# Patient Record
Sex: Female | Born: 1940 | Race: White | Hispanic: No | Marital: Married | Smoking: Never smoker
Health system: Southern US, Community
[De-identification: ages and names within clinical notes are randomized; demographics above are authoritative.]

## PROBLEM LIST (undated history)

## (undated) DIAGNOSIS — T8859XA Other complications of anesthesia, initial encounter: Secondary | ICD-10-CM

## (undated) DIAGNOSIS — I499 Cardiac arrhythmia, unspecified: Secondary | ICD-10-CM

## (undated) DIAGNOSIS — J45909 Unspecified asthma, uncomplicated: Secondary | ICD-10-CM

## (undated) DIAGNOSIS — C3490 Malignant neoplasm of unspecified part of unspecified bronchus or lung: Secondary | ICD-10-CM

## (undated) DIAGNOSIS — I10 Essential (primary) hypertension: Secondary | ICD-10-CM

## (undated) DIAGNOSIS — I4891 Unspecified atrial fibrillation: Secondary | ICD-10-CM

## (undated) DIAGNOSIS — I509 Heart failure, unspecified: Secondary | ICD-10-CM

## (undated) DIAGNOSIS — Z9049 Acquired absence of other specified parts of digestive tract: Secondary | ICD-10-CM

## (undated) DIAGNOSIS — Z90710 Acquired absence of both cervix and uterus: Secondary | ICD-10-CM

## (undated) DIAGNOSIS — Z9989 Dependence on other enabling machines and devices: Secondary | ICD-10-CM

## (undated) DIAGNOSIS — K219 Gastro-esophageal reflux disease without esophagitis: Secondary | ICD-10-CM

## (undated) DIAGNOSIS — R413 Other amnesia: Secondary | ICD-10-CM

---

## 2004-06-09 ENCOUNTER — Ambulatory Visit: Payer: Self-pay | Admitting: Internal Medicine

## 2005-01-12 ENCOUNTER — Ambulatory Visit: Payer: Self-pay | Admitting: Internal Medicine

## 2005-01-15 ENCOUNTER — Ambulatory Visit: Payer: Self-pay | Admitting: Internal Medicine

## 2005-01-22 ENCOUNTER — Ambulatory Visit: Payer: Self-pay | Admitting: Internal Medicine

## 2005-01-29 ENCOUNTER — Ambulatory Visit: Payer: Self-pay | Admitting: Oncology

## 2005-02-05 DIAGNOSIS — C3491 Malignant neoplasm of unspecified part of right bronchus or lung: Secondary | ICD-10-CM

## 2005-02-05 HISTORY — DX: Malignant neoplasm of unspecified part of right bronchus or lung: C34.91

## 2005-02-09 ENCOUNTER — Ambulatory Visit: Payer: Self-pay | Admitting: Oncology

## 2005-02-17 ENCOUNTER — Ambulatory Visit: Payer: Self-pay | Admitting: Oncology

## 2005-03-07 ENCOUNTER — Ambulatory Visit: Payer: Self-pay | Admitting: Oncology

## 2005-04-07 ENCOUNTER — Ambulatory Visit: Payer: Self-pay | Admitting: Oncology

## 2005-05-07 ENCOUNTER — Ambulatory Visit: Payer: Self-pay | Admitting: Oncology

## 2005-06-07 ENCOUNTER — Ambulatory Visit: Payer: Self-pay | Admitting: Oncology

## 2005-07-02 ENCOUNTER — Ambulatory Visit: Payer: Self-pay

## 2005-07-08 ENCOUNTER — Ambulatory Visit: Payer: Self-pay | Admitting: Oncology

## 2005-09-22 ENCOUNTER — Ambulatory Visit: Payer: Self-pay | Admitting: Oncology

## 2005-10-05 ENCOUNTER — Ambulatory Visit: Payer: Self-pay | Admitting: Oncology

## 2005-12-16 ENCOUNTER — Ambulatory Visit: Payer: Self-pay | Admitting: Oncology

## 2005-12-22 ENCOUNTER — Ambulatory Visit: Payer: Self-pay | Admitting: Oncology

## 2006-01-05 ENCOUNTER — Ambulatory Visit: Payer: Self-pay | Admitting: Oncology

## 2006-04-04 ENCOUNTER — Ambulatory Visit: Payer: Self-pay | Admitting: Unknown Physician Specialty

## 2006-04-14 ENCOUNTER — Ambulatory Visit: Payer: Self-pay | Admitting: Oncology

## 2006-06-30 ENCOUNTER — Ambulatory Visit: Payer: Self-pay | Admitting: Internal Medicine

## 2006-08-29 ENCOUNTER — Ambulatory Visit: Payer: Self-pay | Admitting: Oncology

## 2006-09-06 ENCOUNTER — Ambulatory Visit: Payer: Self-pay | Admitting: Oncology

## 2006-10-06 ENCOUNTER — Ambulatory Visit: Payer: Self-pay | Admitting: Oncology

## 2006-10-10 ENCOUNTER — Ambulatory Visit: Payer: Self-pay | Admitting: Radiation Oncology

## 2006-11-06 ENCOUNTER — Ambulatory Visit: Payer: Self-pay | Admitting: Oncology

## 2006-12-29 ENCOUNTER — Ambulatory Visit: Payer: Self-pay | Admitting: Oncology

## 2007-01-06 ENCOUNTER — Ambulatory Visit: Payer: Self-pay | Admitting: Oncology

## 2007-01-18 ENCOUNTER — Ambulatory Visit: Payer: Self-pay | Admitting: Oncology

## 2007-02-06 ENCOUNTER — Ambulatory Visit: Payer: Self-pay | Admitting: Oncology

## 2007-04-08 ENCOUNTER — Ambulatory Visit: Payer: Self-pay | Admitting: Radiation Oncology

## 2007-04-12 ENCOUNTER — Ambulatory Visit: Payer: Self-pay | Admitting: Radiation Oncology

## 2007-05-08 ENCOUNTER — Ambulatory Visit: Payer: Self-pay | Admitting: Radiation Oncology

## 2007-06-08 ENCOUNTER — Ambulatory Visit: Payer: Self-pay | Admitting: Oncology

## 2007-07-04 ENCOUNTER — Ambulatory Visit: Payer: Self-pay | Admitting: Internal Medicine

## 2007-07-09 ENCOUNTER — Ambulatory Visit: Payer: Self-pay | Admitting: Oncology

## 2007-07-19 ENCOUNTER — Ambulatory Visit: Payer: Self-pay | Admitting: Oncology

## 2007-08-06 ENCOUNTER — Ambulatory Visit: Payer: Self-pay | Admitting: Oncology

## 2007-10-10 ENCOUNTER — Ambulatory Visit: Payer: Self-pay | Admitting: Radiation Oncology

## 2007-11-06 ENCOUNTER — Ambulatory Visit: Payer: Self-pay | Admitting: Radiation Oncology

## 2007-12-06 ENCOUNTER — Ambulatory Visit: Payer: Self-pay | Admitting: Oncology

## 2007-12-20 ENCOUNTER — Ambulatory Visit: Payer: Self-pay | Admitting: Oncology

## 2008-01-06 ENCOUNTER — Ambulatory Visit: Payer: Self-pay | Admitting: Oncology

## 2008-01-24 ENCOUNTER — Ambulatory Visit: Payer: Self-pay | Admitting: Oncology

## 2008-02-06 ENCOUNTER — Ambulatory Visit: Payer: Self-pay | Admitting: Oncology

## 2008-07-05 ENCOUNTER — Ambulatory Visit: Payer: Self-pay | Admitting: Internal Medicine

## 2008-07-08 ENCOUNTER — Ambulatory Visit: Payer: Self-pay | Admitting: Oncology

## 2008-07-24 ENCOUNTER — Ambulatory Visit: Payer: Self-pay | Admitting: Oncology

## 2008-08-05 ENCOUNTER — Ambulatory Visit: Payer: Self-pay | Admitting: Oncology

## 2008-10-05 ENCOUNTER — Ambulatory Visit: Payer: Self-pay | Admitting: Radiation Oncology

## 2008-10-24 ENCOUNTER — Ambulatory Visit: Payer: Self-pay | Admitting: Oncology

## 2008-11-05 ENCOUNTER — Ambulatory Visit: Payer: Self-pay | Admitting: Radiation Oncology

## 2009-07-08 ENCOUNTER — Ambulatory Visit: Payer: Self-pay | Admitting: Oncology

## 2009-07-16 ENCOUNTER — Ambulatory Visit: Payer: Self-pay | Admitting: Oncology

## 2009-07-24 ENCOUNTER — Ambulatory Visit: Payer: Self-pay | Admitting: Internal Medicine

## 2009-08-05 ENCOUNTER — Ambulatory Visit: Payer: Self-pay | Admitting: Oncology

## 2009-10-05 ENCOUNTER — Ambulatory Visit: Payer: Self-pay | Admitting: Oncology

## 2009-10-20 ENCOUNTER — Ambulatory Visit: Payer: Self-pay | Admitting: Oncology

## 2009-11-05 ENCOUNTER — Ambulatory Visit: Payer: Self-pay | Admitting: Oncology

## 2009-11-13 ENCOUNTER — Ambulatory Visit: Payer: Self-pay | Admitting: Oncology

## 2009-11-17 ENCOUNTER — Ambulatory Visit: Payer: Self-pay | Admitting: Oncology

## 2009-12-05 ENCOUNTER — Ambulatory Visit: Payer: Self-pay | Admitting: Oncology

## 2010-08-06 ENCOUNTER — Ambulatory Visit: Payer: Self-pay | Admitting: Internal Medicine

## 2010-11-20 ENCOUNTER — Ambulatory Visit: Payer: Self-pay | Admitting: Oncology

## 2010-12-06 ENCOUNTER — Ambulatory Visit: Payer: Self-pay | Admitting: Oncology

## 2011-08-30 ENCOUNTER — Ambulatory Visit: Payer: Self-pay | Admitting: Internal Medicine

## 2011-12-01 ENCOUNTER — Ambulatory Visit: Payer: Self-pay | Admitting: Oncology

## 2011-12-01 LAB — CBC CANCER CENTER
Basophil %: 0.4 %
Eosinophil #: 0.1 x10 3/mm (ref 0.0–0.7)
Eosinophil %: 0.9 %
HGB: 12.6 g/dL (ref 12.0–16.0)
Lymphocyte %: 12.8 %
MCH: 28.7 pg (ref 26.0–34.0)
MCHC: 33.1 g/dL (ref 32.0–36.0)
MCV: 87 fL (ref 80–100)
Monocyte #: 0.4 x10 3/mm (ref 0.2–0.9)
Neutrophil #: 5.7 x10 3/mm (ref 1.4–6.5)
Neutrophil %: 80.1 %
RBC: 4.4 10*6/uL (ref 3.80–5.20)
RDW: 14 % (ref 11.5–14.5)
WBC: 7.1 x10 3/mm (ref 3.6–11.0)

## 2011-12-01 LAB — COMPREHENSIVE METABOLIC PANEL
Albumin: 3.8 g/dL (ref 3.4–5.0)
Alkaline Phosphatase: 77 U/L (ref 50–136)
Anion Gap: 9 (ref 7–16)
BUN: 10 mg/dL (ref 7–18)
Bilirubin,Total: 1.1 mg/dL — ABNORMAL HIGH (ref 0.2–1.0)
Calcium, Total: 9.1 mg/dL (ref 8.5–10.1)
EGFR (Non-African Amer.): >60
Glucose: 103 mg/dL — ABNORMAL HIGH (ref 65–99)
Potassium: 4.2 mmol/L (ref 3.5–5.1)
SGOT(AST): 14 U/L — ABNORMAL LOW (ref 15–37)
SGPT (ALT): 20 U/L
Sodium: 140 mmol/L (ref 136–145)
Total Protein: 6.9 g/dL (ref 6.4–8.2)

## 2011-12-06 ENCOUNTER — Ambulatory Visit: Payer: Self-pay | Admitting: Oncology

## 2012-01-06 ENCOUNTER — Ambulatory Visit: Payer: Self-pay | Admitting: Oncology

## 2012-05-01 ENCOUNTER — Ambulatory Visit: Payer: Self-pay | Admitting: Unknown Physician Specialty

## 2012-05-03 LAB — PATHOLOGY REPORT

## 2012-08-30 ENCOUNTER — Ambulatory Visit: Payer: Self-pay

## 2012-12-05 ENCOUNTER — Ambulatory Visit: Payer: Self-pay | Admitting: Oncology

## 2012-12-20 LAB — COMPREHENSIVE METABOLIC PANEL
Alkaline Phosphatase: 77 U/L (ref 50–136)
BUN: 14 mg/dL (ref 7–18)
Calcium, Total: 9 mg/dL (ref 8.5–10.1)
Chloride: 104 mmol/L (ref 98–107)
Co2: 31 mmol/L (ref 21–32)
EGFR (African American): >60
Glucose: 105 mg/dL — ABNORMAL HIGH (ref 65–99)
SGOT(AST): 16 U/L (ref 15–37)
SGPT (ALT): 18 U/L (ref 12–78)
Sodium: 138 mmol/L (ref 136–145)

## 2012-12-20 LAB — CBC CANCER CENTER
Basophil #: 0 x10 3/mm (ref 0.0–0.1)
Basophil %: 0.5 %
Eosinophil %: 0.7 %
HCT: 37.1 % (ref 35.0–47.0)
Lymphocyte #: 0.9 x10 3/mm — ABNORMAL LOW (ref 1.0–3.6)
Lymphocyte %: 9.2 %
MCH: 29.2 pg (ref 26.0–34.0)
MCHC: 34.6 g/dL (ref 32.0–36.0)
Monocyte %: 5.9 %
Neutrophil %: 83.7 %
Platelet: 251 x10 3/mm (ref 150–440)
RBC: 4.38 10*6/uL (ref 3.80–5.20)
RDW: 14.6 % — ABNORMAL HIGH (ref 11.5–14.5)

## 2013-01-05 ENCOUNTER — Ambulatory Visit: Payer: Self-pay | Admitting: Family Medicine

## 2013-01-05 ENCOUNTER — Ambulatory Visit: Payer: Self-pay | Admitting: Oncology

## 2013-09-06 ENCOUNTER — Ambulatory Visit: Payer: Self-pay

## 2013-10-30 DIAGNOSIS — R0681 Apnea, not elsewhere classified: Secondary | ICD-10-CM | POA: Insufficient documentation

## 2013-11-01 DIAGNOSIS — R0602 Shortness of breath: Secondary | ICD-10-CM | POA: Insufficient documentation

## 2013-11-20 DIAGNOSIS — R05 Cough: Secondary | ICD-10-CM | POA: Insufficient documentation

## 2013-11-20 DIAGNOSIS — R059 Cough, unspecified: Secondary | ICD-10-CM | POA: Insufficient documentation

## 2013-11-26 DIAGNOSIS — I340 Nonrheumatic mitral (valve) insufficiency: Secondary | ICD-10-CM | POA: Insufficient documentation

## 2014-01-09 ENCOUNTER — Ambulatory Visit: Payer: Self-pay | Admitting: Oncology

## 2014-01-09 LAB — COMPREHENSIVE METABOLIC PANEL
ALBUMIN: 3.6 g/dL (ref 3.4–5.0)
ALK PHOS: 76 U/L
Anion Gap: 5 — ABNORMAL LOW (ref 7–16)
BUN: 12 mg/dL (ref 7–18)
Bilirubin,Total: 1.2 mg/dL — ABNORMAL HIGH (ref 0.2–1.0)
Calcium, Total: 9.1 mg/dL (ref 8.5–10.1)
Chloride: 100 mmol/L (ref 98–107)
Co2: 31 mmol/L (ref 21–32)
Creatinine: 0.85 mg/dL (ref 0.60–1.30)
EGFR (Non-African Amer.): >60
GLUCOSE: 114 mg/dL — AB (ref 65–99)
Osmolality: 273 (ref 275–301)
Potassium: 4.4 mmol/L (ref 3.5–5.1)
SGOT(AST): 24 U/L (ref 15–37)
SGPT (ALT): 22 U/L
Sodium: 136 mmol/L (ref 136–145)
TOTAL PROTEIN: 7.4 g/dL (ref 6.4–8.2)

## 2014-01-09 LAB — CBC CANCER CENTER
Basophil #: 0.1 x10 3/mm (ref 0.0–0.1)
Basophil %: 0.7 %
Eosinophil #: 0.1 x10 3/mm (ref 0.0–0.7)
Eosinophil %: 0.7 %
HCT: 42.2 % (ref 35.0–47.0)
HGB: 14.1 g/dL (ref 12.0–16.0)
LYMPHS ABS: 1 x10 3/mm (ref 1.0–3.6)
Lymphocyte %: 10.8 %
MCH: 27.9 pg (ref 26.0–34.0)
MCHC: 33.5 g/dL (ref 32.0–36.0)
MCV: 84 fL (ref 80–100)
MONO ABS: 0.5 x10 3/mm (ref 0.2–0.9)
MONOS PCT: 5.8 %
NEUTROS PCT: 82 %
Neutrophil #: 7.4 x10 3/mm — ABNORMAL HIGH (ref 1.4–6.5)
Platelet: 251 x10 3/mm (ref 150–440)
RBC: 5.05 10*6/uL (ref 3.80–5.20)
RDW: 15.1 % — ABNORMAL HIGH (ref 11.5–14.5)
WBC: 9 x10 3/mm (ref 3.6–11.0)

## 2014-02-05 ENCOUNTER — Ambulatory Visit: Payer: Self-pay | Admitting: Oncology

## 2014-05-20 DIAGNOSIS — I482 Chronic atrial fibrillation, unspecified: Secondary | ICD-10-CM | POA: Insufficient documentation

## 2014-05-20 DIAGNOSIS — E782 Mixed hyperlipidemia: Secondary | ICD-10-CM | POA: Insufficient documentation

## 2014-09-12 ENCOUNTER — Ambulatory Visit
Admit: 2014-09-12 | Disposition: A | Discharge status: Home / Self Care | Payer: Self-pay | Attending: Infectious Diseases | Admitting: Infectious Diseases

## 2015-01-06 DIAGNOSIS — I10 Essential (primary) hypertension: Secondary | ICD-10-CM | POA: Insufficient documentation

## 2015-01-13 ENCOUNTER — Other Ambulatory Visit: Payer: Self-pay | Admitting: *Deleted

## 2015-01-13 ENCOUNTER — Ambulatory Visit
Admission: RE | Admit: 2015-01-13 | Discharge: 2015-01-13 | Disposition: A | Discharge status: Home / Self Care | Payer: Medicare Other | Source: Ambulatory Visit | Attending: Oncology | Admitting: Oncology

## 2015-01-13 ENCOUNTER — Inpatient Hospital Stay (HOSPITAL_BASED_OUTPATIENT_CLINIC_OR_DEPARTMENT_OTHER): Payer: Medicare Other | Admitting: Oncology

## 2015-01-13 ENCOUNTER — Inpatient Hospital Stay: Payer: Medicare Other | Attending: Oncology

## 2015-01-13 ENCOUNTER — Encounter: Payer: Self-pay | Admitting: Oncology

## 2015-01-13 VITALS — BP 112/79 | HR 60 | Temp 96.5°F | Wt 169.3 lb

## 2015-01-13 DIAGNOSIS — Z85118 Personal history of other malignant neoplasm of bronchus and lung: Secondary | ICD-10-CM | POA: Diagnosis not present

## 2015-01-13 DIAGNOSIS — C3490 Malignant neoplasm of unspecified part of unspecified bronchus or lung: Secondary | ICD-10-CM

## 2015-01-13 DIAGNOSIS — F329 Major depressive disorder, single episode, unspecified: Secondary | ICD-10-CM | POA: Insufficient documentation

## 2015-01-13 DIAGNOSIS — E785 Hyperlipidemia, unspecified: Secondary | ICD-10-CM | POA: Diagnosis not present

## 2015-01-13 DIAGNOSIS — F32A Depression, unspecified: Secondary | ICD-10-CM | POA: Insufficient documentation

## 2015-01-13 DIAGNOSIS — C3411 Malignant neoplasm of upper lobe, right bronchus or lung: Secondary | ICD-10-CM | POA: Diagnosis present

## 2015-01-13 DIAGNOSIS — R7303 Prediabetes: Secondary | ICD-10-CM | POA: Insufficient documentation

## 2015-01-13 DIAGNOSIS — Z79899 Other long term (current) drug therapy: Secondary | ICD-10-CM | POA: Insufficient documentation

## 2015-01-13 DIAGNOSIS — I4891 Unspecified atrial fibrillation: Secondary | ICD-10-CM | POA: Insufficient documentation

## 2015-01-13 DIAGNOSIS — I10 Essential (primary) hypertension: Secondary | ICD-10-CM | POA: Diagnosis not present

## 2015-01-13 DIAGNOSIS — F419 Anxiety disorder, unspecified: Secondary | ICD-10-CM | POA: Insufficient documentation

## 2015-01-13 DIAGNOSIS — I517 Cardiomegaly: Secondary | ICD-10-CM | POA: Diagnosis not present

## 2015-01-13 DIAGNOSIS — Z923 Personal history of irradiation: Secondary | ICD-10-CM | POA: Diagnosis not present

## 2015-01-13 DIAGNOSIS — R911 Solitary pulmonary nodule: Secondary | ICD-10-CM | POA: Insufficient documentation

## 2015-01-13 DIAGNOSIS — Z08 Encounter for follow-up examination after completed treatment for malignant neoplasm: Secondary | ICD-10-CM | POA: Diagnosis not present

## 2015-01-13 LAB — COMPREHENSIVE METABOLIC PANEL
ALK PHOS: 72 U/L (ref 38–126)
ALT: 12 U/L — AB (ref 14–54)
ANION GAP: 6 (ref 5–15)
AST: 18 U/L (ref 15–41)
Albumin: 4.1 g/dL (ref 3.5–5.0)
BUN: 15 mg/dL (ref 6–20)
CALCIUM: 8.6 mg/dL — AB (ref 8.9–10.3)
CO2: 26 mmol/L (ref 22–32)
Chloride: 100 mmol/L — ABNORMAL LOW (ref 101–111)
Creatinine, Ser: 0.74 mg/dL (ref 0.44–1.00)
GFR calc Af Amer: >60 mL/min (ref >60)
Glucose, Bld: 109 mg/dL — ABNORMAL HIGH (ref 65–99)
Potassium: 4.1 mmol/L (ref 3.5–5.1)
SODIUM: 132 mmol/L — AB (ref 135–145)
Total Bilirubin: 0.8 mg/dL (ref 0.3–1.2)
Total Protein: 6.8 g/dL (ref 6.5–8.1)

## 2015-01-13 LAB — CBC WITH DIFFERENTIAL/PLATELET
Basophils Absolute: 0 10*3/uL (ref 0–0.1)
Basophils Relative: 1 %
EOS PCT: 1 %
Eosinophils Absolute: 0.1 10*3/uL (ref 0–0.7)
HCT: 38.1 % (ref 35.0–47.0)
Hemoglobin: 13.1 g/dL (ref 12.0–16.0)
Lymphocytes Relative: 9 %
Lymphs Abs: 0.8 10*3/uL — ABNORMAL LOW (ref 1.0–3.6)
MCH: 28.8 pg (ref 26.0–34.0)
MCHC: 34.4 g/dL (ref 32.0–36.0)
MCV: 83.6 fL (ref 80.0–100.0)
Monocytes Absolute: 0.6 10*3/uL (ref 0.2–0.9)
Monocytes Relative: 7 %
Neutro Abs: 7.5 10*3/uL — ABNORMAL HIGH (ref 1.4–6.5)
Neutrophils Relative %: 82 %
PLATELETS: 276 10*3/uL (ref 150–440)
RBC: 4.56 MIL/uL (ref 3.80–5.20)
RDW: 14.6 % — ABNORMAL HIGH (ref 11.5–14.5)
WBC: 9.1 10*3/uL (ref 3.6–11.0)

## 2015-01-13 NOTE — Progress Notes (Signed)
Patient does not have living will. Never smoked. Patient here for CXR results.

## 2015-01-17 ENCOUNTER — Ambulatory Visit
Admission: RE | Admit: 2015-01-17 | Discharge: 2015-01-17 | Disposition: A | Discharge status: Home / Self Care | Payer: Medicare Other | Source: Ambulatory Visit | Attending: Oncology | Admitting: Oncology

## 2015-01-17 DIAGNOSIS — Z08 Encounter for follow-up examination after completed treatment for malignant neoplasm: Secondary | ICD-10-CM | POA: Diagnosis present

## 2015-01-17 DIAGNOSIS — Z85118 Personal history of other malignant neoplasm of bronchus and lung: Secondary | ICD-10-CM | POA: Diagnosis present

## 2015-01-17 DIAGNOSIS — D739 Disease of spleen, unspecified: Secondary | ICD-10-CM | POA: Diagnosis not present

## 2015-01-17 DIAGNOSIS — C3490 Malignant neoplasm of unspecified part of unspecified bronchus or lung: Secondary | ICD-10-CM

## 2015-01-17 DIAGNOSIS — R918 Other nonspecific abnormal finding of lung field: Secondary | ICD-10-CM | POA: Insufficient documentation

## 2015-01-19 NOTE — Progress Notes (Signed)
Jody [name] @ [facility] [phone]  [phone]     Jody [name] OB: [birth year]  MR#: [id number]  YYT#:[id number]  Patient Care Team: Adrian Prows, MD as PCP - General (Infectious Diseases)  CHIEF COMPLAINT:  Chief Complaint  Patient presents with  . Follow-up   Chief Complaint/Problem List:   Carcinoma of lung, status post radiation therapy. Adenocarcinoma, T1N0M0. Stage I. September 2006     INTERVAL HISTORY: 74 year old lady with a history of carcinoma of lung came today for further follow-up.  Patient had a chest x-ray done.  No cough or shortness of breath patient does not smoke patient also has bronchial asthma and recently diagnosed.  Appetite has been stable.  No nausea.  No vomiting.  No hemoptysis.  REVIEW OF SYSTEMS:  GENERAL:  Feels good.  Active.  No fevers, sweats or weight loss. PERFORMANCE STATUS (ECOG):  01 HEENT:  No visual changes, runny nose, sore throat, mouth sores or tenderness. Lungs: No shortness of breath or cough.  No hemoptysis. Cardiac:  No chest pain, palpitations, orthopnea, or PND. GI:  No nausea, vomiting, diarrhea, constipation, melena or hematochezia. GU:  No urgency, frequency, dysuria, or hematuria. Musculoskeletal:  No back pain.  No joint pain.  No muscle tenderness. Extremities:  No pain or swelling. Skin:  No rashes or skin changes. Neuro:  No headache, numbness or weakness, balance or coordination issues. Endocrine:  No diabetes, thyroid issues, hot flashes or night sweats. Psych:  No mood changes, depression or anxiety. Pain:  No focal pain. Review of systems:  All other systems reviewed and found to be negative. As per HPI. Otherwise, a complete review of systems is negatve.  Significant History/PMH:   reflux esophagitis:    anxiety depressive syndrome:    Cancer, Lung: Sep 2006   Hypertension:    Hyperlipidemia:    nissen fundoplication:   Smoking History: Smoking History Never  Smoked.(1)  PFSH: Additional Past Medical and Surgical History: Allergies:  Penicillin causing rash    Past Medical History:  Hiatal hernia  Bronchitis  Hyperlipidemia  Anxiety depressive syndrome    Past Surgical History:  Appendectomy 1970  Cholecystectomy 1980  Reflux esophagitis status post Nissen fundoplication.  Ovarian cyst surgery in the past related to sterility.    Family History:  History of breast cancer with probable metastatic disease to liver. History of heart disease.    Social History:  No alcohol use. No tobacco use. No radiation exposure. No recreational drug use.   Has never smoked in the past.   ADVANCED DIRECTIVES:  Patient does not have any living will or healthcare power of attorney.  Information was given .  Available resources had been discussed.  We will follow-up on subsequent appointments regarding this issue HEALTH MAINTENANCE: Social History  Substance Use Topics  . Smoking status: Never Smoker   . Smokeless tobacco: None  . Alcohol Use: None      Allergies  Allergen Reactions  . Extract Of [facility] Other (See Comments)    blistering  . Ampicillin Rash  . Penicillin V Potassium Rash      OBJECTIVE:  Filed Vitals:   01/13/15 1029  BP: 112/79  Pulse: 60  Temp: 96.5 F (35.8 C)     There is no height on file to calculate BMI.    ECOG FS:1 - Symptomatic but completely ambulatory  PHYSICAL EXAM: GENERAL:  Well developed, well nourished, sitting comfortably in the exam room in no acute distress. MENTAL STATUS:  Alert  and oriented to person, place and time.  ENT:  Oropharynx clear without lesion.  Tongue normal. Mucous membranes moist.  RESPIRATORY:  Clear to auscultation without rales, wheezes or rhonchi. CARDIOVASCULAR:  Regular rate and rhythm without murmur, rub or gallop. BREAST:  Right breast without masses, skin changes or nipple discharge.  Left breast without masses, skin changes or nipple discharge. ABDOMEN:  Soft,  non-tender, with active bowel sounds, and no hepatosplenomegaly.  No masses. BACK:  No CVA tenderness.  No tenderness on percussion of the back or rib cage. SKIN:  No rashes, ulcers or lesions. EXTREMITIES: No edema, no skin discoloration or tenderness.  No palpable cords. LYMPH NODES: No palpable cervical, supraclavicular, axillary or inguinal adenopathy  NEUROLOGICAL: Unremarkable. PSYCH:  Appropriate.   LAB RESULTS:  CBC Latest Ref Rng 01/13/2015 01/09/2014  WBC 3.6 - 11.0 K/uL 9.1 9.0  Hemoglobin 12.0 - 16.0 g/dL 13.1 14.1  Hematocrit 35.0 - 47.0 % 38.1 42.2  Platelets 150 - 440 K/uL 276 251    Appointment on 01/13/2015  Component Date Value Ref Range Status  . WBC 01/13/2015 9.1  3.6 - 11.0 K/uL Final  . RBC 01/13/2015 4.56  3.80 - 5.20 MIL/uL Final  . Hemoglobin 01/13/2015 13.1  12.0 - 16.0 g/dL Final  . HCT 01/13/2015 38.1  35.0 - 47.0 % Final  . MCV 01/13/2015 83.6  80.0 - 100.0 fL Final  . MCH 01/13/2015 28.8  26.0 - 34.0 pg Final  . MCHC 01/13/2015 34.4  32.0 - 36.0 g/dL Final  . RDW 01/13/2015 14.6* 11.5 - 14.5 % Final  . Platelets 01/13/2015 276  150 - 440 K/uL Final  . Neutrophils Relative % 01/13/2015 82   Final  . Neutro Abs 01/13/2015 7.5* 1.4 - 6.5 K/uL Final  . Lymphocytes Relative 01/13/2015 9   Final  . Lymphs Abs 01/13/2015 0.8* 1.0 - 3.6 K/uL Final  . Monocytes Relative 01/13/2015 7   Final  . Monocytes Absolute 01/13/2015 0.6  0.2 - 0.9 K/uL Final  . Eosinophils Relative 01/13/2015 1   Final  . Eosinophils Absolute 01/13/2015 0.1  0 - 0.7 K/uL Final  . Basophils Relative 01/13/2015 1   Final  . Basophils Absolute 01/13/2015 0.0  0 - 0.1 K/uL Final  . Sodium 01/13/2015 132* 135 - 145 mmol/L Final  . Potassium 01/13/2015 4.1  3.5 - 5.1 mmol/L Final  . Chloride 01/13/2015 100* 101 - 111 mmol/L Final  . CO2 01/13/2015 26  22 - 32 mmol/L Final  . Glucose, Bld 01/13/2015 109* 65 - 99 mg/dL Final  . BUN 01/13/2015 15  6 - 20 mg/dL Final  . Creatinine, Ser  01/13/2015 0.74  0.44 - 1.00 mg/dL Final  . Calcium 01/13/2015 8.6* 8.9 - 10.3 mg/dL Final  . Total Protein 01/13/2015 6.8  6.5 - 8.1 g/dL Final  . Albumin 01/13/2015 4.1  3.5 - 5.0 g/dL Final  . AST 01/13/2015 18  15 - 41 U/L Final  . ALT 01/13/2015 12* 14 - 54 U/L Final  . Alkaline Phosphatase 01/13/2015 72  38 - 126 U/L Final  . Total Bilirubin 01/13/2015 0.8  0.3 - 1.2 mg/dL Final  . GFR calc non Af Amer 01/13/2015 >60  >60 mL/min Final  . GFR calc Af Amer 01/13/2015 >60  >60 mL/min Final   Comment: (NOTE) The eGFR has been calculated using the CKD EPI equation. This calculation has not been validated in all clinical situations. eGFR's persistently <60 mL/min signify possible Chronic Kidney Disease.   Marland Kitchen  Anion gap 01/13/2015 6  5 - 15 Final      STUDIES: Dg Chest 2 View  01/13/2015   CLINICAL DATA:  Malignant neoplasm of lung, 10 year follow-up. Atrial fibrillation.  EXAM: CHEST  2 VIEW  COMPARISON:  01/09/2014.  07/24/2008.  CT 11/13/2009.  FINDINGS: Mediastinum and hilar structures normal. Stable cardiomegaly. Stable right apical pleural parenchymal thickening noted consistent with scarring. A nodular density noted projected over the left mid lung field. This was not present on prior chest x-ray of 01/09/2014. Nodularity was noted in this region on prior chest CT of 11/13/2009. This could represent an new a recurrent pulmonary nodule. No pleural effusion or pneumothorax. Degenerative changes thoracic spine. Prior cholecystectomy. Nonenhanced chest CT suggest for further evaluation.  IMPRESSION: 1. Stable right apical pleural thickening noted consistent with scarring. No evidence of recurrent tumor in in this region. 2. Nodular opacity noted over the left mid lung field. This was not present on prior chest x-ray of 01/09/2014. This may have been present on prior CT of 11/13/2009. This nodule could be new or recurrent. Nonenhanced chest CT is suggested for further evaluation . 3. Stable  cardiomegaly.   Electronically Signed   By: Marcello Moores  Register   On: 01/13/2015 09:48   Ct Chest Wo Contrast  01/17/2015   CLINICAL DATA:  Subsequent encounter for lung cancer.  EXAM: CT CHEST WITHOUT CONTRAST  TECHNIQUE: Multidetector CT imaging of the chest was performed following the standard protocol without IV contrast.  COMPARISON:  12/17/2011.  FINDINGS: Mediastinum / Lymph Nodes: There is no axillary lymphadenopathy. No mediastinal lymphadenopathy. There is no hilar lymphadenopathy. Heart is enlarged. Coronary artery calcification is noted. Small to moderate hiatal hernia noted.  Lungs / Pleura: Post treatment changes in the central right upper lobe are stable. Multiple scattered bilateral pulmonary parenchymal nodules are unchanged in the interval. 5 mm nodule in the superior segment of the right lower lobe is new in the interval. 2 mm right lower lobe nodule seen on image 22 is also new. No pulmonary edema. No focal airspace consolidation. No evidence for pleural effusion.  MSK / Soft Tissues: Bone windows reveal no worrisome lytic or sclerotic osseous lesions.  Upper Abdomen:  Stable appearance of large calcified splenic lesion.  IMPRESSION: 1. Stable appearance of post treatment changes in the right upper lobe. 2. Scattered tiny bilateral pulmonary parenchymal nodules. Two nodules in the right lung are new in the interval, the larger of the 2 measuring 5 mm. If the patient is at high risk for bronchogenic carcinoma, follow-up chest CT at 6-12 months is recommended. If the patient is at low risk for bronchogenic carcinoma, follow-up chest CT at 12 months is recommended. This recommendation follows the consensus statement: Guidelines for Management of Small Pulmonary Nodules Detected on CT Scans: A Statement from the [employer] as published in Radiology 2005;237:395-400. 3. Stable appearance of large calcified splenic lesion, consistent with benign process.   Electronically Signed   By: Misty Stanley M.D.   On: 01/17/2015 10:54    ASSESSMENT: Adenocarcinoma of lung status postradiation therapy Patient had an abnormal chest x-ray which is reviewed this was followed by CT scan which revealed stable changes in the right upper lobe where patient had been treated with radiation.  Scattered tiny bilateral pulmonary parenchymal nodules 2 nodules in the right lungs are new and one is measuring 5 mm. Another CT scan with contrast has been recommended in 6-12 months  MEDICAL DECISION MAKING:  CT scan  and the chest x-rays are reviewed will arrange for another CT scan in 6 months and reevaluation after that for follow-up regarding lung nodule. Right upper lobe lung cancer, adenocarcinoma stage I status postradiation therapy  Patient expressed understanding and was in agreement with this plan. She also understands that She can call clinic at any time with any questions, concerns, or complaints.    No matching staging information was found for the patient.  Forest Gleason, MD   01/19/2015 4:52 PM

## 2015-01-20 ENCOUNTER — Telehealth: Payer: Self-pay | Admitting: *Deleted

## 2015-01-20 ENCOUNTER — Other Ambulatory Visit: Payer: Self-pay | Admitting: *Deleted

## 2015-01-20 DIAGNOSIS — R911 Solitary pulmonary nodule: Secondary | ICD-10-CM

## 2015-01-20 NOTE — Telephone Encounter (Signed)
Spoke with patient to inform her that MD is scheduling another CT in 6 mos to follow up with lung nodules. Patient verbalized understanding.

## 2015-01-20 NOTE — Telephone Encounter (Signed)
-----   Message from Forest Gleason, MD sent at 01/19/2015  5:02 PM EDT ----- Regarding: Follow-up CT scan We need to call her and let her know that there is small 5 mm right upper lobe lung nodule which initially followed by another CT scan in 6 months.  Please arrange for CT scan with contrast in 6 months and then return appointment to see me after CT scan is done.  If needed I cannot talk to her

## 2015-01-21 DIAGNOSIS — I071 Rheumatic tricuspid insufficiency: Secondary | ICD-10-CM | POA: Insufficient documentation

## 2015-02-10 ENCOUNTER — Emergency Department: Payer: Medicare Other

## 2015-02-10 ENCOUNTER — Emergency Department
Admission: EM | Admit: 2015-02-10 | Discharge: 2015-02-10 | Disposition: A | Discharge status: Home / Self Care | Payer: Medicare Other | Attending: Emergency Medicine | Admitting: Emergency Medicine

## 2015-02-10 ENCOUNTER — Encounter: Payer: Self-pay | Admitting: *Deleted

## 2015-02-10 DIAGNOSIS — I10 Essential (primary) hypertension: Secondary | ICD-10-CM | POA: Diagnosis not present

## 2015-02-10 DIAGNOSIS — R5383 Other fatigue: Secondary | ICD-10-CM | POA: Diagnosis present

## 2015-02-10 DIAGNOSIS — Z79899 Other long term (current) drug therapy: Secondary | ICD-10-CM | POA: Diagnosis not present

## 2015-02-10 DIAGNOSIS — I4891 Unspecified atrial fibrillation: Secondary | ICD-10-CM | POA: Insufficient documentation

## 2015-02-10 HISTORY — DX: Unspecified atrial fibrillation: I48.91

## 2015-02-10 HISTORY — DX: Malignant neoplasm of unspecified part of unspecified bronchus or lung: C34.90

## 2015-02-10 LAB — COMPREHENSIVE METABOLIC PANEL
ALK PHOS: 87 U/L (ref 38–126)
ALT: 33 U/L (ref 14–54)
ANION GAP: 13 (ref 5–15)
AST: 35 U/L (ref 15–41)
Albumin: 3.5 g/dL (ref 3.5–5.0)
BILIRUBIN TOTAL: 0.8 mg/dL (ref 0.3–1.2)
BUN: 16 mg/dL (ref 6–20)
CALCIUM: 8.9 mg/dL (ref 8.9–10.3)
CO2: 26 mmol/L (ref 22–32)
Chloride: 93 mmol/L — ABNORMAL LOW (ref 101–111)
Creatinine, Ser: 0.75 mg/dL (ref 0.44–1.00)
Glucose, Bld: 153 mg/dL — ABNORMAL HIGH (ref 65–99)
Potassium: 3.3 mmol/L — ABNORMAL LOW (ref 3.5–5.1)
Sodium: 132 mmol/L — ABNORMAL LOW (ref 135–145)
TOTAL PROTEIN: 7.2 g/dL (ref 6.5–8.1)

## 2015-02-10 LAB — URINALYSIS COMPLETE WITH MICROSCOPIC (ARMC ONLY)
BILIRUBIN URINE: NEGATIVE
Bacteria, UA: NONE SEEN
Glucose, UA: NEGATIVE mg/dL
Hgb urine dipstick: NEGATIVE
Leukocytes, UA: NEGATIVE
NITRITE: NEGATIVE
PH: 6 (ref 5.0–8.0)
Protein, ur: 30 mg/dL — AB
Specific Gravity, Urine: 1.016 (ref 1.005–1.030)

## 2015-02-10 LAB — CBC
HCT: 40.2 % (ref 35.0–47.0)
Hemoglobin: 13.6 g/dL (ref 12.0–16.0)
MCH: 28.2 pg (ref 26.0–34.0)
MCHC: 34 g/dL (ref 32.0–36.0)
MCV: 82.9 fL (ref 80.0–100.0)
Platelets: 242 K/uL (ref 150–440)
RBC: 4.84 MIL/uL (ref 3.80–5.20)
RDW: 14.4 % (ref 11.5–14.5)
WBC: 13.5 K/uL — ABNORMAL HIGH (ref 3.6–11.0)

## 2015-02-10 LAB — MAGNESIUM: MAGNESIUM: 2.5 mg/dL — AB (ref 1.7–2.4)

## 2015-02-10 LAB — BRAIN NATRIURETIC PEPTIDE: B Natriuretic Peptide: 485 pg/mL — ABNORMAL HIGH (ref 0.0–100.0)

## 2015-02-10 LAB — TROPONIN I: Troponin I: <0.03 ng/mL (ref <0.031)

## 2015-02-10 MED ORDER — DILTIAZEM HCL 25 MG/5ML IV SOLN
10.0000 mg | Freq: Once | INTRAVENOUS | Status: AC
  Administered 2015-02-10: 10 mg via INTRAVENOUS
  Filled 2015-02-10: qty 5

## 2015-02-10 MED ORDER — METOPROLOL SUCCINATE ER 50 MG PO TB24: 50.0000 mg | ORAL_TABLET | Freq: Every day | ORAL | Status: DC

## 2015-02-10 MED ORDER — SODIUM CHLORIDE 0.9 % IV BOLUS (SEPSIS)
500.0000 mL | Freq: Once | INTRAVENOUS | Status: AC
  Administered 2015-02-10: 500 mL via INTRAVENOUS

## 2015-02-10 MED ORDER — METOPROLOL TARTRATE 25 MG PO TABS
25.0000 mg | ORAL_TABLET | Freq: Once | ORAL | Status: AC
  Administered 2015-02-10: 25 mg via ORAL
  Filled 2015-02-10: qty 1

## 2015-02-10 MED ORDER — POTASSIUM CHLORIDE CRYS ER 20 MEQ PO TBCR
20.0000 meq | EXTENDED_RELEASE_TABLET | Freq: Once | ORAL | Status: AC
  Administered 2015-02-10: 20 meq via ORAL
  Filled 2015-02-10: qty 1

## 2015-02-10 NOTE — ED Notes (Signed)
Pt reports constipation for 1 week, lack of appetite and poor PO intake, pt sent her here from [facility]

## 2015-02-10 NOTE — ED Provider Notes (Signed)
Time Seen: Approximately 1150  I have reviewed the triage notes  Chief Complaint: Dehydration   History of Present Illness: Jody [name] is a 74 y.o. female who presents with generalized fatigue over the past week. Patient has a known history of atrial fibrillation. She denies any chest pain. She denies any focal weakness either upper or lower extremities. Patient states she felt somewhat lightheaded this morning and thought she might be dehydrated. She denies any arm job neck or flank discomfort. Patient states she has not had any recent adjustments of her medication and has not coming appointment with her cardiologist in early December. She states she hasn't had her normal morning medications at this time. The patient denies any abdominal painshe's had some constipation over the past week. She has noticed some shortness of breath with exertion on occasion. She denies any fever or chills or productive cough at home. She's noticed some mild increase and peripheral edema in both lower extremities. Past Medical History  Diagnosis Date  . Atrial fibrillation   . Lung cancer     Patient Active Problem List   Diagnosis Date Noted  . Anxiety 01/13/2015  . Clinical depression 01/13/2015  . 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 Benign essential HTN 01/06/2015  . Atrial fibrillation, chronic 05/20/2014  . 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 Cough 11/20/2013  . Breath shortness 11/01/2013  . Breathlessness on exertion 10/30/2013    History reviewed. No pertinent past surgical history.  History reviewed. No pertinent past surgical history.  Current Outpatient Rx  Name  Route  Sig  Dispense  Refill  . ALPRAZolam (XANAX) 0.25 MG tablet   Oral   Take by mouth.         Marland Kitchen apixaban (ELIQUIS) 5 MG TABS tablet   Oral   Take by mouth.         . diltiazem (CARDIZEM CD) 120 MG 24 hr capsule   Oral   Take by mouth.        Marland Kitchen FLUoxetine (PROZAC) 20 MG capsule   Oral   Take by mouth.         . furosemide (LASIX) 20 MG tablet   Oral   Take by mouth.         . lovastatin (MEVACOR) 20 MG tablet   Oral   Take by mouth.         . metoprolol succinate (TOPROL XL) 50 MG 24 hr tablet   Oral   Take 1 tablet (50 mg total) by mouth daily. Take with or immediately following a meal.   30 tablet   11   . montelukast (SINGULAIR) 10 MG tablet   Oral   Take by mouth.         . Multiple Vitamin (MULTI-VITAMINS) TABS   Oral   Take by mouth.         Marland Kitchen omeprazole (PRILOSEC) 20 MG capsule   Oral   Take by mouth.         . potassium chloride (K-DUR) 10 MEQ tablet   Oral   Take by mouth.           Allergies:  Extract of poison oak; Ampicillin; and Penicillin v potassium  Family History: No family history on file.  Social History: Social History  Substance Use Topics  . Smoking status: Never Smoker   . Smokeless tobacco: None  . Alcohol Use: No     Review of  Systems:   10 point review of systems was performed and was otherwise negative:  Constitutional: No fever Eyes: No visual disturbances ENT: No sore throat, ear pain Cardiac: No chest pain Respiratory: Shortness of breath with exertion, no wheezing, or stridor Abdomen: No abdominal pain, no vomiting, No diarrhea. Patient describes constipation Endocrine: No weight loss, No night sweats Extremities: Mild circumferential peripheral edema, cyanosis Skin: No rashes, easy bruising Neurologic: No focal weakness, trouble with speech or swollowing  Urologic: No dysuria, Hematuria, or urinary frequency   Physical Exam:  ED Triage Vitals  Enc Vitals Group     BP 02/10/15 1123 111/94 mmHg     Pulse Rate 02/10/15 1123 70     Resp 02/10/15 1123 20     Temp 02/10/15 1123 97.5 F (36.4 C)     Temp Source 02/10/15 1123 Oral     SpO2 02/10/15 1123 97 %     Weight 02/10/15 1123 162 lb (73.483 kg)     Height 02/10/15 1123 _0   (1.499 m)     Head Cir --      Peak Flow --      Pain Score --      Pain Loc --      Pain Edu? --      Excl. in [place]? --     General: Awake , Alert , and Oriented times 3; GCS 15 Head: Normal cephalic , atraumatic Eyes: Pupils equal , round, reactive to light Nose/Throat: No nasal drainage, patent upper airway without erythema or exudate.  Neck: Supple, Full range of motion, No anterior adenopathy or palpable thyroid masses Lungs: Clear to ascultation without wheezes , rhonchi, or rales Heart: Irregular rate, irregular rhythm without murmurs , gallops , or rubs Abdomen: Soft, non tender without rebound, guarding , or rigidity; bowel sounds positive and symmetric in all 4 quadrants. No organomegaly .        Extremities: 2 plus symmetric pulses. No pitting edema, clubbing or cyanosis Neurologic: normal ambulation, Motor symmetric without deficits, sensory intact Skin: warm, dry, no rashes   Labs:   All laboratory work was reviewed including any pertinent negatives or positives listed below:  Labs Reviewed  COMPREHENSIVE METABOLIC PANEL - Abnormal; Notable for the following:    Sodium 132 (*)    Potassium 3.3 (*)    Chloride 93 (*)    Glucose, Bld 153 (*)    All other components within normal limits  CBC - Abnormal; Notable for the following:    WBC 13.5 (*)    All other components within normal limits  URINALYSIS COMPLETEWITH MICROSCOPIC ([facility] ONLY) - Abnormal; Notable for the following:    Color, Urine YELLOW (*)    APPearance CLEAR (*)    Ketones, ur TRACE (*)    Protein, ur 30 (*)    Squamous Epithelial / LPF 0-5 (*)    All other components within normal limits  BRAIN NATRIURETIC PEPTIDE - Abnormal; Notable for the following:    B Natriuretic Peptide 485.0 (*)    All other components within normal limits  MAGNESIUM - Abnormal; Notable for the following:    Magnesium 2.5 (*)    All other components within normal limits  TROPONIN I   patient's BNP is slightly  elevated.  EKG:  ED ECG REPORT I, Daymon Larsen, the attending physician, personally viewed and interpreted this ECG.  Date: 02/10/2015 EKG Time: 1128 Rate: 147 Rhythm: Atrial fibrillation with rapid ventricular response QRS Axis: normal Intervals: normal  ST/T Wave abnormalities: Nonspecific ST and T-wave abnormalities possibly rate dependent Conduction Disutrbances: none Narrative Interpretation: No acute ischemic changes are noted    Radiology:     Chavon, Lucarelli #[id number] (74 y.o. F) [id number]        Lab Results       Comprehensive metabolic panel (Final result)Abnormal Component (Lab Inquiry)    Collection Time Result Time NA K CL CO2 GLUCOSE   02/10/15 11:43:00 02/10/15 12:10:38 132 (L) 3.3 (L) 93 (L) 26 153 (H)      Collection Time Result Time BUN Creatinine, Ser CALCIUM PROTEIN Albumin   02/10/15 11:43:00 02/10/15 12:10:38 16 0.75 8.9 7.2 3.5      Collection Time Result Time AST ALT ALK PHOS BILI TOTL GFR calc non Af Amer   02/10/15 11:43:00 02/10/15 12:10:38 35 33 87 0.8 >60      Collection Time Result Time GFR calc Af Amer Anion gap   02/10/15 11:43:00 02/10/15 12:10:38  >60    (Comment)    (NOTE)  The eGFR has been calculated using the CKD EPI equation.  This calculation has not been validated in all clinical situations.  eGFR's persistently <60 mL/min signify possible Chronic Kidney  Disease.       13             CBC (Final result)Abnormal Component (Lab Inquiry)    Collection Time Result Time WBC RBC HGB HCT MCV   02/10/15 11:43:00 02/10/15 11:56:07 13.5 (H) 4.84 13.6 40.2 82.9      Collection Time Result Time MCH MCHC RDW PLT   02/10/15 11:43:00 02/10/15 11:56:07 28.2 34.0 14.4 242             Urinalysis complete, with microscopic ([facility] only) (Final result)Abnormal Component (Lab Inquiry)    Collection Time Result Time Color, Urine APPearance GLUCOSE U BILI UR Ketones, ur   02/10/15 11:43:00  02/10/15 12:00:30 YELLOW (A) CLEAR (A) NEGATIVE NEGATIVE TRACE (A)      Collection Time Result Time Specific Gravity, Urine Hgb urine dipstick pH Protein, ur Nitrite   02/10/15 11:43:00 02/10/15 12:00:30 1.016 NEGATIVE 6.0 30 (A) NEGATIVE      Collection Time Result Time LEUKOCYTES RBC / HPF WBC U BACTERIA Squamous Epithelial / LPF   02/10/15 11:43:00 02/10/15 12:00:30 NEGATIVE 0-5 0-5 NONE SEEN 0-5 (A)      Collection Time Result Time Mucous Hyaline Casts, UA   02/10/15 11:43:00 02/10/15 12:00:30 PRESENT PRESENT             Troponin I (Final result) Component (Lab Inquiry)    Collection Time Result Time TROPONIN I   02/10/15 11:43:00 02/10/15 12:31:55 <0.03 NO INDICATION OF MYOCARDIAL INJURY.              Brain natriuretic peptide (Final result)Abnormal Component (Lab Inquiry)    Collection Time Result Time B Natriuretic Peptide   02/10/15 11:43:00 02/10/15 12:34:14 485.0 (H)             Magnesium (Final result)Abnormal Component (Lab Inquiry)    Collection Time Result Time MG   02/10/15 11:43:00 02/10/15 12:42:58 2.5 (H)           Imaging Results       DG Abd 1 View (Final result) Result time: 02/10/15 13:30:12   Final result by Rad Results In Interface (02/10/15 13:30:12)   Narrative:   CLINICAL DATA: 74 year old female with history of atrial fibrillation during constipation. Last normal bowel movement was 1 week ago.  EXAM: ABDOMEN -  1 VIEW  COMPARISON: No priors.  FINDINGS: Gas and stool are seen scattered throughout the colon extending to the level of the distal rectum. No pathologic distension of small bowel is noted. No gross evidence of pneumoperitoneum. Stool burden does not appear excessive. Surgical clips project over the right upper quadrant of the abdomen, compatible with prior cholecystectomy. Numerous pelvic phleboliths. Numerous calcifications project over the left upper quadrant,  presumably within the previously noted splenic lesion.  IMPRESSION: 1. Nonobstructive bowel gas pattern. 2. No pneumoperitoneum.   Electronically Signed By: Vinnie Langton M.D. On: 02/10/2015 13:30          DG Chest Portable 1 View (Final result) Result time: 02/10/15 12:27:42   Final result by Rad Results In Interface (02/10/15 12:27:42)   Narrative:   CLINICAL DATA: 74 year old female with constipation for 1 week. Lack of appetite. Poor p.o. intake.  EXAM: PORTABLE CHEST - 1 VIEW  COMPARISON: Chest x-ray 01/13/2015.  FINDINGS: Linear area of scarring in the right upper lobe, similar to prior examinations. No acute consolidative airspace disease. No pleural effusions. No definite suspicious appearing pulmonary nodules or masses. Cephalization of the pulmonary vasculature, without frank pulmonary edema. Mild cardiomegaly. The patient is rotated to the left on today's exam, resulting in distortion of the mediastinal contours and reduced diagnostic sensitivity and specificity for mediastinal pathology. Atherosclerosis in the thoracic aorta.  IMPRESSION: 1. Cardiomegaly with pulmonary venous congestion, but no frank pulmonary edema. 2. Postprocedural changes from prior radiation therapy with mild scarring in the right upper lobe, similar to prior examinations. 3. Atherosclerosis.   Electronically Signed By: Vinnie Langton M.D. On: 02/10/2015 12:27 EXAM: PORTABLE CHEST - 1 VIEW  COMPARISON: Chest x-ray 01/13/2015.  FINDINGS: Linear area of scarring in the right upper lobe, similar to prior examinations. No acute consolidative airspace disease. No pleural effusions. No definite suspicious appearing pulmonary nodules or masses. Cephalization of the pulmonary vasculature, without frank pulmonary edema. Mild cardiomegaly. The patient is rotated to the left on today's exam, resulting in distortion of the mediastinal contours and reduced diagnostic  sensitivity and specificity for mediastinal pathology. Atherosclerosis in the thoracic aorta.  IMPRESSION: 1. Cardiomegaly with pulmonary venous congestion, but no frank pulmonary edema. 2. Postprocedural changes from prior radiation therapy with mild scarring in the right upper lobe, similar to prior examinations. 3. Atherosclerosis.            I personally reviewed the radiologic studies   ED course Patient was placed on a continuous monitor along with serial blood pressures. Please see nursing notes. Felt the patient had high output pulmonary edema given her rapid rate with her atrial fibrillation. The patient received first aid Cardizem bolus which did decrease her heart rate and then had on top of that some oral Lopressor here in emergency department. Patient's heart rate gradually decreased into the low 100s to upper 90s. She states she feels symptomatically improved. I felt that her pulmonary edema would correct after slowing her heart rate to make her heart more efficient. The patient's pulse oximetry and blood pressures remained stable. She was advised contact her cardiologist in the morning for urgent follow-up and had her Lopressor increased from 25 mg to 50 mg of Toprol XL. Patient's been advised continue all her other medications. Her constipation may be secondary to the calcium channel blocker therapy. Otherwise, she doesn't appear to have a bowel obstruction or a lot of stool in the rectal vault and I felt over-the-counter management of her constipation would be adequate. She  is advised to return here if she develops a fever, chest pain, increased shortness of breath, or any other new concerns. She was discharged home with her husband and is presently discussed discharge instructions.   Critical Care: CRITICAL CARE Performed by: Daymon Larsen   Total critical care time: 35 minutes  Critical care time was exclusive of separately billable procedures and treating other  patients.  Critical care was necessary to treat or prevent imminent or life-threatening deterioration.  Critical care was time spent personally by me on the following activities: development of treatment plan with patient and/or surrogate as well as nursing, discussions with consultants, evaluation of patient's response to treatment, examination of patient, obtaining history from patient or surrogate, ordering and performing treatments and interventions, ordering and review of laboratory studies, ordering and review of radiographic studies, pulse oximetry and re-evaluation of patient's condition. Administration of multiple medications for rate control on atrial fibrillation with associated pulmonary edema     Assessment:  Atrial fibrillation with rapid ventricular rate Chronic atrial fibrillation Mild pulmonary edema Constipation   Final Clinical Impression:  Final diagnoses:  Atrial fibrillation with tachycardic ventricular rate     Plan:  Outpatient management. Patient was given pulmonary edema instructions advised continue with her current medication return here for condition worsens.          Daymon Larsen, MD 02/10/15 [phone]

## 2015-02-10 NOTE — Discharge Instructions (Signed)
Atrial Fibrillation °Atrial fibrillation is a type of irregular heart rhythm (arrhythmia). During atrial fibrillation, the upper chambers of the heart (atria) quiver continuously in a chaotic pattern. This causes an irregular and often rapid heart rate.  °Atrial fibrillation is the result of the heart becoming overloaded with disorganized signals that tell it to beat. These signals are normally released one at a time by a part of the right atrium called the sinoatrial node. They then travel from the atria to the lower chambers of the heart (ventricles), causing the atria and ventricles to contract and pump blood as they pass. In atrial fibrillation, parts of the atria outside of the sinoatrial node also release these signals. This results in two problems. First, the atria receive so many signals that they do not have time to fully contract. Second, the ventricles, which can only receive one signal at a time, beat irregularly and out of rhythm with the atria.  °There are three types of atrial fibrillation:  °· Paroxysmal. Paroxysmal atrial fibrillation starts suddenly and stops on its own within a week. °· Persistent. Persistent atrial fibrillation lasts for more than a week. It may stop on its own or with treatment. °· Permanent. Permanent atrial fibrillation does not go away. Episodes of atrial fibrillation may lead to permanent atrial fibrillation. °Atrial fibrillation can prevent your heart from pumping blood normally. It increases your risk of stroke and can lead to heart failure.  °CAUSES  °· Heart conditions, including a heart attack, heart failure, coronary artery disease, and heart valve conditions.   °· Inflammation of the sac that surrounds the heart (pericarditis). °· Blockage of an artery in the lungs (pulmonary embolism). °· Pneumonia or other infections. °· Chronic lung disease. °· Thyroid problems, especially if the thyroid is overactive (hyperthyroidism). °· Caffeine, excessive alcohol use, and use  of some illegal drugs.   °· Use of some medicines, including certain decongestants and diet pills. °· Heart surgery.   °· Birth defects.   °Sometimes, no cause can be found. When this happens, the atrial fibrillation is called lone atrial fibrillation. The risk of complications from atrial fibrillation increases if you have lone atrial fibrillation and you are age 60 years or older. °RISK FACTORS °· Heart failure. °· Coronary artery disease. °· Diabetes mellitus.   °· High blood pressure (hypertension).   °· Obesity.   °· Other arrhythmias.   °· Increased age. °SIGNS AND SYMPTOMS  °· A feeling that your heart is beating rapidly or irregularly.   °· A feeling of discomfort or pain in your chest.   °· Shortness of breath.   °· Sudden light-headedness or weakness.   °· Getting tired easily when exercising.   °· Urinating more often than normal (mainly when atrial fibrillation first begins).   °In paroxysmal atrial fibrillation, symptoms may start and suddenly stop. °DIAGNOSIS  °Your health care provider may be able to detect atrial fibrillation when taking your pulse. Your health care provider may have you take a test called an ambulatory electrocardiogram (ECG). An ECG records your heartbeat patterns over a 24-hour period. You may also have other tests, such as: °· Transthoracic echocardiogram (TTE). During echocardiography, sound waves are used to evaluate how blood flows through your heart. °· Transesophageal echocardiogram (TEE). °· Stress test. There is more than one type of stress test. If a stress test is needed, ask your health care provider about which type is best for you. °· Chest X-ray exam. °· Blood tests. °· Computed tomography (CT). °TREATMENT  °Treatment may include: °· Treating any underlying conditions. For example, if you   have an overactive thyroid, treating the condition may correct atrial fibrillation.  Taking medicine. Medicines may be given to control a rapid heart rate or to prevent blood  clots, heart failure, or a stroke.  Having a procedure to correct the rhythm of the heart:  Electrical cardioversion. During electrical cardioversion, a controlled, low-energy shock is delivered to the heart through your skin. If you have chest pain, very low blood pressure, or sudden heart failure, this procedure may need to be done as an emergency.  Catheter ablation. During this procedure, heart tissues that send the signals that cause atrial fibrillation are destroyed.  Surgical ablation. During this surgery, thin lines of heart tissue that carry the abnormal signals are destroyed. This procedure can either be an open-heart surgery or a minimally invasive surgery. With the minimally invasive surgery, small cuts are made to access the heart instead of a large opening.  Pulmonary venous isolation. During this surgery, tissue around the veins that carry blood from the lungs (pulmonary veins) is destroyed. This tissue is thought to carry the abnormal signals. HOME CARE INSTRUCTIONS   Take medicines only as directed by your health care provider. Some medicines can make atrial fibrillation worse or recur.  If blood thinners were prescribed by your health care provider, take them exactly as directed. Too much blood-thinning medicine can cause bleeding. If you take too little, you will not have the needed protection against stroke and other problems.  Perform blood tests at home if directed by your health care provider. Perform blood tests exactly as directed.  Quit smoking if you smoke.  Do not drink alcohol.  Do not drink caffeinated beverages such as coffee, soda, and some teas. You may drink decaffeinated coffee, soda, or tea.   Maintain a healthy weight.Do not use diet pills unless your health care provider approves. They may make heart problems worse.   Follow diet instructions as directed by your health care provider.  Exercise regularly as directed by your health care  provider.  Keep all follow-up visits as directed by your health care provider. This is important. PREVENTION  The following substances can cause atrial fibrillation to recur:   Caffeinated beverages.  Alcohol.  Certain medicines, especially those used for breathing problems.  Certain herbs and herbal medicines, such as those containing ephedra or ginseng.  Illegal drugs, such as cocaine and amphetamines. Sometimes medicines are given to prevent atrial fibrillation from recurring. Proper treatment of any underlying condition is also important in helping prevent recurrence.  SEEK MEDICAL CARE IF:  You notice a change in the rate, rhythm, or strength of your heartbeat.  You suddenly begin urinating more frequently.  You tire more easily when exerting yourself or exercising. SEEK IMMEDIATE MEDICAL CARE IF:   You have chest pain, abdominal pain, sweating, or weakness.  You feel nauseous.  You have shortness of breath.  You suddenly have swollen feet and ankles.  You feel dizzy.  Your face or limbs feel numb or weak.  You have a change in your vision or speech. MAKE SURE YOU:   Understand these instructions.  Will watch your condition.  Will get help right away if you are not doing well or get worse. Document Released: 05/24/2005 Document Revised: 10/08/2013 Document Reviewed: 07/04/2012 [facility] Patient Information 2015 [place], [place]. This information is not intended to replace advice given to you by your health care provider. Make sure you discuss any questions you have with your health care provider.  Please return immediately if condition  worsens. Please contact her primary physician or the physician you were given for referral. If you have any specialist physicians involved in her treatment and plan please also contact them. Thank you for using [facility].

## 2015-07-23 ENCOUNTER — Other Ambulatory Visit: Payer: Medicare Other

## 2015-07-24 ENCOUNTER — Ambulatory Visit
Admission: RE | Admit: 2015-07-24 | Discharge: 2015-07-24 | Disposition: A | Discharge status: Home / Self Care | Payer: Medicare Other | Source: Ambulatory Visit | Attending: Oncology | Admitting: Oncology

## 2015-07-24 DIAGNOSIS — D7389 Other diseases of spleen: Secondary | ICD-10-CM | POA: Insufficient documentation

## 2015-07-24 DIAGNOSIS — R911 Solitary pulmonary nodule: Secondary | ICD-10-CM

## 2015-07-24 DIAGNOSIS — R918 Other nonspecific abnormal finding of lung field: Secondary | ICD-10-CM | POA: Insufficient documentation

## 2015-07-24 HISTORY — DX: Unspecified asthma, uncomplicated: J45.909

## 2015-07-24 MED ORDER — IOHEXOL 300 MG/ML  SOLN
75.0000 mL | Freq: Once | INTRAMUSCULAR | Status: AC | PRN
  Administered 2015-07-24: 75 mL via INTRAVENOUS

## 2015-07-28 ENCOUNTER — Encounter: Payer: Self-pay | Admitting: Oncology

## 2015-07-28 ENCOUNTER — Inpatient Hospital Stay: Payer: Medicare Other

## 2015-07-28 ENCOUNTER — Inpatient Hospital Stay: Payer: Medicare Other | Attending: Oncology | Admitting: Oncology

## 2015-07-28 VITALS — BP 121/76 | HR 137 | Resp 18 | Wt 162.0 lb

## 2015-07-28 DIAGNOSIS — Z85118 Personal history of other malignant neoplasm of bronchus and lung: Secondary | ICD-10-CM | POA: Diagnosis not present

## 2015-07-28 DIAGNOSIS — K449 Diaphragmatic hernia without obstruction or gangrene: Secondary | ICD-10-CM | POA: Insufficient documentation

## 2015-07-28 DIAGNOSIS — Z923 Personal history of irradiation: Secondary | ICD-10-CM | POA: Insufficient documentation

## 2015-07-28 DIAGNOSIS — C3490 Malignant neoplasm of unspecified part of unspecified bronchus or lung: Secondary | ICD-10-CM

## 2015-07-28 DIAGNOSIS — Z79899 Other long term (current) drug therapy: Secondary | ICD-10-CM

## 2015-07-28 DIAGNOSIS — R7303 Prediabetes: Secondary | ICD-10-CM | POA: Insufficient documentation

## 2015-07-28 DIAGNOSIS — R161 Splenomegaly, not elsewhere classified: Secondary | ICD-10-CM | POA: Insufficient documentation

## 2015-07-28 DIAGNOSIS — R911 Solitary pulmonary nodule: Secondary | ICD-10-CM | POA: Insufficient documentation

## 2015-07-28 DIAGNOSIS — E785 Hyperlipidemia, unspecified: Secondary | ICD-10-CM | POA: Insufficient documentation

## 2015-07-28 NOTE — Progress Notes (Signed)
[place] @ [facility] Telephone:([phone]  Fax:(336) [phone]     Jody [name] OB: [birth year]  MR#: [id number]  GGY#:[id number]  Patient Care Team: Adrian Prows, MD as PCP - General (Infectious Diseases)  CHIEF COMPLAINT:  Chief Complaint  Patient presents with  . Lung Cancer   Chief Complaint/Problem List:   Carcinoma of lung, status post radiation therapy. Adenocarcinoma, T1N0M0. Stage I. September 2006     INTERVAL HISTORY: 75 year old lady with a history of carcinoma of lung came today for further follow-up.  Patient had a chest x-ray done.  No cough or shortness of breath patient does not smoke patient also has bronchial asthma and recently diagnosed.  Appetite has been stable.  No nausea.  No vomiting.  No hemoptysis.  Patient is here for ongoing evaluation.  Dry hacking cough but no hemoptysis no shortness of breath no chest pain  REVIEW OF SYSTEMS:  GENERAL:  Feels good.  Active.  No fevers, sweats or weight loss. PERFORMANCE STATUS (ECOG):  01 HEENT:  No visual changes, runny nose, sore throat, mouth sores or tenderness. Lungs: No shortness of breath or cough.  No hemoptysis. Cardiac:  No chest pain, palpitations, orthopnea, or PND. GI:  No nausea, vomiting, diarrhea, constipation, melena or hematochezia. GU:  No urgency, frequency, dysuria, or hematuria. Musculoskeletal:  No back pain.  No joint pain.  No muscle tenderness. Extremities:  No pain or swelling. Skin:  No rashes or skin changes. Neuro:  No headache, numbness or weakness, balance or coordination issues. Endocrine:  No diabetes, thyroid issues, hot flashes or night sweats. Psych:  No mood changes, depression or anxiety. Pain:  No focal pain. Review of systems:  All other systems reviewed and found to be negative. As per HPI. Otherwise, a complete review of systems is negatve.  Significant History/PMH:   reflux esophagitis:    anxiety depressive syndrome:    Cancer, Lung: Sep 2006     Hypertension:    Hyperlipidemia:    nissen fundoplication:   Smoking History: Smoking History Never Smoked.(1)  PFSH: Additional Past Medical and Surgical History: Allergies:  Penicillin causing rash    Past Medical History:  Hiatal hernia  Bronchitis  Hyperlipidemia  Anxiety depressive syndrome    Past Surgical History:  Appendectomy 1970  Cholecystectomy 1980  Reflux esophagitis status post Nissen fundoplication.  Ovarian cyst surgery in the past related to sterility.    Family History:  History of breast cancer with probable metastatic disease to liver. History of heart disease.    Social History:  No alcohol use. No tobacco use. No radiation exposure. No recreational drug use.   Has never smoked in the past.   ADVANCED DIRECTIVES:  Patient does not have any living will or healthcare power of attorney.  Information was given .  Available resources had been discussed.  We will follow-up on subsequent appointments regarding this issue HEALTH MAINTENANCE: Social History  Substance Use Topics  . Smoking status: Never Smoker   . Smokeless tobacco: None  . Alcohol Use: No      Allergies  Allergen Reactions  . Extract Of [facility] Other (See Comments)    blistering  . Ampicillin Rash  . Penicillin V Potassium Rash      OBJECTIVE:  Filed Vitals:   07/28/15 1028  BP: 121/76  Pulse: 137  Resp: 18     Body mass index is 32.71 kg/(m^2).    ECOG FS:1 - Symptomatic but completely ambulatory  PHYSICAL EXAM: GENERAL:  Well  developed, well nourished, sitting comfortably in the exam room in no acute distress. MENTAL STATUS:  Alert and oriented to person, place and time.  ENT:  Oropharynx clear without lesion.  Tongue normal. Mucous membranes moist.  RESPIRATORY:  Clear to auscultation without rales, wheezes or rhonchi. CARDIOVASCULAR:  Regular rate and rhythm without murmur, rub or gallop. BREAST:  Right breast without masses, skin changes or nipple discharge.   Left breast without masses, skin changes or nipple discharge. ABDOMEN:  Soft, non-tender, with active bowel sounds, and no hepatosplenomegaly.  No masses. BACK:  No CVA tenderness.  No tenderness on percussion of the back or rib cage. SKIN:  No rashes, ulcers or lesions. EXTREMITIES: No edema, no skin discoloration or tenderness.  No palpable cords. LYMPH NODES: No palpable cervical, supraclavicular, axillary or inguinal adenopathy  NEUROLOGICAL: Unremarkable. PSYCH:  Appropriate.   LAB RESULTS:  CBC Latest Ref Rng 02/10/2015 01/13/2015  WBC 3.6 - 11.0 K/uL 13.5(H) 9.1  Hemoglobin 12.0 - 16.0 g/dL 13.6 13.1  Hematocrit 35.0 - 47.0 % 40.2 38.1  Platelets 150 - 440 K/uL 242 276    No visits with results within 5 Day(s) from this visit. Latest known visit with results is:  Admission on 02/10/2015, Discharged on 02/10/2015  Component Date Value Ref Range Status  . Sodium 02/10/2015 132* 135 - 145 mmol/L Final  . Potassium 02/10/2015 3.3* 3.5 - 5.1 mmol/L Final  . Chloride 02/10/2015 93* 101 - 111 mmol/L Final  . CO2 02/10/2015 26  22 - 32 mmol/L Final  . Glucose, Bld 02/10/2015 153* 65 - 99 mg/dL Final  . BUN 02/10/2015 16  6 - 20 mg/dL Final  . Creatinine, Ser 02/10/2015 0.75  0.44 - 1.00 mg/dL Final  . Calcium 02/10/2015 8.9  8.9 - 10.3 mg/dL Final  . Total Protein 02/10/2015 7.2  6.5 - 8.1 g/dL Final  . Albumin 02/10/2015 3.5  3.5 - 5.0 g/dL Final  . AST 02/10/2015 35  15 - 41 U/L Final  . ALT 02/10/2015 33  14 - 54 U/L Final  . Alkaline Phosphatase 02/10/2015 87  38 - 126 U/L Final  . Total Bilirubin 02/10/2015 0.8  0.3 - 1.2 mg/dL Final  . GFR calc non Af Amer 02/10/2015 >60  >60 mL/min Final  . GFR calc Af Amer 02/10/2015 >60  >60 mL/min Final   Comment: (NOTE) The eGFR has been calculated using the CKD EPI equation. This calculation has not been validated in all clinical situations. eGFR's persistently <60 mL/min signify possible Chronic Kidney Disease.   . Anion gap  02/10/2015 13  5 - 15 Final  . WBC 02/10/2015 13.5* 3.6 - 11.0 K/uL Final  . RBC 02/10/2015 4.84  3.80 - 5.20 MIL/uL Final  . Hemoglobin 02/10/2015 13.6  12.0 - 16.0 g/dL Final  . HCT 02/10/2015 40.2  35.0 - 47.0 % Final  . MCV 02/10/2015 82.9  80.0 - 100.0 fL Final  . MCH 02/10/2015 28.2  26.0 - 34.0 pg Final  . MCHC 02/10/2015 34.0  32.0 - 36.0 g/dL Final  . RDW 02/10/2015 14.4  11.5 - 14.5 % Final  . Platelets 02/10/2015 242  150 - 440 K/uL Final  . Color, Urine 02/10/2015 YELLOW* YELLOW Final  . APPearance 02/10/2015 CLEAR* CLEAR Final  . Glucose, UA 02/10/2015 NEGATIVE  NEGATIVE mg/dL Final  . Bilirubin Urine 02/10/2015 NEGATIVE  NEGATIVE Final  . Ketones, ur 02/10/2015 TRACE* NEGATIVE mg/dL Final  . Specific Gravity, Urine 02/10/2015 1.016  1.005 - 1.030  Final  . Hgb urine dipstick 02/10/2015 NEGATIVE  NEGATIVE Final  . pH 02/10/2015 6.0  5.0 - 8.0 Final  . Protein, ur 02/10/2015 30* NEGATIVE mg/dL Final  . Nitrite 02/10/2015 NEGATIVE  NEGATIVE Final  . Leukocytes, UA 02/10/2015 NEGATIVE  NEGATIVE Final  . RBC / HPF 02/10/2015 0-5  0 - 5 RBC/hpf Final  . WBC, UA 02/10/2015 0-5  0 - 5 WBC/hpf Final  . Bacteria, UA 02/10/2015 NONE SEEN  NONE SEEN Final  . Squamous Epithelial / LPF 02/10/2015 0-5* NONE SEEN Final  . Mucous 02/10/2015 PRESENT   Final  . Hyaline Casts, UA 02/10/2015 PRESENT   Final  . Troponin I 02/10/2015 <0.03  <0.031 ng/mL Final   Comment:        NO INDICATION OF MYOCARDIAL INJURY.   . B Natriuretic Peptide 02/10/2015 485.0* 0.0 - 100.0 pg/mL Final  . Magnesium 02/10/2015 2.5* 1.7 - 2.4 mg/dL Final      STUDIES: Ct Chest W Contrast  07/24/2015  CLINICAL DATA:  Follow up pulmonary nodule. History of lung cancer 10 years prior. EXAM: CT CHEST WITH CONTRAST TECHNIQUE: Multidetector CT imaging of the chest was performed during intravenous contrast administration. CONTRAST:  78m OMNIPAQUE IOHEXOL 300 MG/ML  SOLN COMPARISON:  Chest CT 01/17/2015 and 12/17/2011.  FINDINGS: Mediastinum/Nodes: There are no enlarged mediastinal, hilar or axillary lymph nodes. There is stable moderate size hiatal hernia. The heart is mildly enlarged. There is no pericardial effusion. There is diffuse atherosclerosis of the aorta, great vessels and coronary arteries. Lungs/Pleura: There is no pleural effusion. The partially calcified lesion and surrounding radiation changes in the right upper lobe are stable. Numerous small pulmonary nodules are again noted bilaterally. Most of these are unchanged from the 2013 study, including a 6 mm left apical lesion on image 9. Superior segment right lower lobe nodule along the superior aspect of the major fissure may be slightly larger, measuring 8 mm on image 17 (previously 5-6 mm). This lesion is not clearly seen on the 2013 study. Adjacent sub cm ground-glass densities in the left lower lobe are grossly stable from 2013, largest measuring 6 mm on image 30. No other new or enlarging nodules are seen. Upper abdomen: There is a complex splenic mass which has enlarged from 2013. This is incompletely visualized on the current study, measuring up to 11.9 x 7.6 cm on image 54. This demonstrates increased calcification compared with the baseline study. The visualized liver and adrenal glands appear unremarkable. This lesion has been seen on prior abdominal CTs dating back to 01/15/2005. Musculoskeletal/Chest wall: There is no chest wall mass or suspicious osseous finding. IMPRESSION: 1. Small nodule in the superior segment of the right lower lobe demonstrates possible enlargement from the prior study of 6 months ago. This nodule remains too small to evaluate with PET-CT at this time. Continued follow-up recommended with CT in 6 months. Fleischner criteria do not apply given the patient's personal history of lung cancer. 2. Multiple other pulmonary nodules have not significantly changed. 3. Chronic splenic lesion has enlarged with increased calcifications,  although is likely a benign finding based on chronicity. Electronically Signed   By: WRichardean SaleM.D.   On: 07/24/2015 14:17    ASSESSMENT: Adenocarcinoma of lung status postradiation therapy And had a carcinoma several months ago now has bilateral pulmonary nodule Patient has been a nonsmoker Remains asymptomatic Repeat CT scans continues to stow shows stable pulmonary nodule right upper lobe nodule is increasing in size slightly but did  discuss situation with radiologists is not within the PET scan range limit nor a biopsy can be done without too much complication We might consider doing the liquid biopsy to see if we have EGFR mutation nor any other mutation available. Question still remains whether this is recurrent progressive lung cancer versus benign pulmonary nodules will continue to follow as patient remains asymptomatic Repeat CT scan in 6 months or before the patient become symptomatic   Ct scan has been reviewed independently and reviewed with the patient  Liquid  biopsy has been arranged  2, Dr. Ola Spurr will arrange a mammogram last mammogram was in April All of the lab data has been reviewed  Patient expressed understanding and was in agreement with this plan. She also understands that She can call clinic at any time with any questions, concerns, or complaints.    No matching staging information was found for the patient.  Forest Gleason, MD   07/28/2015 10:50 AM

## 2015-09-25 ENCOUNTER — Encounter: Payer: Self-pay | Admitting: Oncology

## 2015-09-25 ENCOUNTER — Inpatient Hospital Stay: Payer: Medicare Other | Attending: Oncology | Admitting: Oncology

## 2015-09-25 VITALS — BP 106/72 | HR 142 | Temp 95.9°F | Resp 18 | Wt 160.9 lb

## 2015-09-25 DIAGNOSIS — R918 Other nonspecific abnormal finding of lung field: Secondary | ICD-10-CM

## 2015-09-25 DIAGNOSIS — F419 Anxiety disorder, unspecified: Secondary | ICD-10-CM | POA: Diagnosis not present

## 2015-09-25 DIAGNOSIS — Z923 Personal history of irradiation: Secondary | ICD-10-CM | POA: Insufficient documentation

## 2015-09-25 DIAGNOSIS — C3490 Malignant neoplasm of unspecified part of unspecified bronchus or lung: Secondary | ICD-10-CM

## 2015-09-25 DIAGNOSIS — Z85118 Personal history of other malignant neoplasm of bronchus and lung: Secondary | ICD-10-CM | POA: Insufficient documentation

## 2015-09-25 DIAGNOSIS — E785 Hyperlipidemia, unspecified: Secondary | ICD-10-CM | POA: Diagnosis not present

## 2015-09-28 ENCOUNTER — Encounter: Payer: Self-pay | Admitting: Oncology

## 2015-09-28 NOTE — Progress Notes (Signed)
[place] @ [facility] Telephone:([phone]  Fax:(336) [phone]     Jody [name] OB: Feb 08, 1941  MR#: [id number]  IZT#:[id number]  Patient Care Team: Leonel Ramsay, MD as PCP - General (Infectious Diseases)  CHIEF COMPLAINT:  Chief Complaint  Patient presents with  . Lung Cancer   Chief Complaint/Problem List:   Carcinoma of lung, status post radiation therapy. Adenocarcinoma, T1N0M0. Stage I. September 2006     INTERVAL HISTORY: 75 year old lady with a history of carcinoma of lung came today for further follow-up.  Patient had a chest x-ray done.  No cough or shortness of breath patient does not smoke patient also has bronchial asthma and recently diagnosed.  Appetite has been stable.  No nausea.  No vomiting.  No hemoptysis. Recent is here for further follow-up regarding lung nodule with the previous history of carcinoma of lung.. NO Cough or shortness of breath.  Appetite remains stable.  REVIEW OF SYSTEMS:  GENERAL:  Feels good.  Active.  No fevers, sweats or weight loss. PERFORMANCE STATUS (ECOG):  01 HEENT:  No visual changes, runny nose, sore throat, mouth sores or tenderness. Lungs: No shortness of breath or cough.  No hemoptysis. Cardiac:  No chest pain, palpitations, orthopnea, or PND. GI:  No nausea, vomiting, diarrhea, constipation, melena or hematochezia. GU:  No urgency, frequency, dysuria, or hematuria. Musculoskeletal:  No back pain.  No joint pain.  No muscle tenderness. Extremities:  No pain or swelling. Skin:  No rashes or skin changes. Neuro:  No headache, numbness or weakness, balance or coordination issues. Endocrine:  No diabetes, thyroid issues, hot flashes or night sweats. Psych:  No mood changes, depression or anxiety. Pain:  No focal pain. Review of systems:  All other systems reviewed and found to be negative. As per HPI. Otherwise, a complete review of systems is negatve.  Significant History/PMH:   reflux esophagitis:    anxiety  depressive syndrome:    Cancer, Lung: Sep 2006   Hypertension:    Hyperlipidemia:    nissen fundoplication:   Smoking History: Smoking History Never Smoked.(1)  PFSH: Additional Past Medical and Surgical History: Allergies:  Penicillin causing rash    Past Medical History:  Hiatal hernia  Bronchitis  Hyperlipidemia  Anxiety depressive syndrome    Past Surgical History:  Appendectomy 1970  Cholecystectomy 1980  Reflux esophagitis status post Nissen fundoplication.  Ovarian cyst surgery in the past related to sterility.    Family History:  History of breast cancer with probable metastatic disease to liver. History of heart disease.    Social History:  No alcohol use. No tobacco use. No radiation exposure. No recreational drug use.   Has never smoked in the past.   ADVANCED DIRECTIVES:  Patient does not have any living will or healthcare power of attorney.  Information was given .  Available resources had been discussed.  We will follow-up on subsequent appointments regarding this issue HEALTH MAINTENANCE: Social History  Substance Use Topics  . Smoking status: Never Smoker   . Smokeless tobacco: None  . Alcohol Use: No      Allergies  Allergen Reactions  . Extract Of [facility] Other (See Comments)    blistering  . Ampicillin Rash  . Penicillin V Potassium Rash      OBJECTIVE:  Filed Vitals:   09/25/15 0939  BP: 106/72  Pulse: 142  Temp: 95.9 F (35.5 C)  Resp: 18     Body mass index is 32.49 kg/(m^2).    ECOG  FS:1 - Symptomatic but completely ambulatory  PHYSICAL EXAM: GENERAL:  Well developed, well nourished, sitting comfortably in the exam room in no acute distress. MENTAL STATUS:  Alert and oriented to person, place and time.  ENT:  Oropharynx clear without lesion.  Tongue normal. Mucous membranes moist.  RESPIRATORY:  Clear to auscultation without rales, wheezes or rhonchi. CARDIOVASCULAR:  Regular rate and rhythm without murmur, rub or  gallop. BREAST:  Right breast without masses, skin changes or nipple discharge.  Left breast without masses, skin changes or nipple discharge. ABDOMEN:  Soft, non-tender, with active bowel sounds, and no hepatosplenomegaly.  No masses. BACK:  No CVA tenderness.  No tenderness on percussion of the back or rib cage. SKIN:  No rashes, ulcers or lesions. EXTREMITIES: No edema, no skin discoloration or tenderness.  No palpable cords. LYMPH NODES: No palpable cervical, supraclavicular, axillary or inguinal adenopathy  NEUROLOGICAL: Unremarkable. PSYCH:  Appropriate.   LAB RESULTS:  CBC Latest Ref Rng 02/10/2015 01/13/2015  WBC 3.6 - 11.0 K/uL 13.5(H) 9.1  Hemoglobin 12.0 - 16.0 g/dL 13.6 13.1  Hematocrit 35.0 - 47.0 % 40.2 38.1  Platelets 150 - 440 K/uL 242 276        STUDIES: IMPRESSION: 1. Small nodule in the superior segment of the right lower lobe demonstrates possible enlargement from the prior study of 6 months ago. This nodule remains too small to evaluate with PET-CT at this time. Continued follow-up recommended with CT in 6 months. Fleischner criteria do not apply given the patient's personal history of lung cancer. 2. Multiple other pulmonary nodules have not significantly changed. 3. Chronic splenic lesion has enlarged with increased calcifications, although is likely a benign finding based on chronicity.ASSESSMENT: Adenocarcinoma of lung status postradiation therapy Patient had an abnormal chest x-ray which is reviewed this was followed by CT scan which revealed stable changes in the right upper lobe where patient had been treated with radiation.  Scattered tiny bilateral pulmonary parenchymal nodules 2 nodules in the right lungs are new and one is measuring 5 mm. Another CT scan with contrast has been recommended in 6-12 months  MEDICAL DECISION MAKING:  CT scan and the chest x-rays are reviewed will arrange for another CT scan in 6 months and reevaluation after that for  follow-up regarding lung nodule. Right upper lobe lung cancer, adenocarcinoma stage I status postradiation therapy  CT scan revealed multiple pulmonary nodules. Complex splenic mass has slightly increased but stable since CT scan of 2006 suggest benign etiology All pulmonary nodules are stable except for superior segment of the right upper lobe nodule remains too small for any further characterization with a PET scan Possibility of CT scan without contrast for follow-up this of this nodule will be considered in 6 months and reevaluate patient.  In my absence patient be evaluated by my associate. Patient is getting her regular mammogram done by primary care physician  Total duration of visit was 30 minutes.  50% or more time was spent in counseling patient and family regarding prognosis and options of treatment and available resources No matching staging information was found for the patient.  Forest Gleason, MD   09/28/2015 1:18 PM

## 2015-09-30 ENCOUNTER — Other Ambulatory Visit: Payer: Self-pay | Admitting: *Deleted

## 2015-09-30 DIAGNOSIS — R911 Solitary pulmonary nodule: Secondary | ICD-10-CM

## 2015-12-29 ENCOUNTER — Ambulatory Visit: Admission: RE | Admit: 2015-12-29 | Payer: Medicare Other | Source: Ambulatory Visit

## 2015-12-29 ENCOUNTER — Ambulatory Visit: Payer: Medicare Other

## 2015-12-29 ENCOUNTER — Ambulatory Visit
Admission: RE | Admit: 2015-12-29 | Discharge: 2015-12-29 | Disposition: A | Discharge status: Home / Self Care | Payer: Medicare Other | Source: Ambulatory Visit | Attending: Oncology | Admitting: Oncology

## 2015-12-29 DIAGNOSIS — R161 Splenomegaly, not elsewhere classified: Secondary | ICD-10-CM | POA: Diagnosis not present

## 2015-12-29 DIAGNOSIS — R911 Solitary pulmonary nodule: Secondary | ICD-10-CM | POA: Diagnosis not present

## 2015-12-29 DIAGNOSIS — I2510 Atherosclerotic heart disease of native coronary artery without angina pectoris: Secondary | ICD-10-CM | POA: Diagnosis not present

## 2015-12-29 DIAGNOSIS — I700 Atherosclerosis of aorta: Secondary | ICD-10-CM | POA: Insufficient documentation

## 2015-12-30 ENCOUNTER — Other Ambulatory Visit: Payer: Self-pay | Admitting: *Deleted

## 2015-12-30 DIAGNOSIS — R918 Other nonspecific abnormal finding of lung field: Secondary | ICD-10-CM

## 2015-12-31 ENCOUNTER — Inpatient Hospital Stay: Payer: Medicare Other

## 2015-12-31 ENCOUNTER — Inpatient Hospital Stay: Payer: Medicare Other | Attending: Internal Medicine | Admitting: Internal Medicine

## 2015-12-31 DIAGNOSIS — I4891 Unspecified atrial fibrillation: Secondary | ICD-10-CM | POA: Insufficient documentation

## 2015-12-31 DIAGNOSIS — J45909 Unspecified asthma, uncomplicated: Secondary | ICD-10-CM | POA: Diagnosis not present

## 2015-12-31 DIAGNOSIS — Z79899 Other long term (current) drug therapy: Secondary | ICD-10-CM | POA: Insufficient documentation

## 2015-12-31 DIAGNOSIS — C3411 Malignant neoplasm of upper lobe, right bronchus or lung: Secondary | ICD-10-CM | POA: Diagnosis present

## 2015-12-31 DIAGNOSIS — R918 Other nonspecific abnormal finding of lung field: Secondary | ICD-10-CM

## 2015-12-31 DIAGNOSIS — Z923 Personal history of irradiation: Secondary | ICD-10-CM | POA: Insufficient documentation

## 2015-12-31 LAB — CBC WITH DIFFERENTIAL/PLATELET
BASOS ABS: 0.1 10*3/uL (ref 0–0.1)
BASOS PCT: 1 %
EOS ABS: 0 10*3/uL (ref 0–0.7)
EOS PCT: 1 %
HCT: 37.1 % (ref 35.0–47.0)
HEMOGLOBIN: 12.6 g/dL (ref 12.0–16.0)
LYMPHS ABS: 1.2 10*3/uL (ref 1.0–3.6)
Lymphocytes Relative: 12 %
MCH: 27.6 pg (ref 26.0–34.0)
MCHC: 34 g/dL (ref 32.0–36.0)
MCV: 81.2 fL (ref 80.0–100.0)
Monocytes Absolute: 0.8 10*3/uL (ref 0.2–0.9)
Monocytes Relative: 8 %
NEUTROS PCT: 80 %
Neutro Abs: 8.1 10*3/uL — ABNORMAL HIGH (ref 1.4–6.5)
PLATELETS: 283 10*3/uL (ref 150–440)
RBC: 4.57 MIL/uL (ref 3.80–5.20)
RDW: 16.4 % — ABNORMAL HIGH (ref 11.5–14.5)
WBC: 10.1 10*3/uL (ref 3.6–11.0)

## 2015-12-31 LAB — COMPREHENSIVE METABOLIC PANEL
ALBUMIN: 4.1 g/dL (ref 3.5–5.0)
ALK PHOS: 74 U/L (ref 38–126)
ALT: 12 U/L — AB (ref 14–54)
AST: 14 U/L — AB (ref 15–41)
Anion gap: 7 (ref 5–15)
BUN: 17 mg/dL (ref 6–20)
CALCIUM: 9.3 mg/dL (ref 8.9–10.3)
CHLORIDE: 95 mmol/L — AB (ref 101–111)
CO2: 30 mmol/L (ref 22–32)
CREATININE: 0.82 mg/dL (ref 0.44–1.00)
GFR calc Af Amer: >60 mL/min (ref >60)
GFR calc non Af Amer: >60 mL/min (ref >60)
GLUCOSE: 105 mg/dL — AB (ref 65–99)
Potassium: 4.2 mmol/L (ref 3.5–5.1)
SODIUM: 132 mmol/L — AB (ref 135–145)
Total Bilirubin: 0.9 mg/dL (ref 0.3–1.2)
Total Protein: 7.5 g/dL (ref 6.5–8.1)

## 2015-12-31 NOTE — Assessment & Plan Note (Signed)
Stage I lung cancer status post resection 2005. Clinically no evidence of recurrence  # Bilateral lung nodules- stable on most recent CT scan; described above. However bilateral lower lobe groundglass opacities- need follow-up in 6 months. Recommend CT scan.  # Reviewed the blood work within normal limits.  # Follow-up with me in 6 months/labs CT scan prior.

## 2015-12-31 NOTE — Progress Notes (Signed)
[place] OFFICE PROGRESS NOTE  Patient Care Team: Leonel Ramsay, MD as PCP - General (Infectious Diseases)  No matching staging information was found for the patient.   Oncology History   Carcinoma of lung, status post radiation therapy. Adenocarcinoma, T1N0M0. Stage I. 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 I reviewed the patient's prior charts/pertinent labs/imaging in detail; findings are summarized above.    Jody [name] 75 y.o.  female pleasant patient above history of Stage I lung cancer is here to review the results of her CAT scan.  Denies any worsening cough. Denies any worsening shortness of breath or chest pain or fevers. No nausea no vomiting. No headaches or bone pain.  REVIEW OF SYSTEMS:  A complete 10 point review of system is done which is negative except mentioned above/history of present illness.   PAST MEDICAL HISTORY :  Past Medical History:  Diagnosis Date  . Asthma   . Atrial fibrillation ([place])   . Lung cancer ([place])    10 yrs ago    PAST SURGICAL HISTORY :  No past surgical history on file.  FAMILY HISTORY :  No family history on file.  SOCIAL HISTORY:   Social History  Substance Use Topics  . Smoking status: Never Smoker  . Smokeless tobacco: Not on file  . Alcohol use No    ALLERGIES:  is allergic to extract of poison oak; ampicillin; and penicillin v potassium.  MEDICATIONS:  Current Outpatient Prescriptions  Medication Sig Dispense Refill  . ALPRAZolam (XANAX) 0.25 MG tablet Take by mouth.    Marland Kitchen apixaban (ELIQUIS) 5 MG TABS tablet Take by mouth.    . diltiazem (CARDIZEM CD)  120 MG 24 hr capsule Take by mouth.    Marland Kitchen FLUoxetine (PROZAC) 20 MG capsule Take by mouth.    . lovastatin (MEVACOR) 20 MG tablet Take by mouth.    . metoprolol succinate (TOPROL XL) 50 MG 24 hr tablet Take 1 tablet (50 mg total) by mouth daily. Take with or immediately following a meal. 30 tablet 11  . montelukast (SINGULAIR) 10 MG tablet Take by mouth.    . Multiple Vitamin (MULTI-VITAMINS) TABS Take by mouth.    Marland Kitchen omeprazole (PRILOSEC) 20 MG capsule Take by mouth.    . furosemide (LASIX) 20 MG tablet Take by mouth.    . potassium chloride (K-DUR) 10 MEQ tablet Take by mouth.     No current facility-administered medications for this visit.     PHYSICAL EXAMINATION: ECOG PERFORMANCE STATUS: 0 - Asymptomatic  BP 121/83 (BP Location: Left Arm, Patient Position: Sitting)   Pulse (!) 120   Temp 97 F (36.1 C) (Tympanic)   Resp 18   Wt 160 lb 5 oz (72.7 kg)   BMI 32.38 kg/m   Filed Weights   12/31/15 1137  Weight: 160 lb 5 oz (72.7 kg)    GENERAL: Well-nourished well-developed; Alert, no distress and comfortable.  Accompanied by her husband. EYES: no pallor or icterus OROPHARYNX: no thrush or ulceration; good dentition  NECK: supple, no masses felt LYMPH:  no palpable lymphadenopathy in the cervical, axillary or  inguinal regions LUNGS: clear to auscultation and  No wheeze or crackles HEART/CVS: regular rate & rhythm and no murmurs; No lower extremity edema ABDOMEN:abdomen soft, non-tender and normal bowel sounds Musculoskeletal:no cyanosis of digits and no clubbing  PSYCH: alert & oriented x 3 with fluent speech NEURO: no focal motor/sensory deficits SKIN:  no rashes or significant lesions  LABORATORY DATA:  I have reviewed the data as listed    Component Value Date/Time   NA 132 (L) 12/31/2015 1028   NA 136 01/09/2014 0950   K 4.2 12/31/2015 1028   K 4.4 01/09/2014 0950   CL 95 (L) 12/31/2015 1028   CL 100 01/09/2014 0950   CO2 30 12/31/2015 1028   CO2 31 01/09/2014  0950   GLUCOSE 105 (H) 12/31/2015 1028   GLUCOSE 114 (H) 01/09/2014 0950   BUN 17 12/31/2015 1028   BUN 12 01/09/2014 0950   CREATININE 0.82 12/31/2015 1028   CREATININE 0.85 01/09/2014 0950   CALCIUM 9.3 12/31/2015 1028   CALCIUM 9.1 01/09/2014 0950   PROT 7.5 12/31/2015 1028   PROT 7.4 01/09/2014 0950   ALBUMIN 4.1 12/31/2015 1028   ALBUMIN 3.6 01/09/2014 0950   AST 14 (L) 12/31/2015 1028   AST 24 01/09/2014 0950   ALT 12 (L) 12/31/2015 1028   ALT 22 01/09/2014 0950   ALKPHOS 74 12/31/2015 1028   ALKPHOS 76 01/09/2014 0950   BILITOT 0.9 12/31/2015 1028   BILITOT 1.2 (H) 01/09/2014 0950   GFRNONAA >60 12/31/2015 1028   GFRNONAA >60 01/09/2014 0950   GFRAA >60 12/31/2015 1028   GFRAA >60 01/09/2014 0950    No results found for: SPEP, UPEP  Lab Results  Component Value Date   WBC 10.1 12/31/2015   NEUTROABS 8.1 (H) 12/31/2015   HGB 12.6 12/31/2015   HCT 37.1 12/31/2015   MCV 81.2 12/31/2015   PLT 283 12/31/2015      Chemistry      Component Value Date/Time   NA 132 (L) 12/31/2015 1028   NA 136 01/09/2014 0950   K 4.2 12/31/2015 1028   K 4.4 01/09/2014 0950   CL 95 (L) 12/31/2015 1028   CL 100 01/09/2014 0950   CO2 30 12/31/2015 1028   CO2 31 01/09/2014 0950   BUN 17 12/31/2015 1028   BUN 12 01/09/2014 0950   CREATININE 0.82 12/31/2015 1028   CREATININE 0.85 01/09/2014 0950      Component Value Date/Time   CALCIUM 9.3 12/31/2015 1028   CALCIUM 9.1 01/09/2014 0950   ALKPHOS 74 12/31/2015 1028   ALKPHOS 76 01/09/2014 0950   AST 14 (L) 12/31/2015 1028   AST 24 01/09/2014 0950   ALT 12 (L) 12/31/2015 1028   ALT 22 01/09/2014 0950   BILITOT 0.9 12/31/2015 1028   BILITOT 1.2 (H) 01/09/2014 0950       RADIOGRAPHIC STUDIES: I have personally reviewed the radiological images as listed and agreed with the findings in the report. No results found.   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 Clinically no evidence of recurrence  # Bilateral lung nodules- stable on most recent CT scan; described above. However bilateral lower lobe groundglass opacities- need follow-up in 6 months. Recommend CT scan.  # Reviewed the blood work within normal limits.  # Follow-up with me in 6 months/labs CT scan prior.   Orders Placed This Encounter  Procedures  . CT CHEST WO  CONTRAST    Standing Status:   Future    Standing Expiration Date:   03/01/2017    Order Specific Question:   Reason for Exam (SYMPTOM  OR DIAGNOSIS REQUIRED)    Answer:   lung cancer; lung nodules    Order Specific Question:   Preferred imaging location?    Answer:   [facility]  . Comprehensive metabolic panel    Standing Status:   Future    Standing Expiration Date:   12/30/2016  . CBC with Differential    Standing Status:   Future    Standing Expiration Date:   12/30/2016   All questions were answered. The patient knows to call the clinic with any problems, questions or concerns.      Cammie Sickle, MD 12/31/2015 5:19 PM

## 2016-06-25 ENCOUNTER — Ambulatory Visit
Admission: RE | Admit: 2016-06-25 | Discharge: 2016-06-25 | Disposition: A | Discharge status: Home / Self Care | Payer: Medicare Other | Source: Ambulatory Visit | Attending: Internal Medicine | Admitting: Internal Medicine

## 2016-06-25 DIAGNOSIS — Z923 Personal history of irradiation: Secondary | ICD-10-CM | POA: Diagnosis not present

## 2016-06-25 DIAGNOSIS — I517 Cardiomegaly: Secondary | ICD-10-CM | POA: Diagnosis not present

## 2016-06-25 DIAGNOSIS — I700 Atherosclerosis of aorta: Secondary | ICD-10-CM | POA: Insufficient documentation

## 2016-06-25 DIAGNOSIS — I2510 Atherosclerotic heart disease of native coronary artery without angina pectoris: Secondary | ICD-10-CM | POA: Insufficient documentation

## 2016-06-25 DIAGNOSIS — R918 Other nonspecific abnormal finding of lung field: Secondary | ICD-10-CM | POA: Diagnosis not present

## 2016-06-25 DIAGNOSIS — J701 Chronic and other pulmonary manifestations due to radiation: Secondary | ICD-10-CM | POA: Insufficient documentation

## 2016-06-25 DIAGNOSIS — C3411 Malignant neoplasm of upper lobe, right bronchus or lung: Secondary | ICD-10-CM | POA: Insufficient documentation

## 2016-07-02 ENCOUNTER — Inpatient Hospital Stay: Payer: Medicare Other | Attending: Internal Medicine

## 2016-07-02 ENCOUNTER — Inpatient Hospital Stay (HOSPITAL_BASED_OUTPATIENT_CLINIC_OR_DEPARTMENT_OTHER): Payer: Medicare Other | Admitting: Internal Medicine

## 2016-07-02 DIAGNOSIS — I4891 Unspecified atrial fibrillation: Secondary | ICD-10-CM | POA: Insufficient documentation

## 2016-07-02 DIAGNOSIS — J45909 Unspecified asthma, uncomplicated: Secondary | ICD-10-CM | POA: Diagnosis not present

## 2016-07-02 DIAGNOSIS — Z79899 Other long term (current) drug therapy: Secondary | ICD-10-CM

## 2016-07-02 DIAGNOSIS — Z923 Personal history of irradiation: Secondary | ICD-10-CM | POA: Insufficient documentation

## 2016-07-02 DIAGNOSIS — C3411 Malignant neoplasm of upper lobe, right bronchus or lung: Secondary | ICD-10-CM | POA: Diagnosis not present

## 2016-07-02 LAB — CBC WITH DIFFERENTIAL/PLATELET
BASOS ABS: 0 10*3/uL (ref 0–0.1)
BASOS PCT: 0 %
EOS ABS: 0.1 10*3/uL (ref 0–0.7)
EOS PCT: 1 %
HCT: 37.3 % (ref 35.0–47.0)
Hemoglobin: 12.5 g/dL (ref 12.0–16.0)
LYMPHS PCT: 7 %
Lymphs Abs: 0.6 10*3/uL — ABNORMAL LOW (ref 1.0–3.6)
MCH: 26.6 pg (ref 26.0–34.0)
MCHC: 33.4 g/dL (ref 32.0–36.0)
MCV: 79.6 fL — ABNORMAL LOW (ref 80.0–100.0)
Monocytes Absolute: 0.6 10*3/uL (ref 0.2–0.9)
Monocytes Relative: 7 %
Neutro Abs: 7.4 10*3/uL — ABNORMAL HIGH (ref 1.4–6.5)
Neutrophils Relative %: 85 %
PLATELETS: 270 10*3/uL (ref 150–440)
RBC: 4.69 MIL/uL (ref 3.80–5.20)
RDW: 17.5 % — ABNORMAL HIGH (ref 11.5–14.5)
WBC: 8.7 10*3/uL (ref 3.6–11.0)

## 2016-07-02 LAB — COMPREHENSIVE METABOLIC PANEL
ALT: 12 U/L — ABNORMAL LOW (ref 14–54)
AST: 16 U/L (ref 15–41)
Albumin: 4.1 g/dL (ref 3.5–5.0)
Alkaline Phosphatase: 63 U/L (ref 38–126)
Anion gap: 5 (ref 5–15)
BILIRUBIN TOTAL: 1 mg/dL (ref 0.3–1.2)
BUN: 13 mg/dL (ref 6–20)
CALCIUM: 9.3 mg/dL (ref 8.9–10.3)
CO2: 32 mmol/L (ref 22–32)
Chloride: 99 mmol/L — ABNORMAL LOW (ref 101–111)
Creatinine, Ser: 0.93 mg/dL (ref 0.44–1.00)
GFR calc Af Amer: >60 mL/min (ref >60)
GFR, EST NON AFRICAN AMERICAN: 58 mL/min — AB (ref >60)
Glucose, Bld: 110 mg/dL — ABNORMAL HIGH (ref 65–99)
POTASSIUM: 4 mmol/L (ref 3.5–5.1)
Sodium: 136 mmol/L (ref 135–145)
TOTAL PROTEIN: 7.6 g/dL (ref 6.5–8.1)

## 2016-07-02 NOTE — Progress Notes (Signed)
Patient here today for follow up.  Patient states all medication/dosage/instrucations are correct, however information from "outside sources" show differently, patient is unsure, requested for her to bring medications with her for her next office visit.  Patient states no new concerns today

## 2016-07-02 NOTE — Progress Notes (Signed)
[place] OFFICE PROGRESS NOTE  Patient Care Team: Leonel Ramsay, MD as PCP - General (Infectious Diseases)  Cancer Staging No matching staging information was found for the patient.   Oncology History   Carcinoma of lung, status post radiation therapy. Adenocarcinoma, T1N0M0. Stage I. 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  female pleasant patient above history of Stage I lung cancer is here to review the results of her repeat CT scan.  Denies any worsening cough. Denies any worsening shortness of breath or chest pain or fevers. No nausea no vomiting. No headaches or bone pain.  REVIEW OF SYSTEMS:  A complete 10 point review of system is done which is negative except mentioned above/history of present illness.   PAST MEDICAL HISTORY :  Past Medical History:  Diagnosis Date  . Asthma   . Atrial fibrillation ([place])   . Lung cancer ([place])    10 yrs ago    PAST SURGICAL HISTORY :  No past surgical history on file.  FAMILY HISTORY :  No family history on file.  SOCIAL HISTORY:   Social History  Substance Use Topics  . Smoking status: Never Smoker  . Smokeless tobacco: Not on file  . Alcohol use No    ALLERGIES:  is allergic to poison oak extract; ampicillin; and penicillin v potassium.  MEDICATIONS:  Current Outpatient Prescriptions  Medication Sig Dispense Refill  . ALPRAZolam (XANAX) 0.25 MG tablet Take 0.25 mg by mouth at bedtime as needed.     Marland Kitchen apixaban (ELIQUIS) 5 MG TABS tablet Take 5 mg by mouth daily.     . Cholecalciferol (VITAMIN D PO) Take by mouth.    . diltiazem (CARDIZEM CD) 120 MG 24 hr capsule Take 120 mg by mouth daily.     Marland Kitchen FLUoxetine (PROZAC) 20 MG capsule  Take 20 mg by mouth daily.     Marland Kitchen lovastatin (MEVACOR) 20 MG tablet Take 20 mg by mouth daily at 6 PM.     . montelukast (SINGULAIR) 10 MG tablet Take 10 mg by mouth at bedtime.     . Multiple Vitamin (MULTI-VITAMINS) TABS Take by mouth.    Marland Kitchen omeprazole (PRILOSEC) 20 MG capsule Take 20 mg by mouth daily.     . furosemide (LASIX) 20 MG tablet Take by mouth.    . metoprolol succinate (TOPROL XL) 50 MG 24 hr tablet Take 1 tablet (50 mg total) by mouth daily. Take with or immediately following a meal. 30 tablet 11  . potassium chloride (K-DUR) 10 MEQ tablet Take by mouth.     No current facility-administered medications for this visit.     PHYSICAL EXAMINATION: ECOG PERFORMANCE STATUS: 0 - Asymptomatic  BP 110/78 (BP Location: Left Arm, Patient Position: Sitting)   Pulse (!) 111   Temp (!) 96.5 F (35.8 C) (Tympanic)   Wt 149 lb (67.6 kg)   BMI 30.09 kg/m   Filed Weights   07/02/16 1111  Weight: 149 lb (67.6 kg)    GENERAL: Well-nourished well-developed; Alert, no distress and comfortable.  Accompanied by her husband. EYES: no pallor or icterus OROPHARYNX: no thrush or ulceration; good dentition  NECK: supple, no masses  felt LYMPH:  no palpable lymphadenopathy in the cervical, axillary or inguinal regions LUNGS: clear to auscultation and  No wheeze or crackles HEART/CVS: regular rate & rhythm and no murmurs; No lower extremity edema ABDOMEN:abdomen soft, non-tender and normal bowel sounds Musculoskeletal:no cyanosis of digits and no clubbing  PSYCH: alert & oriented x 3 with fluent speech NEURO: no focal motor/sensory deficits SKIN:  no rashes or significant lesions  LABORATORY DATA:  I have reviewed the data as listed    Component Value Date/Time   NA 136 07/02/2016 1025   NA 136 01/09/2014 0950   K 4.0 07/02/2016 1025   K 4.4 01/09/2014 0950   CL 99 (L) 07/02/2016 1025   CL 100 01/09/2014 0950   CO2 32 07/02/2016 1025   CO2 31 01/09/2014 0950   GLUCOSE 110 (H)  07/02/2016 1025   GLUCOSE 114 (H) 01/09/2014 0950   BUN 13 07/02/2016 1025   BUN 12 01/09/2014 0950   CREATININE 0.93 07/02/2016 1025   CREATININE 0.85 01/09/2014 0950   CALCIUM 9.3 07/02/2016 1025   CALCIUM 9.1 01/09/2014 0950   PROT 7.6 07/02/2016 1025   PROT 7.4 01/09/2014 0950   ALBUMIN 4.1 07/02/2016 1025   ALBUMIN 3.6 01/09/2014 0950   AST 16 07/02/2016 1025   AST 24 01/09/2014 0950   ALT 12 (L) 07/02/2016 1025   ALT 22 01/09/2014 0950   ALKPHOS 63 07/02/2016 1025   ALKPHOS 76 01/09/2014 0950   BILITOT 1.0 07/02/2016 1025   BILITOT 1.2 (H) 01/09/2014 0950   GFRNONAA 58 (L) 07/02/2016 1025   GFRNONAA >60 01/09/2014 0950   GFRAA >60 07/02/2016 1025   GFRAA >60 01/09/2014 0950    No results found for: SPEP, UPEP  Lab Results  Component Value Date   WBC 8.7 07/02/2016   NEUTROABS 7.4 (H) 07/02/2016   HGB 12.5 07/02/2016   HCT 37.3 07/02/2016   MCV 79.6 (L) 07/02/2016   PLT 270 07/02/2016      Chemistry      Component Value Date/Time   NA 136 07/02/2016 1025   NA 136 01/09/2014 0950   K 4.0 07/02/2016 1025   K 4.4 01/09/2014 0950   CL 99 (L) 07/02/2016 1025   CL 100 01/09/2014 0950   CO2 32 07/02/2016 1025   CO2 31 01/09/2014 0950   BUN 13 07/02/2016 1025   BUN 12 01/09/2014 0950   CREATININE 0.93 07/02/2016 1025   CREATININE 0.85 01/09/2014 0950      Component Value Date/Time   CALCIUM 9.3 07/02/2016 1025   CALCIUM 9.1 01/09/2014 0950   ALKPHOS 63 07/02/2016 1025   ALKPHOS 76 01/09/2014 0950   AST 16 07/02/2016 1025   AST 24 01/09/2014 0950   ALT 12 (L) 07/02/2016 1025   ALT 22 01/09/2014 0950   BILITOT 1.0 07/02/2016 1025   BILITOT 1.2 (H) 01/09/2014 0950       RADIOGRAPHIC STUDIES: I have personally reviewed the radiological images as listed and agreed with the findings in the report. No results found.   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 Clinically no  evidence of recurrence  # Bilateral lung nodules-Restaging, follow-up bilateral pulmonary nodules CT scan shows stability of numerous subcentimeter bilateral pulmonary nodules. No new or progressive metastatic disease in the chest. Recommend follow-up CT scan in 6 months.  # Reviewed the blood work within normal limits.  # Follow-up with me in 6  months/labs CT scan prior.   Orders Placed This Encounter  Procedures  . CT CHEST WO CONTRAST    Standing Status:   Future    Standing Expiration Date:   08/30/2017    Order Specific Question:   Reason for Exam (SYMPTOM  OR DIAGNOSIS REQUIRED)    Answer:   Lung nodules    Order Specific Question:   Preferred imaging location?    Answer:   [facility]   All questions were answered. The patient knows to call the clinic with any problems, questions or concerns.    Faythe Casa, NP   Jacquelin Hawking, NP 07/02/2016 1:54 PM

## 2016-07-02 NOTE — Assessment & Plan Note (Addendum)
Stage I lung cancer status post resection 2005. Clinically no evidence of recurrence  # Bilateral lung nodules-Restaging, follow-up bilateral pulmonary nodules CT scan shows stability of numerous subcentimeter bilateral pulmonary nodules. No new or progressive metastatic disease in the chest. Recommend follow-up CT scan in 6 months.  # Reviewed the blood work within normal limits.  # Follow-up with me in 6 months/labs CT scan prior.

## 2016-08-02 ENCOUNTER — Other Ambulatory Visit: Payer: Self-pay | Admitting: Infectious Diseases

## 2016-08-02 DIAGNOSIS — Z1231 Encounter for screening mammogram for malignant neoplasm of breast: Secondary | ICD-10-CM

## 2016-08-30 ENCOUNTER — Ambulatory Visit
Admission: RE | Admit: 2016-08-30 | Discharge: 2016-08-30 | Disposition: A | Discharge status: Home / Self Care | Payer: Medicare Other | Source: Ambulatory Visit | Attending: Infectious Diseases | Admitting: Infectious Diseases

## 2016-08-30 DIAGNOSIS — Z1231 Encounter for screening mammogram for malignant neoplasm of breast: Secondary | ICD-10-CM

## 2016-12-29 ENCOUNTER — Ambulatory Visit: Payer: Medicare Other

## 2016-12-31 ENCOUNTER — Inpatient Hospital Stay: Payer: Medicare Other | Attending: Internal Medicine | Admitting: Internal Medicine

## 2016-12-31 VITALS — BP 110/72 | HR 75 | Temp 96.2°F | Resp 16 | Wt 153.4 lb

## 2016-12-31 DIAGNOSIS — Z85118 Personal history of other malignant neoplasm of bronchus and lung: Secondary | ICD-10-CM | POA: Diagnosis not present

## 2016-12-31 DIAGNOSIS — Z923 Personal history of irradiation: Secondary | ICD-10-CM | POA: Diagnosis not present

## 2016-12-31 DIAGNOSIS — I4891 Unspecified atrial fibrillation: Secondary | ICD-10-CM | POA: Insufficient documentation

## 2016-12-31 DIAGNOSIS — R918 Other nonspecific abnormal finding of lung field: Secondary | ICD-10-CM

## 2016-12-31 DIAGNOSIS — J45909 Unspecified asthma, uncomplicated: Secondary | ICD-10-CM | POA: Diagnosis not present

## 2016-12-31 DIAGNOSIS — C3411 Malignant neoplasm of upper lobe, right bronchus or lung: Secondary | ICD-10-CM

## 2016-12-31 NOTE — Progress Notes (Signed)
Patient is here today for a follow up. Patient states no new concerns today.  

## 2016-12-31 NOTE — Assessment & Plan Note (Addendum)
Stage I lung cancer status post resection 2005. Clinically no evidence of recurrence.   # Bilateral lung nodules-incidental surveillance bilateral pulmonary nodules CT scan shows stability of numerous subcentimeter bilateral pulmonary nodules from [ jan 5997]; will repeat scan in 3 months from now.  # Follow-up with me in 3 months/labs; CT scan prior.

## 2016-12-31 NOTE — Progress Notes (Signed)
[place] OFFICE PROGRESS NOTE  Patient Care Team: Leonel Ramsay, MD as PCP - General (Infectious Diseases)  Cancer Staging No matching staging information was found for the patient.   Oncology History   Carcinoma of lung, status post radiation therapy. Adenocarcinoma, T1N0M0. Stage I. 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  female pleasant patient above history of Stage I lung cancer is here For a follow-up.  Patient has intermittent history of vertigo; this is not getting worse.  Denies any worsening cough. Denies any worsening shortness of breath or chest pain or fevers. No nausea no vomiting. No headaches or bone pain.  REVIEW OF SYSTEMS:  A complete 10 point review of system is done which is negative except mentioned above/history of present illness.   PAST MEDICAL HISTORY :  Past Medical History:  Diagnosis Date  . Asthma   . Atrial fibrillation ([place])   . Lung cancer ([place])    10 yrs ago    PAST SURGICAL HISTORY :  No past surgical history on file.  FAMILY HISTORY :   Family History  Problem Relation Age of Onset  . Breast cancer Paternal Aunt     SOCIAL HISTORY:   Social History  Substance Use Topics  . Smoking status: Never Smoker  . Smokeless tobacco: Not on file  . Alcohol use No    ALLERGIES:  is allergic to poison oak extract; ampicillin; and penicillin v potassium.  MEDICATIONS:  Current Outpatient Prescriptions  Medication Sig Dispense Refill  . alendronate (FOSAMAX) 70 MG tablet Take by mouth.    . ALPRAZolam (XANAX) 0.25 MG tablet Take 0.25 mg by mouth at bedtime as needed.     Marland Kitchen apixaban (ELIQUIS) 5 MG TABS tablet Take 5 mg by mouth daily.     .  Cholecalciferol (VITAMIN D PO) Take by mouth.    . diltiazem (CARDIZEM CD) 120 MG 24 hr capsule Take 120 mg by mouth daily.     Marland Kitchen FLUoxetine (PROZAC) 20 MG capsule Take 20 mg by mouth daily.     . furosemide (LASIX) 20 MG tablet Take by mouth.    . lovastatin (MEVACOR) 20 MG tablet Take 20 mg by mouth daily at 6 PM.     . metoprolol succinate (TOPROL-XL) 50 MG 24 hr tablet Take by mouth.    . montelukast (SINGULAIR) 10 MG tablet Take 10 mg by mouth at bedtime.     . Multiple Vitamin (MULTI-VITAMINS) TABS Take by mouth.    Marland Kitchen omeprazole (PRILOSEC) 20 MG capsule Take 20 mg by mouth daily.     . furosemide (LASIX) 20 MG tablet Take by mouth.    . metoprolol succinate (TOPROL XL) 50 MG 24 hr tablet Take 1 tablet (50 mg total) by mouth daily. Take with or immediately following a meal. 30 tablet 11  . potassium chloride (K-DUR) 10 MEQ tablet Take by mouth.     No current facility-administered medications for this visit.     PHYSICAL EXAMINATION: ECOG PERFORMANCE STATUS: 0 - Asymptomatic  BP 110/72 (BP Location: Left Arm, Patient Position: Sitting)   Pulse 75   Temp (!) 96.2 F (35.7 C) (Tympanic)  Resp 16   Wt 153 lb 6.4 oz (69.6 kg)   BMI 30.98 kg/m   Filed Weights   12/31/16 1015  Weight: 153 lb 6.4 oz (69.6 kg)    GENERAL: Well-nourished well-developed; Alert, no distress and comfortable.  Accompanied by her husband. EYES: no pallor or icterus OROPHARYNX: no thrush or ulceration; good dentition  NECK: supple, no masses felt LYMPH:  no palpable lymphadenopathy in the cervical, axillary or inguinal regions LUNGS: clear to auscultation and  No wheeze or crackles HEART/CVS: regular rate & rhythm and no murmurs; No lower extremity edema ABDOMEN:abdomen soft, non-tender and normal bowel sounds Musculoskeletal:no cyanosis of digits and no clubbing  PSYCH: alert & oriented x 3 with fluent speech NEURO: no focal motor/sensory deficits SKIN:  no rashes or significant  lesions  LABORATORY DATA:  I have reviewed the data as listed    Component Value Date/Time   NA 136 07/02/2016 1025   NA 136 01/09/2014 0950   K 4.0 07/02/2016 1025   K 4.4 01/09/2014 0950   CL 99 (L) 07/02/2016 1025   CL 100 01/09/2014 0950   CO2 32 07/02/2016 1025   CO2 31 01/09/2014 0950   GLUCOSE 110 (H) 07/02/2016 1025   GLUCOSE 114 (H) 01/09/2014 0950   BUN 13 07/02/2016 1025   BUN 12 01/09/2014 0950   CREATININE 0.93 07/02/2016 1025   CREATININE 0.85 01/09/2014 0950   CALCIUM 9.3 07/02/2016 1025   CALCIUM 9.1 01/09/2014 0950   PROT 7.6 07/02/2016 1025   PROT 7.4 01/09/2014 0950   ALBUMIN 4.1 07/02/2016 1025   ALBUMIN 3.6 01/09/2014 0950   AST 16 07/02/2016 1025   AST 24 01/09/2014 0950   ALT 12 (L) 07/02/2016 1025   ALT 22 01/09/2014 0950   ALKPHOS 63 07/02/2016 1025   ALKPHOS 76 01/09/2014 0950   BILITOT 1.0 07/02/2016 1025   BILITOT 1.2 (H) 01/09/2014 0950   GFRNONAA 58 (L) 07/02/2016 1025   GFRNONAA >60 01/09/2014 0950   GFRAA >60 07/02/2016 1025   GFRAA >60 01/09/2014 0950    No results found for: SPEP, UPEP  Lab Results  Component Value Date   WBC 8.7 07/02/2016   NEUTROABS 7.4 (H) 07/02/2016   HGB 12.5 07/02/2016   HCT 37.3 07/02/2016   MCV 79.6 (L) 07/02/2016   PLT 270 07/02/2016      Chemistry      Component Value Date/Time   NA 136 07/02/2016 1025   NA 136 01/09/2014 0950   K 4.0 07/02/2016 1025   K 4.4 01/09/2014 0950   CL 99 (L) 07/02/2016 1025   CL 100 01/09/2014 0950   CO2 32 07/02/2016 1025   CO2 31 01/09/2014 0950   BUN 13 07/02/2016 1025   BUN 12 01/09/2014 0950   CREATININE 0.93 07/02/2016 1025   CREATININE 0.85 01/09/2014 0950      Component Value Date/Time   CALCIUM 9.3 07/02/2016 1025   CALCIUM 9.1 01/09/2014 0950   ALKPHOS 63 07/02/2016 1025   ALKPHOS 76 01/09/2014 0950   AST 16 07/02/2016 1025   AST 24 01/09/2014 0950   ALT 12 (L) 07/02/2016 1025   ALT 22 01/09/2014 0950   BILITOT 1.0 07/02/2016 1025   BILITOT  1.2 (H) 01/09/2014 0950       RADIOGRAPHIC STUDIES: I have personally reviewed the radiological images as listed and agreed with the findings in the report. No results found.   ASSESSMENT & PLAN:  Malignant neoplasm of upper lobe of right lung (  [place]) Stage I lung cancer status post resection 2005. Clinically no evidence of recurrence.   # Bilateral lung nodules-incidental surveillance bilateral pulmonary nodules CT scan shows stability of numerous subcentimeter bilateral pulmonary nodules from [ jan 5035]; will repeat scan in 3 months from now.  # Follow-up with me in 3 months/labs; CT scan prior.   Orders Placed This Encounter  Procedures  . CT CHEST W CONTRAST    Standing Status:   Future    Standing Expiration Date:   03/02/2018    Order Specific Question:   Reason for Exam (SYMPTOM  OR DIAGNOSIS REQUIRED)    Answer:   hx of lung cancer; lung nodules    Order Specific Question:   Preferred imaging location?    Answer:   [facility]  . CBC with Differential/Platelet    Standing Status:   Future    Standing Expiration Date:   12/31/2017  . Basic metabolic panel    Standing Status:   Future    Standing Expiration Date:   12/31/2017   All questions were answered. The patient knows to call the clinic with any problems, questions or concerns.    Faythe Casa, NP   Cammie Sickle, MD 12/31/2016 7:19 PM

## 2017-03-30 ENCOUNTER — Ambulatory Visit
Admission: RE | Admit: 2017-03-30 | Discharge: 2017-03-30 | Disposition: A | Discharge status: Home / Self Care | Payer: Medicare Other | Source: Ambulatory Visit | Attending: Internal Medicine | Admitting: Internal Medicine

## 2017-03-30 DIAGNOSIS — C3411 Malignant neoplasm of upper lobe, right bronchus or lung: Secondary | ICD-10-CM | POA: Insufficient documentation

## 2017-03-30 DIAGNOSIS — R918 Other nonspecific abnormal finding of lung field: Secondary | ICD-10-CM | POA: Insufficient documentation

## 2017-03-30 DIAGNOSIS — I700 Atherosclerosis of aorta: Secondary | ICD-10-CM | POA: Diagnosis not present

## 2017-03-30 DIAGNOSIS — I2510 Atherosclerotic heart disease of native coronary artery without angina pectoris: Secondary | ICD-10-CM | POA: Insufficient documentation

## 2017-03-30 LAB — POCT I-STAT CREATININE: CREATININE: 0.9 mg/dL (ref 0.44–1.00)

## 2017-03-30 MED ORDER — IOPAMIDOL (ISOVUE-300) INJECTION 61%
75.0000 mL | Freq: Once | INTRAVENOUS | Status: AC | PRN
  Administered 2017-03-30: 75 mL via INTRAVENOUS

## 2017-04-01 ENCOUNTER — Inpatient Hospital Stay (HOSPITAL_BASED_OUTPATIENT_CLINIC_OR_DEPARTMENT_OTHER): Payer: Medicare Other | Admitting: Internal Medicine

## 2017-04-01 ENCOUNTER — Other Ambulatory Visit: Payer: Self-pay | Admitting: *Deleted

## 2017-04-01 ENCOUNTER — Inpatient Hospital Stay: Payer: Medicare Other | Attending: Internal Medicine

## 2017-04-01 ENCOUNTER — Encounter: Payer: Self-pay | Admitting: Internal Medicine

## 2017-04-01 VITALS — BP 123/85 | HR 76 | Temp 98.0°F | Resp 12 | Ht 59.0 in | Wt 154.6 lb

## 2017-04-01 DIAGNOSIS — R918 Other nonspecific abnormal finding of lung field: Secondary | ICD-10-CM | POA: Insufficient documentation

## 2017-04-01 DIAGNOSIS — Z923 Personal history of irradiation: Secondary | ICD-10-CM | POA: Insufficient documentation

## 2017-04-01 DIAGNOSIS — Z79899 Other long term (current) drug therapy: Secondary | ICD-10-CM | POA: Insufficient documentation

## 2017-04-01 DIAGNOSIS — Z85118 Personal history of other malignant neoplasm of bronchus and lung: Secondary | ICD-10-CM | POA: Diagnosis not present

## 2017-04-01 DIAGNOSIS — I4891 Unspecified atrial fibrillation: Secondary | ICD-10-CM | POA: Diagnosis not present

## 2017-04-01 DIAGNOSIS — J45909 Unspecified asthma, uncomplicated: Secondary | ICD-10-CM | POA: Diagnosis not present

## 2017-04-01 DIAGNOSIS — C3411 Malignant neoplasm of upper lobe, right bronchus or lung: Secondary | ICD-10-CM

## 2017-04-01 LAB — CBC WITH DIFFERENTIAL/PLATELET
Basophils Absolute: 0.1 10*3/uL (ref 0–0.1)
Basophils Relative: 1 %
Eosinophils Absolute: 0.1 10*3/uL (ref 0–0.7)
Eosinophils Relative: 1 %
HEMATOCRIT: 38 % (ref 35.0–47.0)
HEMOGLOBIN: 12.9 g/dL (ref 12.0–16.0)
LYMPHS ABS: 0.8 10*3/uL — AB (ref 1.0–3.6)
LYMPHS PCT: 9 %
MCH: 28.2 pg (ref 26.0–34.0)
MCHC: 34.1 g/dL (ref 32.0–36.0)
MCV: 82.7 fL (ref 80.0–100.0)
Monocytes Absolute: 0.7 10*3/uL (ref 0.2–0.9)
Monocytes Relative: 7 %
NEUTROS PCT: 82 %
Neutro Abs: 7.9 10*3/uL — ABNORMAL HIGH (ref 1.4–6.5)
Platelets: 316 10*3/uL (ref 150–440)
RBC: 4.6 MIL/uL (ref 3.80–5.20)
RDW: 15.3 % — ABNORMAL HIGH (ref 11.5–14.5)
WBC: 9.5 10*3/uL (ref 3.6–11.0)

## 2017-04-01 LAB — BASIC METABOLIC PANEL
Anion gap: 9 (ref 5–15)
BUN: 17 mg/dL (ref 6–20)
CHLORIDE: 93 mmol/L — AB (ref 101–111)
CO2: 29 mmol/L (ref 22–32)
Calcium: 9.6 mg/dL (ref 8.9–10.3)
Creatinine, Ser: 0.73 mg/dL (ref 0.44–1.00)
GFR calc Af Amer: >60 mL/min (ref >60)
GFR calc non Af Amer: >60 mL/min (ref >60)
GLUCOSE: 112 mg/dL — AB (ref 65–99)
POTASSIUM: 4.3 mmol/L (ref 3.5–5.1)
Sodium: 131 mmol/L — ABNORMAL LOW (ref 135–145)

## 2017-04-01 NOTE — Progress Notes (Signed)
Patient here or results. No changes since last appointment.

## 2017-04-01 NOTE — Assessment & Plan Note (Signed)
Stage I lung cancer status post resection 2005. Clinically no evidence of recurrence.   # Bilateral lung nodules-incidental surveillance bilateral pulmonary nodules. CT scan from 03/30/17 similar to previous scan January 2018 showing stability of numerous subcentimeter bilateral pulmonary nodules. Plan to discuss patient at [facility] in regards to interval and duration of screening given stability of scans.   # CT of chest w contrast in 1 year, few days later labs and follow-up with MD.

## 2017-04-01 NOTE — Progress Notes (Signed)
[place] OFFICE PROGRESS NOTE  Patient Care Team: Leonel Ramsay, MD as PCP - General (Infectious Diseases)  Cancer Staging No matching staging information was found for the patient.   Oncology History   Carcinoma of lung, status post radiation therapy. Adenocarcinoma, T1N0M0. Stage I. 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  female pleasant patient above history of Stage I lung cancer is here for a follow-up and discussion of recent imaging.   Patient reports overall feeling well today. Denies worsening cough, sob, chest pain, malaise, or fatigue. Denies nausea, vomiting, or headaches.    REVIEW OF SYSTEMS:  A complete 10 point review of system is done which is negative except mentioned above/history of present illness.   PAST MEDICAL HISTORY :  Past Medical History:  Diagnosis Date  . Asthma   . Atrial fibrillation ([place])   . Lung cancer ([place])    10 yrs ago    PAST SURGICAL HISTORY :  History reviewed. No pertinent surgical history.  FAMILY HISTORY :   Family History  Problem Relation Age of Onset  . Breast cancer Paternal Aunt     SOCIAL HISTORY:   Social History  Substance Use Topics  . Smoking status: Never Smoker  . Smokeless tobacco: Never Used  . Alcohol use No    ALLERGIES:  is allergic to poison oak extract; ampicillin; and penicillin v potassium.  MEDICATIONS:  Current Outpatient Prescriptions  Medication Sig Dispense Refill  . alendronate (FOSAMAX) 70 MG tablet Take by mouth.    . ALPRAZolam (XANAX) 0.25 MG tablet Take 0.25 mg by mouth at bedtime as needed.     Marland Kitchen apixaban (ELIQUIS) 5 MG TABS tablet Take 5 mg by mouth daily.     . Cholecalciferol (VITAMIN D PO)  Take by mouth.    . diltiazem (CARDIZEM CD) 120 MG 24 hr capsule Take 120 mg by mouth daily.     Marland Kitchen FLUoxetine (PROZAC) 20 MG capsule Take 20 mg by mouth daily.     . furosemide (LASIX) 20 MG tablet Take by mouth.    . lovastatin (MEVACOR) 20 MG tablet Take 20 mg by mouth daily at 6 PM.     . metoprolol succinate (TOPROL XL) 50 MG 24 hr tablet Take 1 tablet (50 mg total) by mouth daily. Take with or immediately following a meal. 30 tablet 11  . montelukast (SINGULAIR) 10 MG tablet Take 10 mg by mouth at bedtime.     . Multiple Vitamin (MULTI-VITAMINS) TABS Take by mouth.    Marland Kitchen omeprazole (PRILOSEC) 20 MG capsule Take 20 mg by mouth daily.     . potassium chloride (K-DUR) 10 MEQ tablet Take by mouth.    . furosemide (LASIX) 20 MG tablet Take by mouth.     No current facility-administered medications for this visit.     PHYSICAL EXAMINATION: ECOG PERFORMANCE STATUS: 0 - Asymptomatic  BP 123/85 (BP Location: Left Arm, Patient Position: Sitting)   Pulse 76   Temp 98 F (36.7 C) (Tympanic)   Resp 12   Ht 4\' 11"  (1.499 m)   Wt 154 lb 9.6 oz (70.1 kg)   BMI 31.23 kg/m   [employer]  Weights   04/01/17 1335  Weight: 154 lb 9.6 oz (70.1 kg)    GENERAL: Well-nourished well-developed; Alert, no distress and comfortable.  Accompanied by her husband. EYES: no pallor or icterus OROPHARYNX: no thrush or ulceration; good dentition  NECK: supple, no masses felt LYMPH:  no palpable lymphadenopathy in the cervical, axillary or inguinal regions LUNGS: clear to auscultation and  No wheeze or crackles HEART/CVS: irregularly irregular rhythm; No lower extremity edema  ABDOMEN: abdomen soft, non-tender and normal bowel sounds Musculoskeletal:no cyanosis of digits and no clubbing  PSYCH: alert & oriented x 3 with fluent speech NEURO: no focal motor/sensory deficits SKIN:  no rashes or significant lesions  LABORATORY DATA:  I have reviewed the data as listed    Component Value Date/Time   NA 131 (L)  04/01/2017 1310   NA 136 01/09/2014 0950   K 4.3 04/01/2017 1310   K 4.4 01/09/2014 0950   CL 93 (L) 04/01/2017 1310   CL 100 01/09/2014 0950   CO2 29 04/01/2017 1310   CO2 31 01/09/2014 0950   GLUCOSE 112 (H) 04/01/2017 1310   GLUCOSE 114 (H) 01/09/2014 0950   BUN 17 04/01/2017 1310   BUN 12 01/09/2014 0950   CREATININE 0.73 04/01/2017 1310   CREATININE 0.85 01/09/2014 0950   CALCIUM 9.6 04/01/2017 1310   CALCIUM 9.1 01/09/2014 0950   PROT 7.6 07/02/2016 1025   PROT 7.4 01/09/2014 0950   ALBUMIN 4.1 07/02/2016 1025   ALBUMIN 3.6 01/09/2014 0950   AST 16 07/02/2016 1025   AST 24 01/09/2014 0950   ALT 12 (L) 07/02/2016 1025   ALT 22 01/09/2014 0950   ALKPHOS 63 07/02/2016 1025   ALKPHOS 76 01/09/2014 0950   BILITOT 1.0 07/02/2016 1025   BILITOT 1.2 (H) 01/09/2014 0950   GFRNONAA >60 04/01/2017 1310   GFRNONAA >60 01/09/2014 0950   GFRAA >60 04/01/2017 1310   GFRAA >60 01/09/2014 0950    No results found for: SPEP, UPEP  Lab Results  Component Value Date   WBC 9.5 04/01/2017   NEUTROABS 7.9 (H) 04/01/2017   HGB 12.9 04/01/2017   HCT 38.0 04/01/2017   MCV 82.7 04/01/2017   PLT 316 04/01/2017      Chemistry      Component Value Date/Time   NA 131 (L) 04/01/2017 1310   NA 136 01/09/2014 0950   K 4.3 04/01/2017 1310   K 4.4 01/09/2014 0950   CL 93 (L) 04/01/2017 1310   CL 100 01/09/2014 0950   CO2 29 04/01/2017 1310   CO2 31 01/09/2014 0950   BUN 17 04/01/2017 1310   BUN 12 01/09/2014 0950   CREATININE 0.73 04/01/2017 1310   CREATININE 0.85 01/09/2014 0950      Component Value Date/Time   CALCIUM 9.6 04/01/2017 1310   CALCIUM 9.1 01/09/2014 0950   ALKPHOS 63 07/02/2016 1025   ALKPHOS 76 01/09/2014 0950   AST 16 07/02/2016 1025   AST 24 01/09/2014 0950   ALT 12 (L) 07/02/2016 1025   ALT 22 01/09/2014 0950   BILITOT 1.0 07/02/2016 1025   BILITOT 1.2 (H) 01/09/2014 0950     CT CHEST W CONTRAST 03/30/17 IMPRESSION: 1. No interval change in multiple  bilateral pulmonary nodules measuring up to about 7 mm maximum size. No new pulmonary nodule or mass. 2. Stable appearance post treatment change right parahilar lung. 3. Coronary artery and Aortic Atherosclerois (ICD10-170.0)  Electronically Signed   By: Misty Stanley M.D.   On: 03/30/2017  11:08  RADIOGRAPHIC STUDIES: I have personally reviewed the radiological images as listed and agreed with the findings in the report. No results found.   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 Clinically no evidence of recurrence.   # Bilateral lung nodules-incidental surveillance bilateral pulmonary nodules. CT scan from 03/30/17 similar to previous scan January 2018 showing stability of numerous subcentimeter bilateral pulmonary nodules. Plan to discuss patient at Tumor Board in regards to interval and duration of screening given stability of scans.   # CT of chest w contrast in 1 year, few days later labs and follow-up with MD.   Orders Placed This Encounter  Procedures  . CT CHEST W CONTRAST    Standing Status:   Future    Standing Expiration Date:   04/01/2018    Scheduling Instructions:     12 months    Order Specific Question:   If indicated for the ordered procedure, I authorize the administration of contrast media per Radiology protocol    Answer:   Yes    Order Specific Question:   Preferred imaging location?    Answer:    [facility]    Order Specific Question:   Radiology Contrast Protocol - do NOT remove file path    Answer:   [email or web address]    Order Specific Question:   Reason for Exam additional comments    Answer:   hx of lung cancer; bil lung nodules   All questions were answered. The patient knows to call the clinic with any problems, questions or concerns.   Beckey Rutter, DNP AGNP-C 04/01/17 2:15 PM   Verlon Au, NP 04/01/2017 2:41 PM

## 2017-12-27 ENCOUNTER — Emergency Department: Payer: Medicare Other

## 2017-12-27 ENCOUNTER — Inpatient Hospital Stay
Admission: EM | Admit: 2017-12-27 | Discharge: 2018-01-22 | DRG: 853 | Disposition: A | Discharge status: Home Health | Payer: Medicare Other | Attending: Internal Medicine | Admitting: Internal Medicine

## 2017-12-27 ENCOUNTER — Other Ambulatory Visit: Payer: Self-pay

## 2017-12-27 DIAGNOSIS — C3411 Malignant neoplasm of upper lobe, right bronchus or lung: Secondary | ICD-10-CM | POA: Diagnosis present

## 2017-12-27 DIAGNOSIS — E782 Mixed hyperlipidemia: Secondary | ICD-10-CM | POA: Diagnosis present

## 2017-12-27 DIAGNOSIS — K631 Perforation of intestine (nontraumatic): Secondary | ICD-10-CM

## 2017-12-27 DIAGNOSIS — I482 Chronic atrial fibrillation, unspecified: Secondary | ICD-10-CM | POA: Diagnosis present

## 2017-12-27 DIAGNOSIS — A419 Sepsis, unspecified organism: Secondary | ICD-10-CM | POA: Diagnosis present

## 2017-12-27 DIAGNOSIS — I2510 Atherosclerotic heart disease of native coronary artery without angina pectoris: Secondary | ICD-10-CM | POA: Diagnosis present

## 2017-12-27 DIAGNOSIS — E871 Hypo-osmolality and hyponatremia: Secondary | ICD-10-CM | POA: Diagnosis not present

## 2017-12-27 DIAGNOSIS — E872 Acidosis, unspecified: Secondary | ICD-10-CM

## 2017-12-27 DIAGNOSIS — F419 Anxiety disorder, unspecified: Secondary | ICD-10-CM | POA: Diagnosis present

## 2017-12-27 DIAGNOSIS — I5043 Acute on chronic combined systolic (congestive) and diastolic (congestive) heart failure: Secondary | ICD-10-CM | POA: Diagnosis not present

## 2017-12-27 DIAGNOSIS — I4891 Unspecified atrial fibrillation: Secondary | ICD-10-CM | POA: Diagnosis not present

## 2017-12-27 DIAGNOSIS — F0390 Unspecified dementia without behavioral disturbance: Secondary | ICD-10-CM | POA: Diagnosis present

## 2017-12-27 DIAGNOSIS — R410 Disorientation, unspecified: Secondary | ICD-10-CM

## 2017-12-27 DIAGNOSIS — Z6841 Body Mass Index (BMI) 40.0 and over, adult: Secondary | ICD-10-CM

## 2017-12-27 DIAGNOSIS — Z881 Allergy status to other antibiotic agents status: Secondary | ICD-10-CM

## 2017-12-27 DIAGNOSIS — J9601 Acute respiratory failure with hypoxia: Secondary | ICD-10-CM | POA: Diagnosis not present

## 2017-12-27 DIAGNOSIS — E876 Hypokalemia: Secondary | ICD-10-CM | POA: Diagnosis present

## 2017-12-27 DIAGNOSIS — Z7189 Other specified counseling: Secondary | ICD-10-CM | POA: Diagnosis not present

## 2017-12-27 DIAGNOSIS — R000 Tachycardia, unspecified: Secondary | ICD-10-CM | POA: Diagnosis not present

## 2017-12-27 DIAGNOSIS — I700 Atherosclerosis of aorta: Secondary | ICD-10-CM | POA: Diagnosis present

## 2017-12-27 DIAGNOSIS — N179 Acute kidney failure, unspecified: Secondary | ICD-10-CM | POA: Diagnosis present

## 2017-12-27 DIAGNOSIS — R109 Unspecified abdominal pain: Secondary | ICD-10-CM

## 2017-12-27 DIAGNOSIS — G92 Toxic encephalopathy: Secondary | ICD-10-CM | POA: Diagnosis not present

## 2017-12-27 DIAGNOSIS — I10 Essential (primary) hypertension: Secondary | ICD-10-CM | POA: Diagnosis present

## 2017-12-27 DIAGNOSIS — R6520 Severe sepsis without septic shock: Secondary | ICD-10-CM | POA: Diagnosis not present

## 2017-12-27 DIAGNOSIS — K219 Gastro-esophageal reflux disease without esophagitis: Secondary | ICD-10-CM | POA: Diagnosis present

## 2017-12-27 DIAGNOSIS — J9690 Respiratory failure, unspecified, unspecified whether with hypoxia or hypercapnia: Secondary | ICD-10-CM

## 2017-12-27 DIAGNOSIS — R630 Anorexia: Secondary | ICD-10-CM

## 2017-12-27 DIAGNOSIS — E1165 Type 2 diabetes mellitus with hyperglycemia: Secondary | ICD-10-CM | POA: Diagnosis present

## 2017-12-27 DIAGNOSIS — I110 Hypertensive heart disease with heart failure: Secondary | ICD-10-CM | POA: Diagnosis present

## 2017-12-27 DIAGNOSIS — Z79899 Other long term (current) drug therapy: Secondary | ICD-10-CM

## 2017-12-27 DIAGNOSIS — R339 Retention of urine, unspecified: Secondary | ICD-10-CM | POA: Diagnosis not present

## 2017-12-27 DIAGNOSIS — Z888 Allergy status to other drugs, medicaments and biological substances status: Secondary | ICD-10-CM

## 2017-12-27 DIAGNOSIS — R451 Restlessness and agitation: Secondary | ICD-10-CM | POA: Diagnosis not present

## 2017-12-27 DIAGNOSIS — E870 Hyperosmolality and hypernatremia: Secondary | ICD-10-CM | POA: Diagnosis present

## 2017-12-27 DIAGNOSIS — Z923 Personal history of irradiation: Secondary | ICD-10-CM

## 2017-12-27 DIAGNOSIS — K449 Diaphragmatic hernia without obstruction or gangrene: Secondary | ICD-10-CM | POA: Diagnosis present

## 2017-12-27 DIAGNOSIS — Z880 Allergy status to penicillin: Secondary | ICD-10-CM

## 2017-12-27 DIAGNOSIS — Z4659 Encounter for fitting and adjustment of other gastrointestinal appliance and device: Secondary | ICD-10-CM

## 2017-12-27 DIAGNOSIS — I480 Paroxysmal atrial fibrillation: Secondary | ICD-10-CM | POA: Diagnosis not present

## 2017-12-27 DIAGNOSIS — Z7401 Bed confinement status: Secondary | ICD-10-CM

## 2017-12-27 DIAGNOSIS — K5720 Diverticulitis of large intestine with perforation and abscess without bleeding: Secondary | ICD-10-CM | POA: Diagnosis present

## 2017-12-27 DIAGNOSIS — Z515 Encounter for palliative care: Secondary | ICD-10-CM | POA: Diagnosis not present

## 2017-12-27 DIAGNOSIS — J9811 Atelectasis: Secondary | ICD-10-CM | POA: Diagnosis not present

## 2017-12-27 DIAGNOSIS — Z66 Do not resuscitate: Secondary | ICD-10-CM | POA: Diagnosis present

## 2017-12-27 DIAGNOSIS — Z85118 Personal history of other malignant neoplasm of bronchus and lung: Secondary | ICD-10-CM

## 2017-12-27 DIAGNOSIS — Z452 Encounter for adjustment and management of vascular access device: Secondary | ICD-10-CM

## 2017-12-27 DIAGNOSIS — D649 Anemia, unspecified: Secondary | ICD-10-CM | POA: Diagnosis present

## 2017-12-27 DIAGNOSIS — I9581 Postprocedural hypotension: Secondary | ICD-10-CM | POA: Diagnosis not present

## 2017-12-27 DIAGNOSIS — R0603 Acute respiratory distress: Secondary | ICD-10-CM

## 2017-12-27 DIAGNOSIS — E8809 Other disorders of plasma-protein metabolism, not elsewhere classified: Secondary | ICD-10-CM | POA: Diagnosis present

## 2017-12-27 DIAGNOSIS — Z7901 Long term (current) use of anticoagulants: Secondary | ICD-10-CM

## 2017-12-27 DIAGNOSIS — T402X5A Adverse effect of other opioids, initial encounter: Secondary | ICD-10-CM | POA: Diagnosis not present

## 2017-12-27 DIAGNOSIS — Z9049 Acquired absence of other specified parts of digestive tract: Secondary | ICD-10-CM

## 2017-12-27 DIAGNOSIS — R7303 Prediabetes: Secondary | ICD-10-CM | POA: Diagnosis present

## 2017-12-27 DIAGNOSIS — I509 Heart failure, unspecified: Secondary | ICD-10-CM

## 2017-12-27 DIAGNOSIS — Z91048 Other nonmedicinal substance allergy status: Secondary | ICD-10-CM

## 2017-12-27 DIAGNOSIS — R110 Nausea: Secondary | ICD-10-CM

## 2017-12-27 HISTORY — DX: Heart failure, unspecified: I50.9

## 2017-12-27 LAB — URINALYSIS, COMPLETE (UACMP) WITH MICROSCOPIC
Bilirubin Urine: NEGATIVE
GLUCOSE, UA: NEGATIVE mg/dL
Ketones, ur: NEGATIVE mg/dL
Leukocytes, UA: NEGATIVE
NITRITE: NEGATIVE
PH: 5 (ref 5.0–8.0)
PROTEIN: 100 mg/dL — AB

## 2017-12-27 LAB — BASIC METABOLIC PANEL
ANION GAP: 9 (ref 5–15)
BUN: 33 mg/dL — ABNORMAL HIGH (ref 8–23)
CHLORIDE: 99 mmol/L (ref 98–111)
CO2: 27 mmol/L (ref 22–32)
Calcium: 8 mg/dL — ABNORMAL LOW (ref 8.9–10.3)
Creatinine, Ser: 1.18 mg/dL — ABNORMAL HIGH (ref 0.44–1.00)
GFR calc Af Amer: 49 mL/min — ABNORMAL LOW (ref >60)
GFR calc non Af Amer: 43 mL/min — ABNORMAL LOW (ref >60)
GLUCOSE: 165 mg/dL — AB (ref 70–99)
POTASSIUM: 3.7 mmol/L (ref 3.5–5.1)
Sodium: 135 mmol/L (ref 135–145)

## 2017-12-27 LAB — CBC
HCT: 38.9 % (ref 35.0–47.0)
Hemoglobin: 13.2 g/dL (ref 12.0–16.0)
MCH: 28 pg (ref 26.0–34.0)
MCHC: 33.9 g/dL (ref 32.0–36.0)
MCV: 82.7 fL (ref 80.0–100.0)
PLATELETS: 331 10*3/uL (ref 150–440)
RBC: 4.7 MIL/uL (ref 3.80–5.20)
RDW: 15.4 % — ABNORMAL HIGH (ref 11.5–14.5)
WBC: 21.6 10*3/uL — AB (ref 3.6–11.0)

## 2017-12-27 LAB — BLOOD GAS, ARTERIAL
Acid-base deficit: 2.7 mmol/L — ABNORMAL HIGH (ref 0.0–2.0)
BICARBONATE: 20.6 mmol/L (ref 20.0–28.0)
FIO2: 21
O2 Saturation: 92.1 %
PO2 ART: 62 mmHg — AB (ref 83.0–108.0)
Patient temperature: 37
pCO2 arterial: 31 mmHg — ABNORMAL LOW (ref 32.0–48.0)
pH, Arterial: 7.43 (ref 7.350–7.450)

## 2017-12-27 LAB — LACTIC ACID, PLASMA
LACTIC ACID, VENOUS: 3.1 mmol/L — AB (ref 0.5–1.9)
Lactic Acid, Venous: 3.2 mmol/L (ref 0.5–1.9)

## 2017-12-27 LAB — TROPONIN I: Troponin I: <0.03 ng/mL (ref <0.03)

## 2017-12-27 LAB — MRSA PCR SCREENING: MRSA by PCR: NEGATIVE

## 2017-12-27 MED ORDER — LEVOFLOXACIN IN D5W 750 MG/150ML IV SOLN: 750.0000 mg | INTRAVENOUS | Status: DC

## 2017-12-27 MED ORDER — METOPROLOL TARTRATE 5 MG/5ML IV SOLN
2.5000 mg | Freq: Once | INTRAVENOUS | Status: AC
  Administered 2017-12-28: 2.5 mg via INTRAVENOUS
  Filled 2017-12-27: qty 5

## 2017-12-27 MED ORDER — SODIUM CHLORIDE 0.9 % IV BOLUS
1000.0000 mL | Freq: Once | INTRAVENOUS | Status: AC
Start: 2017-12-27 — End: 2017-12-27
  Administered 2017-12-27: 1000 mL via INTRAVENOUS

## 2017-12-27 MED ORDER — METRONIDAZOLE IN NACL 5-0.79 MG/ML-% IV SOLN
500.0000 mg | Freq: Three times a day (TID) | INTRAVENOUS | Status: DC
  Administered 2017-12-27 – 2018-01-05 (×28): 500 mg via INTRAVENOUS
  Filled 2017-12-27 (×31): qty 100

## 2017-12-27 MED ORDER — AMIODARONE HCL IN DEXTROSE 360-4.14 MG/200ML-% IV SOLN
30.0000 mg/h | INTRAVENOUS | Status: DC
  Administered 2017-12-28 – 2017-12-29 (×5): 60 mg/h via INTRAVENOUS
  Administered 2017-12-29: 30 mg/h via INTRAVENOUS
  Administered 2017-12-29: 60 mg/h via INTRAVENOUS
  Administered 2017-12-30 – 2018-01-16 (×35): 30 mg/h via INTRAVENOUS
  Filled 2017-12-27 (×22): qty 200
  Filled 2017-12-27: qty 400
  Filled 2017-12-27 (×20): qty 200

## 2017-12-27 MED ORDER — VANCOMYCIN HCL IN DEXTROSE 1-5 GM/200ML-% IV SOLN
1000.0000 mg | Freq: Once | INTRAVENOUS | Status: AC
  Administered 2017-12-27: 1000 mg via INTRAVENOUS
  Filled 2017-12-27: qty 200

## 2017-12-27 MED ORDER — DILTIAZEM HCL 25 MG/5ML IV SOLN
15.0000 mg | Freq: Once | INTRAVENOUS | Status: AC
  Administered 2017-12-27: 15 mg via INTRAVENOUS

## 2017-12-27 MED ORDER — AMIODARONE IV BOLUS ONLY 150 MG/100ML
150.0000 mg | Freq: Once | INTRAVENOUS | Status: AC
  Administered 2017-12-27: 150 mg via INTRAVENOUS
  Filled 2017-12-27: qty 100

## 2017-12-27 MED ORDER — ENOXAPARIN SODIUM 40 MG/0.4ML ~~LOC~~ SOLN
40.0000 mg | SUBCUTANEOUS | Status: DC
  Administered 2017-12-28 – 2018-01-02 (×6): 40 mg via SUBCUTANEOUS
  Filled 2017-12-27 (×5): qty 0.4

## 2017-12-27 MED ORDER — CHLORHEXIDINE GLUCONATE 0.12 % MT SOLN
15.0000 mL | Freq: Two times a day (BID) | OROMUCOSAL | Status: DC
  Administered 2017-12-28 – 2018-01-07 (×17): 15 mL via OROMUCOSAL
  Filled 2017-12-27 (×10): qty 15

## 2017-12-27 MED ORDER — FAMOTIDINE IN NACL 20-0.9 MG/50ML-% IV SOLN
20.0000 mg | INTRAVENOUS | Status: DC
  Administered 2017-12-27 – 2017-12-30 (×4): 20 mg via INTRAVENOUS
  Filled 2017-12-27 (×3): qty 50

## 2017-12-27 MED ORDER — DIGOXIN 0.25 MG/ML IJ SOLN
0.5000 mg | Freq: Once | INTRAMUSCULAR | Status: AC
  Administered 2017-12-27: 0.5 mg via INTRAVENOUS
  Filled 2017-12-27: qty 2

## 2017-12-27 MED ORDER — METOPROLOL TARTRATE 5 MG/5ML IV SOLN
2.5000 mg | Freq: Once | INTRAVENOUS | Status: AC
  Administered 2017-12-27: 2.5 mg via INTRAVENOUS
  Filled 2017-12-27: qty 5

## 2017-12-27 MED ORDER — DIGOXIN 0.25 MG/ML IJ SOLN
0.1250 mg | Freq: Every day | INTRAMUSCULAR | Status: DC
  Administered 2017-12-28 – 2018-01-15 (×18): 0.125 mg via INTRAVENOUS
  Filled 2017-12-27 (×17): qty 2

## 2017-12-27 MED ORDER — DILTIAZEM HCL 100 MG IV SOLR
5.0000 mg/h | Freq: Once | INTRAVENOUS | Status: AC
  Administered 2017-12-27: 5 mg/h via INTRAVENOUS
  Filled 2017-12-27: qty 100

## 2017-12-27 MED ORDER — LACTATED RINGERS IV SOLN
INTRAVENOUS | Status: DC
  Administered 2017-12-27 – 2018-01-01 (×6): via INTRAVENOUS

## 2017-12-27 MED ORDER — LEVOFLOXACIN IN D5W 750 MG/150ML IV SOLN
750.0000 mg | Freq: Once | INTRAVENOUS | Status: DC
  Administered 2017-12-27: 750 mg via INTRAVENOUS
  Filled 2017-12-27: qty 150

## 2017-12-27 MED ORDER — DIGOXIN 0.25 MG/ML IJ SOLN
0.2500 mg | Freq: Once | INTRAMUSCULAR | Status: DC
  Filled 2017-12-27: qty 2

## 2017-12-27 MED ORDER — SODIUM CHLORIDE 0.9 % IV BOLUS
1000.0000 mL | Freq: Once | INTRAVENOUS | Status: AC
  Administered 2017-12-27: 1000 mL via INTRAVENOUS

## 2017-12-27 MED ORDER — AMIODARONE HCL IN DEXTROSE 360-4.14 MG/200ML-% IV SOLN
60.0000 mg/h | INTRAVENOUS | Status: AC
  Administered 2017-12-27 (×2): 60 mg/h via INTRAVENOUS
  Filled 2017-12-27 (×2): qty 200

## 2017-12-27 MED ORDER — DILTIAZEM HCL 25 MG/5ML IV SOLN
INTRAVENOUS | Status: AC
  Filled 2017-12-27: qty 5

## 2017-12-27 MED ORDER — DILTIAZEM HCL 100 MG IV SOLR
5.0000 mg/h | INTRAVENOUS | Status: DC
  Filled 2017-12-27: qty 100

## 2017-12-27 MED ORDER — DILTIAZEM HCL-DEXTROSE 100-5 MG/100ML-% IV SOLN (PREMIX)
5.0000 mg/h | INTRAVENOUS | Status: DC
  Filled 2017-12-27: qty 100

## 2017-12-27 MED ORDER — IOPAMIDOL (ISOVUE-300) INJECTION 61%
75.0000 mL | Freq: Once | INTRAVENOUS | Status: AC | PRN
  Administered 2017-12-27: 11:00:00 via INTRAVENOUS

## 2017-12-27 MED ORDER — DILTIAZEM HCL-DEXTROSE 100-5 MG/100ML-% IV SOLN (PREMIX): 5.0000 mg/h | INTRAVENOUS | Status: DC

## 2017-12-27 MED ORDER — ACETAMINOPHEN 325 MG PO TABS: 650.0000 mg | ORAL_TABLET | Freq: Four times a day (QID) | ORAL | Status: DC | PRN

## 2017-12-27 MED ORDER — ORAL CARE MOUTH RINSE
15.0000 mL | Freq: Two times a day (BID) | OROMUCOSAL | Status: DC
  Administered 2017-12-28 – 2018-01-04 (×13): 15 mL via OROMUCOSAL

## 2017-12-27 MED ORDER — DIGOXIN 0.25 MG/ML IJ SOLN
0.2500 mg | Freq: Once | INTRAMUSCULAR | Status: AC
  Administered 2017-12-28: 0.25 mg via INTRAVENOUS
  Filled 2017-12-27: qty 2

## 2017-12-27 MED ORDER — MORPHINE SULFATE (PF) 2 MG/ML IV SOLN
2.0000 mg | INTRAVENOUS | Status: DC | PRN
  Administered 2017-12-27 – 2017-12-28 (×2): 2 mg via INTRAVENOUS
  Filled 2017-12-27 (×2): qty 1

## 2017-12-27 MED ORDER — ONDANSETRON HCL 4 MG/2ML IJ SOLN
4.0000 mg | Freq: Four times a day (QID) | INTRAMUSCULAR | Status: DC | PRN
  Administered 2017-12-27 – 2017-12-29 (×3): 4 mg via INTRAVENOUS
  Filled 2017-12-27 (×4): qty 2

## 2017-12-27 MED ORDER — AZTREONAM 1 G IJ SOLR
1.0000 g | Freq: Three times a day (TID) | INTRAMUSCULAR | Status: DC
  Administered 2017-12-27: 1 g via INTRAVENOUS
  Filled 2017-12-27: qty 1

## 2017-12-27 NOTE — ED Notes (Signed)
Patient transported to CT 

## 2017-12-27 NOTE — ED Notes (Signed)
Pt cleaned up and repositioned in bed. Family at bedside

## 2017-12-27 NOTE — H&P (Addendum)
Subjective:   CC: abdominal pain  HPI:  Jody [name] is a 77 y.o. female who was referred by Nance Pear for abdominal pain.  Symptoms were first noted 2 days ago. Pain is sharp, non-radiating, focal to abdomen.  Associated with nausea, exacerbated by palpation.  Pain continued to worsen so was brought by ambulance to ED.  Workup showed possible perforated diverticulitis so surgery consulted. She was also noted to be in Afib with RVR so was started on diltiazem drip.  Currently able to answer questions appropriately, with some help from husband.  Last BM yesterday.   Past Medical History:  has a past medical history of Asthma, Atrial fibrillation ([place]), and Lung cancer ([place]). also HTN, borderline DM, per chart review.  Past Surgical History: No past surgical history on file.  Family History: family history includes Breast cancer in her paternal aunt.  Social History:  reports that she has never smoked. She has never used smokeless tobacco. She reports that she does not drink alcohol. Her drug history is not on file.  Current Medications:  alendronate (FOSAMAX) 70 MG tablet 12/27/17 1204 Do Not Order for Admission  ALPRAZolam (XANAX) 0.25 MG tablet 12/27/17 1204 Do Not Order for Admission  apixaban (ELIQUIS) 5 MG TABS tablet 12/27/17 1204 Do Not Order for Admission  Calcium Carbonate-Vitamin D (CALCIUM 600+D) 600-200 MG-UNIT TABS 12/27/17 1204 Do Not Order for Admission  diltiazem (DILACOR XR) 240 MG 24 hr capsule 12/27/17 1204 Do Not Order for Admission  FLUoxetine (PROZAC) 20 MG capsule 12/27/17 1204 Do Not Order for Admission  furosemide (LASIX) 20 MG tablet 12/27/17 1204 Do Not Order for Admission  lovastatin (MEVACOR) 20 MG tablet 12/27/17 1204 Do Not Order for Admission  metoprolol succinate (TOPROL XL) 50 MG 24 hr tablet 12/27/17 1204 Do Not Order for Admission  Multiple Vitamin (MULTI-VITAMINS) TABS 12/27/17 1204 Do Not Order for Admission  omeprazole (PRILOSEC) 20 MG capsule  12/27/17 1204 Do Not Order for Admission  potassium chloride (K-DUR,KLOR-CON) 10 MEQ tablet 12/27/17 1204 Do Not Order for Admission     Allergies:  Allergies as of 12/27/2017 - Review Complete 12/27/2017  Allergen Reaction Noted  . Poison oak extract Other (See Comments) 01/13/2015  . Ampicillin Rash 01/13/2015  . Penicillin v potassium Rash 01/13/2015    ROS:  A 15 point review of systems was performed and pertinent positives and negatives noted in HPI   Objective:     BP 135/73   Pulse (!) 137   Temp 98.2 F (36.8 C) (Oral)   Resp (!) 33   Ht 4\' 11"  (1.499 m)   Wt 72.6 kg (160 lb)   SpO2 97%   BMI 32.32 kg/m   Constitutional :  alert, cooperative, appears stated age and no distress  Lymphatics/Throat:  no asymmetry, masses, or scars  Respiratory:  clear to auscultation bilaterally  Cardiovascular:  irregularly irregular rhythm  Gastrointestinal: soft, non-tender; bowel sounds normal; no masses,  no organomegaly and focal tenderness noted in RLQ, infraumbilical region, but non-tender in remaining areas, with zero guarding.  Abdomen is soft, no rebound,  no peritoneal irritation with movement..    Musculoskeletal: Steady movement  Skin: Cool and moist,  surgical scars   Psychiatric: Normal affect, non-agitated, not confused       LABS:  Creatinine increase to 1.8 Wbc of 21.6  RADS: Study Result   CLINICAL DATA:  77 year old female with abdominal pain and emesis for 2 days. History of high blood pressure end stage I  right upper lobe lung cancer post radiation therapy. Initial encounter.  EXAM: CT ABDOMEN AND PELVIS WITH CONTRAST  TECHNIQUE: Multidetector CT imaging of the abdomen and pelvis was performed using the standard protocol following bolus administration of intravenous contrast.  CONTRAST:  <See Chart> ISOVUE-300 IOPAMIDOL (ISOVUE-300) INJECTION 61%  COMPARISON:  03/30/2017 chest CT. 12/27/2017 chest x-ray. 12/17/2011 CT abdomen and  pelvis.  FINDINGS: Lower chest: Stable right lower lobe 5.5 mm nodule (series 4, image 1). Stable right lower lobe 4.8 mm nodule (series 4, image 10). Stable left lower lobe 4.9 mm nodule (series 4, image 2). Stable 2.6 mm nodule left lung base (series 4, image 8). Basilar atelectasis/scarring.  Cardiomegaly. Coronary artery calcifications. Moderate size hiatal hernia post prior surgery.  Hepatobiliary: Top-normal size liver. Stable 1.2 mm low-density structure possibly a cyst dome of the liver. Post cholecystectomy. Perihepatic fluid.  Pancreas: No worrisome pancreatic mass or primary pancreatic inflammation.  Spleen: Stable large calcified low-density lesion within the spleen.  Adrenals/Urinary Tract: Nodularity left adrenal gland similar to prior chest CT. No right adrenal lesion.  No obstructing stone or hydronephrosis. Left parapelvic cyst. No worrisome renal lesion.  Noncontrast filled imaging urinary bladder unremarkable.  Stomach/Bowel: Diffuse inflammatory process extends between the descending colon/sigmoid colon and distal jejunal loops. Significant number of diverticula seen throughout the colon and it is possible findings are related to perforated diverticulitis however, distal jejunum appears markedly abnormal with thickened hyperemic walls and possibility of areas of wall interruption (series 5, image 22 and series 5, image 26).  Perforated bowel causes gas and fluid collection insinuating between jejunal loops (largest collection measures 8.8 x 2.1 x 2.9 cm with smaller collection measuring 3 x 2.5 x 3.2 cm). Gas and fluid collection along the right lateral aspect of the dome of the bladder spans over 6.2 x 4.2 x 3 cm. Fluid and tiny amount of gas left paracolic gutter. Fluid extends to surround the liver and spleen. Fluid right femoral/inguinal region could represent dissection of inflammatory process into this region spanning over 4.2 x 5 x  2.8 cm.  Moderate-size hiatal hernia post attempted repair.  Vascular/Lymphatic: Atherosclerotic changes aorta and aortic branch vessels. No abdominal aortic aneurysm or large vessel occlusion.  Scattered lymph nodes possibly reactive in origin.  Reproductive: No primary uterine or adnexal abnormality noted although immediately adjacent to inflammatory process.  Other: As above.  Musculoskeletal: No acute osseous abnormality.  IMPRESSION: 1. Diffuse inflammatory process extends between the descending colon/sigmoid colon and distal jejunal loops. Significant number of diverticula seen throughout the colon and it is possible findings are related to perforated diverticulitis however, distal jejunum appear markedly abnormal with thickened hyperemic walls, possibility with areas of wall interruption/perforation (series 5, image 22 and series 5, image 26). 2. Perforated bowel causes gas and fluid collection insinuating between jejunal loops (largest collection measures 8.8 x 2.1 x 2.9 cm with smaller collection measuring 3 x 2.5 x 3.2 cm). Gas and fluid collection along the right lateral aspect of the dome of the bladder spans over 6.2 x 4.2 x 3 cm. Fluid and tiny amount of gas left paracolic gutter. Fluid extends to surround the liver and spleen. Fluid right femoral/inguinal region could represent dissection of inflammatory process into this region spanning over 4.2 x 5 x 2.8 cm. 3. Moderate size hiatal hernia. 4. Stable bilateral pulmonary nodules, large low-density/cystic calcified splenic lesion, dome right lobe liver lesion and nodularity left adrenal gland as detailed above. 5. Cardiomegaly.  Coronary artery calcifications. 6.  Aortic Atherosclerosis (ICD10-I70.0).  These results were called by telephone at the time of interpretation on 12/27/2017 at 10:59 am to Dr. Nance Pear , who verbally acknowledged these results.   Electronically Signed   By: Genia Del M.D.   On: 12/27/2017 11:33     Assessment:     abdominal pain , perforated colon diverticulitis vs small bowel perforation.  afib with rvr HTN  Plan:     1.abdominal pain perforated diverticulitis vs small bowel perforation. Based on two day duration of said symptoms and proximity of the inflammatory process noted on jejunum to colon diverticulitis, i'm currently favoring diverticulitis vs spontaneous perforation of small bowel.  Her abdominal exam is reassuring that this could potentially remain localized despite the level and inflammation and fluid noted on CT.  Her use of Eliquis, and currently being in afib with RVR places her at a very high risk for immediate surgical exploration and intervention as well, so best option at this point is serial abdominal exams, and IV abx to see if any improvement in symptoms.  She will be monitored very closely for any changes in her exam.  Explained in detail to to patient and family at bedside and they currently agree to plan at this time.  2. Afib wit RVR.  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  Will continue with dilitiazem drip for rate control and monitor in step-down unit per intensivist recommendation.  They will be on consult for medical mangaement.  3.hx of HTN Management per intensivist as needed.  Admit: step down IVF: LR@100ml  Abx: aztreonam, levaquin, vanc started by ED. DVT prophy: lovenox prophy dose until final decision made regarding surgical intervention. SCDs GI prophy: pepcid Diet: NPO, sips with meds Pain control: morphine prn I&O: qshift Activity: adlib Labs: daily  CBC, BMP, mg, phos, lactate

## 2017-12-27 NOTE — Progress Notes (Signed)
RN spoke with Dr. Mortimer Fries and made MD aware that patient continues to be in Afib rate 140's on amiodarone drip.  MD acknowledged and gave order for cardiology consult.

## 2017-12-27 NOTE — Consult Note (Signed)
[facility] Consultation      Name: Jody [name] MRN: [id number] DOB: Jun 30, 1940    ADMISSION DATE:  12/27/2017 CONSULTATION DATE:  12/27/2017  REFERRING MD :  Lysle Pearl   CHIEF COMPLAINT:   Abdominal pain   HISTORY OF PRESENT ILLNESS   77 year old female with afib, asthma, and history of lung cancer who presented to the emergency department this morning with chief compliant of abdominal pain. Patient states the abdominal pain began Sunday evening and has progressively gotten worse which is why she came to the hospital. She is also c/o nausea, vomiting "phlegm", and constipation (last BM 3 days ago). She has associated SOB which she states she has occasionally, but that it is worse than usual. She is unable to speak in complete sentences.  Patient noted to be in atrial fibrillation upon arrival to the ED. She has a known history of afib and is followed by Dr. Nehemiah Massed. Furthermore, she has a history of lung cancer that is in remission with stable bilateral pulmonary nodules followed by Dr. Rogue Bussing.  History is limited as the patient appears very uncomfortable and is having difficulty answering questions secondary to pain.    SIGNIFICANT EVENTS   While in the ED patient underwent CT scan of abdomen which showed significant inflammation of bowel from the distal jejunum to the descending/sigmoid colon vs perforated bowel, possibly secondary to diverticulitis. Patient started on levaquin and flagyll for antibiotic coverage  PAST MEDICAL HISTORY    :  Past Medical History:  Diagnosis Date  . Asthma   . Atrial fibrillation ([place])   . Lung cancer ([place])    10 yrs ago   No past surgical history on file. Prior to Admission medications   Medication Sig Start Date End Date Taking? Authorizing Provider  alendronate (FOSAMAX) 70 MG tablet Take 70 mg by mouth every Monday.    Yes [provider]  ALPRAZolam (XANAX) 0.25 MG tablet Take 0.25 mg by mouth at bedtime  as needed for anxiety or sleep.    Yes [provider]  apixaban (ELIQUIS) 5 MG TABS tablet Take 5 mg by mouth every 12 (twelve) hours.    Yes [provider]  Calcium Carbonate-Vitamin D (CALCIUM 600+D) 600-200 MG-UNIT TABS Take 1 tablet by mouth daily.   Yes [provider]  diltiazem (DILACOR XR) 240 MG 24 hr capsule Take 240 mg by mouth daily.   Yes [provider]  FLUoxetine (PROZAC) 20 MG capsule Take 20 mg by mouth daily.  02/20/14  Yes [provider]  furosemide (LASIX) 20 MG tablet Take 20 mg by mouth daily.   Yes [provider]  lovastatin (MEVACOR) 20 MG tablet Take 20 mg by mouth daily with supper.    Yes [provider]  metoprolol succinate (TOPROL XL) 50 MG 24 hr tablet Take 1 tablet (50 mg total) by mouth daily. Take with or immediately following a meal. Patient taking differently: Take 50 mg by mouth 2 (two) times daily.  02/10/15 12/27/17 Yes Daymon Larsen, MD  montelukast (SINGULAIR) 10 MG tablet Take 10 mg by mouth at bedtime.  08/23/14  Yes [provider]  Multiple Vitamin (MULTI-VITAMINS) TABS Take 1 tablet by mouth daily.    Yes [provider]  omeprazole (PRILOSEC) 20 MG capsule Take 20 mg by mouth daily.  08/15/14  Yes [provider]  potassium chloride (K-DUR,KLOR-CON) 10 MEQ tablet Take 10 mEq by mouth daily.   Yes [provider]  metoprolol tartrate (LOPRESSOR) 25 MG tablet Take by mouth. 07/03/14 02/10/15  [provider]   Allergies  Allergen Reactions  . Poison Oak Extract Other (See Comments)    blistering  . Ampicillin Rash  . Penicillin V Potassium Rash    Has patient had a PCN reaction causing immediate rash, facial/tongue/throat swelling, SOB or lightheadedness with hypotension: No Has patient had a PCN reaction causing severe rash involving mucus membranes or skin necrosis: No Has patient had a PCN reaction that required hospitalization: No Has  patient had a PCN reaction occurring within the last 10 years: No If all of the above answers are "NO", then may proceed with Cephalosporin use.      FAMILY HISTORY   Family History  Problem Relation Age of Onset  . Breast cancer Paternal Aunt       SOCIAL HISTORY    reports that she has never smoked. She has never used smokeless tobacco. She reports that she does not drink alcohol. Her drug history is not on file.  Review of Systems  Constitutional: Negative for chills, diaphoresis and fever.  HENT: Negative for congestion, ear pain, sinus pain and sore throat.   Eyes: Negative for pain and discharge.  Respiratory: Positive for shortness of breath. Negative for cough, sputum production and wheezing.   Cardiovascular: Negative for chest pain, palpitations, orthopnea, claudication and leg swelling.  Gastrointestinal: Positive for abdominal pain, constipation, nausea and vomiting. Negative for blood in stool, diarrhea and heartburn.  Genitourinary: Negative for dysuria, frequency and urgency.  Skin: Negative for itching and rash.  Neurological: Negative for dizziness, speech change, seizures, weakness and headaches.  Psychiatric/Behavioral: Negative for depression.      VITAL SIGNS    Temp:  [97.1 F (36.2 C)-98.2 F (36.8 C)] 97.1 F (36.2 C) (07/23 1251) Pulse Rate:  [34-177] 134 (07/23 1255) Resp:  [20-44] 21 (07/23 1255) BP: (80-138)/(59-87) 106/74 (07/23 1255) SpO2:  [93 %-98 %] 94 % (07/23 1255) Weight:  [72.6 kg (160 lb)] 72.6 kg (160 lb) (07/23 0853)    INTAKE / OUTPUT:  Intake/Output Summary (Last 24 hours) at 12/27/2017 1331 Last data filed at 12/27/2017 1108 Gross per 24 hour  Intake 100 ml  Output -  Net 100 ml       PHYSICAL EXAM   Physical Exam  Constitutional: She appears ill. She appears distressed.  HENT:  Head: Normocephalic and atraumatic.  Eyes: Pupils are equal, round, and reactive to light. EOM are normal.  Cardiovascular:    Irregular rate and rhythm. S1 and S2, no murmurs/rubs/gallops  Pulmonary/Chest:  Tachypnea, shallow breaths, clear to auscultation anteriorly  Abdominal:  Distended abdomen that is diffusely tender to palpation, involuntary guarding, with rebound tenderness. bowel sounds present  Musculoskeletal: Normal range of motion. She exhibits no edema.  Lymphadenopathy:    She has no cervical adenopathy.  Neurological: No cranial nerve deficit.  Alert and oriented to person and place, having difficulty answering questions. 5/5 strength of all extremities  Skin: Skin is warm and dry. Capillary refill takes less than 2 seconds.       LABS   LABS:  CBC Recent Labs  Lab 12/27/17 0847  WBC 21.6*  HGB 13.2  HCT 38.9  PLT 331   Coag's No results for input(s): APTT, INR in the last 168 hours. BMET Recent Labs  Lab 12/27/17 0932  NA 135  K 3.7  CL 99  CO2 27  BUN 33*  CREATININE 1.18*  GLUCOSE 165*  Electrolytes Recent Labs  Lab 12/27/17 0932  CALCIUM 8.0*   Sepsis Markers Recent Labs  Lab 12/27/17 0932 12/27/17 1104  LATICACIDVEN 3.2* 3.1*   ABG No results for input(s): PHART, PCO2ART, PO2ART in the last 168 hours. Liver Enzymes No results for input(s): AST, ALT, ALKPHOS, BILITOT, ALBUMIN in the last 168 hours. Cardiac Enzymes Recent Labs  Lab 12/27/17 0932  TROPONINI <0.03   Glucose No results for input(s): GLUCAP in the last 168 hours.   No results found for this or any previous visit (from the past 240 hour(s)).   Current Facility-Administered Medications:  .  acetaminophen (TYLENOL) tablet 650 mg, 650 mg, Oral, Q6H PRN, Sakai, Isami, DO .  diltiazem (CARDIZEM) 100 mg in dextrose 5 % 100 mL (1 mg/mL) infusion, 5-15 mg/hr, Intravenous, Titrated, Sakai, Isami, DO .  [START ON 12/28/2017] enoxaparin (LOVENOX) injection 40 mg, 40 mg, Subcutaneous, Q24H, Sakai, Isami, DO .  famotidine (PEPCID) IVPB 20 mg premix, 20 mg, Intravenous, Q24H, Sakai, Isami, DO .   lactated ringers infusion, , Intravenous, Continuous, Sakai, Isami, DO .  levofloxacin (LEVAQUIN) IVPB 750 mg, 750 mg, Intravenous, Once, Sakai, Isami, DO, Last Rate: 100 mL/hr at 12/27/17 1205, 750 mg at 12/27/17 1205 .  levofloxacin (LEVAQUIN) IVPB 750 mg, 750 mg, Intravenous, Q24H, Sakai, Isami, DO .  metroNIDAZOLE (FLAGYL) IVPB 500 mg, 500 mg, Intravenous, Q8H, Sakai, Isami, DO .  morphine 2 MG/ML injection 2 mg, 2 mg, Intravenous, Q3H PRN, Sakai, Isami, DO .  ondansetron (ZOFRAN) injection 4 mg, 4 mg, Intravenous, Q6H PRN, Sakai, Isami, DO .  vancomycin (VANCOCIN) IVPB 1000 mg/200 mL premix, 1,000 mg, Intravenous, Once, Sakai, Isami, DO  IMAGING    Ct Abdomen Pelvis W Contrast  Result Date: 12/27/2017 CLINICAL DATA:  77 year old female with abdominal pain and emesis for 2 days. 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 Initial encounter. EXAM: CT ABDOMEN AND PELVIS WITH CONTRAST TECHNIQUE: Multidetector CT imaging of the abdomen and pelvis was performed using the standard protocol following bolus administration of intravenous contrast. CONTRAST:  <See Chart> ISOVUE-300 IOPAMIDOL (ISOVUE-300) INJECTION 61% COMPARISON:  03/30/2017 chest CT. 12/27/2017 chest x-ray. 12/17/2011 CT abdomen and pelvis. FINDINGS: Lower chest: Stable right lower lobe 5.5 mm nodule (series 4, image 1). Stable right lower lobe 4.8 mm nodule (series 4, image 10). Stable left lower lobe 4.9 mm nodule (series 4, image 2). Stable 2.6 mm nodule left lung base (series 4, image 8). Basilar atelectasis/scarring. Cardiomegaly. Coronary artery calcifications. Moderate size hiatal hernia post prior surgery. Hepatobiliary: Top-normal size liver. Stable 1.2 mm low-density structure possibly a cyst dome of the liver. Post cholecystectomy. Perihepatic fluid. Pancreas: No worrisome pancreatic mass or primary pancreatic inflammation. Spleen: Stable large calcified low-density lesion within  the spleen. Adrenals/Urinary Tract: Nodularity left adrenal gland similar to prior chest CT. No right adrenal lesion. No obstructing stone or hydronephrosis. Left parapelvic cyst. No worrisome renal lesion. Noncontrast filled imaging urinary bladder unremarkable. Stomach/Bowel: Diffuse inflammatory process extends between the descending colon/sigmoid colon and distal jejunal loops. Significant number of diverticula seen throughout the colon and it is possible findings are related to perforated diverticulitis however, distal jejunum appears markedly abnormal with thickened hyperemic walls and possibility of areas of wall interruption (series 5, image 22 and series 5, image 26). Perforated bowel causes gas and fluid collection insinuating between jejunal loops (largest collection measures 8.8 x 2.1 x 2.9 cm with smaller collection measuring 3 x 2.5 x 3.2 cm).  Gas and fluid collection along the right lateral aspect of the dome of the bladder spans over 6.2 x 4.2 x 3 cm. Fluid and tiny amount of gas left paracolic gutter. Fluid extends to surround the liver and spleen. Fluid right femoral/inguinal region could represent dissection of inflammatory process into this region spanning over 4.2 x 5 x 2.8 cm. Moderate-size hiatal hernia post attempted repair. Vascular/Lymphatic: Atherosclerotic changes aorta and aortic branch vessels. No abdominal aortic aneurysm or large vessel occlusion. Scattered lymph nodes possibly reactive in origin. Reproductive: No primary uterine or adnexal abnormality noted although immediately adjacent to inflammatory process. Other: As above. Musculoskeletal: No acute osseous abnormality. IMPRESSION: 1. Diffuse inflammatory process extends between the descending colon/sigmoid colon and distal jejunal loops. Significant number of diverticula seen throughout the colon and it is possible findings are related to perforated diverticulitis however, distal jejunum appear markedly abnormal with thickened  hyperemic walls, possibility with areas of wall interruption/perforation (series 5, image 22 and series 5, image 26). 2. Perforated bowel causes gas and fluid collection insinuating between jejunal loops (largest collection measures 8.8 x 2.1 x 2.9 cm with smaller collection measuring 3 x 2.5 x 3.2 cm). Gas and fluid collection along the right lateral aspect of the dome of the bladder spans over 6.2 x 4.2 x 3 cm. Fluid and tiny amount of gas left paracolic gutter. Fluid extends to surround the liver and spleen. Fluid right femoral/inguinal region could represent dissection of inflammatory process into this region spanning over 4.2 x 5 x 2.8 cm. 3. Moderate size hiatal hernia. 4. Stable bilateral pulmonary nodules, large low-density/cystic calcified splenic lesion, dome right lobe liver lesion and nodularity left adrenal gland as detailed above. 5. Cardiomegaly.  Coronary artery calcifications. 6.  Aortic Atherosclerosis (ICD10-I70.0). These results were called by telephone at the time of interpretation on 12/27/2017 at 10:59 am to Dr. Nance Pear , who verbally acknowledged these results. Electronically Signed   By: Genia Del M.D.   On: 12/27/2017 11:33   Dg Chest Port 1 View  Result Date: 12/27/2017 CLINICAL DATA:  Pain and atrial fibrillation EXAM: PORTABLE CHEST 1 VIEW COMPARISON:  Chest radiograph December 10, 2014 and chest CT March 30, 2017 FINDINGS: There is scarring in the left lower lobe region. There is patchy airspace opacity in the right upper lobe at the level of the superior right hilum. Lungs elsewhere clear. There is cardiomegaly with pulmonary vascularity within normal limits. No adenopathy. There is aortic atherosclerosis. No evident bone lesions. IMPRESSION: Patchy opacity right upper lobe near the right hilum. Question subtle pneumonia versus scarring. There is scarring in the left lower lobe. Lungs elsewhere clear. Stable cardiac silhouette.  There is aortic atherosclerosis. Aortic  Atherosclerosis (ICD10-I70.0). Electronically Signed   By: Lowella Grip III M.D.   On: 12/27/2017 09:27   INDWELLING DEVICES: Foley Catheter placed 7/23  MICRO DATA: MRSA PCR: pending Urine: no evidence of infection Blood: drawn 7/23  ANTIMICROBIALS: levaquin and flagyll    ASSESSMENT/PLAN   77 year old female with a fib, asthma, and history of lung cancer who presented to the emergency department for evaluation of abdominal pain. On exam patient has signs of acute abdomen with rebound tenderness. Patient also has tachypnea and is in a fib.   PULMONARY -tachypnea likely secondary to abdominal distention and pain. Lungs are clear on exam. -ABG to assess for acidosis   CARDIOVASCULAR A: Atrial Fibrillation P: Amiodarone started for rate and rhythm control. D/c diltiazem for blood pressure control.  -  hold Eliquis for possible surgery  RENAL A:  Acute kidney injury with Lactic Acidosis P:  Creatinine elevated 0.73--> 1.18, continue IVF -foley catheter placed for urinary retention -will trend lactate: 3.2--> 3.1  GASTROINTESTINAL A:  Perforated bowel vs inflammation secondary to possible diverticulitis P:  Dr. Lysle Pearl from surgery consulted and spoke with Dr. Mortimer Fries. Patient is high risk for surgical intervention at this point due to her A fib and eliquis. Plan is to hold off on surgery for now and serial abdominal exams to assess progression of acute abdomen. Further management per surgery. PRN morphine for abdominal pain PRN zofran for nausea   INFECTIOUS A:  Perforated bowel vs. Inflammation vs. diverticulitis P:  Abx: Levaquin and Flagyll: start date 7/23 -WBC count 21.6k secondary to abdominal infection BCx2: drawn 7/23 UC: pending  NEUROLOGIC A:  Acute encephalopathy P:  Likely secondary to abdominal infection. ABG pending to assess for acidosis  ENDOCRINE -no acute concerns  HEMATOLOGIC -hold blood thinners for possible surgery   I have personally  obtained a history, examined the patient, evaluated laboratory and independently reviewed  imaging results, formulated the assessment and plan and placed orders.  The Patient requires high complexity decision making for assessment and support, frequent evaluation and titration of therapies, application of advanced monitoring technologies and extensive interpretation of multiple databases. Critical Care Time devoted to patient care services described in this note is 50 minutes.   Overall, patient is critically ill, prognosis is guarded. Patient at high risk for cardiac arrest and death.

## 2017-12-27 NOTE — Progress Notes (Signed)
Interval progress:  Started on dig per cards to see if better rate control.  Pt more alert than previous exams, states no change in pain level.  Abd exam remains stable with focal tenderness in RLQ/infraumbilical region.  No rebound or guarding noted in other quadrants.  Soft, non-distended.  Will continue close monitoring

## 2017-12-27 NOTE — ED Provider Notes (Signed)
[facility] Emergency Department Provider Note   ____________________________________________   I have reviewed the triage vital signs and the nursing notes.   HISTORY  Chief Complaint Abdominal Pain; Emesis; and Atrial Fibrillation   History limited by: Dementia   HPI Jody [name] is a 77 y.o. female who presents to the emergency department today because of concerns for abdominal pain, nausea and emesis.  Patient states that symptoms have been present for the past few days.  She describes abdominal pain as being located in the lower part of her abdomen.  It is worse and severe when she moves.  This has been accompanied by nausea and vomiting.  She denies any bloody emesis.  She denies any fevers.  Family states she was not able to keep her medications down the past couple of days secondary to the nausea and emesis.  Patient has known history of atrial fibrillation.  EMS found the patient be in A. fib with RVR and gave 20 mg of diltiazem without any significant change in heart rate.   Per medical record review patient has a history of atrial fibrillation  Past Medical History:  Diagnosis Date  . Asthma   . Atrial fibrillation ([place])   . Lung cancer ([place])    10 yrs ago    Patient Active Problem List   Diagnosis Date Noted  . Malignant neoplasm of upper lobe of right lung ([place]) 12/31/2015  . 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 TI (tricuspid incompetence) 01/21/2015  . Anxiety 01/13/2015  . Clinical depression 01/13/2015  . 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 Borderline diabetes mellitus 01/13/2015  . Benign essential HTN 01/06/2015  . Essential (primary) hypertension 01/06/2015  . Atrial fibrillation, chronic ([place]) 05/20/2014  . 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 Chronic atrial fibrillation ([place]) 05/20/2014  . 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 Cough 11/20/2013  . Breath shortness 11/01/2013  . Breathlessness on  exertion 10/30/2013    No past surgical history on file.  Prior to Admission medications   Medication Sig Start Date End Date Taking? Authorizing Provider  alendronate (FOSAMAX) 70 MG tablet Take by mouth. 12/29/16 12/29/17  [provider]  ALPRAZolam Duanne Moron) 0.25 MG tablet Take 0.25 mg by mouth at bedtime as needed.  10/07/14   [provider]  apixaban (ELIQUIS) 5 MG TABS tablet Take 5 mg by mouth daily.  01/06/15   [provider]  Cholecalciferol (VITAMIN D PO) Take by mouth.    [provider]  diltiazem (CARDIZEM CD) 120 MG 24 hr capsule Take 120 mg by mouth daily.  09/09/14   [provider]  FLUoxetine (PROZAC) 20 MG capsule Take 20 mg by mouth daily.  02/20/14   [provider]  furosemide (LASIX) 20 MG tablet Take by mouth. 11/25/14 04/01/17  [provider]  furosemide (LASIX) 20 MG tablet Take by mouth. 12/15/16 12/15/17  [provider]  lovastatin (MEVACOR) 20 MG tablet Take 20 mg by mouth daily at 6 PM.  01/06/15   [provider]  metoprolol succinate (TOPROL XL) 50 MG 24 hr tablet Take 1 tablet (50 mg total) by mouth daily. Take with or immediately following a meal. 02/10/15 04/01/17  Daymon Larsen, MD  montelukast (SINGULAIR) 10 MG tablet Take 10 mg by mouth at bedtime.  08/23/14   [provider]  Multiple Vitamin (MULTI-VITAMINS) TABS Take by mouth.    [provider]  omeprazole (PRILOSEC) 20 MG capsule Take 20 mg by  mouth daily.  08/15/14   [provider]  potassium chloride (K-DUR) 10 MEQ tablet Take by mouth. 11/25/14 04/01/17  [provider]  metoprolol tartrate (LOPRESSOR) 25 MG tablet Take by mouth. 07/03/14 02/10/15  [provider]    Allergies Poison oak extract; Ampicillin; and Penicillin v potassium  Family History  Problem Relation Age of Onset  . Breast cancer Paternal Aunt     Social History Social History   Tobacco Use  . Smoking status:  Never Smoker  . Smokeless tobacco: Never Used  Substance Use Topics  . Alcohol use: No  . Drug use: Not on file    Review of Systems Constitutional: No fever/chills Eyes: No visual changes. ENT: No sore throat. Cardiovascular: Denies chest pain. Respiratory: Denies shortness of breath. Gastrointestinal: Positive for abdominal pain and emesis. Genitourinary: Negative for dysuria. Musculoskeletal: Negative for back pain. Skin: Negative for rash. Neurological: Negative for headaches, focal weakness or numbness.  ____________________________________________   PHYSICAL EXAM:  VITAL SIGNS: ED Triage Vitals  Enc Vitals Group     BP 122/84     Pulse 164     Resp 25     Temp 98.2     Temp src      SpO2 96   Constitutional: Awake and alert.  Eyes: Conjunctivae are normal.  ENT      Head: Normocephalic and atraumatic.      Nose: No congestion/rhinnorhea.      Mouth/Throat: Mucous membranes are moist.      Neck: No stridor. Hematological/Lymphatic/Immunilogical: No cervical lymphadenopathy. Cardiovascular: Tachycardic, irreguarly irregular.  No murmurs, rubs, or gallops appreciated.  Respiratory: Normal respiratory effort without tachypnea nor retractions. Breath sounds are clear and equal bilaterally. No wheezes/rales/rhonchi. Gastrointestinal: Soft and slightly distended. Tympanitic. Diffusely tender to palpation. No rebound. No guarding.  Genitourinary: Deferred Musculoskeletal: Normal range of motion in all extremities. No lower extremity edema. Neurologic:  Demented. Moving all extremities.  Skin:  Skin is warm, dry and intact. No rash noted.   ____________________________________________    LABS (pertinent positives/negatives)  CBC wbc 21.6, hgb 13.2, plt 331 Lactic acid 3.2 Trop <0.03  ____________________________________________   EKG  I, Nance Pear, attending physician, personally viewed and interpreted this EKG  EKG Time: 0843 Rate: 159 Rhythm:  atrial fibrillation Axis: normal Intervals: qtc 448 QRS: narrow ST changes: no st elevation Impression: abnormal ekg  ____________________________________________    RADIOLOGY  CXR No acute disease  CT abd/pel Concern for perforation, early abscess formation.   I, Nance Pear, personally discussed these images (CT scan) and results by phone with the on-call radiologist and used this discussion as part of my medical decision making.   ____________________________________________   PROCEDURES  Procedures  CRITICAL CARE Performed by: Nance Pear   Total critical care time: 45 minutes  Critical care time was exclusive of separately billable procedures and treating other patients.  Critical care was necessary to treat or prevent imminent or life-threatening deterioration.  Critical care was time spent personally by me on the following activities: development of treatment plan with patient and/or surrogate as well as nursing, discussions with consultants, evaluation of patient's response to treatment, examination of patient, obtaining history from patient or surrogate, ordering and performing treatments and interventions, ordering and review of laboratory studies, ordering and review of radiographic studies, pulse oximetry and re-evaluation of patient's condition.  ____________________________________________   INITIAL IMPRESSION / ASSESSMENT AND PLAN / ED COURSE  Pertinent labs & imaging results that were available during my  care of the patient were reviewed by me and considered in my medical decision making (see chart for details).   Patient presented to the emergency department today because of for abdominal pain, nausea and vomiting.  Per EMS patient was found to be in A. fib with RVR.  Patient's heart rate was significantly elevated upon presentation to the emergency department.  Exam she was also noted to have some abdominal tenderness.  In terms of the A. fib  with RVR patient was given further doses of his diltiazem without any significant control.  She was placed on a diltiazem drip.  The abdominal tenderness was investigated with a CT scan.  This was concerning for perforation.  At this point is concerned that patient is likely septic secondary to perforated bowel.  Because of this surgery was consulted.  Patient was placed on antibiotics.  Patient will be admitted by the surgical service.  ____________________________________________   FINAL CLINICAL IMPRESSION(S) / ED DIAGNOSES  Final diagnoses:  Atrial fibrillation with RVR (HCC)  Abdominal pain, unspecified abdominal location  Perforation bowel ([place])  Lactic acidosis     Note: This dictation was prepared with Dragon dictation. Any transcriptional errors that result from this process are unintentional     Nance Pear, MD 12/28/17 1525

## 2017-12-27 NOTE — Progress Notes (Signed)
Interim progress note:  Abdominal exam essentially unchanged since H&P earlier this am.  Soft with negative rebound tenderness except at RLQ and infraumbilical region, where she is focally tender. Will continue to monitor with serial abdominal exam with low threshold to surgical intervention if absolutely needed.

## 2017-12-27 NOTE — ED Notes (Signed)
Dr Archie Balboa notified of elevated lactic acid of 3.2 - see new orders

## 2017-12-27 NOTE — Progress Notes (Signed)
I have spoken to husband at bedside I have expressed that patient is very sick and critically ill with multiorgan failure.  She is at very high risk for death and dying, she is suffering and struggling to breathe.   He has consented and confirmed that she has a living will and she is a DNR/DNI.   Will place DNR/DNI orders and obtain palliative care consultation.   Corrin Parker, M.D.  [facility]  Medical Director [place] Director [facility] Cardio-Pulmonary Department

## 2017-12-27 NOTE — ED Triage Notes (Signed)
She arrives today from home via [facility] with reports of abdominal pain with emesis x 2 days  7/10 lower abdominal pain reported by pt  Per EMS pt found to be in Afib with RVR upon arrival to her home  Pt administered 20mg  diltiazem pta here   Pt is in Afib upon arrival here  164bpm

## 2017-12-27 NOTE — Consult Note (Signed)
Name: Jody [name] MRN: [id number] DOB: [birth year]     CONSULTATION DATE: 12/27/2017  REFERRING MD :  Crecencio Mc  CHIEF COMPLAINT:  abd pain    HISTORY OF PRESENT ILLNESS:  77 yo presented with abdominal pain began Sunday evening and has progressively gotten worse which is why she came to the hospital. She is also c/o nausea, vomiting "phlegm", and constipation (last BM 3 days ago). She has associated SOB which she states she has occasionally, but that it is worse than usual. She is unable to speak in complete sentences.  Patient noted to be in atrial fibrillation upon arrival to the ED. She has a known history of afib and is followed by Dr. Nehemiah Massed.  She is in resp distress Severe abd discomfort and pain Very nautious  Patient HR in 150's afib with RVR On cardizem infusion  Patient has LA 3.1 With acute renal failure    PAST MEDICAL HISTORY :   has a past medical history of Asthma, Atrial fibrillation ([place]), and Lung cancer ([place]).  has no past surgical history on file. Prior to Admission medications   Medication Sig Start Date End Date Taking? Authorizing Provider  alendronate (FOSAMAX) 70 MG tablet Take 70 mg by mouth every Monday.    Yes [provider]  ALPRAZolam (XANAX) 0.25 MG tablet Take 0.25 mg by mouth at bedtime as needed for anxiety or sleep.    Yes [provider]  apixaban (ELIQUIS) 5 MG TABS tablet Take 5 mg by mouth every 12 (twelve) hours.    Yes [provider]  Calcium Carbonate-Vitamin D (CALCIUM 600+D) 600-200 MG-UNIT TABS Take 1 tablet by mouth daily.   Yes [provider]  diltiazem (DILACOR XR) 240 MG 24 hr capsule Take 240 mg by mouth daily.   Yes [provider]  FLUoxetine (PROZAC) 20 MG capsule Take 20 mg by mouth daily.  02/20/14  Yes [provider]  furosemide (LASIX) 20 MG tablet Take 20 mg by mouth daily.   Yes [provider]  lovastatin (MEVACOR) 20 MG tablet Take 20 mg by mouth  daily with supper.    Yes [provider]  metoprolol succinate (TOPROL XL) 50 MG 24 hr tablet Take 1 tablet (50 mg total) by mouth daily. Take with or immediately following a meal. Patient taking differently: Take 50 mg by mouth 2 (two) times daily.  02/10/15 12/27/17 Yes Daymon Larsen, MD  montelukast (SINGULAIR) 10 MG tablet Take 10 mg by mouth at bedtime.  08/23/14  Yes [provider]  Multiple Vitamin (MULTI-VITAMINS) TABS Take 1 tablet by mouth daily.    Yes [provider]  omeprazole (PRILOSEC) 20 MG capsule Take 20 mg by mouth daily.  08/15/14  Yes [provider]  potassium chloride (K-DUR,KLOR-CON) 10 MEQ tablet Take 10 mEq by mouth daily.   Yes [provider]  metoprolol tartrate (LOPRESSOR) 25 MG tablet Take by mouth. 07/03/14 02/10/15  [provider]   Allergies  Allergen Reactions  . Poison Oak Extract Other (See Comments)    blistering  . Ampicillin Rash  . Penicillin V Potassium Rash    Has patient had a PCN reaction causing immediate rash, facial/tongue/throat swelling, SOB or lightheadedness with hypotension: No Has patient had a PCN reaction causing severe rash involving mucus membranes or skin necrosis: No Has patient had a PCN reaction that required hospitalization: No Has patient had a PCN reaction occurring within the last 10 years: No If all of  the above answers are "NO", then may proceed with Cephalosporin use.     FAMILY HISTORY:  family history includes Breast cancer in her paternal aunt. SOCIAL HISTORY:  reports that she has never smoked. She has never used smokeless tobacco. She reports that she does not drink alcohol.  REVIEW OF SYSTEMS:   Unable to obtain due to critical illness   VITAL SIGNS: Temp:  [97.1 F (36.2 C)-98.2 F (36.8 C)] 97.1 F (36.2 C) (07/23 1251) Pulse Rate:  [34-177] 130 (07/23 1500) Resp:  [20-44] 23 (07/23 1500) BP: (80-138)/(59-87) 125/70 (07/23 1500) SpO2:  [88 %-98 %]  88 % (07/23 1500) Weight:  [160 lb (72.6 kg)] 160 lb (72.6 kg) (07/23 0853)  Physical Examination:  GENERAL:critically ill appearing, +resp distress HEAD: Normocephalic, atraumatic.  EYES: Pupils equal, round, reactive to light.  No scleral icterus.  MOUTH: Moist mucosal membrane. NECK: Supple. No JVD.  PULMONARY: +rhonchi, +wheezing CARDIOVASCULAR: S1 and S2. Regular rate and rhythm. No murmurs, rubs, or gallops.  GASTROINTESTINAL: +rebound +tenderness diffuse, no bowel sounds MUSCULOSKELETAL: No swelling, clubbing, or edema.  NEUROLOGIC:lethargic SKIN:intact,warm,dry  CT abd- images reviewed. Areas of focal densities, fluid collections   ASSESSMENT / PLAN:  77 year old female with a fib, asthma, and history of lung cancer who presented to the emergency department for evaluation of abdominal pain. On exam patient has signs of acute abdomen with rebound tenderness. Patient also has tachypnea and is in a fib.   PULMONARY -tachypnea likely secondary to abdominal distention and pain. Lungs are clear on exam. -ABG to assess for acidosis   CARDIOVASCULAR A: Atrial Fibrillation P: Amiodarone started for rate and rhythm control. D/c diltiazem for blood pressure control.  -hold Eliquis for possible surgery  RENAL A:  Acute kidney injury with Lactic Acidosis P:  Creatinine elevated 0.73--> 1.18, continue IVF -foley catheter placed for urinary retention -will trend lactate: 3.2--> 3.1  GASTROINTESTINAL A:  Perforated bowel vs inflammation secondary to possible diverticulitis P:  Dr. Lysle Pearl from surgery consulted Patient is high risk for surgical intervention at this point due to her A fib and eliquis. Plan is to hold off on surgery for now and serial abdominal exams to assess progression of acute abdomen. Further management per surgery. PRN morphine for abdominal pain PRN zofran for nausea   INFECTIOUS A:  Perforated bowel vs. Inflammation vs. diverticulitis P:  Abx:  Levaquin and Flagyll: start date 7/23 -WBC count 21.6k secondary to abdominal infection BCx2: drawn 7/23 UC: pending  NEUROLOGIC A:  Acute encephalopathy P:  Likely secondary to abdominal infection. ABG pending to assess for acidosis  ENDOCRINE -no acute concerns  HEMATOLOGIC -hold blood thinners for possible surgery      Critical Care Time devoted to patient care services described in this note is 54 minutes.   Overall, patient is critically ill, prognosis is guarded.  Patient with Multiorgan failure and at high risk for cardiac arrest and death.    Recommend DNR status Palliative care consultation    Corrin Parker, M.D.  [facility]  Medical Director [place] Director [facility] Cardio-Pulmonary Department

## 2017-12-27 NOTE — Progress Notes (Signed)
Chaplain received OR for prayer and went to room and introduced herself as the on-call Chaplain. Patient was awake and husband Jody [name] was by her side. Chaplain said that she was here to pray with her and patient and husband said ok. Chaplain ask was there anything special she wanted her to pray for, patient said yes that she gets better. Chaplain prayed and offer emotional and spiritual support.

## 2017-12-27 NOTE — ED Notes (Signed)
Lab called to draw cultures - approved by charge nurse

## 2017-12-27 NOTE — Progress Notes (Signed)
PHARMACY NOTE:  ANTIMICROBIAL RENAL DOSAGE ADJUSTMENT  Current antimicrobial regimen includes a mismatch between antimicrobial dosage and estimated renal function.  As per policy approved by the Pharmacy & Therapeutics and Medical Executive Committees, the antimicrobial dosage will be adjusted accordingly.  Current antimicrobial dosage:  Levaquin 750 mg iv daily  Indication: IAI   Renal Function:  Estimated Creatinine Clearance: 33.6 mL/min (A) (by C-G formula based on SCr of 1.18 mg/dL (H)). []      On intermittent HD, scheduled: []      On CRRT    Antimicrobial dosage has been changed to:  Levaquin 750 mg iv q 48 hours.   Additional comments:   Thank you for allowing pharmacy to be a part of this patient's care.  Napoleon Form, [facility] 12/27/2017 1:43 PM

## 2017-12-27 NOTE — Progress Notes (Signed)
Dr. Mortimer Fries at bedside assessing patient and RN made MD aware of bladder scan results of 411 after voiding and that patient complains of having retention for several days.  MD gave order to insert foley for retention and to stop cardizem drip and to given amiodarone IV bolus and start amiodarone drip.

## 2017-12-28 DIAGNOSIS — I4891 Unspecified atrial fibrillation: Secondary | ICD-10-CM

## 2017-12-28 DIAGNOSIS — A419 Sepsis, unspecified organism: Principal | ICD-10-CM

## 2017-12-28 DIAGNOSIS — R6520 Severe sepsis without septic shock: Secondary | ICD-10-CM

## 2017-12-28 LAB — BASIC METABOLIC PANEL WITH GFR
Anion gap: 5 (ref 5–15)
BUN: 18 mg/dL (ref 8–23)
CO2: 25 mmol/L (ref 22–32)
Calcium: 7.4 mg/dL — ABNORMAL LOW (ref 8.9–10.3)
Chloride: 104 mmol/L (ref 98–111)
Creatinine, Ser: 0.6 mg/dL (ref 0.44–1.00)
GFR calc Af Amer: >60 mL/min
GFR calc non Af Amer: >60 mL/min
Glucose, Bld: 148 mg/dL — ABNORMAL HIGH (ref 70–99)
Potassium: 3.3 mmol/L — ABNORMAL LOW (ref 3.5–5.1)
Sodium: 134 mmol/L — ABNORMAL LOW (ref 135–145)

## 2017-12-28 LAB — CBC WITH DIFFERENTIAL/PLATELET
Basophils Absolute: 0 K/uL (ref 0–0.1)
Basophils Relative: 0 %
Eosinophils Absolute: 0 K/uL (ref 0–0.7)
Eosinophils Relative: 0 %
HCT: 31.6 % — ABNORMAL LOW (ref 35.0–47.0)
Hemoglobin: 10.8 g/dL — ABNORMAL LOW (ref 12.0–16.0)
Lymphocytes Relative: 1 %
Lymphs Abs: 0.2 K/uL — ABNORMAL LOW (ref 1.0–3.6)
MCH: 28 pg (ref 26.0–34.0)
MCHC: 34.2 g/dL (ref 32.0–36.0)
MCV: 81.7 fL (ref 80.0–100.0)
Monocytes Absolute: 0.7 K/uL (ref 0.2–0.9)
Monocytes Relative: 4 %
Neutro Abs: 15.1 K/uL — ABNORMAL HIGH (ref 1.4–6.5)
Neutrophils Relative %: 95 %
Platelets: 226 K/uL (ref 150–440)
RBC: 3.87 MIL/uL (ref 3.80–5.20)
RDW: 15.5 % — ABNORMAL HIGH (ref 11.5–14.5)
WBC: 16 K/uL — ABNORMAL HIGH (ref 3.6–11.0)

## 2017-12-28 LAB — PHOSPHORUS: Phosphorus: 1.7 mg/dL — ABNORMAL LOW (ref 2.5–4.6)

## 2017-12-28 LAB — GLUCOSE, CAPILLARY: Glucose-Capillary: 166 mg/dL — ABNORMAL HIGH (ref 70–99)

## 2017-12-28 LAB — LACTIC ACID, PLASMA: Lactic Acid, Venous: 0.9 mmol/L (ref 0.5–1.9)

## 2017-12-28 LAB — PROCALCITONIN: Procalcitonin: 3.49 ng/mL

## 2017-12-28 LAB — MAGNESIUM: Magnesium: 1.8 mg/dL (ref 1.7–2.4)

## 2017-12-28 LAB — LACTATE DEHYDROGENASE: LDH: 90 U/L — AB (ref 98–192)

## 2017-12-28 MED ORDER — SODIUM CHLORIDE 0.9 % IV BOLUS: 1000.0000 mL | Freq: Once | INTRAVENOUS | Status: DC

## 2017-12-28 MED ORDER — POTASSIUM CHLORIDE 10 MEQ/100ML IV SOLN
10.0000 meq | INTRAVENOUS | Status: AC
  Administered 2017-12-28 (×4): 10 meq via INTRAVENOUS
  Filled 2017-12-28 (×4): qty 100

## 2017-12-28 MED ORDER — METOPROLOL TARTRATE 5 MG/5ML IV SOLN
2.5000 mg | INTRAVENOUS | Status: DC | PRN
  Administered 2017-12-28: 5 mg via INTRAVENOUS
  Administered 2017-12-29 – 2017-12-31 (×2): 2.5 mg via INTRAVENOUS
  Administered 2018-01-03 – 2018-01-06 (×2): 5 mg via INTRAVENOUS
  Administered 2018-01-09: 2.5 mg via INTRAVENOUS
  Administered 2018-01-11 – 2018-01-13 (×3): 5 mg via INTRAVENOUS
  Filled 2017-12-28 (×9): qty 5

## 2017-12-28 MED ORDER — LEVOFLOXACIN IN D5W 750 MG/150ML IV SOLN
750.0000 mg | INTRAVENOUS | Status: DC
  Administered 2017-12-28 – 2017-12-30 (×3): 750 mg via INTRAVENOUS
  Filled 2017-12-28 (×4): qty 150

## 2017-12-28 MED ORDER — LORAZEPAM 2 MG/ML IJ SOLN
0.5000 mg | Freq: Once | INTRAMUSCULAR | Status: AC
  Administered 2017-12-28: 0.5 mg via INTRAVENOUS
  Filled 2017-12-28: qty 1

## 2017-12-28 MED ORDER — LEVALBUTEROL HCL 0.63 MG/3ML IN NEBU
0.6300 mg | INHALATION_SOLUTION | Freq: Four times a day (QID) | RESPIRATORY_TRACT | Status: DC | PRN
  Administered 2017-12-31 (×2): 0.63 mg via RESPIRATORY_TRACT
  Filled 2017-12-28 (×2): qty 3

## 2017-12-28 MED ORDER — METOPROLOL TARTRATE 5 MG/5ML IV SOLN
2.5000 mg | Freq: Four times a day (QID) | INTRAVENOUS | Status: DC
  Administered 2017-12-28 – 2017-12-29 (×4): 2.5 mg via INTRAVENOUS
  Filled 2017-12-28 (×4): qty 5

## 2017-12-28 MED ORDER — MORPHINE SULFATE (PF) 2 MG/ML IV SOLN
2.0000 mg | INTRAVENOUS | Status: DC | PRN
  Administered 2017-12-28 – 2018-01-12 (×27): 2 mg via INTRAVENOUS
  Filled 2017-12-28 (×29): qty 1

## 2017-12-28 MED ORDER — LORAZEPAM 2 MG/ML IJ SOLN
0.5000 mg | Freq: Every evening | INTRAMUSCULAR | Status: DC | PRN
  Administered 2017-12-28: 0.5 mg via INTRAVENOUS
  Filled 2017-12-28: qty 1

## 2017-12-28 MED ORDER — DILTIAZEM HCL-DEXTROSE 100-5 MG/100ML-% IV SOLN (PREMIX)
0.0000 mg/h | INTRAVENOUS | Status: DC
Start: 2017-12-28 — End: 2018-01-09
  Administered 2017-12-28: 5 mg/h via INTRAVENOUS
  Administered 2017-12-28: 10 mg/h via INTRAVENOUS
  Administered 2017-12-28: 5 mg/h via INTRAVENOUS
  Administered 2017-12-29 – 2017-12-30 (×4): 15 mg/h via INTRAVENOUS
  Administered 2017-12-30: 5 mg/h via INTRAVENOUS
  Administered 2017-12-30: 15 mg/h via INTRAVENOUS
  Administered 2018-01-01: 5 mg/h via INTRAVENOUS
  Administered 2018-01-01: 12 mg/h via INTRAVENOUS
  Administered 2018-01-02: 15 mg/h via INTRAVENOUS
  Administered 2018-01-02: 10 mg/h via INTRAVENOUS
  Administered 2018-01-02: 12 mg/h via INTRAVENOUS
  Administered 2018-01-03 – 2018-01-05 (×11): 15 mg/h via INTRAVENOUS
  Administered 2018-01-06: 5 mg/h via INTRAVENOUS
  Administered 2018-01-06: 15 mg/h via INTRAVENOUS
  Administered 2018-01-06: 10 mg/h via INTRAVENOUS
  Filled 2017-12-28 (×33): qty 100

## 2017-12-28 NOTE — Progress Notes (Signed)
Surgeon updated of care received throughout night during morning round.

## 2017-12-28 NOTE — Progress Notes (Signed)
Pt A&O to self and place in A-fib HR in 140's. Dr. Alva Garnet notified, no new orders at this time. Abdomen taught, hypoactive bowel sounds, pt denies tenderness at this time.

## 2017-12-28 NOTE — Progress Notes (Signed)
Pt HR still fluctuating, after Dig, Metoprolol, and Cardizem gtt started along with already running Amio gtt. Pt having some SOB and increased WOB, morphine given. Pt having episodes of anxiety and reported by husband, NP made aware, order for Ativan given without relief, another dose administered per order. Pt resting more comfortably at this time. Husband at beside throughout night and updated and aware of care pt receiving as it happened.

## 2017-12-28 NOTE — Progress Notes (Signed)
Somnolent No complaints of pain or dyspnea  Vitals:   12/28/17 1200 12/28/17 1300 12/28/17 1400 12/28/17 1500  BP: 134/87 123/88 137/86 (!) 149/93  Pulse: (!) 153 (!) 134 (!) 134 (!) 118  Resp: (!) 26 (!) 28 (!) 29 (!) 34  Temp: 98.7 F (37.1 C)     TempSrc: Oral     SpO2: 95% 95% 94% 92%  Weight:      Height:       Mildly labored respiratory pattern Otherwise, no overt distress HEENT WNL JVP not visualized No wheezes or other adventitious sounds IR IR, tachy, no M noted Abdomen nondistended, mildly tender, diminished BS Extremities warm without edema No focal deficits noted  BMP Latest Ref Rng & Units 12/28/2017 12/27/2017 04/01/2017  Glucose 70 - 99 mg/dL 148(H) 165(H) 112(H)  BUN 8 - 23 mg/dL 18 33(H) 17  Creatinine 0.44 - 1.00 mg/dL 0.60 1.18(H) 0.73  Sodium 135 - 145 mmol/L 134(L) 135 131(L)  Potassium 3.5 - 5.1 mmol/L 3.3(L) 3.7 4.3  Chloride 98 - 111 mmol/L 104 99 93(L)  CO2 22 - 32 mmol/L 25 27 29   Calcium 8.9 - 10.3 mg/dL 7.4(L) 8.0(L) 9.6   CBC Latest Ref Rng & Units 12/28/2017 12/27/2017 04/01/2017  WBC 3.6 - 11.0 K/uL 16.0(H) 21.6(H) 9.5  Hemoglobin 12.0 - 16.0 g/dL 10.8(L) 13.2 12.9  Hematocrit 35.0 - 47.0 % 31.6(L) 38.9 38.0  Platelets 150 - 440 K/uL 226 331 316   CXR: No new film  IMPRESSION: Diverticulitis with contained bowel perforation Abdominal sepsis Chronic atrial fibrillation on chronic beta-blocker therapy, now with RVR AKI, resolved Hypokalemia Acute encephalopathy  PLAN/REC: General surgery following Continue levofloxacin/metronidazole Continue medical management of atrial fibrillation with amiodarone, diltiazem infusions Begin scheduled IV metoprolol IV metoprolol as needed to maintain HR <115/min DNR status appropriate Palliative care consultation has been requested  Merton Border, MD PCCM service Mobile [phone] Pager [phone] 12/28/2017 4:11 PM

## 2017-12-28 NOTE — Progress Notes (Addendum)
Subjective:    Jody [name] is a 77 y.o. female  Hospital stay day 1,   diverticulitis, afib with rvr  No acute changes.  afib rvr continues. Pt states no worsening of pain  ROS:  A 5 point review of systems was performed and pertinent positives and negatives noted in HPI.   Objective:      Temp:  [97.7 F (36.5 C)-98.7 F (37.1 C)] 98.7 F (37.1 C) (07/24 1600) Pulse Rate:  [118-153] 130 (07/24 1900) Resp:  [23-40] 27 (07/24 1900) BP: (107-149)/(72-97) 117/81 (07/24 1900) SpO2:  [92 %-97 %] 94 % (07/24 1900)     Height: 4\' 11"  (149.9 cm) Weight: 72.6 kg (160 lb) BMI (Calculated): 32.3   Intake/Output this shift:  No intake/output data recorded.   Intake/Output last 2 shifts:     Constitutional :  alert, cooperative and appears stated age  Respiratory:  clear to auscultation bilaterally  Cardiovascular:  irregularly irregular rhythm  Gastrointestinal: slight improvement in pain level, with less tenderness to more superficial palpation of RLQ, suprapubic region.  remaining qudrants remain sofft, no rebound tenderness..   Skin: Cool and moist.   Psychiatric: Normal affect, non-agitated, able to answer questions appropriately on my exam       LABS:  CMP Latest Ref Rng & Units 12/28/2017 12/27/2017 04/01/2017  Glucose 70 - 99 mg/dL 148(H) 165(H) 112(H)  BUN 8 - 23 mg/dL 18 33(H) 17  Creatinine 0.44 - 1.00 mg/dL 0.60 1.18(H) 0.73  Sodium 135 - 145 mmol/L 134(L) 135 131(L)  Potassium 3.5 - 5.1 mmol/L 3.3(L) 3.7 4.3  Chloride 98 - 111 mmol/L 104 99 93(L)  CO2 22 - 32 mmol/L 25 27 29   Calcium 8.9 - 10.3 mg/dL 7.4(L) 8.0(L) 9.6  Total Protein 6.5 - 8.1 g/dL - - -  Total Bilirubin 0.3 - 1.2 mg/dL - - -  Alkaline Phos 38 - 126 U/L - - -  AST 15 - 41 U/L - - -  ALT 14 - 54 U/L - - -   CBC Latest Ref Rng & Units 12/28/2017 12/27/2017 04/01/2017  WBC 3.6 - 11.0 K/uL 16.0(H) 21.6(H) 9.5  Hemoglobin 12.0 - 16.0 g/dL 10.8(L) 13.2 12.9  Hematocrit 35.0 - 47.0 % 31.6(L) 38.9 38.0   Platelets 150 - 440 K/uL 226 331 316    RADS: none Assessment:        Diverticulitis, perforated.  Improving on abx, with decreasing lactate, wbc, and clinically improving exam.  Continue present management  afib rvr per critical care team. Plan:

## 2017-12-28 NOTE — Progress Notes (Signed)
PHARMACY NOTE:  ANTIMICROBIAL RENAL DOSAGE ADJUSTMENT  Current antimicrobial regimen includes a mismatch between antimicrobial dosage and estimated renal function.  As per policy approved by the Pharmacy & Therapeutics and Medical Executive Committees, the antimicrobial dosage will be adjusted accordingly.  Current antimicrobial dosage:  Levaquin 750 mg iv q 48 hours  Indication: IAI   Renal Function:  Estimated Creatinine Clearance: 49.5 mL/min (by C-G formula based on SCr of 0.6 mg/dL). []      On intermittent HD, scheduled: []      On CRRT    Antimicrobial dosage has been changed to:  Levaquin 750 mg iv q 24 hours in the setting of improved SCr and significantly elevated PCT.  Additional comments:   Thank you for allowing pharmacy to be a part of this patient's care.  Napoleon Form, [facility] 12/28/2017 8:21 AM

## 2017-12-28 NOTE — Progress Notes (Addendum)
A&O to self and place. Abdomen is taught and pt reports no tenderness on assessments. Using the FLACC pain scale, Pt is confused and does not respond appropriately to 0-10 pain scale. Pt is is A-fib with frequent PVC's on amiodarone drip 60mg  and cardizem drip 10 mg along with Metoprolol 2.5mg  scheduled Q 6. Dr, Alva Garnet has been notified and updated. No new orders at this time.

## 2017-12-28 NOTE — Progress Notes (Signed)
CRITICAL CARE NOTE  CC  follow up perforated bowel and atrial fibrillation  SUBJECTIVE Patient remains critically ill Prognosis is guarded  78 year old female who was admitted yesterday after presenting to the ED with abdominal pain. CT scan in the ED showed diffuse inflammation from the distal jejunum to the descending/sigmoid colon with perforated bowel possibly secondary to diverticulitis.   Patient states that she is "sleepy" this morning and that her stomach feels a little better.    SIGNIFICANT EVENTS Unable to control HR, with amiodarone and diltiazem drip. Cardiology consult called and Dr. Clayborn Bigness started patient on digoxin. Patient still tachycardic with tachypnea and increased work of breathing. Patient on 2L of o2 nasal cannula. Morphine and ativan given last night. Blood pressure holding.    BP 113/78   Pulse (!) 124   Temp 97.7 F (36.5 C) (Oral)   Resp (!) 28   Ht 4\' 11"  (1.499 m)   Wt 72.6 kg (160 lb)   SpO2 97%   BMI 32.32 kg/m    REVIEW OF SYSTEMS  PATIENT IS UNABLE TO PROVIDE COMPLETE REVIEW OF SYSTEM S DUE TO SEVERE CRITICAL ILLNESS AND ENCEPHALOPATHY   PHYSICAL EXAMINATION:  GENERAL:critically ill appearing, +resp distress, drowsy HEAD: Normocephalic, atraumatic.  EYES: Pupils equal, round, reactive to light.  No scleral icterus.  MOUTH: Moist mucosal membrane. NECK: Supple. No thyromegaly. No nodules. No JVD.  PULMONARY: +rhonchi, + expiratory wheezing. Increased work of breathing noted. CARDIOVASCULAR: S1 and S2. Irregular rate and rhythm. No murmurs, rubs, or gallops. 2+ dorsalis pedis pulses bilaterally GASTROINTESTINAL: Tender to palpation especially in the RLQ. Distended abdomen with guarding. No rebound tenderness. No hepatosplenomegaly.  MUSCULOSKELETAL: No swelling, clubbing, or edema.  NEUROLOGIC: confusion and disoriented, unable to correctly state her name SKIN:intact,warm,dry  ASSESSMENT AND PLAN SYNOPSIS  77 year old female with a  fib and perforated bowel possibly secondary to diverticulitis. Unable to control heart rate. Currently on amiodarone and diltiazem drip, blood pressure holding. Patient appears drowsy this morning, but is able to answer my questions. She is closely followed by general surgery.  Patient is status DO NOT RESUSCITATE/DO NOT INTUBATE. These orders have been placed in the chart.  GASTROINTESTINAL -Perforated bowel secondary to possible diverticulitis -Dr. [facility] is following closely. Patient high risk for surgery secondary to uncontrolled afib with RVR and elqiuis use. Further management per surgery -Prn morphine for abdominal pain and zofran for nausea  PULMONARY: -respiratory distress secondary to abdominal pain -ABG 7/23 showed no acidosis (pH 7.43, PCO2 31)  CARDIAC -Atrial Fibrillation with RVR -On amiodarone and diltiazem drip without control. Cardiology is following patient, appreciate their input. -PRN metoprolol with goal rate of <115 -Hold eliquis for possible surgery   RENAL: -AKI with Lactic Acidosis -Creatinine trending down with IVF: 1.18-->0.60 -Lactic Acid also trending down: 3.1-->0.9 -Potassium 3.3, Phosphate 1.7, and Magnesium 1.8: will replace   NEUROLOGY -Acute encephalopathy secondary to abdominal infection -Patient became anxious overnight, PRN ativan given x2 with relief. Patient's husband and nurse state she is becoming progressively disoriented   ID -Levaquin and Flagyll per surgery for perforated bowel -WBC trending down: 21.6-->16 -Blood cultures drawn: 7/23   DVT: SCDs, hold lovenox GI: famotidine  TRANSFUSIONS AS NEEDED MONITOR FSBS ASSESS the need for LABS as needed   Critical Care Time devoted to patient care services described in this note is 31 minutes.   Overall, patient is critically ill, prognosis is guarded.  Patient with Multiorgan failure and at high risk for  cardiac arrest and death.

## 2017-12-28 NOTE — Progress Notes (Signed)
   12/28/17 1120  Clinical Encounter Type  Visited With Patient and family together  Visit Type Follow-up   Chaplain checked in with patient family regarding ongoing support.  Family declined at present.  Chaplain encouraged patient/family to have chaplain paged if needs changed.

## 2017-12-29 DIAGNOSIS — I482 Chronic atrial fibrillation: Secondary | ICD-10-CM

## 2017-12-29 LAB — MAGNESIUM: MAGNESIUM: 1.9 mg/dL (ref 1.7–2.4)

## 2017-12-29 LAB — CBC WITH DIFFERENTIAL/PLATELET
BASOS PCT: 0 %
Basophils Absolute: 0 10*3/uL (ref 0–0.1)
EOS PCT: 0 %
Eosinophils Absolute: 0 10*3/uL (ref 0–0.7)
HCT: 31.4 % — ABNORMAL LOW (ref 35.0–47.0)
HEMOGLOBIN: 10.6 g/dL — AB (ref 12.0–16.0)
LYMPHS ABS: 0.1 10*3/uL — AB (ref 1.0–3.6)
Lymphocytes Relative: 1 %
MCH: 27.7 pg (ref 26.0–34.0)
MCHC: 33.9 g/dL (ref 32.0–36.0)
MCV: 81.7 fL (ref 80.0–100.0)
MONO ABS: 0.7 10*3/uL (ref 0.2–0.9)
Monocytes Relative: 4 %
Neutro Abs: 14.4 10*3/uL — ABNORMAL HIGH (ref 1.4–6.5)
Neutrophils Relative %: 95 %
PLATELETS: 248 10*3/uL (ref 150–440)
RBC: 3.84 MIL/uL (ref 3.80–5.20)
RDW: 15.4 % — AB (ref 11.5–14.5)
WBC: 15.2 10*3/uL — ABNORMAL HIGH (ref 3.6–11.0)

## 2017-12-29 LAB — BASIC METABOLIC PANEL
Anion gap: 6 (ref 5–15)
BUN: 13 mg/dL (ref 8–23)
CALCIUM: 7.5 mg/dL — AB (ref 8.9–10.3)
CO2: 26 mmol/L (ref 22–32)
CREATININE: 0.54 mg/dL (ref 0.44–1.00)
Chloride: 103 mmol/L (ref 98–111)
GFR calc Af Amer: >60 mL/min (ref >60)
GLUCOSE: 143 mg/dL — AB (ref 70–99)
Potassium: 3.9 mmol/L (ref 3.5–5.1)
SODIUM: 135 mmol/L (ref 135–145)

## 2017-12-29 LAB — PHOSPHORUS: Phosphorus: 1.1 mg/dL — ABNORMAL LOW (ref 2.5–4.6)

## 2017-12-29 LAB — PROCALCITONIN: Procalcitonin: 1.59 ng/mL

## 2017-12-29 LAB — BRAIN NATRIURETIC PEPTIDE: B Natriuretic Peptide: 257 pg/mL — ABNORMAL HIGH (ref 0.0–100.0)

## 2017-12-29 MED ORDER — ONDANSETRON HCL 4 MG/2ML IJ SOLN
4.0000 mg | INTRAMUSCULAR | Status: DC | PRN
  Administered 2017-12-29 – 2018-01-03 (×8): 4 mg via INTRAVENOUS
  Filled 2017-12-29 (×7): qty 2

## 2017-12-29 MED ORDER — METOPROLOL TARTRATE 5 MG/5ML IV SOLN
5.0000 mg | Freq: Four times a day (QID) | INTRAVENOUS | Status: DC
  Administered 2017-12-29 – 2018-01-16 (×66): 5 mg via INTRAVENOUS
  Filled 2017-12-29 (×64): qty 5

## 2017-12-29 MED ORDER — LORAZEPAM 2 MG/ML IJ SOLN
0.5000 mg | INTRAMUSCULAR | Status: DC | PRN
  Administered 2017-12-29 – 2018-01-12 (×19): 0.5 mg via INTRAVENOUS
  Filled 2017-12-29 (×19): qty 1

## 2017-12-29 MED ORDER — SODIUM PHOSPHATES 45 MMOLE/15ML IV SOLN
30.0000 mmol | Freq: Once | INTRAVENOUS | Status: AC
  Administered 2017-12-29: 30 mmol via INTRAVENOUS
  Filled 2017-12-29: qty 10

## 2017-12-29 MED ORDER — DILTIAZEM HCL 60 MG PO TABS
90.0000 mg | ORAL_TABLET | Freq: Four times a day (QID) | ORAL | Status: DC
  Administered 2017-12-29 – 2017-12-31 (×7): 90 mg via ORAL
  Filled 2017-12-29 (×7): qty 1

## 2017-12-29 NOTE — Progress Notes (Signed)
Diltiazem PO ordered, pt currently NPO. Contacted Dr. Alva Garnet and received order for "sips with meds."

## 2017-12-29 NOTE — Progress Notes (Signed)
CRITICAL CARE NOTE  CC  follow up perforated bowel and atrial fibrillation with RVR  SUBJECTIVE Patient remains critically ill Prognosis is guarded  77 year old female who was admitted on 7/23 after presenting to the hospital for evaluation of abdominal pain. Patient continues to be in afib with RVR. Today the patient is more alert and responsive. She appears to be more comfortable.   SIGNIFICANT EVENTS  None overnight  BP 121/83   Pulse (!) 122   Temp 97.6 F (36.4 C) (Oral)   Resp (!) 28   Ht 4\' 11"  (1.499 m)   Wt 72.6 kg (160 lb)   SpO2 94%   BMI 32.32 kg/m    REVIEW OF SYSTEMS  PATIENT IS UNABLE TO PROVIDE COMPLETE REVIEW OF SYSTEM S DUE TO SEVERE CRITICAL ILLNESS AND ENCEPHALOPATHY    PHYSICAL EXAMINATION:  GENERAL:critically ill appearing, +resp distress HEAD: Normocephalic, atraumatic.  EYES: Pupils equal, round, reactive to light.  No scleral icterus.  MOUTH: Moist mucosal membrane. NECK: Supple. No thyromegaly. No nodules. No JVD.  PULMONARY: clear to auscultation bilaterally CARDIOVASCULAR: Irregular rate and rhythm. No murmurs, rubs, or gallops.  GASTROINTESTINAL: Soft, mild distention. Tender to palpation in the RLQ > LLQ. No masses. Diminished bowel sounds. No hepatosplenomegaly.  MUSCULOSKELETAL: No swelling, clubbing, or edema.  NEUROLOGIC: alert and oriented x 3. Unable to tell me why she came to the hospital.  SKIN:intact,warm,dry  ASSESSMENT AND PLAN SYNOPSIS  77 year old female with perforated bowel possibly secondary to diverticulitis with atrial fibrillation with RVR. Patient on amiodarone and diltiazem drip with IV digoxin, blood pressure is holding. She is followed by general surgery and cardiology.   Patient is status DO NOT RESUSCITATE/DO NOT INTUBATE  GASTROINTESTINAL: -Perforated bowel secondary to possible diverticulitis -Dr. Lysle Pearl following: continue medical management -PRN morphine for ab pain and zofran for nausea -Continue NPO  per surgery, may start oral meds   PULMONARY: -respiratory distress secondary to abdominal pain -patient on 2L of O2, improving   AKI with lactic acidosis -resolved -Creatinine 1.18-->0.60-->0.54 -Lactate trending down 3.1-->0.9 -Phosphate 1.7--> 1.1: will replace   CARDIAC -Atrial fibrillation with RVR -Amiodarone drip, metoprolol IV scheduled and PRN, Digoxin IV -Convert Diltiazem drip to PO -Hold eliquis -appreciate cardiology input -TSH for refractory tachycardia/a fib   NEUROLOGY - acute encephalopathy secondary to abdominal infection -slowly resolving, she appears more alert   ID -Levaquin and flagyl for intra abdominal infection -WBC continuing to trend down 16-->15.2 -Procalcitonin trending down: 3.49-->1.59 -Blood cultures: no growth x 2 days   DVT: SCDs, lovenox GI: pepcid  TRANSFUSIONS AS NEEDED MONITOR FSBS ASSESS the need for LABS as needed   Critical Care Time devoted to patient care services described in this note is 32 minutes.   Overall, patient is critically ill, prognosis is guarded.

## 2017-12-29 NOTE — Progress Notes (Signed)
Pt is tachycardic in the 140s.  Pt states that she feels "anxious."  She also has audible wheezing.  The patient states that it is a frequent occurrence with no medical intervention at home.  Pt administered Ativan 0.5mg  IVP and Morphine sulfate 2mg  IVP.

## 2017-12-29 NOTE — Progress Notes (Signed)
RN made Dr. Mortimer Fries aware that patient is complaining of nausea after taking PO cardizem and that per PRN schedule patient can not have med until 1718.  MD gave order for PRN zofran q4H and to given dose now.

## 2017-12-29 NOTE — Progress Notes (Addendum)
Subjective:    Jody [name] is a 77 y.o. female  Hospital stay day 2,  perforated diverticulitis  No acute issues overnight.    ROS:  A 5 point review of systems was performed and pertinent positives and negatives noted in HPI. Objective:      Temp:  [97.6 F (36.4 C)-98.9 F (37.2 C)] 98.1 F (36.7 C) (07/25 1230) Pulse Rate:  [109-144] 122 (07/25 1630) Resp:  [17-35] 30 (07/25 1630) BP: (94-125)/(59-91) 111/75 (07/25 1630) SpO2:  [83 %-97 %] 91 % (07/25 1630)     Height: 4\' 11"  (149.9 cm) Weight: 72.6 kg (160 lb) BMI (Calculated): 32.3   Intake/Output this shift:  Total I/O In: 1613.8 [I.V.:1160.4; IV Piggyback:453.3] Out: 335 [Urine:335]   Intake/Output last 2 shifts:     Constitutional :  alert and cooperative  Respiratory:  clear to auscultation bilaterally  Cardiovascular:  irregularly irregular rhythm  Gastrointestinal: improvement in tenderness over RLQ/suprapubic region.  remaining quadrants remain soft, no rebound, minimal guarding..   Skin: Cool and moist.   Psychiatric: Normal affect, non-agitated, not confused       LABS:  CMP Latest Ref Rng & Units 12/29/2017 12/28/2017 12/27/2017  Glucose 70 - 99 mg/dL 143(H) 148(H) 165(H)  BUN 8 - 23 mg/dL 13 18 33(H)  Creatinine 0.44 - 1.00 mg/dL 0.54 0.60 1.18(H)  Sodium 135 - 145 mmol/L 135 134(L) 135  Potassium 3.5 - 5.1 mmol/L 3.9 3.3(L) 3.7  Chloride 98 - 111 mmol/L 103 104 99  CO2 22 - 32 mmol/L 26 25 27   Calcium 8.9 - 10.3 mg/dL 7.5(L) 7.4(L) 8.0(L)  Total Protein 6.5 - 8.1 g/dL - - -  Total Bilirubin 0.3 - 1.2 mg/dL - - -  Alkaline Phos 38 - 126 U/L - - -  AST 15 - 41 U/L - - -  ALT 14 - 54 U/L - - -   CBC Latest Ref Rng & Units 12/29/2017 12/28/2017 12/27/2017  WBC 3.6 - 11.0 K/uL 15.2(H) 16.0(H) 21.6(H)  Hemoglobin 12.0 - 16.0 g/dL 10.6(L) 10.8(L) 13.2  Hematocrit 35.0 - 47.0 % 31.4(L) 31.6(L) 38.9  Platelets 150 - 440 K/uL 248 226 331    RADS:  Assessment:       Wbc and exam improving.  Will  continue to monitor closely.  afib management per critical care team.  Continue NPO and abx for now.  Care discussed with husband at bedside.  All questions addressed

## 2017-12-29 NOTE — Consult Note (Signed)
Reason for Consult: Atrial fibrillation rapid ventricular response Referring Physician: Dr. Christel Mormon ICU, Dr Lysle Pearl general surgery  Jody [name] is an 77 y.o. female.  HPI: Patient 77 year old female who presented to emergency room with 2 to 3-day history of abdominal discomfort sharp nonradiating focal abdominal pain patient also had nausea she had some palpitations and tachycardia.  Patient states the pain continued to worsen and she began to feel sicker so she finally came to emergency room.  Work-up in the emergency room suggested perforated viscus with free free air thought to be related to diverticulitis patient had atrial fibrillation rapid ventricular response placed on a diltiazem drip denies any chest pain so came to the emergency room for further assessment evaluation  Past Medical History:  Diagnosis Date  . Asthma   . Atrial fibrillation ([place])   . Lung cancer ([place])    10 yrs ago    History reviewed. No pertinent surgical history.  Family History  Problem Relation Age of Onset  . Breast cancer Paternal Aunt     Social History:  reports that she has never smoked. She has never used smokeless tobacco. She reports that she does not drink alcohol. Her drug history is not on file.  Allergies:  Allergies  Allergen Reactions  . Poison Oak Extract Other (See Comments)    blistering  . Ampicillin Rash  . Penicillin V Potassium Rash    Has patient had a PCN reaction causing immediate rash, facial/tongue/throat swelling, SOB or lightheadedness with hypotension: No Has patient had a PCN reaction causing severe rash involving mucus membranes or skin necrosis: No Has patient had a PCN reaction that required hospitalization: No Has patient had a PCN reaction occurring within the last 10 years: No If all of the above answers are "NO", then may proceed with Cephalosporin use.     Medications: I have reviewed the patient's current medications.  Results for orders placed or performed  during the hospital encounter of 12/27/17 (from the past 48 hour(s))  Urinalysis, Complete w Microscopic     Status: Abnormal   Collection Time: 12/27/17 11:27 AM  Result Value Ref Range   Color, Urine AMBER (A) YELLOW    Comment: BIOCHEMICALS MAY BE AFFECTED BY COLOR   APPearance HAZY (A) CLEAR   Specific Gravity, Urine >1.046 (H) 1.005 - 1.030   pH 5.0 5.0 - 8.0   Glucose, UA NEGATIVE NEGATIVE mg/dL   Hgb urine dipstick SMALL (A) NEGATIVE   Bilirubin Urine NEGATIVE NEGATIVE   Ketones, ur NEGATIVE NEGATIVE mg/dL   Protein, ur 100 (A) NEGATIVE mg/dL   Nitrite NEGATIVE NEGATIVE   Leukocytes, UA NEGATIVE NEGATIVE   RBC / HPF 6-10 0 - 5 RBC/hpf   WBC, UA 6-10 0 - 5 WBC/hpf   Bacteria, UA FEW (A) NONE SEEN   Squamous Epithelial / LPF 6-10 0 - 5   Mucus PRESENT     Comment: Performed at [facility], [address] [place], [place] [address]  Glucose, capillary     Status: Abnormal   Collection Time: 12/27/17 12:47 PM  Result Value Ref Range   Glucose-Capillary 166 (H) 70 - 99 mg/dL  MRSA PCR Screening     Status: None   Collection Time: 12/27/17 12:55 PM  Result Value Ref Range   MRSA by PCR NEGATIVE NEGATIVE    Comment:        The GeneXpert MRSA Assay (FDA approved for NASAL specimens only), is one component of a comprehensive MRSA colonization  surveillance program. It is not intended to diagnose MRSA infection nor to guide or monitor treatment for MRSA infections. Performed at [facility], [address] [place], [place] [address]   Blood gas, arterial     Status: Abnormal   Collection Time: 12/27/17  3:38 PM  Result Value Ref Range   FIO2 21.00    pH, Arterial 7.43 7.350 - 7.450   pCO2 arterial 31 (L) 32.0 - 48.0 mmHg   pO2, Arterial 62 (L) 83.0 - 108.0 mmHg   Bicarbonate 20.6 20.0 - 28.0 mmol/L   Acid-base deficit 2.7 (H) 0.0 - 2.0 mmol/L   O2 Saturation 92.1 %   Patient temperature 37.0    Collection site RIGHT BRACHIAL    Sample type  ARTERIAL DRAW    Allens test (pass/fail) PASS PASS    Comment: Performed at [facility], [address] [place], [place] [address]  Basic metabolic panel     Status: Abnormal   Collection Time: 12/28/17  6:21 AM  Result Value Ref Range   Sodium 134 (L) 135 - 145 mmol/L   Potassium 3.3 (L) 3.5 - 5.1 mmol/L   Chloride 104 98 - 111 mmol/L   CO2 25 22 - 32 mmol/L   Glucose, Bld 148 (H) 70 - 99 mg/dL   BUN 18 8 - 23 mg/dL   Creatinine, Ser 0.60 0.44 - 1.00 mg/dL   Calcium 7.4 (L) 8.9 - 10.3 mg/dL   GFR calc non Af Amer >60 >60 mL/min   GFR calc Af Amer >60 >60 mL/min    Comment: (NOTE) The eGFR has been calculated using the CKD EPI equation. This calculation has not been validated in all clinical situations. eGFR's persistently <60 mL/min signify possible Chronic Kidney Disease.    Anion gap 5 5 - 15    Comment: Performed at [facility], [address] [place], [place] [address]  Magnesium     Status: None   Collection Time: 12/28/17  6:21 AM  Result Value Ref Range   Magnesium 1.8 1.7 - 2.4 mg/dL    Comment: Performed at [facility], [address] [place], [place] [address]  Phosphorus     Status: Abnormal   Collection Time: 12/28/17  6:21 AM  Result Value Ref Range   Phosphorus 1.7 (L) 2.5 - 4.6 mg/dL    Comment: Performed at [facility], [address] [place],  [address]  CBC WITH DIFFERENTIAL     Status: Abnormal   Collection Time: 12/28/17  6:21 AM  Result Value Ref Range   WBC 16.0 (H) 3.6 - 11.0 K/uL   RBC 3.87 3.80 - 5.20 MIL/uL   Hemoglobin 10.8 (L) 12.0 - 16.0 g/dL   HCT 31.6 (L) 35.0 - 47.0 %   MCV 81.7 80.0 - 100.0 fL   MCH 28.0 26.0 - 34.0 pg   MCHC 34.2 32.0 - 36.0 g/dL   RDW 15.5 (H) 11.5 - 14.5 %   Platelets 226 150 - 440 K/uL   Neutrophils Relative % 95 %   Neutro Abs 15.1 (H) 1.4 - 6.5 K/uL   Lymphocytes Relative 1 %   Lymphs Abs 0.2 (L) 1.0 - 3.6 K/uL   Monocytes Relative 4 %   Monocytes Absolute  0.7 0.2 - 0.9 K/uL   Eosinophils Relative 0 %   Eosinophils Absolute 0.0 0 - 0.7 K/uL   Basophils Relative 0 %   Basophils Absolute 0.0 0 - 0.1 K/uL    Comment:  Performed at [facility], [address] [place], [place] [address]  Lactate dehydrogenase     Status: Abnormal   Collection Time: 12/28/17  6:21 AM  Result Value Ref Range   LDH 90 (L) 98 - 192 U/L    Comment: Performed at [facility], [address], [place] [address]  Lactic acid, plasma     Status: None   Collection Time: 12/28/17  6:21 AM  Result Value Ref Range   Lactic Acid, Venous 0.9 0.5 - 1.9 mmol/L    Comment: Performed at [facility], [address] [place], [place] [address]  Procalcitonin - Baseline     Status: None   Collection Time: 12/28/17  6:21 AM  Result Value Ref Range   Procalcitonin 3.49 ng/mL    Comment:        Interpretation: PCT > 2 ng/mL: Systemic infection (sepsis) is likely, unless other causes are known. (NOTE)       Sepsis PCT Algorithm           Lower Respiratory Tract                                      Infection PCT Algorithm    ----------------------------     ----------------------------         PCT < 0.25 ng/mL                PCT < 0.10 ng/mL         Strongly encourage             Strongly discourage   discontinuation of antibiotics    initiation of antibiotics    ----------------------------     -----------------------------       PCT 0.25 - 0.50 ng/mL            PCT 0.10 - 0.25 ng/mL               OR       >80% decrease in PCT            Discourage initiation of                                            antibiotics      Encourage discontinuation           of antibiotics    ----------------------------     -----------------------------         PCT >= 0.50 ng/mL              PCT 0.26 - 0.50 ng/mL               AND       <80% decrease in PCT              Encourage initiation of                                             antibiotics        Encourage continuation           of antibiotics    ----------------------------     -----------------------------  PCT >= 0.50 ng/mL                  PCT > 0.50 ng/mL               AND         increase in PCT                  Strongly encourage                                      initiation of antibiotics    Strongly encourage escalation           of antibiotics                                     -----------------------------                                           PCT <= 0.25 ng/mL                                                 OR                                        > 80% decrease in PCT                                     Discontinue / Do not initiate                                             antibiotics Performed at [facility], [address] [place], [place] [address]   Basic metabolic panel     Status: Abnormal   Collection Time: 12/29/17  4:55 AM  Result Value Ref Range   Sodium 135 135 - 145 mmol/L   Potassium 3.9 3.5 - 5.1 mmol/L   Chloride 103 98 - 111 mmol/L   CO2 26 22 - 32 mmol/L   Glucose, Bld 143 (H) 70 - 99 mg/dL   BUN 13 8 - 23 mg/dL   Creatinine, Ser 0.54 0.44 - 1.00 mg/dL   Calcium 7.5 (L) 8.9 - 10.3 mg/dL   GFR calc non Af Amer >60 >60 mL/min   GFR calc Af Amer >60 >60 mL/min    Comment: (NOTE) The eGFR has been calculated using the CKD EPI equation. This calculation has not been validated in all clinical situations. eGFR's persistently <60 mL/min signify possible Chronic Kidney Disease.    Anion gap 6 5 - 15    Comment: Performed at [facility], [address] [place],  [address]  Magnesium     Status: None   Collection Time: 12/29/17  4:55 AM  Result Value Ref Range   Magnesium 1.9 1.7 - 2.4  mg/dL    Comment: Performed at [facility], [address] [place], [place] [address]  Phosphorus     Status: Abnormal   Collection Time: 12/29/17  4:55 AM  Result Value Ref Range   Phosphorus 1.1  (L) 2.5 - 4.6 mg/dL    Comment: Performed at [facility], [address] [place], [place] [address]  CBC WITH DIFFERENTIAL     Status: Abnormal   Collection Time: 12/29/17  4:55 AM  Result Value Ref Range   WBC 15.2 (H) 3.6 - 11.0 K/uL   RBC 3.84 3.80 - 5.20 MIL/uL   Hemoglobin 10.6 (L) 12.0 - 16.0 g/dL   HCT 31.4 (L) 35.0 - 47.0 %   MCV 81.7 80.0 - 100.0 fL   MCH 27.7 26.0 - 34.0 pg   MCHC 33.9 32.0 - 36.0 g/dL   RDW 15.4 (H) 11.5 - 14.5 %   Platelets 248 150 - 440 K/uL   Neutrophils Relative % 95 %   Neutro Abs 14.4 (H) 1.4 - 6.5 K/uL   Lymphocytes Relative 1 %   Lymphs Abs 0.1 (L) 1.0 - 3.6 K/uL   Monocytes Relative 4 %   Monocytes Absolute 0.7 0.2 - 0.9 K/uL   Eosinophils Relative 0 %   Eosinophils Absolute 0.0 0 - 0.7 K/uL   Basophils Relative 0 %   Basophils Absolute 0.0 0 - 0.1 K/uL    Comment: Performed at [facility], [address] [place], [place] [address]  Procalcitonin     Status: None   Collection Time: 12/29/17  4:55 AM  Result Value Ref Range   Procalcitonin 1.59 ng/mL    Comment:        Interpretation: PCT > 0.5 ng/mL and <= 2 ng/mL: Systemic infection (sepsis) is possible, but other conditions are known to elevate PCT as well. (NOTE)       Sepsis PCT Algorithm           Lower Respiratory Tract                                      Infection PCT Algorithm    ----------------------------     ----------------------------         PCT < 0.25 ng/mL                PCT < 0.10 ng/mL         Strongly encourage             Strongly discourage   discontinuation of antibiotics    initiation of antibiotics    ----------------------------     -----------------------------       PCT 0.25 - 0.50 ng/mL            PCT 0.10 - 0.25 ng/mL               OR       >80% decrease in PCT            Discourage initiation of                                            antibiotics      Encourage discontinuation           of antibiotics     ----------------------------     -----------------------------  PCT >= 0.50 ng/mL              PCT 0.26 - 0.50 ng/mL                AND       <80% decrease in PCT             Encourage initiation of                                             antibiotics       Encourage continuation           of antibiotics    ----------------------------     -----------------------------        PCT >= 0.50 ng/mL                  PCT > 0.50 ng/mL               AND         increase in PCT                  Strongly encourage                                      initiation of antibiotics    Strongly encourage escalation           of antibiotics                                     -----------------------------                                           PCT <= 0.25 ng/mL                                                 OR                                        > 80% decrease in PCT                                     Discontinue / Do not initiate                                             antibiotics Performed at [facility], [address] [place], [place] [address]   Brain natriuretic peptide     Status: Abnormal   Collection Time: 12/29/17  4:55 AM  Result Value Ref Range   B Natriuretic Peptide 257.0 (H) 0.0 - 100.0 pg/mL    Comment: Performed at [facility], [address], [place], [place] [address]  No results found.  Review of Systems  Constitutional: Positive for chills, diaphoresis, fever, malaise/fatigue and weight loss.  HENT: Negative.   Eyes: Negative.   Respiratory: Positive for shortness of breath.   Cardiovascular: Negative.   Gastrointestinal: Positive for abdominal pain and heartburn.  Genitourinary: Negative.   Skin: Negative.   Neurological: Negative.   Endo/Heme/Allergies: Negative.   Psychiatric/Behavioral: Negative.    Blood pressure 118/76, pulse (!) 132, temperature 98.9 F (37.2 C), resp. rate (!) 25, height _0  (1.499 m), weight  160 lb (72.6 kg), SpO2 92 %. Physical Exam  Nursing note and vitals reviewed. Constitutional: She appears well-developed and well-nourished.  HENT:  Head: Normocephalic and atraumatic.  Eyes: Pupils are equal, round, and reactive to light. Conjunctivae and EOM are normal.  Neck: Normal range of motion. Neck supple.  Cardiovascular: Normal heart sounds and normal pulses. An irregularly irregular rhythm present. Tachycardia present.    Assessment/Plan:  Atrial fibrillation rapid ventricular response Abdominal pain Diverticulitis with perforation Hypertension Lung cancer Borderline diabetes . Plan Agree with admit to ICU Agree with broad-spectrum antibiotic therapy Recommend bowel rest Continue rate control for atrial fibrillation with Cardizem Recommend add amiodarone Consider short-term anticoagulation for A. fib as well as DVT Continue lipid management GERD therapy with omeprazole Consider echocardiogram Continue conservative nonaggressive therapy for A. fib for now  Rachele Lamaster D Aris Even 12/29/2017, 11:26 AM

## 2017-12-30 ENCOUNTER — Inpatient Hospital Stay: Payer: Medicare Other

## 2017-12-30 ENCOUNTER — Inpatient Hospital Stay: Payer: Self-pay

## 2017-12-30 LAB — BASIC METABOLIC PANEL
ANION GAP: 6 (ref 5–15)
BUN: 11 mg/dL (ref 8–23)
CHLORIDE: 101 mmol/L (ref 98–111)
CO2: 28 mmol/L (ref 22–32)
CREATININE: 0.56 mg/dL (ref 0.44–1.00)
Calcium: 7.6 mg/dL — ABNORMAL LOW (ref 8.9–10.3)
GLUCOSE: 132 mg/dL — AB (ref 70–99)
Potassium: 3.3 mmol/L — ABNORMAL LOW (ref 3.5–5.1)
Sodium: 135 mmol/L (ref 135–145)

## 2017-12-30 LAB — PHOSPHORUS
PHOSPHORUS: 1.4 mg/dL — AB (ref 2.5–4.6)
PHOSPHORUS: 2.2 mg/dL — AB (ref 2.5–4.6)

## 2017-12-30 LAB — TSH: TSH: 2.386 u[IU]/mL (ref 0.350–4.500)

## 2017-12-30 LAB — GLUCOSE, CAPILLARY
GLUCOSE-CAPILLARY: 120 mg/dL — AB (ref 70–99)
GLUCOSE-CAPILLARY: 148 mg/dL — AB (ref 70–99)
GLUCOSE-CAPILLARY: 180 mg/dL — AB (ref 70–99)

## 2017-12-30 LAB — CBC WITH DIFFERENTIAL/PLATELET
BASOS ABS: 0.1 10*3/uL (ref 0–0.1)
BASOS PCT: 0 %
EOS ABS: 0 10*3/uL (ref 0–0.7)
EOS PCT: 0 %
HCT: 31.5 % — ABNORMAL LOW (ref 35.0–47.0)
Hemoglobin: 10.7 g/dL — ABNORMAL LOW (ref 12.0–16.0)
Lymphocytes Relative: 1 %
Lymphs Abs: 0.2 10*3/uL — ABNORMAL LOW (ref 1.0–3.6)
MCH: 27.6 pg (ref 26.0–34.0)
MCHC: 33.9 g/dL (ref 32.0–36.0)
MCV: 81.6 fL (ref 80.0–100.0)
MONO ABS: 0.9 10*3/uL (ref 0.2–0.9)
Monocytes Relative: 6 %
Neutro Abs: 15.1 10*3/uL — ABNORMAL HIGH (ref 1.4–6.5)
Neutrophils Relative %: 93 %
PLATELETS: 263 10*3/uL (ref 150–440)
RBC: 3.87 MIL/uL (ref 3.80–5.20)
RDW: 16.1 % — ABNORMAL HIGH (ref 11.5–14.5)
WBC: 16.3 10*3/uL — ABNORMAL HIGH (ref 3.6–11.0)

## 2017-12-30 LAB — MAGNESIUM: MAGNESIUM: 1.9 mg/dL (ref 1.7–2.4)

## 2017-12-30 LAB — PROCALCITONIN: Procalcitonin: 0.81 ng/mL

## 2017-12-30 LAB — POTASSIUM: Potassium: 3.3 mmol/L — ABNORMAL LOW (ref 3.5–5.1)

## 2017-12-30 LAB — SODIUM: Sodium: 134 mmol/L — ABNORMAL LOW (ref 135–145)

## 2017-12-30 MED ORDER — POTASSIUM PHOSPHATES 15 MMOLE/5ML IV SOLN
20.0000 mmol | Freq: Once | INTRAVENOUS | Status: AC
  Administered 2017-12-30: 20 mmol via INTRAVENOUS
  Filled 2017-12-30: qty 6.67

## 2017-12-30 MED ORDER — FAMOTIDINE IN NACL 20-0.9 MG/50ML-% IV SOLN
20.0000 mg | INTRAVENOUS | Status: DC
  Administered 2017-12-31 – 2018-01-13 (×13): 20 mg via INTRAVENOUS
  Filled 2017-12-30 (×12): qty 50

## 2017-12-30 MED ORDER — SODIUM CHLORIDE 0.9% FLUSH
10.0000 mL | Freq: Two times a day (BID) | INTRAVENOUS | Status: DC
  Administered 2017-12-30: 40 mL
  Administered 2017-12-31: 10 mL
  Administered 2017-12-31: 40 mL
  Administered 2018-01-01: 30 mL
  Administered 2018-01-01: 40 mL
  Administered 2018-01-02 (×2): 10 mL
  Administered 2018-01-04: 40 mL
  Administered 2018-01-05: 30 mL
  Administered 2018-01-06 – 2018-01-07 (×2): 10 mL

## 2017-12-30 MED ORDER — IOPAMIDOL (ISOVUE-300) INJECTION 61%
100.0000 mL | Freq: Once | INTRAVENOUS | Status: AC | PRN
  Administered 2017-12-30: 100 mL via INTRAVENOUS

## 2017-12-30 MED ORDER — SODIUM PHOSPHATES 45 MMOLE/15ML IV SOLN
30.0000 mmol | Freq: Once | INTRAVENOUS | Status: AC
  Administered 2017-12-30: 30 mmol via INTRAVENOUS
  Filled 2017-12-30: qty 10

## 2017-12-30 MED ORDER — TRACE MINERALS CR-CU-MN-SE-ZN 10-1000-500-60 MCG/ML IV SOLN
INTRAVENOUS | Status: AC
  Administered 2017-12-30: 19:00:00 via INTRAVENOUS
  Filled 2017-12-30: qty 996

## 2017-12-30 MED ORDER — SODIUM CHLORIDE 0.9% FLUSH: 10.0000 mL | INTRAVENOUS | Status: DC | PRN

## 2017-12-30 MED ORDER — INSULIN ASPART 100 UNIT/ML ~~LOC~~ SOLN
0.0000 [IU] | Freq: Four times a day (QID) | SUBCUTANEOUS | Status: DC
  Administered 2017-12-31 (×4): 2 [IU] via SUBCUTANEOUS
  Administered 2018-01-01: 3 [IU] via SUBCUTANEOUS
  Administered 2018-01-01 (×2): 2 [IU] via SUBCUTANEOUS
  Administered 2018-01-01: 1 [IU] via SUBCUTANEOUS
  Administered 2018-01-02: 2 [IU] via SUBCUTANEOUS
  Administered 2018-01-02: 3 [IU] via SUBCUTANEOUS
  Filled 2017-12-30 (×10): qty 1

## 2017-12-30 MED ORDER — LEVOFLOXACIN IN D5W 750 MG/150ML IV SOLN
750.0000 mg | INTRAVENOUS | Status: DC
  Administered 2017-12-31 – 2018-01-05 (×6): 750 mg via INTRAVENOUS
  Filled 2017-12-30 (×7): qty 150

## 2017-12-30 MED ORDER — POTASSIUM CHLORIDE 10 MEQ/100ML IV SOLN
10.0000 meq | INTRAVENOUS | Status: AC
  Administered 2017-12-30 (×2): 10 meq via INTRAVENOUS
  Filled 2017-12-30 (×2): qty 100

## 2017-12-30 NOTE — Progress Notes (Signed)
Peripherally Inserted Central Catheter/Midline Placement  The IV Nurse has discussed with the patient and/or persons authorized to consent for the patient, the purpose of this procedure and the potential benefits and risks involved with this procedure.  The benefits include less needle sticks, lab draws from the catheter, and the patient may be discharged home with the catheter. Risks include, but not limited to, infection, bleeding, blood clot (thrombus formation), and puncture of an artery; nerve damage and irregular heartbeat and possibility to perform a PICC exchange if needed/ordered by physician.  Alternatives to this procedure were also discussed.  Bard Power PICC patient education guide, fact sheet on infection prevention and patient information card has been provided to patient /or left at bedside.    PICC/Midline Placement Documentation  PICC Triple Lumen 45/80/99 PICC Right Basilic 34 cm 0 cm (Active)  Indication for Insertion or Continuance of Line Administration of hyperosmolar/irritating solutions (i.e. TPN, Vancomycin, etc.) 12/30/2017  4:30 PM  Exposed Catheter (cm) 0 cm 12/30/2017  4:30 PM  Site Assessment Clean;Dry;Intact 12/30/2017  4:30 PM  Lumen #1 Status Flushed;Saline locked;Blood return noted 12/30/2017  4:30 PM  Lumen #2 Status Flushed;Saline locked;Blood return noted 12/30/2017  4:30 PM  Lumen #3 Status Flushed;Saline locked;Blood return noted 12/30/2017  4:30 PM  Dressing Type Transparent 12/30/2017  4:30 PM  Dressing Status Clean;Dry;Intact;Antimicrobial disc in place 12/30/2017  4:30 PM  Dressing Change Due 01/06/18 12/30/2017  4:30 PM       Gordan Payment 12/30/2017, 4:31 PM

## 2017-12-30 NOTE — Progress Notes (Signed)
Initial Nutrition Assessment  DOCUMENTATION CODES:   Not applicable  INTERVENTION:   Recommend begin Clinimix 5/15 with electrolytes, trace elements, and multivitamin at 29mL/hr  Advance to goal rate of 37mL/hr tomorrow (7/27) if CBGs and electrolytes are acceptable At goal, provides 1414 calories and 100 grams of protein  Hold lipids for 7 days according to ASPEN/SCCM guidelines  Monitor CBGs, K+, PO4 and Magneisum closely, patient is at risk for refeeding with tomorrow being her 7th day with little to no PO intake  NUTRITION DIAGNOSIS:   Inadequate oral intake related to acute illness(colon perf) as evidenced by per patient/family report, NPO status.  GOAL:   Provide needs based on ASPEN/SCCM guidelines  MONITOR:   I & O's, Labs  REASON FOR ASSESSMENT:   Consult New TPN/TNA  ASSESSMENT:   Patient with PMH of A-fib, Lung Cancer, Asthma, HTN, and borderline DM presents with abdominal pain. On CT she was noted to have diffuse inflammation of the distal jejenum extending to the descending/sigmoid colon with perforation which may be secondary to diverticulitis.    She is at high risk for surgical exploration, but is currently being treated medically with antibiotics. Husband states the patient has not eaten anything since Sunday when her symptoms began. They deny any weight loss. Normally PO intake consits of a peanut butter sandwich for breakfast, a soup or sandwich for lunch, and a cooked meal like spaghetti or meat and vegetables with a starch for dinner. RD consulted for new TPN which seems appropriate with possible need for immediate surgical intervention, NPO status, and tomorrow (7/27) being her 7th day without adequate PO intake.  Labs reviewed:  K+ 3.3, PO4 1.4  Medications reviewed and include:  Amiodarone gtt, Cardizem gtt LR at 64mL/hr IV PO4   Intake/Output Summary (Last 24 hours) at 12/30/2017 1445 Last data filed at 12/30/2017 1418 Gross per 24 hour   Intake 2879.93 ml  Output 890 ml  Net 1989.93 ml   7.4L Fluid Positive  NUTRITION - FOCUSED PHYSICAL EXAM:    Most Recent Value  Orbital Region  No depletion  Upper Arm Region  No depletion  Thoracic and Lumbar Region  No depletion  Buccal Region  No depletion  Temple Region  Mild depletion  Clavicle Bone Region  Mild depletion  Clavicle and Acromion Bone Region  Mild depletion  Scapular Bone Region  Unable to assess  Dorsal Hand  No depletion  Patellar Region  No depletion  Anterior Thigh Region  No depletion  Posterior Calf Region  Mild depletion  Edema (RD Assessment)  Mild  Hair  Reviewed  Eyes  Reviewed  Mouth  Reviewed  Skin  Reviewed  Nails  Reviewed       Diet Order:   Diet Order           Diet NPO time specified Except for: Sips with Meds  Diet effective now          EDUCATION NEEDS:   Not appropriate for education at this time  Skin:  Skin Assessment: Reviewed RN Assessment  Last BM:  PTA  Height:   Ht Readings from Last 1 Encounters:  12/27/17 4\' 11"  (1.499 m)    Weight:   Wt Readings from Last 1 Encounters:  12/27/17 160 lb (72.6 kg)    Ideal Body Weight:  44.69 kg  BMI:  Body mass index is 32.32 kg/m.  Estimated Nutritional Needs:   Kcal:  1500-1700 calories  Protein:  87-102 grams (1.2-1.4g/kg)  Fluid:  >  1.5L    Satira Anis. Kodey Xue, MS, RD LDN Inpatient Clinical Dietitian Pager [phone]

## 2017-12-30 NOTE — Progress Notes (Addendum)
CRITICAL CARE NOTE  CC  follow up perforated bowel secondary to diverticulitis and chronic atrial fibrillation with RVR  SUBJECTIVE Patient remains critically ill Prognosis is guarded  77 year old female who was admitted on 7/23 after presenting to the hospital for evaluation of abdominal pain. On CT scan patient noted to have diffuse inflammation of the distal jejunum extending to the descending/sigmoid colon with perforation. Patient also found to be in afib with RVR. Patient has been medically managed with close follow up by surgery.   This morning she appears drowsy, but is alert and oriented. She tells me she is in no pain, but that she doesn't feel good. She denies passing gas. Her husbands states she became very anxious last night and was asking to go home.   SIGNIFICANT EVENTS Patient became anxious last night with tachycardia into the 140s. She was given 0.5mg  of Ativan and 2mg  of Morphine with relief.    BP 106/72   Pulse (!) 107   Temp 97.8 F (36.6 C) (Axillary)   Resp (!) 27   Ht 4\' 11"  (1.499 m)   Wt 72.6 kg (160 lb)   SpO2 91%   BMI 32.32 kg/m    REVIEW OF SYSTEMS  PATIENT IS UNABLE TO PROVIDE COMPLETE REVIEW OF SYSTEM S DUE TO SEVERE CRITICAL ILLNESS AND ENCEPHALOPATHY   PHYSICAL EXAMINATION:  GENERAL:critically ill appearing, -resp distress HEAD: Normocephalic, atraumatic.  EYES: Pupils equal, round, reactive to light.  No scleral icterus.  MOUTH: Moist mucosal membrane. NECK: Supple. No thyromegaly. No nodules. No JVD.  PULMONARY: +rhonchi, -wheezing, +accessory muscle use CARDIOVASCULAR: S1 and S2. Irregular rate and rhythm, tachycardic. No murmurs, rubs, or gallops. 2+ distal pulses bilaterally  GASTROINTESTINAL: Tenderness to palpation in the LLQ>RLQ, +guarding. Mild distention. No rebound tenderness. Absent bowel sounds. No hepatosplenomegaly.  MUSCULOSKELETAL: No swelling, clubbing, or edema.  NEUROLOGIC: alert and oriented x  3 SKIN:intact,warm,dry  ASSESSMENT AND PLAN SYNOPSIS  77 year old female with perforated bowel secondary to diverticulitis with atrial fibrillation. Patient continues to be on amiodarone and diltiazem drip with IV digoxin and Metoprolol. Patient able to take Diltiazem PO, but requires Zofran with each dose.   Patient is status DO NOT RESUSCITATE/DO NOT INTUBATE  GASTROINTESTINAL -perforated bowel secondary to possible diverticulitis -Dr. Lysle Pearl following, continue medical management -PRN morphine for pain/resp distress, zofran for nausea -NPO per surgery -repeat CT abdomen/pelvis with contrast 7/26  PULMONARY -respiratory distress secondary to abdominal pain and anxiety -patient on 2L of O2  AKI with lactic acidosis -resolved -Creatinine: 1.18--> 0.60-->0.54-->0.56 -Lactate: 3.2-->3.1-->0.9 -Phosphate: replaced yesterday to 1.4, need further replacement -Potassium: 3.3, will replace  CARDIAC -Chronic Atrial fibrillation with RVR -amiodarone drip, diltiazem drip, metoprolol IV, Digoxin IV -TSH normal (2.386) -hold eliquis, appreciate cardiology input.  -Patient with nausea on diltiazem PO and continuing to require IV diltiazem.    NEUROLOGY -acute encephalopathy secondary to abdominal infection -slowly resolving, she appears drowsy today likely secondary to morphine and ativan for anxiety.   ID -Levaquin and Flagyl  -Procalcitonin: 3.49-->1.59-->0.81 -Blood cultures: no growth x 3 days   DVT: Lovenox GI: Pepcid  TRANSFUSIONS AS NEEDED MONITOR FSBS ASSESS the need for LABS as needed   Critical Care Time devoted to patient care services described in this note is 31 minutes.   Overall, patient is critically ill, prognosis is guarded.  Patient with Multiorgan failure and at high risk for cardiac arrest and death.   Hermelinda Dellen, DO

## 2017-12-30 NOTE — Progress Notes (Signed)
Cardizem drip titrated down to 5 mg/H.  Amio drip remains at 30 mg/H. No nausea or vomiting during shift. 525 cc UOP this shift.  Husband at bedside.  Patient has rested well.

## 2017-12-30 NOTE — Progress Notes (Signed)
PHARMACY - ADULT TOTAL PARENTERAL NUTRITION CONSULT NOTE   Pharmacy Consult for TPN Management  Indication: colon perforation, NPO Status   Patient Measurements: Height: 4\' 11"  (149.9 cm) Weight: 160 lb (72.6 kg) IBW/kg (Calculated) : 43.2 TPN AdjBW (KG): 50.5 Body mass index is 32.32 kg/m.   Assessment:   Pharmacy consulted for TPN management for 77 yo female with diffuse inflammation on distal jejunum extending to sigmoid colon with perforation. Patient has been NPO since Sunday. Patient is NPO and at risk for refeeding.  TPN Access: PICC Triple Lumen  TPN start date: 12/30/17 Nutritional Goals (per RD recommendation on 7/26): KCal: 1500-1700 kcal  Protein: 87-102 grams  Fluid: > 1.5 L Goal TPN rate is 83 ml/hr (provides 1414 kcal and 100 grams of protein)  Current Nutrition:   Plan:  Clinimix E 5/15 at 9mL/hr.  Hold 20% lipid emulsion for first 7 days for ICU patients per ASPEN guidelines (Start date 8/2)  Add MVI, trace elements to TPN Q6hr SSI and adjust as needed  Patient received potassium 51mEq IV x 2 doses, sodium phos 32mmol IV x 1, potassium phos 79mmol IV x 1 on 7/26. Will need continued IV replacement for goal potassium ~ 4, goal magnesium ~ 2, and goal phosphorus 2.5-3.   MIVF: LR at 63mL/hr.   Monitor TPN labs, Daily x 3 days and per protocol   Simpson,Michael L 12/30/2017,8:32 PM

## 2017-12-31 LAB — COMPREHENSIVE METABOLIC PANEL
ALT: 8 U/L (ref 0–44)
AST: 12 U/L — AB (ref 15–41)
Albumin: 2.1 g/dL — ABNORMAL LOW (ref 3.5–5.0)
Alkaline Phosphatase: 47 U/L (ref 38–126)
Anion gap: 6 (ref 5–15)
BUN: 11 mg/dL (ref 8–23)
CHLORIDE: 98 mmol/L (ref 98–111)
CO2: 29 mmol/L (ref 22–32)
Calcium: 7.3 mg/dL — ABNORMAL LOW (ref 8.9–10.3)
Creatinine, Ser: 0.5 mg/dL (ref 0.44–1.00)
GFR calc non Af Amer: >60 mL/min (ref >60)
Glucose, Bld: 186 mg/dL — ABNORMAL HIGH (ref 70–99)
POTASSIUM: 3.3 mmol/L — AB (ref 3.5–5.1)
SODIUM: 133 mmol/L — AB (ref 135–145)
Total Bilirubin: 0.4 mg/dL (ref 0.3–1.2)
Total Protein: 4.9 g/dL — ABNORMAL LOW (ref 6.5–8.1)

## 2017-12-31 LAB — GLUCOSE, CAPILLARY
GLUCOSE-CAPILLARY: 177 mg/dL — AB (ref 70–99)
GLUCOSE-CAPILLARY: 148 mg/dL — AB (ref 70–99)
GLUCOSE-CAPILLARY: 159 mg/dL — AB (ref 70–99)
GLUCOSE-CAPILLARY: 177 mg/dL — AB (ref 70–99)
Glucose-Capillary: 176 mg/dL — ABNORMAL HIGH (ref 70–99)
Glucose-Capillary: 162 mg/dL — ABNORMAL HIGH (ref 70–99)

## 2017-12-31 LAB — CBC WITH DIFFERENTIAL/PLATELET
BASOS PCT: 0 %
Basophils Absolute: 0 10*3/uL (ref 0–0.1)
Eosinophils Absolute: 0.1 10*3/uL (ref 0–0.7)
Eosinophils Relative: 0 %
HCT: 30.8 % — ABNORMAL LOW (ref 35.0–47.0)
HEMOGLOBIN: 10.7 g/dL — AB (ref 12.0–16.0)
LYMPHS ABS: 0.2 10*3/uL — AB (ref 1.0–3.6)
LYMPHS PCT: 1 %
MCH: 28.2 pg (ref 26.0–34.0)
MCHC: 34.7 g/dL (ref 32.0–36.0)
MCV: 81.3 fL (ref 80.0–100.0)
MONO ABS: 0.9 10*3/uL (ref 0.2–0.9)
MONOS PCT: 6 %
NEUTROS ABS: 14.6 10*3/uL — AB (ref 1.4–6.5)
NEUTROS PCT: 93 %
PLATELETS: 288 10*3/uL (ref 150–440)
RBC: 3.79 MIL/uL — ABNORMAL LOW (ref 3.80–5.20)
RDW: 16.4 % — ABNORMAL HIGH (ref 11.5–14.5)
WBC: 15.7 10*3/uL — ABNORMAL HIGH (ref 3.6–11.0)

## 2017-12-31 LAB — TRIGLYCERIDES: Triglycerides: 44 mg/dL (ref <150)

## 2017-12-31 LAB — PHOSPHORUS: Phosphorus: 2.4 mg/dL — ABNORMAL LOW (ref 2.5–4.6)

## 2017-12-31 LAB — MAGNESIUM: Magnesium: 2 mg/dL (ref 1.7–2.4)

## 2017-12-31 MED ORDER — TRACE MINERALS CR-CU-MN-SE-ZN 10-1000-500-60 MCG/ML IV SOLN
INTRAVENOUS | Status: AC
  Administered 2017-12-31: 18:00:00 via INTRAVENOUS
  Filled 2017-12-31: qty 996

## 2017-12-31 MED ORDER — FUROSEMIDE 10 MG/ML IJ SOLN
40.0000 mg | Freq: Once | INTRAMUSCULAR | Status: AC
  Administered 2017-12-31: 40 mg via INTRAVENOUS
  Filled 2017-12-31: qty 4

## 2017-12-31 MED ORDER — POTASSIUM PHOSPHATES 15 MMOLE/5ML IV SOLN
15.0000 mmol | Freq: Once | INTRAVENOUS | Status: AC
  Administered 2017-12-31: 15 mmol via INTRAVENOUS
  Filled 2017-12-31: qty 5

## 2017-12-31 MED ORDER — TRACE MINERALS CR-CU-MN-SE-ZN 10-1000-500-60 MCG/ML IV SOLN: INTRAVENOUS | Status: DC

## 2017-12-31 NOTE — Progress Notes (Signed)
PHARMACY - ADULT TOTAL PARENTERAL NUTRITION CONSULT NOTE   Pharmacy Consult for TPN Management  Indication: colon perforation, NPO Status   Patient Measurements: Height: 4\' 11"  (149.9 cm) Weight: 160 lb (72.6 kg) IBW/kg (Calculated) : 43.2 TPN AdjBW (KG): 50.5 Body mass index is 32.32 kg/m.   Assessment:   Pharmacy consulted for TPN management for 77 yo female with diffuse inflammation on distal jejunum extending to sigmoid colon with perforation. Patient has been NPO since Sunday. Patient is NPO and at risk for refeeding.  TPN Access: PICC Triple Lumen  TPN start date: 12/30/17 Nutritional Goals (per RD recommendation on 7/26): KCal: 1500-1700 kcal  Protein: 87-102 grams  Fluid: > 1.5 L Goal TPN rate is 83 ml/hr (provides 1414 kcal and 100 grams of protein)  Current Nutrition:   Plan:  Clinimix E 5/15 at 43mL/hr.  Hold 20% lipid emulsion for first 7 days for ICU patients per ASPEN guidelines (Start date 8/2)  Add MVI, trace elements to TPN Q6hr SSI and adjust as needed  Will give KPhos 15 mmol IV x 1.  Will need continued IV replacement for goal potassium ~ 4, goal magnesium ~ 2, and goal phosphorus 2.5-3.   MIVF: LR at 12mL/hr.   Monitor TPN labs, Daily x 3 days and per protocol   Morningstar Toft D 12/31/2017,10:16 AM

## 2017-12-31 NOTE — Progress Notes (Signed)
CRITICAL CARE NOTE  CC  follow up perforated bowel secondary to diverticulitis and chronic atrial fibrillation with RVR  SUBJECTIVE Patient remains critically ill Prognosis is guarded  77 year old female who was admitted on 7/23 after presenting to the hospital for evaluation of abdominal pain. On CT scan patient noted to have diffuse inflammation of the distal jejunum extending to the descending/sigmoid colon with perforation. Patient also found to be in afib with RVR. Patient has been medically managed with close follow up by surgery.   SIGNIFICANT EVENTS No significant changes noted overnight. Patient complaining of some mild shortness of breath and less lethargic than yesterday, repeat abdominal CT scan performed. See report  BP 116/65   Pulse (!) 104   Temp 98 F (36.7 C) (Axillary)   Resp (!) 25   Ht 4\' 11"  (1.499 m)   Wt 160 lb (72.6 kg)   SpO2 94%   BMI 32.32 kg/m    REVIEW OF SYSTEMS  PATIENT IS UNABLE TO PROVIDE COMPLETE REVIEW OF SYSTEM S DUE TO SEVERE CRITICAL ILLNESS AND ENCEPHALOPATHY   PHYSICAL EXAMINATION:  GENERAL:critically ill appearing, -resp distress HEAD: Normocephalic, atraumatic.  EYES: Pupils equal, round, reactive to light.  No scleral icterus.  MOUTH: Moist mucosal membrane. NECK: Supple. No thyromegaly. No nodules. No JVD.  PULMONARY: +rhonchi, -wheezing, +accessory muscle use CARDIOVASCULAR: S1 and S2. Irregular rate and rhythm, tachycardic. No murmurs, rubs, or gallops. 2+ distal pulses bilaterally  GASTROINTESTINAL: Tenderness to palpation in the LLQ>RLQ, +guarding. Mild distention. No rebound tenderness. Absent bowel sounds. No hepatosplenomegaly.  MUSCULOSKELETAL: No swelling, clubbing, or edema.  NEUROLOGIC: alert and oriented x 3 SKIN:intact,warm,dry  ASSESSMENT AND PLAN SYNOPSIS  77 year old female with perforated bowel secondary to diverticulitis with atrial fibrillation. Being followed conservatively right now per surgery. Repeat  abdominal CT noted. Not walled off enough for percutaneous drain, PICC line was placed and started on TPN  Atrial fibrillation. Ventricular responses improved this morning. Presently on amiodarone, Cardizem, digoxin with as needed beta blockade. Appreciate cardiology input and assistance with management. We'll try to convert Cardizem to by mouth and stop diltiazem drip  Hypokalemia. On replacement  Hyperglycemia. On scale coverage  Leukocytosis at 15.7. Presently on Levaquin and Flagyl  Anemia. No evidence of active bleeding    DVT: Lovenox GI: Pepcid  TRANSFUSIONS AS NEEDED MONITOR FSBS ASSESS the need for LABS as needed  Overall, patient is critically ill, prognosis is guarded.  Patient with Multiorgan failure and at high risk for cardiac arrest and death.   Jody Dellen, DO    Patient ID: Jody [name], female   DOB: 10-03-1940, 77 y.o.   MRN: [id number]

## 2017-12-31 NOTE — Progress Notes (Signed)
Patient ID: Jody [name], female   DOB: January 04, 1941, 77 y.o.   MRN: [id number]     [place] Hospital Day(s): 4.   Post op day(s):  Marland Kitchen   Interval History: Patient seen and examined, no acute events or new complaints overnight. Patient reports pain is improving.   Vital signs in last 24 hours: [min-max] current  Temp:  [97.9 F (36.6 C)-98.2 F (36.8 C)] 98 F (36.7 C) (07/27 0200) Pulse Rate:  [90-125] 104 (07/27 0600) Resp:  [21-39] 25 (07/27 0600) BP: (99-143)/(57-89) 116/65 (07/27 0600) SpO2:  [88 %-94 %] 94 % (07/27 0846)     Height: 4\' 11"  (149.9 cm) Weight: 72.6 kg (160 lb) BMI (Calculated): 32.3   Physical Exam:  Constitutional: alert, cooperative and no distress  Cardiovascular: irregular rate and sinus rhythm  Gastrointestinal: soft, non-tender, and non-distended  Labs:  CBC Latest Ref Rng & Units 12/31/2017 12/30/2017 12/29/2017  WBC 3.6 - 11.0 K/uL 15.7(H) 16.3(H) 15.2(H)  Hemoglobin 12.0 - 16.0 g/dL 10.7(L) 10.7(L) 10.6(L)  Hematocrit 35.0 - 47.0 % 30.8(L) 31.5(L) 31.4(L)  Platelets 150 - 440 K/uL 288 263 248   CMP Latest Ref Rng & Units 12/31/2017 12/30/2017 12/30/2017  Glucose 70 - 99 mg/dL 186(H) - 132(H)  BUN 8 - 23 mg/dL 11 - 11  Creatinine 0.44 - 1.00 mg/dL 0.50 - 0.56  Sodium 135 - 145 mmol/L 133(L) 134(L) 135  Potassium 3.5 - 5.1 mmol/L 3.3(L) 3.3(L) 3.3(L)  Chloride 98 - 111 mmol/L 98 - 101  CO2 22 - 32 mmol/L 29 - 28  Calcium 8.9 - 10.3 mg/dL 7.3(L) - 7.6(L)  Total Protein 6.5 - 8.1 g/dL 4.9(L) - -  Total Bilirubin 0.3 - 1.2 mg/dL 0.4 - -  Alkaline Phos 38 - 126 U/L 47 - -  AST 15 - 41 U/L 12(L) - -  ALT 0 - 44 U/L 8 - -   EXAM: CT ABDOMEN AND PELVIS WITH CONTRAST  TECHNIQUE: Multidetector CT imaging of the abdomen and pelvis was performed using the standard protocol following bolus administration of intravenous contrast.  CONTRAST:  138mL ISOVUE-300 IOPAMIDOL (ISOVUE-300) INJECTION 61%  COMPARISON:  CT scan dated 12/27/2017,  12/17/2011 and 02/17/2005  FINDINGS: Lower chest: There is a new moderate right pleural effusion and a new small left pleural effusion with compressive atelectasis in both lower lobes. Chronic mild cardiomegaly. Aortic atherosclerosis. Moderate chronic hiatal hernia.  Hepatobiliary: No focal liver abnormality is seen. Status post cholecystectomy. No biliary dilatation.  Pancreas: Unremarkable. No pancreatic ductal dilatation or surrounding inflammatory changes.  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  Adrenals/Urinary Tract: Adrenal glands and kidneys and ureters appear normal. Foley catheter in the bladder. Moderate air in the bladder which may be due to the Foley catheter.  Stomach/Bowel: Since the prior study the patient has developed increased distention of the jejunum. Ileum appears normal. Multiple small interloop abscesses present in the left lower quadrant extending into the pelvis to the right and left of midline with air and pus and enhancement of the rims of these fluid collections.  There is extensive diverticulosis of the colon with diverticula throughout the colon but most extensive in the descending and proximal sigmoid portions.  There is a small pericolonic abscess anterior to the descending colon seen on image 66 of series 2. This could represent since the original site of perforation.  No appreciable free air  in the abdomen.  There is abnormal hyperemia of multiple jejunal bowel loops in the left mid abdomen and left lower quadrant adjacent to the interloop abscesses.  Vascular/Lymphatic: Aortic atherosclerosis. No enlarged abdominal or pelvic lymph nodes.  Reproductive: Uterus and bilateral adnexa are unremarkable. There are pus collections adjacent to the uterus and adnexa.  Other: There is a small amount of ascites in  the abdomen, slightly increased. Small amount of bilateral perinephric fluid is new since the prior study.  Musculoskeletal: Again noted is a small fluid collection in the right inguinal region which appears unchanged but probably represents a right inguinal hernia with ascites. No enhancement of the periphery of this fluid collection. No acute bone abnormality.  IMPRESSION: 1. The multiple small interloop abscesses have slightly progressed since the prior study. 2. Progressive dilatation of jejunum proximal to the inflammation in the left lower quadrant. 3. New moderate right pleural effusion and new small left pleural effusion. Secondary compressive atelectasis of the lower lobes. 4. Persistent hyperemia of the mucosa of the jejunum adjacent to the loop abscesses. 5. Again noted is the extensive diverticulosis of the colon. 6. The site of origin of the bowel perforation could be the distal descending colon or the adjacent jejunum.  Electronically Signed   By: Lorriane Shire M.D.   On: 12/30/2017 11:05  Assessment/Plan:  77 y.o. female with complicated diverticulitis and atrial fibrillation with rapid ventricular response. Physical exam is improving, no change on WBC. CT scan images reviewed and collection does not look localized enough to be drained. Still I consider patient is responding to abx therapy and will try to avoid surgical management in this admission. If collection develops the abscess, patient will benefit of percutaneous drainage. Agree with TPN. Patient continue on cardizem drip. Agree with critical care management.   Arnold Long, MD

## 2018-01-01 LAB — CULTURE, BLOOD (ROUTINE X 2)
Culture: NO GROWTH
Culture: NO GROWTH
SPECIAL REQUESTS: ADEQUATE
Special Requests: ADEQUATE

## 2018-01-01 LAB — GLUCOSE, CAPILLARY
Glucose-Capillary: 183 mg/dL — ABNORMAL HIGH (ref 70–99)
Glucose-Capillary: 203 mg/dL — ABNORMAL HIGH (ref 70–99)
Glucose-Capillary: 142 mg/dL — ABNORMAL HIGH (ref 70–99)
Glucose-Capillary: 142 mg/dL — ABNORMAL HIGH (ref 70–99)

## 2018-01-01 LAB — CBC WITH DIFFERENTIAL/PLATELET
BASOS ABS: 0.1 10*3/uL (ref 0–0.1)
BASOS PCT: 1 %
EOS ABS: 0 10*3/uL (ref 0–0.7)
Eosinophils Relative: 0 %
HEMATOCRIT: 34 % — AB (ref 35.0–47.0)
HEMOGLOBIN: 11.6 g/dL — AB (ref 12.0–16.0)
Lymphocytes Relative: 1 %
Lymphs Abs: 0.2 10*3/uL — ABNORMAL LOW (ref 1.0–3.6)
MCH: 27.7 pg (ref 26.0–34.0)
MCHC: 34.2 g/dL (ref 32.0–36.0)
MCV: 80.9 fL (ref 80.0–100.0)
Monocytes Absolute: 0.9 10*3/uL (ref 0.2–0.9)
Monocytes Relative: 5 %
NEUTROS ABS: 16 10*3/uL — AB (ref 1.4–6.5)
NEUTROS PCT: 93 %
Platelets: 323 10*3/uL (ref 150–440)
RBC: 4.2 MIL/uL (ref 3.80–5.20)
RDW: 16.3 % — ABNORMAL HIGH (ref 11.5–14.5)
WBC: 17.2 10*3/uL — ABNORMAL HIGH (ref 3.6–11.0)

## 2018-01-01 LAB — COMPREHENSIVE METABOLIC PANEL
ALBUMIN: 2.2 g/dL — AB (ref 3.5–5.0)
ALK PHOS: 51 U/L (ref 38–126)
ALT: 11 U/L (ref 0–44)
ANION GAP: 7 (ref 5–15)
AST: 18 U/L (ref 15–41)
BILIRUBIN TOTAL: 0.5 mg/dL (ref 0.3–1.2)
BUN: 12 mg/dL (ref 8–23)
CALCIUM: 7.6 mg/dL — AB (ref 8.9–10.3)
CO2: 30 mmol/L (ref 22–32)
Chloride: 95 mmol/L — ABNORMAL LOW (ref 98–111)
Creatinine, Ser: 0.55 mg/dL (ref 0.44–1.00)
GFR calc Af Amer: >60 mL/min (ref >60)
GFR calc non Af Amer: >60 mL/min (ref >60)
GLUCOSE: 197 mg/dL — AB (ref 70–99)
Potassium: 3.2 mmol/L — ABNORMAL LOW (ref 3.5–5.1)
SODIUM: 132 mmol/L — AB (ref 135–145)
TOTAL PROTEIN: 5 g/dL — AB (ref 6.5–8.1)

## 2018-01-01 LAB — MAGNESIUM: Magnesium: 2 mg/dL (ref 1.7–2.4)

## 2018-01-01 LAB — PHOSPHORUS: Phosphorus: 1.9 mg/dL — ABNORMAL LOW (ref 2.5–4.6)

## 2018-01-01 MED ORDER — PROMETHAZINE HCL 25 MG/ML IJ SOLN
25.0000 mg | Freq: Once | INTRAMUSCULAR | Status: AC
  Administered 2018-01-01: 25 mg via INTRAVENOUS
  Filled 2018-01-01: qty 1

## 2018-01-01 MED ORDER — DEXTROSE 5 % IV SOLN
20.0000 mmol | Freq: Once | INTRAVENOUS | Status: AC
  Administered 2018-01-01: 20 mmol via INTRAVENOUS
  Filled 2018-01-01: qty 6.67

## 2018-01-01 MED ORDER — TRACE MINERALS CR-CU-MN-SE-ZN 10-1000-500-60 MCG/ML IV SOLN
INTRAVENOUS | Status: AC
  Administered 2018-01-01: 18:00:00 via INTRAVENOUS
  Filled 2018-01-01: qty 996

## 2018-01-01 NOTE — Progress Notes (Signed)
CRITICAL CARE NOTE  CC  follow up perforated bowel secondary to diverticulitis and chronic atrial fibrillation with RVR  SUBJECTIVE Patient remains critically ill Prognosis is guarded  77 year old female who was admitted on 7/23 after presenting to the hospital for evaluation of abdominal pain. On CT scan patient noted to have diffuse inflammation of the distal jejunum extending to the descending/sigmoid colon with perforation. Patient also found to be in afib with RVR. Patient has been medically managed with close follow up by surgery.   SIGNIFICANT EVENTS No significant changes noted overnight. Patient complaining of some mild shortness of breath and less lethargic than yesterday, repeat abdominal CT scan performed. See report  BP (!) 129/118   Pulse (!) 123   Temp 98.4 F (36.9 C) (Oral)   Resp (!) 31   Ht 4\' 11"  (1.499 m)   Wt 160 lb (72.6 kg)   SpO2 96%   BMI 32.32 kg/m    REVIEW OF SYSTEMS  PATIENT IS UNABLE TO PROVIDE COMPLETE REVIEW OF SYSTEM S DUE TO SEVERE CRITICAL ILLNESS AND ENCEPHALOPATHY   PHYSICAL EXAMINATION:  GENERAL:critically ill appearing, -resp distress HEAD: Normocephalic, atraumatic.  EYES: Pupils equal, round, reactive to light.  No scleral icterus.  MOUTH: Moist mucosal membrane. NECK: Supple. No thyromegaly. No nodules. No JVD.  PULMONARY: +rhonchi, -wheezing, +accessory muscle use CARDIOVASCULAR: S1 and S2. Irregular rate and rhythm, tachycardic. No murmurs, rubs, or gallops. 2+ distal pulses bilaterally  GASTROINTESTINAL: Tenderness to palpation in the LLQ>RLQ, +guarding. Mild distention. No rebound tenderness. Absent bowel sounds. No hepatosplenomegaly.  MUSCULOSKELETAL: No swelling, clubbing, or edema.  NEUROLOGIC: alert and oriented x 3 SKIN:intact,warm,dry  ASSESSMENT AND PLAN SYNOPSIS  77 year old female with perforated bowel secondary to diverticulitis with atrial fibrillation. Being followed conservatively right now per surgery.  Repeat abdominal CT noted. Not walled off enough for percutaneous drain, PICC line was placed and started on TPN  Atrial fibrillation. Ventricular responses improved this morning. Presently on amiodarone, Cardizem, digoxin with as needed beta blockade. Appreciate cardiology input and assistance with management. We'll try to convert Cardizem to by mouth and stop diltiazem drip  Hypokalemia. On replacement  Hyperglycemia. On scale coverage  Leukocytosis at 15.7. Presently on Levaquin and Flagyl  Anemia. No evidence of active bleeding    DVT: Lovenox GI: Pepcid  Overall, patient is critically ill, prognosis is guarded.  Patient with Multiorgan failure and at high risk for cardiac arrest and death.   Hermelinda Dellen, DO    Patient ID: Jody [name], female   DOB: [birth year], 77 y.o.   MRN: [id number] Patient ID: PAIDEN CARAVEO, female   DOB: [birth year], 77 y.o.   MRN: [id number]

## 2018-01-01 NOTE — Plan of Care (Deleted)
Patient remains on vent.  Oozing continues from bilateral arms.  Not able to tolerate much movement.  Family at bedside. CRRT continues. Clotted x 1. Restarted.  Remains on pressors for blood pressure.  Will continue to monitor.

## 2018-01-01 NOTE — Progress Notes (Signed)
Pt has remained alert with orientation to self and place only. Pt has remained somnolent. Pt has remained in A fib on cardiac monitor rate from low 100s-130s non-sustained. Pt remains on Cardizem and Amnio. Pt has remained on 2LNC, lung sounds clear to auscultation, NDN. Pt with adequate UO, amber. Husband has remained supportative at bedside. Pt has c/o N/V for most of day. Emesis x 1. Zofran and once Phrenagen given with some relief. Abdomen distended and firm in the middle. Pt has hypo bowel sounds. K & P replaced today.

## 2018-01-01 NOTE — Progress Notes (Signed)
Patient ID: ASHLEYMARIE GRANDERSON, female   DOB: March 05, 1941, 77 y.o.   MRN: [id number]     [place] Hospital Day(s): 5.   Post op day(s):  Marland Kitchen   Interval History: Patient seen and examined, no acute events or new complaints overnight. Patient reports feeling "OK", denies nausea and vomiting. Husband bedside does not express any new complain. .  Vital signs in last 24 hours: [min-max] current  Temp:  [97.2 F (36.2 C)-98.4 F (36.9 C)] 98.4 F (36.9 C) (07/28 0200) Pulse Rate:  [98-132] 123 (07/28 0700) Resp:  [23-38] 31 (07/28 0700) BP: (99-146)/(64-118) 129/118 (07/28 0700) SpO2:  [94 %-96 %] 96 % (07/28 0700)     Height: 4\' 11"  (149.9 cm) Weight: 72.6 kg (160 lb) BMI (Calculated): 32.3   Physical Exam:  Constitutional: alert, cooperative and no distress  Respiratory: breathing non-labored at rest  Cardiovascular: irregular rate and rhythm.  Gastrointestinal: soft, mild-tender, and non-distended  Labs:  CBC Latest Ref Rng & Units 01/01/2018 12/31/2017 12/30/2017  WBC 3.6 - 11.0 K/uL 17.2(H) 15.7(H) 16.3(H)  Hemoglobin 12.0 - 16.0 g/dL 11.6(L) 10.7(L) 10.7(L)  Hematocrit 35.0 - 47.0 % 34.0(L) 30.8(L) 31.5(L)  Platelets 150 - 440 K/uL 323 288 263   CMP Latest Ref Rng & Units 01/01/2018 12/31/2017 12/30/2017  Glucose 70 - 99 mg/dL 197(H) 186(H) -  BUN 8 - 23 mg/dL 12 11 -  Creatinine 0.44 - 1.00 mg/dL 0.55 0.50 -  Sodium 135 - 145 mmol/L 132(L) 133(L) 134(L)  Potassium 3.5 - 5.1 mmol/L 3.2(L) 3.3(L) 3.3(L)  Chloride 98 - 111 mmol/L 95(L) 98 -  CO2 22 - 32 mmol/L 30 29 -  Calcium 8.9 - 10.3 mg/dL 7.6(L) 7.3(L) -  Total Protein 6.5 - 8.1 g/dL 5.0(L) 4.9(L) -  Total Bilirubin 0.3 - 1.2 mg/dL 0.5 0.4 -  Alkaline Phos 38 - 126 U/L 51 47 -  AST 15 - 41 U/L 18 12(L) -  ALT 0 - 44 U/L 11 8 -   Imaging studies: No new pertinent imaging studies   Assessment/Plan:  77 y.o. female with complicated diverticulitis and atrial fibrillation with rapid ventricular response. Physical exam  is improving but there is no improving on WBC. CT scan images reviewed and collection does not look localized enough to be drained. Repeat CT scan 4-5 days later may show if abscess has developed to be able to drain percutaneously. Will try to avoid surgical management in this admission since patient is a very high risk for surgery. Currently on multiple anti arrhythmic medication and not able to be controlled . Agree with TPN. Agree with critical care management.     Arnold Long, MD

## 2018-01-01 NOTE — Plan of Care (Signed)
Patient rested this shift with husband at bedside.  Morphine given x 2 for abdominal pain.  Metoprolol given x 1 for elevated heart rate.  Patient had emesis x 2, yellow-green.  Resting comfortably at this time.  Husband at bedside. Remains on 2L .  Will continue to monitor.

## 2018-01-01 NOTE — Progress Notes (Signed)
PHARMACY - ADULT TOTAL PARENTERAL NUTRITION CONSULT NOTE   Pharmacy Consult for TPN Management  Indication: colon perforation, NPO Status   Patient Measurements: Height: 4\' 11"  (149.9 cm) Weight: 160 lb (72.6 kg) IBW/kg (Calculated) : 43.2 TPN AdjBW (KG): 50.5 Body mass index is 32.32 kg/m.   Assessment:   Pharmacy consulted for TPN management for 77 yo female with diffuse inflammation on distal jejunum extending to sigmoid colon with perforation. Patient has been NPO since Sunday. Patient is NPO and at risk for refeeding.  TPN Access: PICC Triple Lumen  TPN start date: 12/30/17 Nutritional Goals (per RD recommendation on 7/26): KCal: 1500-1700 kcal  Protein: 87-102 grams  Fluid: > 1.5 L Goal TPN rate is 83 ml/hr (provides 1414 kcal and 100 grams of protein)  Current Nutrition:   Plan:  Clinimix E 5/15 at 64mL/hr.  Hold 20% lipid emulsion for first 7 days for ICU patients per ASPEN guidelines (Start date 8/2)  Add MVI, trace elements to TPN Q6hr SSI and adjust as needed  Will give KPhos 20 mmol IV x 1.  Will need continued IV replacement for goal potassium ~ 4, goal magnesium ~ 2, and goal phosphorus 2.5-3.   MIVF: LR at 38mL/hr.   Monitor TPN labs, Daily x 3 days and per protocol   Chandlar Staebell D 01/01/2018,8:40 AM

## 2018-01-02 LAB — CBC WITH DIFFERENTIAL/PLATELET
Basophils Absolute: 0.1 10*3/uL (ref 0–0.1)
Basophils Relative: 0 %
EOS PCT: 0 %
Eosinophils Absolute: 0.1 10*3/uL (ref 0–0.7)
HCT: 31.1 % — ABNORMAL LOW (ref 35.0–47.0)
Hemoglobin: 10.7 g/dL — ABNORMAL LOW (ref 12.0–16.0)
LYMPHS ABS: 0.3 10*3/uL — AB (ref 1.0–3.6)
LYMPHS PCT: 2 %
MCH: 27.9 pg (ref 26.0–34.0)
MCHC: 34.3 g/dL (ref 32.0–36.0)
MCV: 81.4 fL (ref 80.0–100.0)
MONO ABS: 0.7 10*3/uL (ref 0.2–0.9)
MONOS PCT: 5 %
Neutro Abs: 12.8 10*3/uL — ABNORMAL HIGH (ref 1.4–6.5)
Neutrophils Relative %: 93 %
PLATELETS: 274 10*3/uL (ref 150–440)
RBC: 3.82 MIL/uL (ref 3.80–5.20)
RDW: 16.1 % — AB (ref 11.5–14.5)
WBC: 14 10*3/uL — ABNORMAL HIGH (ref 3.6–11.0)

## 2018-01-02 LAB — COMPREHENSIVE METABOLIC PANEL
ALK PHOS: 50 U/L (ref 38–126)
ALT: 10 U/L (ref 0–44)
ANION GAP: 6 (ref 5–15)
AST: 23 U/L (ref 15–41)
Albumin: 2 g/dL — ABNORMAL LOW (ref 3.5–5.0)
BILIRUBIN TOTAL: 0.3 mg/dL (ref 0.3–1.2)
BUN: 15 mg/dL (ref 8–23)
CALCIUM: 7.6 mg/dL — AB (ref 8.9–10.3)
CO2: 32 mmol/L (ref 22–32)
Chloride: 98 mmol/L (ref 98–111)
Creatinine, Ser: 0.67 mg/dL (ref 0.44–1.00)
GFR calc Af Amer: >60 mL/min (ref >60)
Glucose, Bld: 174 mg/dL — ABNORMAL HIGH (ref 70–99)
Potassium: 3.6 mmol/L (ref 3.5–5.1)
Sodium: 136 mmol/L (ref 135–145)
TOTAL PROTEIN: 4.6 g/dL — AB (ref 6.5–8.1)

## 2018-01-02 LAB — GLUCOSE, CAPILLARY
GLUCOSE-CAPILLARY: 183 mg/dL — AB (ref 70–99)
GLUCOSE-CAPILLARY: 207 mg/dL — AB (ref 70–99)
GLUCOSE-CAPILLARY: 214 mg/dL — AB (ref 70–99)
Glucose-Capillary: 154 mg/dL — ABNORMAL HIGH (ref 70–99)
Glucose-Capillary: 174 mg/dL — ABNORMAL HIGH (ref 70–99)
Glucose-Capillary: 110 mg/dL — ABNORMAL HIGH (ref 70–99)

## 2018-01-02 LAB — MAGNESIUM: Magnesium: 1.8 mg/dL (ref 1.7–2.4)

## 2018-01-02 LAB — PHOSPHORUS: PHOSPHORUS: 2.6 mg/dL (ref 2.5–4.6)

## 2018-01-02 LAB — HEPARIN LEVEL (UNFRACTIONATED): Heparin Unfractionated: 1.11 IU/mL — ABNORMAL HIGH (ref 0.30–0.70)

## 2018-01-02 LAB — TRIGLYCERIDES: Triglycerides: 70 mg/dL (ref <150)

## 2018-01-02 MED ORDER — POTASSIUM CHLORIDE 10 MEQ/50ML IV SOLN
10.0000 meq | INTRAVENOUS | Status: AC
  Administered 2018-01-02 (×2): 10 meq via INTRAVENOUS
  Filled 2018-01-02 (×3): qty 50

## 2018-01-02 MED ORDER — INSULIN ASPART 100 UNIT/ML ~~LOC~~ SOLN
0.0000 [IU] | SUBCUTANEOUS | Status: DC
  Administered 2018-01-02: 2 [IU] via SUBCUTANEOUS
  Administered 2018-01-02: 3 [IU] via SUBCUTANEOUS
  Administered 2018-01-02: 2 [IU] via SUBCUTANEOUS
  Administered 2018-01-03 (×3): 3 [IU] via SUBCUTANEOUS
  Filled 2018-01-02 (×3): qty 1

## 2018-01-02 MED ORDER — MAGNESIUM SULFATE IN D5W 1-5 GM/100ML-% IV SOLN
1.0000 g | Freq: Once | INTRAVENOUS | Status: AC
  Administered 2018-01-02: 1 g via INTRAVENOUS
  Filled 2018-01-02: qty 100

## 2018-01-02 MED ORDER — TRACE MINERALS CR-CU-MN-SE-ZN 10-1000-500-60 MCG/ML IV SOLN
INTRAVENOUS | Status: AC
  Administered 2018-01-02: 18:00:00 via INTRAVENOUS
  Filled 2018-01-02: qty 1992

## 2018-01-02 MED ORDER — HEPARIN (PORCINE) IN NACL 100-0.45 UNIT/ML-% IJ SOLN
950.0000 [IU]/h | INTRAMUSCULAR | Status: DC
  Administered 2018-01-02: 950 [IU]/h via INTRAVENOUS
  Filled 2018-01-02: qty 250

## 2018-01-02 MED ORDER — HEPARIN BOLUS VIA INFUSION
2000.0000 [IU] | Freq: Once | INTRAVENOUS | Status: AC
  Administered 2018-01-02: 2000 [IU] via INTRAVENOUS
  Filled 2018-01-02: qty 2000

## 2018-01-02 MED ORDER — HEPARIN (PORCINE) IN NACL 100-0.45 UNIT/ML-% IJ SOLN
1300.0000 [IU]/h | INTRAMUSCULAR | Status: AC
  Administered 2018-01-02: 750 [IU]/h via INTRAVENOUS
  Administered 2018-01-03: 900 [IU]/h via INTRAVENOUS
  Administered 2018-01-04: 1200 [IU]/h via INTRAVENOUS
  Administered 2018-01-05: 1300 [IU]/h via INTRAVENOUS
  Filled 2018-01-02 (×3): qty 250

## 2018-01-02 NOTE — Progress Notes (Signed)
[facility] for heparin drip management  Indication: atrial fibrillation  Allergies  Allergen Reactions  . Poison Oak Extract Other (See Comments)    blistering  . Ampicillin Rash  . Penicillin V Potassium Rash    Has patient had a PCN reaction causing immediate rash, facial/tongue/throat swelling, SOB or lightheadedness with hypotension: No Has patient had a PCN reaction causing severe rash involving mucus membranes or skin necrosis: No Has patient had a PCN reaction that required hospitalization: No Has patient had a PCN reaction occurring within the last 10 years: No If all of the above answers are "NO", then may proceed with Cephalosporin use.     Patient Measurements: Height: 4\' 11"  (149.9 cm) Weight: 160 lb (72.6 kg) IBW/kg (Calculated) : 43.2 Heparin Dosing Weight: 60kg  Vital Signs: Temp: 98.2 F (36.8 C) (07/29 1600) Temp Source: Oral (07/29 1600) BP: 130/63 (07/29 1600) Pulse Rate: 104 (07/29 1600)  Labs: Recent Labs    12/31/17 0522 01/01/18 0531 01/02/18 0435  HGB 10.7* 11.6* 10.7*  HCT 30.8* 34.0* 31.1*  PLT 288 323 274  CREATININE 0.50 0.55 0.67    Estimated Creatinine Clearance: 49.5 mL/min (by C-G formula based on SCr of 0.67 mg/dL).   Medications:  Scheduled:  . chlorhexidine  15 mL Mouth Rinse BID  . digoxin  0.125 mg Intravenous Daily  . insulin aspart  0-9 Units Subcutaneous Q4H  . mouth rinse  15 mL Mouth Rinse q12n4p  . metoprolol tartrate  5 mg Intravenous Q6H  . sodium chloride flush  10-40 mL Intracatheter Q12H   Infusions:  . Marland KitchenTPN (CLINIMIX-E) Adult 41.5 mL/hr at 01/01/18 1823  . Marland KitchenTPN (CLINIMIX-E) Adult    . amiodarone 30 mg/hr (01/02/18 1608)  . diltiazem (CARDIZEM) infusion 10 mg/hr (01/02/18 1608)  . famotidine (PEPCID) IV Stopped (01/02/18 0945)  . heparin 950 Units/hr (01/02/18 1126)  . levofloxacin (LEVAQUIN) IV Stopped (01/02/18 1046)  . metronidazole Stopped (01/02/18 1407)  . sodium  chloride      Assessment: Pharmacy consulted for heparin drip management for 77 yo female admitted to ICU with perforated bowel. Patient with chronic atrial fibrillation on apixaban as an outpatient. Patient has not been ordered apixaban this admission. Patient ordered enoxaparin 40mg  SQ Daily with last dose at 0915. Patient CHA2DS2/VAS of at least 4 (Age, Gender, HTN).   Goal of Therapy:  Heparin level 0.3-0.7 units/ml Monitor platelets by anticoagulation protocol: Yes   Plan:  Will initiate heparin bolus of 2000 units and continuous rate of 950 units/hr. Will obtain heparin level at 1930. Will obtain CBC with am labs.   Pharmacy will continue to monitor and adjust per consult.   Kalianne Fetting L 01/02/2018,5:00 PM

## 2018-01-02 NOTE — Plan of Care (Signed)
Pt has been more alert this shift and less lethargic, however did get a little more confused towards the end of the shift. Had one episode of N/V, with relief from zofran x1. Has maintained adequate UO, amber in color. TPN, diltiazem and amiodarone continue to infuse. Husband has remained at bedside today, with multiple other visitors coming today.

## 2018-01-02 NOTE — Progress Notes (Signed)
CRITICAL CARE NOTE  CC  follow up perforated bowel secondary to diverticulitis and chronic a fib with RVR  SUBJECTIVE Patient remains critically ill Prognosis is guarded  77 year old female who was admitted on 7/23 for perforated bowel secondary to diverticulitis. CT scan on Friday 7/26 showed progressive dilation of jejunum with abscesses. Patient continues to be medically managed and closely followed by surgery.    SIGNIFICANT EVENTS  Patient with one episode of vomiting yesterday. Continues to be somnolent with A fib yesterday. Overnight patient did well.   BP 109/73 (BP Location: Left Arm)   Pulse (!) 107   Temp 98.1 F (36.7 C) (Oral)   Resp (!) 23   Ht 4\' 11"  (1.499 m)   Wt 72.6 kg (160 lb)   SpO2 95%   BMI 32.32 kg/m    REVIEW OF SYSTEMS  PATIENT IS UNABLE TO PROVIDE COMPLETE REVIEW OF SYSTEM S DUE TO SEVERE CRITICAL ILLNESS AND ENCEPHALOPATHY   PHYSICAL EXAMINATION:  GENERAL:critically ill appearing, -resp distress, sleeping comfortably in bed HEAD: Normocephalic, atraumatic.  EYES: Pupils equal, round, reactive to light.  No scleral icterus.  MOUTH: Moist mucosal membrane. NECK: Supple. No thyromegaly. No nodules. No JVD.  PULMONARY: Clear to auscultation bilaterally CARDIOVASCULAR: S1 and S2. Irregular rate and rhythm No murmurs, rubs, or gallops. 1+ dorsalis pedis pulses bilaterally GASTROINTESTINAL: moderate TTP in LLQ> RLQ, diminished bowel sounds, mild distention of abdomen. No hepatosplenomegaly MUSCULOSKELETAL: No swelling, clubbing, or edema.  NEUROLOGIC: alert and oriented x 2 SKIN:intact,warm,dry  ASSESSMENT AND PLAN SYNOPSIS  77 year old female with perforated bowel secondary to diverticulitis with chronic atrial fibrillation. Continues to be medically managed by surgery as patient is high risk for surgical intervention. Abdominal CT on 7/29 showed progressive dilation of jejunum with multiple abscesses formed. Recommend repeat CT in 4-5 days from  Friday 7/26.   Over weekend, PICC placed and patient started on TPN.  Patient is status DO NOT RESUSCITATE/DO NOT INTUBATE   GASTROINTESTINAL: -perforated bowel secondary to diverticulitis -surgery closely following: continue medical management -TPN -1 episode of vomiting yesterday: PRN zofran for nausea, phenergan -repeat CT tomorrow or Wednesday to assess for possibility of percutaneous drainage  PULMONARY -respiratory distress secondary to abdominal pain -improving, patient on 2L of O2 nasal cannula -continue bronchodilators, incentive spirometry  AKI with Lactic acidosis: -resolved -creatinine 0.67 -phosphate and potassium replaced  CARDIAC -chronic atrial fibrillation, rate improved this morning -amiodarone drip, diltiazem drip, metoprolol IV, and digoxin  -convert to PO diltiazem when able -hold eliquis  NEUROLOGY -acute encephalopathy secondary to abdominal infection -continues to be lethargic   ID -levaquin and flagyl for intra abdominal infection -blood cultures with no growth  ENDOCRINE -SSI for hyperglycemia   DVT: lovenox GI: pepcid  TRANSFUSIONS AS NEEDED MONITOR FSBS ASSESS the need for LABS as needed   Critical Care Time devoted to patient care services described in this note is 28 minutes.   Overall, patient is critically ill, prognosis is guarded.  Patient with Multiorgan failure and at high risk for cardiac arrest and death.

## 2018-01-02 NOTE — Progress Notes (Addendum)
PHARMACY - ADULT TOTAL PARENTERAL NUTRITION CONSULT NOTE   Pharmacy Consult for TPN Management  Indication: colon perforation, NPO Status   Patient Measurements: Height: 4\' 11"  (149.9 cm) Weight: 160 lb (72.6 kg) IBW/kg (Calculated) : 43.2 TPN AdjBW (KG): 50.5 Body mass index is 32.32 kg/m.   Assessment:   Pharmacy consulted for TPN management for 77 yo female with diffuse inflammation on distal jejunum extending to sigmoid colon with perforation. Patient has been NPO since Sunday. Patient is NPO and at risk for refeeding.  TPN Access: PICC Triple Lumen  TPN start date: 12/30/17 Nutritional Goals (per RD recommendation on 7/26): KCal: 1500-1700 kcal  Protein: 87-102 grams  Fluid: > 1.5 L Goal TPN rate is 83 ml/hr (provides 1414 kcal and 100 grams of protein)  Current Nutrition:   Plan:  Clinimix E 5/15 at 31mL/hr on 7/29.   Hold 20% lipid emulsion for first 7 days for ICU patients per ASPEN guidelines (Start date 8/2)  Add MVI, trace elements to TPN Q6hr SSI and adjust as needed  With advancing TPN to goal will conservatively replace potassium 39mEq IV Q1hr x 2 hours and magnesium 1g IV x 1.  Will need continued IV replacement for goal potassium ~ 4, goal magnesium ~ 2, and goal phosphorus 2.5-3.   Monitor TPN labs. Blood Glucose.   Pharmacy will continue to monitor and adjust per consult.   Simpson,Michael L 01/02/2018,5:12 PM

## 2018-01-02 NOTE — Progress Notes (Signed)
[place] for heparin drip management  Indication: atrial fibrillation  Allergies  Allergen Reactions  . Poison Oak Extract Other (See Comments)    blistering  . Ampicillin Rash  . Penicillin V Potassium Rash    Has patient had a PCN reaction causing immediate rash, facial/tongue/throat swelling, SOB or lightheadedness with hypotension: No Has patient had a PCN reaction causing severe rash involving mucus membranes or skin necrosis: No Has patient had a PCN reaction that required hospitalization: No Has patient had a PCN reaction occurring within the last 10 years: No If all of the above answers are "NO", then may proceed with Cephalosporin use.     Patient Measurements: Height: 4\' 11"  (149.9 cm) Weight: 160 lb (72.6 kg) IBW/kg (Calculated) : 43.2 Heparin Dosing Weight: 60kg  Vital Signs: Temp: 98.2 F (36.8 C) (07/29 2000) Temp Source: Oral (07/29 2000) BP: 150/82 (07/29 2000) Pulse Rate: 119 (07/29 2107)  Labs: Recent Labs    12/31/17 0522 01/01/18 0531 01/02/18 0435 01/02/18 2035  HGB 10.7* 11.6* 10.7*  --   HCT 30.8* 34.0* 31.1*  --   PLT 288 323 274  --   HEPARINUNFRC  --   --   --  1.11*  CREATININE 0.50 0.55 0.67  --     Estimated Creatinine Clearance: 49.5 mL/min (by C-G formula based on SCr of 0.67 mg/dL).   Medications:  Scheduled:  . chlorhexidine  15 mL Mouth Rinse BID  . digoxin  0.125 mg Intravenous Daily  . insulin aspart  0-9 Units Subcutaneous Q4H  . mouth rinse  15 mL Mouth Rinse q12n4p  . metoprolol tartrate  5 mg Intravenous Q6H  . sodium chloride flush  10-40 mL Intracatheter Q12H   Infusions:  . Marland KitchenTPN (CLINIMIX-E) Adult 83 mL/hr at 01/02/18 2000  . amiodarone 30 mg/hr (01/02/18 2000)  . diltiazem (CARDIZEM) infusion 15 mg/hr (01/02/18 2107)  . famotidine (PEPCID) IV Stopped (01/02/18 0945)  . levofloxacin (LEVAQUIN) IV Stopped (01/02/18 1046)  . metronidazole 500 mg (01/02/18 2105)  . sodium  chloride      Assessment: Pharmacy consulted for heparin drip management for 77 yo female admitted to ICU with perforated bowel. Patient with chronic atrial fibrillation on apixaban as an outpatient. Patient has not been ordered apixaban this admission. Patient ordered enoxaparin 40mg  SQ Daily with last dose at 0915. Patient CHA2DS2/VAS of at least 4 (Age, Gender, HTN).   Goal of Therapy:  Heparin level 0.3-0.7 units/ml Monitor platelets by anticoagulation protocol: Yes   Plan:  Will initiate heparin bolus of 2000 units and continuous rate of 950 units/hr. Will obtain heparin level at 1930. Will obtain CBC with am labs.   7/29:  HL @ 20:30 = 1.11 .   Called RN,  RN states heparin gtt running thru PICC line and HL was draw from peripheral line, therefore HL should be valid.  Will hold HL for 1 hr and restart on 7/29 @ 23:15 @ 750 units/hr.   Will recheck HL 8 hrs after restart on 7/30 @ 0700.    Pharmacy will continue to monitor and adjust per consult.   Jody [name] D 01/02/2018,10:13 PM

## 2018-01-03 ENCOUNTER — Inpatient Hospital Stay: Payer: Medicare Other

## 2018-01-03 DIAGNOSIS — I480 Paroxysmal atrial fibrillation: Secondary | ICD-10-CM

## 2018-01-03 LAB — GLUCOSE, CAPILLARY
GLUCOSE-CAPILLARY: 212 mg/dL — AB (ref 70–99)
GLUCOSE-CAPILLARY: 226 mg/dL — AB (ref 70–99)
GLUCOSE-CAPILLARY: 249 mg/dL — AB (ref 70–99)
Glucose-Capillary: 218 mg/dL — ABNORMAL HIGH (ref 70–99)
Glucose-Capillary: 175 mg/dL — ABNORMAL HIGH (ref 70–99)
Glucose-Capillary: 215 mg/dL — ABNORMAL HIGH (ref 70–99)

## 2018-01-03 LAB — PHOSPHORUS: Phosphorus: 2.8 mg/dL (ref 2.5–4.6)

## 2018-01-03 LAB — CBC WITH DIFFERENTIAL/PLATELET
Basophils Absolute: 0 10*3/uL (ref 0–0.1)
Basophils Relative: 0 %
EOS ABS: 0 10*3/uL (ref 0–0.7)
Eosinophils Relative: 0 %
HCT: 31.5 % — ABNORMAL LOW (ref 35.0–47.0)
Hemoglobin: 10.6 g/dL — ABNORMAL LOW (ref 12.0–16.0)
LYMPHS ABS: 0.2 10*3/uL — AB (ref 1.0–3.6)
Lymphocytes Relative: 2 %
MCH: 27.5 pg (ref 26.0–34.0)
MCHC: 33.7 g/dL (ref 32.0–36.0)
MCV: 81.8 fL (ref 80.0–100.0)
MONO ABS: 0.8 10*3/uL (ref 0.2–0.9)
MONOS PCT: 5 %
NEUTROS PCT: 93 %
Neutro Abs: 15.7 10*3/uL — ABNORMAL HIGH (ref 1.4–6.5)
Platelets: 305 10*3/uL (ref 150–440)
RBC: 3.85 MIL/uL (ref 3.80–5.20)
RDW: 16.3 % — ABNORMAL HIGH (ref 11.5–14.5)
WBC: 16.8 10*3/uL — ABNORMAL HIGH (ref 3.6–11.0)

## 2018-01-03 LAB — COMPREHENSIVE METABOLIC PANEL
ALT: 11 U/L (ref 0–44)
ANION GAP: 5 (ref 5–15)
AST: 17 U/L (ref 15–41)
Albumin: 2.1 g/dL — ABNORMAL LOW (ref 3.5–5.0)
Alkaline Phosphatase: 46 U/L (ref 38–126)
BILIRUBIN TOTAL: 0.3 mg/dL (ref 0.3–1.2)
BUN: 17 mg/dL (ref 8–23)
CALCIUM: 7.7 mg/dL — AB (ref 8.9–10.3)
CO2: 33 mmol/L — AB (ref 22–32)
Chloride: 97 mmol/L — ABNORMAL LOW (ref 98–111)
Creatinine, Ser: 0.67 mg/dL (ref 0.44–1.00)
GFR calc non Af Amer: >60 mL/min (ref >60)
Glucose, Bld: 242 mg/dL — ABNORMAL HIGH (ref 70–99)
Potassium: 3.5 mmol/L (ref 3.5–5.1)
SODIUM: 135 mmol/L (ref 135–145)
TOTAL PROTEIN: 4.8 g/dL — AB (ref 6.5–8.1)

## 2018-01-03 LAB — HEPARIN LEVEL (UNFRACTIONATED)
HEPARIN UNFRACTIONATED: 0.13 [IU]/mL — AB (ref 0.30–0.70)
HEPARIN UNFRACTIONATED: 0.16 [IU]/mL — AB (ref 0.30–0.70)

## 2018-01-03 LAB — TRIGLYCERIDES: Triglycerides: 66 mg/dL (ref <150)

## 2018-01-03 LAB — MAGNESIUM: Magnesium: 2 mg/dL (ref 1.7–2.4)

## 2018-01-03 MED ORDER — TRACE MINERALS CR-CU-MN-SE-ZN 10-1000-500-60 MCG/ML IV SOLN
INTRAVENOUS | Status: AC
  Administered 2018-01-03: 18:00:00 via INTRAVENOUS
  Filled 2018-01-03: qty 1992

## 2018-01-03 MED ORDER — PROMETHAZINE HCL 25 MG/ML IJ SOLN
25.0000 mg | Freq: Four times a day (QID) | INTRAMUSCULAR | Status: DC | PRN
  Administered 2018-01-03 – 2018-01-04 (×2): 25 mg via INTRAVENOUS
  Filled 2018-01-03 (×2): qty 1

## 2018-01-03 MED ORDER — HEPARIN BOLUS VIA INFUSION
1800.0000 [IU] | Freq: Once | INTRAVENOUS | Status: AC
  Administered 2018-01-03: 1800 [IU] via INTRAVENOUS
  Filled 2018-01-03: qty 1800

## 2018-01-03 MED ORDER — PROMETHAZINE HCL 25 MG/ML IJ SOLN
12.5000 mg | Freq: Once | INTRAMUSCULAR | Status: AC
  Administered 2018-01-03: 12.5 mg via INTRAVENOUS

## 2018-01-03 MED ORDER — POTASSIUM CHLORIDE 10 MEQ/50ML IV SOLN
10.0000 meq | INTRAVENOUS | Status: AC
  Administered 2018-01-03 (×2): 10 meq via INTRAVENOUS
  Filled 2018-01-03 (×2): qty 50

## 2018-01-03 MED ORDER — PROMETHAZINE HCL 25 MG/ML IJ SOLN: 12.5000 mg | Freq: Four times a day (QID) | INTRAMUSCULAR | Status: DC | PRN

## 2018-01-03 MED ORDER — INSULIN ASPART 100 UNIT/ML ~~LOC~~ SOLN
0.0000 [IU] | SUBCUTANEOUS | Status: DC
  Administered 2018-01-03 (×2): 5 [IU] via SUBCUTANEOUS
  Administered 2018-01-03 – 2018-01-04 (×3): 3 [IU] via SUBCUTANEOUS
  Administered 2018-01-04: 5 [IU] via SUBCUTANEOUS
  Administered 2018-01-04 (×2): 3 [IU] via SUBCUTANEOUS
  Administered 2018-01-04: 5 [IU] via SUBCUTANEOUS
  Administered 2018-01-05 (×2): 3 [IU] via SUBCUTANEOUS
  Administered 2018-01-05: 8 [IU] via SUBCUTANEOUS
  Administered 2018-01-05: 3 [IU] via SUBCUTANEOUS
  Administered 2018-01-06: 11 [IU] via SUBCUTANEOUS
  Administered 2018-01-06: 3 [IU] via SUBCUTANEOUS
  Administered 2018-01-06: 8 [IU] via SUBCUTANEOUS
  Administered 2018-01-06: 11 [IU] via SUBCUTANEOUS
  Administered 2018-01-06 (×2): 5 [IU] via SUBCUTANEOUS
  Administered 2018-01-06 – 2018-01-08 (×10): 3 [IU] via SUBCUTANEOUS
  Administered 2018-01-08: 5 [IU] via SUBCUTANEOUS
  Administered 2018-01-09: 3 [IU] via SUBCUTANEOUS
  Administered 2018-01-09 (×5): 2 [IU] via SUBCUTANEOUS
  Administered 2018-01-09 – 2018-01-10 (×2): 3 [IU] via SUBCUTANEOUS
  Administered 2018-01-10: 2 [IU] via SUBCUTANEOUS
  Administered 2018-01-10 – 2018-01-11 (×4): 3 [IU] via SUBCUTANEOUS
  Administered 2018-01-11 (×2): 2 [IU] via SUBCUTANEOUS
  Administered 2018-01-11: 3 [IU] via SUBCUTANEOUS
  Administered 2018-01-11: 2 [IU] via SUBCUTANEOUS
  Administered 2018-01-11: 3 [IU] via SUBCUTANEOUS
  Administered 2018-01-12 (×2): 2 [IU] via SUBCUTANEOUS
  Administered 2018-01-12: 3 [IU] via SUBCUTANEOUS
  Administered 2018-01-12 (×2): 2 [IU] via SUBCUTANEOUS
  Administered 2018-01-13: 3 [IU] via SUBCUTANEOUS
  Administered 2018-01-13: 2 [IU] via SUBCUTANEOUS
  Administered 2018-01-13: 3 [IU] via SUBCUTANEOUS
  Administered 2018-01-13 – 2018-01-14 (×4): 2 [IU] via SUBCUTANEOUS
  Administered 2018-01-14: 3 [IU] via SUBCUTANEOUS
  Administered 2018-01-14 – 2018-01-15 (×6): 2 [IU] via SUBCUTANEOUS
  Administered 2018-01-16: 3 [IU] via SUBCUTANEOUS
  Administered 2018-01-16: 2 [IU] via SUBCUTANEOUS
  Administered 2018-01-16: 3 [IU] via SUBCUTANEOUS
  Administered 2018-01-17: 15 [IU] via SUBCUTANEOUS
  Administered 2018-01-17 (×5): 2 [IU] via SUBCUTANEOUS
  Administered 2018-01-18: 3 [IU] via SUBCUTANEOUS
  Administered 2018-01-18: 2 [IU] via SUBCUTANEOUS
  Filled 2018-01-03 (×72): qty 1

## 2018-01-03 NOTE — Progress Notes (Addendum)
Subjective:    Jody [name] is a 77 y.o. female  Hospital stay day 7,   bowel perforation  No acute issues overnight.  ROS:  A 5 point review of systems was performed and pertinent positives and negatives noted in HPI. Objective:      Temp:  [97.5 F (36.4 C)-98.5 F (36.9 C)] 97.5 F (36.4 C) (07/30 0720) Pulse Rate:  [89-125] 104 (07/30 0700) Resp:  [22-39] 39 (07/30 0700) BP: (114-150)/(60-91) 135/78 (07/30 0700) SpO2:  [93 %-95 %] 93 % (07/30 0700)     Height: 4\' 11"  (149.9 cm) Weight: 72.6 kg (160 lb) BMI (Calculated): 32.3   Intake/Output this shift:    Intake/Output last 2 shifts:  @IOLAST2SHIFTS @    Constitutional :  alert, cooperative, appears stated age and no distress  Respiratory:  clear to auscultation bilaterally  Cardiovascular:  regularly irregular rhythm  Gastrointestinal: soft, with guarding in suprapubic and RLQ region, no major change from last night.   Skin: Cool and moist.   Psychiatric: Normal affect, non-agitated, not confused       LABS:  CMP Latest Ref Rng & Units 01/03/2018 01/02/2018 01/01/2018  Glucose 70 - 99 mg/dL 242(H) 174(H) 197(H)  BUN 8 - 23 mg/dL 17 15 12   Creatinine 0.44 - 1.00 mg/dL 0.67 0.67 0.55  Sodium 135 - 145 mmol/L 135 136 132(L)  Potassium 3.5 - 5.1 mmol/L 3.5 3.6 3.2(L)  Chloride 98 - 111 mmol/L 97(L) 98 95(L)  CO2 22 - 32 mmol/L 33(H) 32 30  Calcium 8.9 - 10.3 mg/dL 7.7(L) 7.6(L) 7.6(L)  Total Protein 6.5 - 8.1 g/dL 4.8(L) 4.6(L) 5.0(L)  Total Bilirubin 0.3 - 1.2 mg/dL 0.3 0.3 0.5  Alkaline Phos 38 - 126 U/L 46 50 51  AST 15 - 41 U/L 17 23 18   ALT 0 - 44 U/L 11 10 11    CBC Latest Ref Rng & Units 01/03/2018 01/02/2018 01/01/2018  WBC 3.6 - 11.0 K/uL 16.8(H) 14.0(H) 17.2(H)  Hemoglobin 12.0 - 16.0 g/dL 10.6(L) 10.7(L) 11.6(L)  Hematocrit 35.0 - 47.0 % 31.5(L) 31.1(L) 34.0(L)  Platelets 150 - 440 K/uL 305 274 323    RADS: n/a Assessment:        Bowel perforation.  Her wbc increased again today, trend tends to  be to go up and then down every other day.  Overall numbers seems to be improving slightly.  With no significant change in exam and last CT being only four days ago, will wait for one more day prior to scanning her again.   Recommend PT to get her out of bed as tolerated.  Continue current plan otherwise per ICU team.

## 2018-01-03 NOTE — Progress Notes (Signed)
[facility] for heparin drip management  Indication: atrial fibrillation  Allergies  Allergen Reactions  . Poison Oak Extract Other (See Comments)    blistering  . Ampicillin Rash  . Penicillin V Potassium Rash    Has patient had a PCN reaction causing immediate rash, facial/tongue/throat swelling, SOB or lightheadedness with hypotension: No Has patient had a PCN reaction causing severe rash involving mucus membranes or skin necrosis: No Has patient had a PCN reaction that required hospitalization: No Has patient had a PCN reaction occurring within the last 10 years: No If all of the above answers are "NO", then may proceed with Cephalosporin use.     Patient Measurements: Height: 4\' 11"  (149.9 cm) Weight: 160 lb (72.6 kg) IBW/kg (Calculated) : 43.2 Heparin Dosing Weight: 60kg  Vital Signs: Temp: 98.2 F (36.8 C) (07/30 1200) Temp Source: Oral (07/30 1200) BP: 150/94 (07/30 1400) Pulse Rate: 115 (07/30 1400)  Labs: Recent Labs    01/01/18 0531 01/02/18 0435 01/02/18 2035 01/03/18 0421 01/03/18 0722  HGB 11.6* 10.7*  --  10.6*  --   HCT 34.0* 31.1*  --  31.5*  --   PLT 323 274  --  305  --   HEPARINUNFRC  --   --  1.11*  --  0.13*  CREATININE 0.55 0.67  --  0.67  --     Estimated Creatinine Clearance: 49.5 mL/min (by C-G formula based on SCr of 0.67 mg/dL).   Medications:  Scheduled:  . chlorhexidine  15 mL Mouth Rinse BID  . digoxin  0.125 mg Intravenous Daily  . insulin aspart  0-15 Units Subcutaneous Q4H  . mouth rinse  15 mL Mouth Rinse q12n4p  . metoprolol tartrate  5 mg Intravenous Q6H  . sodium chloride flush  10-40 mL Intracatheter Q12H   Infusions:  . Marland KitchenTPN (CLINIMIX-E) Adult 83 mL/hr at 01/03/18 1100  . Marland KitchenTPN (CLINIMIX-E) Adult    . amiodarone 30 mg/hr (01/03/18 1100)  . diltiazem (CARDIZEM) infusion 15 mg/hr (01/03/18 1100)  . famotidine (PEPCID) IV Stopped (01/03/18 1050)  . heparin 900 Units/hr (01/03/18  1359)  . levofloxacin (LEVAQUIN) IV Stopped (01/03/18 1101)  . metronidazole 500 mg (01/03/18 1357)  . sodium chloride      Assessment: Pharmacy consulted for heparin drip management for 77 yo female admitted to ICU with perforated bowel. Patient with chronic atrial fibrillation on apixaban as an outpatient. Patient has not been ordered apixaban this admission. Patient ordered enoxaparin 40mg  SQ Daily with last dose at 0915. Patient CHA2DS2/VAS of at least 4 (Age, Gender, HTN).   Heparin infusing at 750 units/hr 7/30 0700 Heparin level resulted at 0.13  Goal of Therapy:  Heparin level 0.3-0.7 units/ml Monitor platelets by anticoagulation protocol: Yes   Plan:  Will order Heparin 1800 units IV bolus and increase drip rate to 900 units/hr. Check Heparin level in 8 hours. Daily CBC while on Heparin drip.  Pharmacy will continue to monitor and adjust per consult.   Jody [name], PharmD, BCPS 01/03/2018 2:29 PM

## 2018-01-03 NOTE — Progress Notes (Signed)
Inpatient Diabetes Program Recommendations  AACE/ADA: New Consensus Statement on Inpatient Glycemic Control (2015)  Target Ranges:  Prepandial:   less than 140 mg/dL      Peak postprandial:   less than 180 mg/dL (1-2 hours)      Critically ill patients:  140 - 180 mg/dL   Results for Jody, [name] (MRN [id number]) as of 01/03/2018 12:48  Ref. Range 01/02/2018 23:17 01/03/2018 04:09 01/03/2018 07:17 01/03/2018 11:52  Glucose-Capillary Latest Ref Range: 70 - 99 mg/dL 207 (H)  3 units NOVOLOG 212 (H)  3 units NOVOLOG 218 (H)  3 units NOVOLOG 226 (H)  3 units NOVOLOG     Admit with: Perforated Bowel  History: Borderline Diabetes  Home DM Meds: None  Current Insulin Orders: Novolog Sensitive Correction Scale/ SSI (0-9 units) Q4 hours      Note that patient is receiving TPN 83cc/hour--NO Insulin in the TPN solution.  CBGs >200 mg/dl.     MD- Please consider the following in-hospital insulin adjustments:  1. Increase Novolog SSI/Correction scale to Moderate scale (0-15 units) Q4 hours  2. Pharmacy--Would you consider adding Insulin to the TPN solution?     --Will follow patient during hospitalization--  Wyn Quaker RN, MSN, CDE Diabetes Coordinator Inpatient Glycemic Control Team Team Pager: [phone] (8a-5p)

## 2018-01-03 NOTE — Progress Notes (Addendum)
Pt has had complaints of nausea today, with 8-10 episodes of vomiting. Emesis dark brown to dark green. Minimal relief from antiemetics. Pt denies any abd pain. Pt has also been intermittently restless with PRN ativan x2.

## 2018-01-03 NOTE — Progress Notes (Signed)
PHARMACY - ADULT TOTAL PARENTERAL NUTRITION CONSULT NOTE   Pharmacy Consult for TPN Management  Indication: colon perforation, NPO Status   Patient Measurements: Height: 4\' 11"  (149.9 cm) Weight: 160 lb (72.6 kg) IBW/kg (Calculated) : 43.2 TPN AdjBW (KG): 50.5 Body mass index is 32.32 kg/m.   Assessment:   Pharmacy consulted for TPN management for 77 yo female with diffuse inflammation on distal jejunum extending to sigmoid colon with perforation. Patient has been NPO since Sunday. Patient is NPO and at risk for refeeding.  TPN Access: PICC Triple Lumen  TPN start date: 12/30/17 Nutritional Goals (per RD recommendation on 7/26): KCal: 1500-1700 kcal  Protein: 87-102 grams  Fluid: > 1.5 L Goal TPN rate is 83 ml/hr (provides 1414 kcal and 100 grams of protein)  Current Nutrition:   Plan:  Clinimix E 5/15 at 37mL/hr on 7/29.   Hold 20% lipid emulsion for first 7 days for ICU patients per ASPEN guidelines (Start date 8/2)  Add MVI, trace elements to TPN  Patient required 13 units of SSI, will change to moderate scale and continue to assess. If glucose continues current trend will need to add insulin to TPN.  With advancement of TPN to goal will conservatively replace potassium 43mEq IV Q1hr x 2 hours for goal potassium ~ 4, goal magnesium ~ 2, and goal phosphorus 2.5-3.   Monitor TPN labs. Blood Glucose.   Pharmacy will continue to monitor and adjust per consult.   [name], PharmD, BCPS 01/03/2018 2:26 PM

## 2018-01-03 NOTE — Progress Notes (Addendum)
Subjective:  LATE ENTRY FOR 01/02/18  CLEONE HULICK is a 77 y.o. female  Hospital stay day 6,     Bowel perforation.  No major changes overnight  ROS:  A 5 point review of systems was performed and pertinent positives and negatives noted in HPI. Objective:    Intake/Output this shift:  No intake/output data recorded.   Intake/Output last 2 shifts:     Constitutional :  alert, cooperative, appears stated age and no distress  Respiratory:  clear to auscultation bilaterally  Cardiovascular:  regularly irregular rhythm  Gastrointestinal: soft, less tender than last week, but still guarding in suprapubic, RLQ region..   Skin: Cool and moist.   Psychiatric: Normal affect, non-agitated, not confused       LABS:  CMP Latest Ref Rng & Units 01/03/2018 01/02/2018 01/01/2018  Glucose 70 - 99 mg/dL 242(H) 174(H) 197(H)  BUN 8 - 23 mg/dL 17 15 12   Creatinine 0.44 - 1.00 mg/dL 0.67 0.67 0.55  Sodium 135 - 145 mmol/L 135 136 132(L)  Potassium 3.5 - 5.1 mmol/L 3.5 3.6 3.2(L)  Chloride 98 - 111 mmol/L 97(L) 98 95(L)  CO2 22 - 32 mmol/L 33(H) 32 30  Calcium 8.9 - 10.3 mg/dL 7.7(L) 7.6(L) 7.6(L)  Total Protein 6.5 - 8.1 g/dL 4.8(L) 4.6(L) 5.0(L)  Total Bilirubin 0.3 - 1.2 mg/dL 0.3 0.3 0.5  Alkaline Phos 38 - 126 U/L 46 50 51  AST 15 - 41 U/L 17 23 18   ALT 0 - 44 U/L 11 10 11    CBC Latest Ref Rng & Units 01/03/2018 01/02/2018 01/01/2018  WBC 3.6 - 11.0 K/uL 16.8(H) 14.0(H) 17.2(H)  Hemoglobin 12.0 - 16.0 g/dL 10.6(L) 10.7(L) 11.6(L)  Hematocrit 35.0 - 47.0 % 31.5(L) 31.1(L) 34.0(L)  Platelets 150 - 440 K/uL 305 274 323    RADS:  n/a Assessment:      Bowel perforation. Exam improvement seen over the weekend, with decreasing wbc.  Will consider CT on 7/30 if needed.  Continue present management per ICU team.

## 2018-01-03 NOTE — Progress Notes (Signed)
[place] for heparin drip management  Indication: atrial fibrillation  Allergies  Allergen Reactions  . Poison Oak Extract Other (See Comments)    blistering  . Ampicillin Rash  . Penicillin V Potassium Rash    Has patient had a PCN reaction causing immediate rash, facial/tongue/throat swelling, SOB or lightheadedness with hypotension: No Has patient had a PCN reaction causing severe rash involving mucus membranes or skin necrosis: No Has patient had a PCN reaction that required hospitalization: No Has patient had a PCN reaction occurring within the last 10 years: No If all of the above answers are "NO", then may proceed with Cephalosporin use.     Patient Measurements: Height: 4\' 11"  (149.9 cm) Weight: 160 lb (72.6 kg) IBW/kg (Calculated) : 43.2 Heparin Dosing Weight: 60kg  Vital Signs: Temp: 98.1 F (36.7 C) (07/30 1511) Temp Source: Oral (07/30 1511) BP: 136/74 (07/30 1500) Pulse Rate: 127 (07/30 1500)  Labs: Recent Labs    01/01/18 0531 01/02/18 0435 01/02/18 2035 01/03/18 0421 01/03/18 0722 01/03/18 1712  HGB 11.6* 10.7*  --  10.6*  --   --   HCT 34.0* 31.1*  --  31.5*  --   --   PLT 323 274  --  305  --   --   HEPARINUNFRC  --   --  1.11*  --  0.13* 0.16*  CREATININE 0.55 0.67  --  0.67  --   --     Estimated Creatinine Clearance: 49.5 mL/min (by C-G formula based on SCr of 0.67 mg/dL).   Medications:  Scheduled:  . chlorhexidine  15 mL Mouth Rinse BID  . digoxin  0.125 mg Intravenous Daily  . insulin aspart  0-15 Units Subcutaneous Q4H  . mouth rinse  15 mL Mouth Rinse q12n4p  . metoprolol tartrate  5 mg Intravenous Q6H  . sodium chloride flush  10-40 mL Intracatheter Q12H   Infusions:  . Marland KitchenTPN (CLINIMIX-E) Adult 83 mL/hr at 01/03/18 1100  . Marland KitchenTPN (CLINIMIX-E) Adult    . amiodarone 30 mg/hr (01/03/18 1707)  . diltiazem (CARDIZEM) infusion 15 mg/hr (01/03/18 1504)  . famotidine (PEPCID) IV Stopped (01/03/18  1050)  . heparin 900 Units/hr (01/03/18 1359)  . levofloxacin (LEVAQUIN) IV Stopped (01/03/18 1101)  . metronidazole Stopped (01/03/18 1511)  . sodium chloride      Assessment: Pharmacy consulted for heparin drip management for 77 yo female admitted to ICU with perforated bowel. Patient with chronic atrial fibrillation on apixaban as an outpatient. Patient has not been ordered apixaban this admission. Patient ordered enoxaparin 40mg  SQ Daily with last dose at 0915. Patient CHA2DS2/VAS of at least 4 (Age, Gender, HTN).   Heparin infusing at 750 units/hr 7/30 0700 Heparin level resulted at 0.13  Goal of Therapy:  Heparin level 0.3-0.7 units/ml Monitor platelets by anticoagulation protocol: Yes   Plan:  Will order Heparin 1800 units IV bolus and increase drip rate to 900 units/hr. Check Heparin level in 8 hours. Daily CBC while on Heparin drip.  01/03/18 17:47 HL subtherapeutic x 1. 1800 units Iv x bolus x 1 and increase rate to 1100 units/hr. Will recheck HL in 8 hours.  Pharmacy will continue to monitor and adjust per consult.   Uriel Horkey A. Jordan Hawks, PharmD, BCPS 01/03/2018 5:47 PM

## 2018-01-03 NOTE — Progress Notes (Signed)
CRITICAL CARE NOTE  CC  follow up perforated bowel secondary to diverticulitis and chronic atrial fibrillation  SUBJECTIVE Patient remains critically ill Prognosis is guarded  77 year old female who was admitted on 7/23 for perforated bowel secondary to diverticulitis. Patient continues to improve with medical management and is becoming more alert. Patient's husband states he feels she is doing better as well.     SIGNIFICANT EVENTS  Patient with episodes of vomiting last night and this morning. Zofran and phenergan given for nausea.   BP 135/78   Pulse (!) 104   Temp (!) 97.5 F (36.4 C) (Oral)   Resp (!) 39   Ht 4\' 11"  (1.499 m)   Wt 72.6 kg (160 lb)   SpO2 93%   BMI 32.32 kg/m    REVIEW OF SYSTEMS  PATIENT IS UNABLE TO PROVIDE COMPLETE REVIEW OF SYSTEM S DUE TO SEVERE CRITICAL ILLNESS AND ENCEPHALOPATHY   PHYSICAL EXAMINATION:  GENERAL:critically ill appearing, -resp distress HEAD: Normocephalic, atraumatic.  EYES: Pupils equal, round, reactive to light.  No scleral icterus.  MOUTH: Moist mucosal membrane. NECK: Supple. No thyromegaly. No nodules. No JVD.  PULMONARY: clear to auscultation bilaterally  CARDIOVASCULAR: S1 and S2. Irregular rate and rhythm. No murmurs, rubs, or gallops. 1+ dorsalis pedis pulses bilaterally, 2+ radial pulses bilaterally GASTROINTESTINAL: Mild tenderness to palpation, LLQ>RLQ. Mild distention. No masses. Diminished bowel sounds. No hepatosplenomegaly.  MUSCULOSKELETAL: No swelling, clubbing, or edema.  NEUROLOGIC: alert and oriented x 3 SKIN:intact,warm,dry  ASSESSMENT AND PLAN SYNOPSIS  77 year old female with perforated bowel secondary to diverticulitis with chronic atrial fibrillation. Patient continues with medical management by surgery with possible CT scan today or tomorrow. Patient continues to be high risk for surgery secondary to atrial fibrillation.   Patient started on heparin yesterday. Lovenox d/c.   Patient is status  DO NOT RESUSCITATE/DO NOT INTUBATE  GASTROINTESTINAL: -perforated bowel secondary to diverticulitis -surgery following: possible CT scan today to determine further management -multiple episodes of vomiting: zofran and phenergan -continue TPN  PULMONARY -respiratory distress secondary to abdominal pain -improving, continue bronchodilators and incentive spirometry   AKI with Lactic Acidosis: -resolved, Creatinine 0.67 -potassium 3.5: replaced  CARDIAC -chronic atrial fibrillation, remains in a fib this morning -amiodarone drip, diltiazem drip, metoprolol IV , and digoxin  -hold off on converting to oral meds as patient continues to be nauseous -heparin drip started -d/c lovenox   NEUROLOGY -acute encephalopathy secondary to abdominal pain -improving today, alert and oriented   ID -Levaquin and flagyl for intra abdominal infection -WBC with spike from 14.0-->16.8, afebrile. Will trend  DVT: heparin GI: pepcid  TRANSFUSIONS AS NEEDED MONITOR FSBS ASSESS the need for LABS as needed   Critical Care Time devoted to patient care services described in this note is 32 minutes.   Overall, patient is critically ill, prognosis is guarded.  Patient with Multiorgan failure and at high risk for cardiac arrest and death.

## 2018-01-03 NOTE — Progress Notes (Signed)
PT Cancellation Note  Patient Details Name: Jody [name] MRN: [id number] DOB: Jun 02, 1941   Cancelled Treatment:    Reason Eval/Treat Not Completed: Patient's level of consciousness;Patient not medically ready Spoke with nursing who states that pt had a good day yesterday but has needed ativan today, also has had elevated HR was ~120 when talking with nursing.  Will attempt PT exam tomorrow if pt is appropriate.    Kreg Shropshire, DPT 01/03/2018, 3:47 PM

## 2018-01-04 ENCOUNTER — Inpatient Hospital Stay: Payer: Medicare Other

## 2018-01-04 LAB — BASIC METABOLIC PANEL
ANION GAP: 5 (ref 5–15)
BUN: 21 mg/dL (ref 8–23)
CALCIUM: 7.9 mg/dL — AB (ref 8.9–10.3)
CHLORIDE: 95 mmol/L — AB (ref 98–111)
CO2: 32 mmol/L (ref 22–32)
Creatinine, Ser: 0.54 mg/dL (ref 0.44–1.00)
GFR calc Af Amer: >60 mL/min (ref >60)
GFR calc non Af Amer: >60 mL/min (ref >60)
GLUCOSE: 265 mg/dL — AB (ref 70–99)
POTASSIUM: 3.7 mmol/L (ref 3.5–5.1)
Sodium: 132 mmol/L — ABNORMAL LOW (ref 135–145)

## 2018-01-04 LAB — GLUCOSE, CAPILLARY
GLUCOSE-CAPILLARY: 246 mg/dL — AB (ref 70–99)
GLUCOSE-CAPILLARY: 218 mg/dL — AB (ref 70–99)
GLUCOSE-CAPILLARY: 172 mg/dL — AB (ref 70–99)
GLUCOSE-CAPILLARY: 164 mg/dL — AB (ref 70–99)
Glucose-Capillary: 193 mg/dL — ABNORMAL HIGH (ref 70–99)
Glucose-Capillary: 196 mg/dL — ABNORMAL HIGH (ref 70–99)

## 2018-01-04 LAB — CBC WITH DIFFERENTIAL/PLATELET
BASOS ABS: 0.1 10*3/uL (ref 0–0.1)
Basophils Relative: 1 %
Eosinophils Absolute: 0.1 10*3/uL (ref 0–0.7)
Eosinophils Relative: 0 %
HEMATOCRIT: 32.9 % — AB (ref 35.0–47.0)
HEMOGLOBIN: 10.9 g/dL — AB (ref 12.0–16.0)
LYMPHS PCT: 2 %
Lymphs Abs: 0.3 10*3/uL — ABNORMAL LOW (ref 1.0–3.6)
MCH: 27 pg (ref 26.0–34.0)
MCHC: 33 g/dL (ref 32.0–36.0)
MCV: 81.8 fL (ref 80.0–100.0)
MONO ABS: 0.8 10*3/uL (ref 0.2–0.9)
Monocytes Relative: 4 %
NEUTROS ABS: 18.6 10*3/uL — AB (ref 1.4–6.5)
NEUTROS PCT: 93 %
Platelets: 345 10*3/uL (ref 150–440)
RBC: 4.02 MIL/uL (ref 3.80–5.20)
RDW: 16.5 % — AB (ref 11.5–14.5)
WBC: 19.9 10*3/uL — ABNORMAL HIGH (ref 3.6–11.0)

## 2018-01-04 LAB — PHOSPHORUS: PHOSPHORUS: 3.2 mg/dL (ref 2.5–4.6)

## 2018-01-04 LAB — HEPARIN LEVEL (UNFRACTIONATED)
HEPARIN UNFRACTIONATED: 0.27 [IU]/mL — AB (ref 0.30–0.70)
Heparin Unfractionated: 0.28 IU/mL — ABNORMAL LOW (ref 0.30–0.70)
Heparin Unfractionated: 0.35 IU/mL (ref 0.30–0.70)

## 2018-01-04 LAB — MAGNESIUM: Magnesium: 2.1 mg/dL (ref 1.7–2.4)

## 2018-01-04 MED ORDER — IOPAMIDOL (ISOVUE-370) INJECTION 76%
75.0000 mL | Freq: Once | INTRAVENOUS | Status: AC | PRN
  Administered 2018-01-04: 75 mL via INTRAVENOUS

## 2018-01-04 MED ORDER — HEPARIN BOLUS VIA INFUSION
900.0000 [IU] | Freq: Once | INTRAVENOUS | Status: AC
  Administered 2018-01-04: 900 [IU] via INTRAVENOUS
  Filled 2018-01-04: qty 900

## 2018-01-04 MED ORDER — LORAZEPAM 2 MG/ML IJ SOLN
0.5000 mg | Freq: Once | INTRAMUSCULAR | Status: AC
  Administered 2018-01-04: 0.5 mg via INTRAVENOUS
  Filled 2018-01-04: qty 1

## 2018-01-04 MED ORDER — SODIUM CHLORIDE 0.9 % IV BOLUS
1000.0000 mL | Freq: Once | INTRAVENOUS | Status: AC
  Administered 2018-01-04: 1000 mL via INTRAVENOUS

## 2018-01-04 MED ORDER — HEPARIN BOLUS VIA INFUSION
850.0000 [IU] | Freq: Once | INTRAVENOUS | Status: AC
  Administered 2018-01-04: 850 [IU] via INTRAVENOUS
  Filled 2018-01-04: qty 850

## 2018-01-04 MED ORDER — TRACE MINERALS CR-CU-MN-SE-ZN 10-1000-500-60 MCG/ML IV SOLN
INTRAVENOUS | Status: AC
  Administered 2018-01-04: 18:00:00 via INTRAVENOUS
  Filled 2018-01-04: qty 1992

## 2018-01-04 NOTE — Progress Notes (Signed)
Nutrition Follow-up  DOCUMENTATION CODES:   Not applicable  INTERVENTION:  Continue Clinimix E 5/15 at 83 mL/hr.   Tomorrow (8/1) can initiate lipids. Recommend 20% ILE at 20 mL/hr over 12 hours.  Goal TPN regimen including lipids provides 1894 kcal, 100 grams of protein, 1992 mL fluids daily (+240 mL lipids).  Continue adult MVI and trace elements as daily TPN additives.  Measure daily weights while on TPN to assist in monitoring volume status.  Concerned patient is becoming volume-overloaded on TPN as sodium is trending down, but difficult to assess as there is no weight trend in chart. Patient does have generalized mild pitting edema. If medically appropriate consider gentle diuresis as there is no way to concentrate TPN with Clinimix. The only option would be to decrease TPN rate, which would in turn lower the amount of calories and protein patient will receive.  NUTRITION DIAGNOSIS:   Inadequate oral intake related to acute illness(colon perf) as evidenced by per patient/family report, NPO status.  Ongoing - addressing with TPN.  GOAL:   Provide needs based on ASPEN/SCCM guidelines  Met with TPN.  MONITOR:   I & O's, Labs  REASON FOR ASSESSMENT:   Consult New TPN/TNA  ASSESSMENT:   Patient with PMH of A-fib, Lung Cancer, Asthma, HTN, and borderline DM presents with abdominal pain. On CT she was noted to have diffuse inflammation of the distal jejenum extending to the descending/sigmoid colon with perforation which may be secondary to diverticulitis.   Patient has had ongoing N/V. Per chart she had 8-10 episodes of emesis yesterday. Husband reports that so far today she has not had any emesis. Patient had repeat CT abdomen/pelvis this AM which found continued significant dilation of proximal and mid small bowel loops and decompressed distal small bowel loops most compatible with SBO. Likely small abscess slightly increased since previous study. Pending plan from  surgery.  IV Access: right basilic PICC triple lumen placed 7/26; tip terminates at upper cavoatrial junction per chest x-ray 7/26  TPN: pt tolerating Clinimix E 5/15 at 83 mL/hr; lipids are being held; plan is to add 10 units insulin into TPN tonight  Medications reviewed and include: Novolog 0-15 units Q4hrs (pt received 26 units since moderate sliding scale was started 7/30 at 1600), metoprolol, amiodarone gtt, diltiazem gtt, famotidine, heparin gtt, Levaquin, Flagyl.  Labs reviewed: CBG 175-249 past 24 hrs, Sodium 132 (trending down), Chloride 95.  I/O: 1425 mL UOP yesterday (0.8 mL/kg/hr)  No weights since admission to trend.  Discussed with RN and on rounds.  Diet Order:   Diet Order           Diet NPO time specified Except for: Sips with Meds  Diet effective now          EDUCATION NEEDS:   Not appropriate for education at this time  Skin:  Skin Assessment: Reviewed RN Assessment  Last BM:  12/30/2017 per chart but no BM characteristics recorded  Height:   Ht Readings from Last 1 Encounters:  12/27/17 _0  (1.499 m)    Weight:   Wt Readings from Last 1 Encounters:  12/27/17 160 lb (72.6 kg)    Ideal Body Weight:  44.69 kg  BMI:  Body mass index is 32.32 kg/m.  Estimated Nutritional Needs:   Kcal:  1815-2180 (25-30 kcal/kg)  Protein:  87-102 grams (1.2-1.4g/kg)  Fluid:  1.8 L/day (25 mL/kg)  Willey Blade, MS, RD, LDN Office: [phone] Pager: [phone] After Hours/Weekend Pager: [phone]

## 2018-01-04 NOTE — Progress Notes (Signed)
CRITICAL CARE NOTE  CC  follow up perforated bowel secondary to diverticulitis and chronic atrial fibrillation  SUBJECTIVE Patient remains critically ill Prognosis is guarded  77 year old female admitted on 7/26 for perforated bowel secondary to diverticulitis. Patient with multiple episodes of vomiting yesterday with continued nausea, refractory to treatment with zofran and phenergan. Patient is alert this morning. She is being taken for repeat CT this morning. She continues to be in a fib.   SIGNIFICANT EVENTS  Ativan given x2 for restlessness yesterday.   BP 122/80   Pulse 99   Temp 97.7 F (36.5 C) (Axillary)   Resp (!) 22   Ht 4\' 11"  (1.499 m)   Wt 72.6 kg (160 lb)   SpO2 96%   BMI 32.32 kg/m    REVIEW OF SYSTEMS  PATIENT IS UNABLE TO PROVIDE COMPLETE REVIEW OF SYSTEM S DUE TO SEVERE CRITICAL ILLNESS AND ENCEPHALOPATHY   PHYSICAL EXAMINATION:  GENERAL:critically ill appearing, -resp distress HEAD: Normocephalic, atraumatic.  EYES: Pupils equal, round, reactive to light.  No scleral icterus.  MOUTH: Moist mucosal membrane. NECK: Supple. No thyromegaly. No nodules. No JVD.  PULMONARY: +bilateral rhonchi, no wheezing  CARDIOVASCULAR: S1 and S2. Irregular rate and rhythm. No murmurs, rubs, or gallops.  GASTROINTESTINAL: Moderate distention with mild tenderness to palpation in LLQ. No rebound tenderness or guarding. Diminished bowel sounds.  MUSCULOSKELETAL: No swelling, clubbing, or edema.  NEUROLOGIC: alert and oriented x 3, drowsy SKIN:intact,warm,dry  ASSESSMENT AND PLAN SYNOPSIS  77 year old female with perforated bowel secondary to diverticulitis with chronic atrial fibrillation. Patient continues with medical management by surgery. CT scan this morning to assess for possible IR or surgical management. Patient high risk for surgery secondary to a fib and anticoagulation status.   Patient is status DO NOT RESUSCITATE/DO NOT  INTUBATE  GASTROINTESTINAL: -perforated bowel secondary to diverticulitis -surgery following: CT scan today  -AXR yesterday shows obstruction vs. Ileus  -continuing to vomit: zofran and phenergan PRN -TPN held    PULMONARY -remains on 2L of 02 this morning -respiratory distress continuing to improve -continue xopenex as needed for wheezing   AKI with lactic acidosis -resolved -will monitor creatinine after CT scan today -potassium 3.7--> monitor and replace -sodium 132: patient is 9L+ fluid balance  -will discuss with dietary to adjust TPN   NEUROLOGY - acute encephalopathy secondary to abdominal infection -continuing to improve, appears drowsy likely secondary to ativan   CARDIAC -chronic atrial fibrillation -amiodarone drip, diltiazem drip, metoprolol IV, and digoxin -heparin drip  ID -continue levaquin and flagyl  -Leukocytosis with predominance of neutrophils: 16.8-->19.9 -patient continues to be afebrile    DVT; heparin GI; pepcid  TRANSFUSIONS AS NEEDED MONITOR FSBS ASSESS the need for LABS as needed   Critical Care Time devoted to patient care services described in this note is 30 minutes.   Overall, patient is critically ill, prognosis is guarded.  Patient with Multiorgan failure and at high risk for cardiac arrest and death.

## 2018-01-04 NOTE — Progress Notes (Signed)
Inpatient Diabetes Program Recommendations  AACE/ADA: New Consensus Statement on Inpatient Glycemic Control (2019)  Target Ranges:  Prepandial:   less than 140 mg/dL      Peak postprandial:   less than 180 mg/dL (1-2 hours)      Critically ill patients:  140 - 180 mg/dL   Results for Jody [name], Jody [name] (MRN [id number]) as of 01/04/2018 10:10  Ref. Range 01/03/2018 07:17 01/03/2018 11:52 01/03/2018 15:30 01/03/2018 19:43 01/03/2018 23:40 01/04/2018 03:37 01/04/2018 07:11  Glucose-Capillary Latest Ref Range: 70 - 99 mg/dL 218 (H) 226 (H) 175 (H) 215 (H) 249 (H) 246 (H) 218 (H)   Review of Glycemic Control  Diabetes history: Borderline DM Outpatient Diabetes medications: None Current orders for Inpatient glycemic control: Novolog 0-15 units Q4H  Inpatient Diabetes Program Recommendations:  TPN:  Per Critical Care note this am, TPN on hold. If TPN will be resumed,  pharmacy may want to consider adding insulin to TPN to help improve glycemic control. Insulin - Basal: Noted glucose ranged from 215-249 mg/dl over the past 12 hours and per note this am TPN is on hold. If CBGs continue to be consistently greater than 180 mg/dl, please consider ordering one time dose of Lantus 5 units and re-assess daily to determine if Lantus is needed.  Thanks, Barnie Alderman, RN, MSN, CDE Diabetes Coordinator Inpatient Diabetes Program [phone] (Team Pager from 8am to 5pm)

## 2018-01-04 NOTE — Evaluation (Signed)
Physical Therapy Evaluation Patient Details Name: Jody [name] MRN: [id number] DOB: [birth year] Today's Date: 01/04/2018   History of Present Illness  Jody [name] is a 77 y.o. female wo arrived to ED because of concerns for abdominal pain, nausea and emesis. PMH of A FIB, Lung CA, HTN, Anxiety, clinical depression. Imaging discovered perforated bowel. On going Acute encephalopathy secondary to abdominal infection, and respiratory distress secondary to abdominal pain and anxiety. Plan is to have ex-lap on 01/05/18.   Clinical Impression  Pt tolerated treatment well today, successfully sitting up on EOB for first time since hospitalization in CCU. Pt was lethargic on PT arrival, and occasionally dozed off during therapy. Pt was able to hold a conversation with PT, but had difficulty vocalizing, and lost train of  thought often.  Pt showed active contraction of all major muscle groups of BLE, although pt was unable to move BLE against gravity. Pt BUE were generally weak but at least 3/5 in all major muscle groups, and could actively move through full ROM with shoulder flex and elbow flex/ext. Pt was agreeable to sitting up on EOB, and was able to do so with max assist 2+. Pt vitals remained WFL during all activities, and pt reported no adverse symptoms. Pt former level of function and home information was deferred to next session due to pt's inability to engage in the conversation with the therapist. Would benefit from skilled PT to address above deficits and promote optimal return to PLOF. Recommend transition to STR upon discharge from acute hospitalization.    Follow Up Recommendations SNF    Equipment Recommendations       Recommendations for Other Services       Precautions / Restrictions Restrictions Weight Bearing Restrictions: No Other Position/Activity Restrictions: Abdominal pain secondary to perforated bowel. NG Tube.      Mobility  Bed Mobility Overal bed mobility: Needs  Assistance Bed Mobility: Supine to Sit;Sit to Supine     Supine to sit: +2 for physical assistance;Max assist Sit to supine: +2 for physical assistance;Max assist   General bed mobility comments: Pt has difficulty moving trunk anteriorly duirng supine to sit. Needs assistance moveing feet off of EOB and flexing trunk.  Requires support posteriorly to maintain sitting position.   Transfers                 General transfer comment: N/A  Ambulation/Gait             General Gait Details: N/A  Stairs            Wheelchair Mobility    Modified Rankin (Stroke Patients Only)       Balance Overall balance assessment: Needs assistance Sitting-balance support: Bilateral upper extremity supported;Feet supported Sitting balance-Leahy Scale: Poor Sitting balance - Comments: Pt requires assitance posteriorly at all times to maintain upright posture, even with verbal and physical cues  Postural control: Posterior lean     Standing balance comment: Not tested today due to pt inability to stand at this time. Additionally MD and nursing staff asked PT to not move pt from bed at this time.                              Pertinent Vitals/Pain Pain Assessment: No/denies pain    Home Living Family/patient expects to be discharged to:: Private residence Living Arrangements: Spouse/significant other Available Help at Discharge: Family  Additional Comments: Pt is unable to participate in questions regarding home due to lethargy and cognitive issues secondary to encephalopathy. Plan to follow up at future date with family    Prior Function           Comments: Pt is unable to participate in questions regarding prior level of function due to lethargy and cognitive issues secondary to encephalopathy. Plan to follow up at future date with family     Hand Dominance        Extremity/Trunk Assessment   Upper Extremity Assessment Upper Extremity  Assessment: (BUE 3/5 MMT all major muscle groups. )    Lower Extremity Assessment Lower Extremity Assessment: (BLE muscle actviation in all major msucle groups, but unable to move against gravity without therapist assist)       Communication   Communication: Other (comment)(difficulty vocalizing due to lethargy and fatigue. )  Cognition Arousal/Alertness: Lethargic Behavior During Therapy: Flat affect Overall Cognitive Status: Difficult to assess                                 General Comments: Cognitive status is difficult to fully assess due to pt lethargy and decreased alertness. Pt is able to follow single step commands and is A+O x 3       General Comments      Exercises General Exercises - Upper Extremity Shoulder Flexion: AROM;Supine;10 reps;Both Elbow Flexion: 10 reps;Both;AROM;Supine Elbow Extension: AROM;10 reps;Both;Supine General Exercises - Lower Extremity Straight Leg Raises: AAROM;10 reps;Both;Supine Low Level/ICU Exercises Ankle Circles/Pumps: AROM;20 reps;Both Heel Slides: AAROM;10 reps;Both;Supine Other Exercises Other Exercises: Bed mobility: Supine to/from sit max assist 2+   Assessment/Plan    PT Assessment Patient needs continued PT services  PT Problem List Decreased strength;Decreased mobility;Decreased range of motion;Decreased activity tolerance;Decreased balance;Decreased cognition;Decreased safety awareness       PT Treatment Interventions DME instruction;Gait training;Therapeutic activities;Therapeutic exercise;Stair training;Functional mobility training;Balance training;Patient/family education    PT Goals (Current goals can be found in the Care Plan section)       Frequency Min 2X/week   Barriers to discharge        Co-evaluation               AM-PAC PT "6 Clicks" Daily Activity  Outcome Measure Difficulty turning over in bed (including adjusting bedclothes, sheets and blankets)?: Unable Difficulty moving  from lying on back to sitting on the side of the bed? : Unable Difficulty sitting down on and standing up from a chair with arms (e.g., wheelchair, bedside commode, etc,.)?: Unable Help needed moving to and from a bed to chair (including a wheelchair)?: Total Help needed walking in hospital room?: Total Help needed climbing 3-5 steps with a railing? : Total 6 Click Score: 6    End of Session   Activity Tolerance: Patient tolerated treatment well;Patient limited by fatigue Patient left: with bed alarm set;in bed;with call bell/phone within reach Nurse Communication: Mobility status PT Visit Diagnosis: Muscle weakness (generalized) (M62.81);Unsteadiness on feet (R26.81)    Time: 1937-9024 PT Time Calculation (min) (ACUTE ONLY): 30 min   Charges:             Hortencia Conradi, SPT 01/04/18,3:04 PM

## 2018-01-04 NOTE — Progress Notes (Signed)
[place] for heparin drip management  Indication: atrial fibrillation  Allergies  Allergen Reactions  . Poison Oak Extract Other (See Comments)    blistering  . Ampicillin Rash  . Penicillin V Potassium Rash    Has patient had a PCN reaction causing immediate rash, facial/tongue/throat swelling, SOB or lightheadedness with hypotension: No Has patient had a PCN reaction causing severe rash involving mucus membranes or skin necrosis: No Has patient had a PCN reaction that required hospitalization: No Has patient had a PCN reaction occurring within the last 10 years: No If all of the above answers are "NO", then may proceed with Cephalosporin use.     Patient Measurements: Height: 4\' 11"  (149.9 cm) Weight: 186 lb 1.1 oz (84.4 kg) IBW/kg (Calculated) : 43.2 Heparin Dosing Weight: 60kg  Vital Signs: Temp: 98 F (36.7 C) (07/31 1357) Temp Source: Oral (07/31 1357) BP: 106/52 (07/31 1900) Pulse Rate: 72 (07/31 1900)  Labs: Recent Labs    01/02/18 0435  01/03/18 0421  01/04/18 0212 01/04/18 1102 01/04/18 1954  HGB 10.7*  --  10.6*  --  10.9*  --   --   HCT 31.1*  --  31.5*  --  32.9*  --   --   PLT 274  --  305  --  345  --   --   HEPARINUNFRC  --    < >  --    < > 0.28* 0.27* 0.35  CREATININE 0.67  --  0.67  --  0.54  --   --    < > = values in this interval not displayed.    Estimated Creatinine Clearance: 53.7 mL/min (by C-G formula based on SCr of 0.54 mg/dL).   Medications:  Scheduled:  . chlorhexidine  15 mL Mouth Rinse BID  . digoxin  0.125 mg Intravenous Daily  . insulin aspart  0-15 Units Subcutaneous Q4H  . mouth rinse  15 mL Mouth Rinse q12n4p  . metoprolol tartrate  5 mg Intravenous Q6H  . sodium chloride flush  10-40 mL Intracatheter Q12H   Infusions:  . Marland KitchenTPN (CLINIMIX-E) Adult 83 mL/hr at 01/04/18 1805  . amiodarone 30 mg/hr (01/04/18 1650)  . diltiazem (CARDIZEM) infusion 15 mg/hr (01/04/18 1922)  .  famotidine (PEPCID) IV Stopped (01/04/18 1117)  . heparin 1,300 Units/hr (01/04/18 1139)  . levofloxacin (LEVAQUIN) IV Stopped (01/04/18 1254)  . metronidazole Stopped (01/04/18 1444)  . sodium chloride      Assessment: Pharmacy consulted for heparin drip management for 77 yo female admitted to ICU with perforated bowel. Patient with chronic atrial fibrillation on apixaban as an outpatient. Patient has not been ordered apixaban this admission. Patient ordered enoxaparin 40mg  SQ Daily with last dose at 0915. Patient CHA2DS2/VAS of at least 4 (Age, Gender, HTN).   Heparin infusing at 1200 units/hr 7/31 1100 Heparin level resulted at 0.27  Goal of Therapy:  Heparin level 0.3-0.7 units/ml Monitor platelets by anticoagulation protocol: Yes   Plan:  Will order Heparin 900 units IV bolus and increase drip rate to 1300 units/hr. Next Heparin level in 8 hours.  Pharmacy will continue to monitor and adjust per consult.   7/31:  HL @ 20:00 = 0.35 Will continue this pt on current rate and recheck HL in 8 hrs.   Waymond Meador D 01/04/2018 8:40 PM

## 2018-01-04 NOTE — Progress Notes (Signed)
[facility] for heparin drip management  Indication: atrial fibrillation  Allergies  Allergen Reactions  . Poison Oak Extract Other (See Comments)    blistering  . Ampicillin Rash  . Penicillin V Potassium Rash    Has patient had a PCN reaction causing immediate rash, facial/tongue/throat swelling, SOB or lightheadedness with hypotension: No Has patient had a PCN reaction causing severe rash involving mucus membranes or skin necrosis: No Has patient had a PCN reaction that required hospitalization: No Has patient had a PCN reaction occurring within the last 10 years: No If all of the above answers are "NO", then may proceed with Cephalosporin use.     Patient Measurements: Height: 4\' 11"  (149.9 cm) Weight: 160 lb (72.6 kg) IBW/kg (Calculated) : 43.2 Heparin Dosing Weight: 60kg  Vital Signs: Temp: 97.7 F (36.5 C) (07/31 0735) Temp Source: Axillary (07/31 0735) BP: 120/73 (07/31 1000) Pulse Rate: 109 (07/31 1000)  Labs: Recent Labs    01/02/18 0435  01/03/18 0421  01/03/18 1712 01/04/18 0212 01/04/18 1102  HGB 10.7*  --  10.6*  --   --  10.9*  --   HCT 31.1*  --  31.5*  --   --  32.9*  --   PLT 274  --  305  --   --  345  --   HEPARINUNFRC  --    < >  --    < > 0.16* 0.28* 0.27*  CREATININE 0.67  --  0.67  --   --  0.54  --    < > = values in this interval not displayed.    Estimated Creatinine Clearance: 49.5 mL/min (by C-G formula based on SCr of 0.54 mg/dL).   Medications:  Scheduled:  . chlorhexidine  15 mL Mouth Rinse BID  . digoxin  0.125 mg Intravenous Daily  . insulin aspart  0-15 Units Subcutaneous Q4H  . mouth rinse  15 mL Mouth Rinse q12n4p  . metoprolol tartrate  5 mg Intravenous Q6H  . sodium chloride flush  10-40 mL Intracatheter Q12H   Infusions:  . Marland KitchenTPN (CLINIMIX-E) Adult 83 mL/hr at 01/04/18 0600  . amiodarone 30 mg/hr (01/04/18 0600)  . diltiazem (CARDIZEM) infusion 15 mg/hr (01/04/18 0808)  .  famotidine (PEPCID) IV Stopped (01/04/18 1117)  . heparin 1,200 Units/hr (01/04/18 1043)  . levofloxacin (LEVAQUIN) IV 750 mg (01/04/18 1124)  . metronidazole 500 mg (01/04/18 0553)  . sodium chloride      Assessment: Pharmacy consulted for heparin drip management for 77 yo female admitted to ICU with perforated bowel. Patient with chronic atrial fibrillation on apixaban as an outpatient. Patient has not been ordered apixaban this admission. Patient ordered enoxaparin 40mg  SQ Daily with last dose at 0915. Patient CHA2DS2/VAS of at least 4 (Age, Gender, HTN).   Heparin infusing at 1200 units/hr 7/31 1100 Heparin level resulted at 0.27  Goal of Therapy:  Heparin level 0.3-0.7 units/ml Monitor platelets by anticoagulation protocol: Yes   Plan:  Will order Heparin 900 units IV bolus and increase drip rate to 1300 units/hr. Next Heparin level in 8 hours.  Pharmacy will continue to monitor and adjust per consult.   [name], PharmD, BCPS 01/04/2018 11:34 AM

## 2018-01-04 NOTE — Progress Notes (Addendum)
Subjective:    Jody [name] is a 77 y.o. female  Hospital stay day 8,   bowel perforation  Nausea and episodes of emesis reported last night.  CT done this am as well.  ROS:  A 5 point review of systems was performed and pertinent positives and negatives noted in HPI. Objective:      Temp:  [97.7 F (36.5 C)-98.4 F (36.9 C)] 97.7 F (36.5 C) (07/31 0735) Pulse Rate:  [92-127] 109 (07/31 1000) Resp:  [19-29] 22 (07/31 1000) BP: (120-157)/(65-94) 120/73 (07/31 1000) SpO2:  [93 %-97 %] 96 % (07/31 1000)     Height: 4\' 11"  (149.9 cm) Weight: 72.6 kg (160 lb) BMI (Calculated): 32.3   Intake/Output this shift:  Total I/O In: 310.4 [I.V.:310.4] Out: 200 [Urine:200]   Intake/Output last 2 shifts:  @IOLAST2SHIFTS @    Constitutional :  cooperative and mild distress  Respiratory:  clear to auscultation bilaterally  Cardiovascular:  regularly irregular rhythm  Gastrointestinal: soft, but focal tenderness persists, unchanged from previous exam.   Skin: Cool and moist.   Psychiatric: Normal affect, non-agitated, not confused       LABS:  CMP Latest Ref Rng & Units 01/04/2018 01/03/2018 01/02/2018  Glucose 70 - 99 mg/dL 265(H) 242(H) 174(H)  BUN 8 - 23 mg/dL 21 17 15   Creatinine 0.44 - 1.00 mg/dL 0.54 0.67 0.67  Sodium 135 - 145 mmol/L 132(L) 135 136  Potassium 3.5 - 5.1 mmol/L 3.7 3.5 3.6  Chloride 98 - 111 mmol/L 95(L) 97(L) 98  CO2 22 - 32 mmol/L 32 33(H) 32  Calcium 8.9 - 10.3 mg/dL 7.9(L) 7.7(L) 7.6(L)  Total Protein 6.5 - 8.1 g/dL - 4.8(L) 4.6(L)  Total Bilirubin 0.3 - 1.2 mg/dL - 0.3 0.3  Alkaline Phos 38 - 126 U/L - 46 50  AST 15 - 41 U/L - 17 23  ALT 0 - 44 U/L - 11 10   CBC Latest Ref Rng & Units 01/04/2018 01/03/2018 01/02/2018  WBC 3.6 - 11.0 K/uL 19.9(H) 16.8(H) 14.0(H)  Hemoglobin 12.0 - 16.0 g/dL 10.9(L) 10.6(L) 10.7(L)  Hematocrit 35.0 - 47.0 % 32.9(L) 31.5(L) 31.1(L)  Platelets 150 - 440 K/uL 345 305 274    RADS: CLINICAL DATA:  Follow-up.  Bowel  perforation.  EXAM: CT ABDOMEN AND PELVIS WITH CONTRAST  TECHNIQUE: Multidetector CT imaging of the abdomen and pelvis was performed using the standard protocol following bolus administration of intravenous contrast.  CONTRAST:  16mL ISOVUE-370 IOPAMIDOL (ISOVUE-370) INJECTION 76%  COMPARISON:  12/30/2017  FINDINGS: Lower chest: Large bilateral pleural effusions with compressive atelectasis in the lower lobes. Cardiomegaly.  Hepatobiliary: No focal liver abnormality is seen. Status post cholecystectomy. No biliary dilatation.  Pancreas: No focal abnormality or ductal dilatation.  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  Adrenals/Urinary Tract: Foley catheter in the bladder with gas in the bladder. Bladder is decompressed. No adrenal or renal mass. Left parapelvic cysts. No adrenal mass.  Stomach/Bowel: Distal small bowel is decompressed. Colon decompressed. Diffuse colonic diverticulosis, most pronounced in the left colon. Continued dilated proximal and mid small bowel loops suggesting small-bowel obstruction. Exact transition point not identified. Small fluid collection in the left paracolic gutter with enhancing rim may reflect abscess. This may have progressed slightly since prior study. Other previously seen interloop fluid collections not definitively seen. There is a fluid collection noted in the lower pelvis, best seen on coronal imaging such as image 51. This is unclear  if this represents an abscess or fluid within small bowel loops.  Vascular/Lymphatic: Aortic atherosclerosis. No enlarged abdominal or pelvic lymph nodes.  Reproductive: Uterus and adnexa unremarkable.  No mass.  Other: No free air.  Musculoskeletal: No acute bony abnormality. Degenerative changes in the lumbar spine.  IMPRESSION: Continued significant dilatation of proximal and mid small bowel loops with  decompressed distal small bowel loops. Findings most compatible with small bowel obstruction. This is similar prior study.  Fluid collection in the left paracolic gutter likely reflects small abscess, slightly increased since prior study. There is a fluid collection in the lower pelvis. This is unclear if this represents fluid within nondilated small bowel loops or a separate abscess. This measures approximately 6.8 cm on image 75.  Left colonic diverticulosis.  Large bilateral pleural effusions with compressive atelectasis.  Cardiomegaly.   Electronically Signed   By: Rolm Baptise M.D.   On: 01/04/2018 09:09 Assessment:      Bowel perforation.  Small bowel versus diverticulitis.  With the increase in white count today and CT scan showing no major improvement I have recommended exploratory laparotomy possible lysis of adhesions possible bowel resection possible ostomy to the family members.  I do not believe continued IV antibiotic therapy would improve her clinical picture at this point.  With no further improvement in her CT scan I believe a IR guided catheter drainage of 1 of the fluid collections will also not clinically improve her status significantly.  Infectious disease was consulted and they also did not have much more to recommend except for surgical drainage and then switching over to imipenem postop.  I did reiterate to family members present that although she is more stable compared to the time of admission she is still at a very high risk for developing complications after the surgery.  Risks including but not limited to bleeding, continued infection, injury to surrounding organs, chronic pain, hernia formation, poor wound healing and repeat procedures to address such issues.  Benefits of surgery includes improvement in her clinical status.  Alternatives include continued antibiotic treatment but I did reiterate we are unlikely to make any progress at this  point.  Husband and family members at bedside are tentatively agreeable to proceeding with surgery tomorrow.  Will hold heparin drip tomorrow morning prior to procedure all answers and concerns addressed at this point.  NG tube will be placed due to the symptomatic bowel dilation at this point

## 2018-01-04 NOTE — Progress Notes (Signed)
PHARMACY - ADULT TOTAL PARENTERAL NUTRITION CONSULT NOTE   Pharmacy Consult for TPN Management  Indication: colon perforation, NPO Status   Patient Measurements: Height: 4\' 11"  (149.9 cm) Weight: 186 lb 1.1 oz (84.4 kg) IBW/kg (Calculated) : 43.2 TPN AdjBW (KG): 50.5 Body mass index is 37.58 kg/m.   Assessment:   Pharmacy consulted for TPN management for 77 yo female with diffuse inflammation on distal jejunum extending to sigmoid colon with perforation. Patient has been NPO since Sunday. Patient is NPO and at risk for refeeding.  TPN Access: PICC Triple Lumen  TPN start date: 12/30/17 Nutritional Goals (per RD recommendation on 7/26): KCal: 1500-1700 kcal  Protein: 87-102 grams  Fluid: > 1.5 L Goal TPN rate is 83 ml/hr (provides 1414 kcal and 100 grams of protein)  Current Nutrition:   Plan:  Clinimix E 5/15 at 37mL/hr on 7/29.   Hold 20% lipid emulsion for first 7 days for ICU patients per ASPEN guidelines (Start date 8/1)  Add MVI, trace elements to TPN  Patient required 26 units of SSI, will add 10 units of insulin to TPN.  Monitor TPN labs. Blood Glucose.   Pharmacy will continue to monitor and adjust per consult.   [name], PharmD, BCPS 01/04/2018 3:06 PM

## 2018-01-04 NOTE — Progress Notes (Signed)
[place] for heparin drip management  Indication: atrial fibrillation  Allergies  Allergen Reactions  . Poison Oak Extract Other (See Comments)    blistering  . Ampicillin Rash  . Penicillin V Potassium Rash    Has patient had a PCN reaction causing immediate rash, facial/tongue/throat swelling, SOB or lightheadedness with hypotension: No Has patient had a PCN reaction causing severe rash involving mucus membranes or skin necrosis: No Has patient had a PCN reaction that required hospitalization: No Has patient had a PCN reaction occurring within the last 10 years: No If all of the above answers are "NO", then may proceed with Cephalosporin use.     Patient Measurements: Height: 4\' 11"  (149.9 cm) Weight: 160 lb (72.6 kg) IBW/kg (Calculated) : 43.2 Heparin Dosing Weight: 60kg  Vital Signs: Temp: 98.4 F (36.9 C) (07/30 2000) Temp Source: Oral (07/30 2000) BP: 157/83 (07/31 0200) Pulse Rate: 117 (07/31 0200)  Labs: Recent Labs    01/02/18 0435  01/03/18 0421 01/03/18 0722 01/03/18 1712 01/04/18 0212  HGB 10.7*  --  10.6*  --   --  10.9*  HCT 31.1*  --  31.5*  --   --  32.9*  PLT 274  --  305  --   --  345  HEPARINUNFRC  --    < >  --  0.13* 0.16* 0.28*  CREATININE 0.67  --  0.67  --   --  0.54   < > = values in this interval not displayed.    Estimated Creatinine Clearance: 49.5 mL/min (by C-G formula based on SCr of 0.54 mg/dL).   Medications:  Scheduled:  . chlorhexidine  15 mL Mouth Rinse BID  . digoxin  0.125 mg Intravenous Daily  . heparin  850 Units Intravenous Once  . insulin aspart  0-15 Units Subcutaneous Q4H  . mouth rinse  15 mL Mouth Rinse q12n4p  . metoprolol tartrate  5 mg Intravenous Q6H  . sodium chloride flush  10-40 mL Intracatheter Q12H   Infusions:  . Marland KitchenTPN (CLINIMIX-E) Adult 83 mL/hr at 01/03/18 1810  . amiodarone 30 mg/hr (01/03/18 1800)  . diltiazem (CARDIZEM) infusion 15 mg/hr (01/03/18 2143)   . famotidine (PEPCID) IV Stopped (01/03/18 1050)  . heparin 1,100 Units/hr (01/03/18 1800)  . levofloxacin (LEVAQUIN) IV Stopped (01/03/18 1101)  . metronidazole 500 mg (01/03/18 2143)  . sodium chloride      Assessment: Pharmacy consulted for heparin drip management for 77 yo female admitted to ICU with perforated bowel. Patient with chronic atrial fibrillation on apixaban as an outpatient. Patient has not been ordered apixaban this admission. Patient ordered enoxaparin 40mg  SQ Daily with last dose at 0915. Patient CHA2DS2/VAS of at least 4 (Age, Gender, HTN).   Heparin infusing at 750 units/hr 7/30 0700 Heparin level resulted at 0.13  Goal of Therapy:  Heparin level 0.3-0.7 units/ml Monitor platelets by anticoagulation protocol: Yes   Plan:  Will order Heparin 1800 units IV bolus and increase drip rate to 900 units/hr. Check Heparin level in 8 hours. Daily CBC while on Heparin drip.  01/03/18 17:47 HL subtherapeutic x 1. 1800 units Iv x bolus x 1 and increase rate to 1100 units/hr. Will recheck HL in 8 hours.  07/31 @ 0200 HL 0.28 subtherapeutic. Will rebolus w/ heparin 850 units IV x 1 and increase rate to 1200 units/hr and will recheck anti-Xa @ 1100  Pharmacy will continue to monitor and adjust per consult.   Tobie Lords, PharmD,  BCPS Clinical Pharmacist 01/04/2018

## 2018-01-05 ENCOUNTER — Encounter: Payer: Self-pay | Admitting: Surgery

## 2018-01-05 ENCOUNTER — Inpatient Hospital Stay: Payer: Medicare Other | Admitting: Anesthesiology

## 2018-01-05 ENCOUNTER — Encounter
Admission: EM | Disposition: A | Discharge status: Home Health | Payer: Self-pay | Source: Home / Self Care | Attending: Surgery

## 2018-01-05 DIAGNOSIS — K5720 Diverticulitis of large intestine with perforation and abscess without bleeding: Secondary | ICD-10-CM

## 2018-01-05 HISTORY — PX: LAPAROTOMY: SHX154

## 2018-01-05 HISTORY — PX: LYSIS OF ADHESION: SHX5961

## 2018-01-05 HISTORY — PX: COLOSTOMY: SHX63

## 2018-01-05 LAB — BLOOD GAS, ARTERIAL
Acid-base deficit: 1.3 mmol/L (ref 0.0–2.0)
Acid-base deficit: 0.9 mmol/L (ref 0.0–2.0)
BICARBONATE: 23.5 mmol/L (ref 20.0–28.0)
Bicarbonate: 26.8 mmol/L (ref 20.0–28.0)
FIO2: 0.4
FIO2: 40
MECHANICAL RATE: 20
MECHVT: 450 mL
MECHVT: 550 mL
O2 SAT: 90.6 %
O2 SAT: 97.4 %
PATIENT TEMPERATURE: 37
PCO2 ART: 37 mmHg (ref 32.0–48.0)
PEEP: 5 cmH2O
PH ART: 7.25 — AB (ref 7.350–7.450)
PO2 ART: 94 mmHg (ref 83.0–108.0)
Patient temperature: 37
RATE: 15 resp/min
pCO2 arterial: 61 mmHg — ABNORMAL HIGH (ref 32.0–48.0)
pH, Arterial: 7.41 (ref 7.350–7.450)
pO2, Arterial: 70 mmHg — ABNORMAL LOW (ref 83.0–108.0)

## 2018-01-05 LAB — COMPREHENSIVE METABOLIC PANEL
ALK PHOS: 48 U/L (ref 38–126)
ALT: 10 U/L (ref 0–44)
AST: 12 U/L — AB (ref 15–41)
Albumin: 2.1 g/dL — ABNORMAL LOW (ref 3.5–5.0)
Anion gap: 5 (ref 5–15)
BUN: 20 mg/dL (ref 8–23)
CALCIUM: 7.7 mg/dL — AB (ref 8.9–10.3)
CHLORIDE: 99 mmol/L (ref 98–111)
CO2: 29 mmol/L (ref 22–32)
CREATININE: 0.51 mg/dL (ref 0.44–1.00)
GFR calc Af Amer: >60 mL/min (ref >60)
Glucose, Bld: 198 mg/dL — ABNORMAL HIGH (ref 70–99)
Potassium: 4 mmol/L (ref 3.5–5.1)
SODIUM: 133 mmol/L — AB (ref 135–145)
Total Bilirubin: 0.3 mg/dL (ref 0.3–1.2)
Total Protein: 4.6 g/dL — ABNORMAL LOW (ref 6.5–8.1)

## 2018-01-05 LAB — GLUCOSE, CAPILLARY
GLUCOSE-CAPILLARY: 252 mg/dL — AB (ref 70–99)
GLUCOSE-CAPILLARY: 319 mg/dL — AB (ref 70–99)
Glucose-Capillary: 181 mg/dL — ABNORMAL HIGH (ref 70–99)
Glucose-Capillary: 177 mg/dL — ABNORMAL HIGH (ref 70–99)
Glucose-Capillary: 123 mg/dL — ABNORMAL HIGH (ref 70–99)
Glucose-Capillary: 185 mg/dL — ABNORMAL HIGH (ref 70–99)

## 2018-01-05 LAB — CBC WITH DIFFERENTIAL/PLATELET
BASOS ABS: 0.2 10*3/uL — AB (ref 0–0.1)
Basophils Relative: 1 %
EOS PCT: 1 %
Eosinophils Absolute: 0.1 10*3/uL (ref 0–0.7)
HCT: 32.1 % — ABNORMAL LOW (ref 35.0–47.0)
Hemoglobin: 10.5 g/dL — ABNORMAL LOW (ref 12.0–16.0)
LYMPHS ABS: 0.4 10*3/uL — AB (ref 1.0–3.6)
LYMPHS PCT: 2 %
MCH: 26.8 pg (ref 26.0–34.0)
MCHC: 32.6 g/dL (ref 32.0–36.0)
MCV: 82.2 fL (ref 80.0–100.0)
Monocytes Absolute: 0.9 10*3/uL (ref 0.2–0.9)
Monocytes Relative: 5 %
NEUTROS ABS: 16 10*3/uL — AB (ref 1.4–6.5)
NEUTROS PCT: 91 %
PLATELETS: 268 10*3/uL (ref 150–440)
RBC: 3.91 MIL/uL (ref 3.80–5.20)
RDW: 16.3 % — ABNORMAL HIGH (ref 11.5–14.5)
WBC: 17.5 10*3/uL — AB (ref 3.6–11.0)

## 2018-01-05 LAB — PHOSPHORUS: PHOSPHORUS: 2.7 mg/dL (ref 2.5–4.6)

## 2018-01-05 LAB — HEPARIN LEVEL (UNFRACTIONATED): Heparin Unfractionated: 0.21 IU/mL — ABNORMAL LOW (ref 0.30–0.70)

## 2018-01-05 LAB — MAGNESIUM: Magnesium: 2 mg/dL (ref 1.7–2.4)

## 2018-01-05 SURGERY — LAPAROTOMY, EXPLORATORY
Anesthesia: General | Wound class: Contaminated

## 2018-01-05 MED ORDER — FENTANYL CITRATE (PF) 100 MCG/2ML IJ SOLN
INTRAMUSCULAR | Status: AC
  Filled 2018-01-05: qty 2

## 2018-01-05 MED ORDER — SODIUM CHLORIDE 0.9 % IV SOLN
1.0000 g | INTRAVENOUS | Status: DC
  Administered 2018-01-05: 1000 mg via INTRAVENOUS
  Filled 2018-01-05: qty 1

## 2018-01-05 MED ORDER — DEXTROSE-NACL 5-0.45 % IV SOLN
INTRAVENOUS | Status: DC | PRN
  Administered 2018-01-05: 10:00:00 via INTRAVENOUS

## 2018-01-05 MED ORDER — LACTATED RINGERS IV BOLUS
1000.0000 mL | Freq: Once | INTRAVENOUS | Status: AC
  Administered 2018-01-05: 1000 mL via INTRAVENOUS

## 2018-01-05 MED ORDER — SODIUM CHLORIDE 0.9 % IV SOLN
INTRAVENOUS | Status: DC | PRN
  Administered 2018-01-05 (×2): via INTRAVENOUS

## 2018-01-05 MED ORDER — CHLORHEXIDINE GLUCONATE 0.12% ORAL RINSE (MEDLINE KIT)
15.0000 mL | Freq: Two times a day (BID) | OROMUCOSAL | Status: DC
  Administered 2018-01-05 – 2018-01-06 (×2): 15 mL via OROMUCOSAL

## 2018-01-05 MED ORDER — ONDANSETRON HCL 4 MG/2ML IJ SOLN
INTRAMUSCULAR | Status: AC
  Filled 2018-01-05: qty 2

## 2018-01-05 MED ORDER — FENTANYL 2500MCG IN NS 250ML (10MCG/ML) PREMIX INFUSION
0.0000 ug/h | INTRAVENOUS | Status: DC
Start: 2018-01-05 — End: 2018-01-09
  Administered 2018-01-05: 25 ug/h via INTRAVENOUS
  Administered 2018-01-05: 275 ug/h via INTRAVENOUS
  Administered 2018-01-06 – 2018-01-07 (×3): 250 ug/h via INTRAVENOUS
  Filled 2018-01-05 (×5): qty 250

## 2018-01-05 MED ORDER — MIDAZOLAM HCL 2 MG/2ML IJ SOLN
INTRAMUSCULAR | Status: AC
  Filled 2018-01-05: qty 2

## 2018-01-05 MED ORDER — ROCURONIUM BROMIDE 50 MG/5ML IV SOLN
INTRAVENOUS | Status: AC
Start: 2018-01-05 — End: ?
  Filled 2018-01-05: qty 1

## 2018-01-05 MED ORDER — FENTANYL CITRATE (PF) 100 MCG/2ML IJ SOLN
25.0000 ug | INTRAMUSCULAR | Status: DC | PRN
  Administered 2018-01-08 – 2018-01-09 (×2): 25 ug via INTRAVENOUS
  Filled 2018-01-05 (×3): qty 2

## 2018-01-05 MED ORDER — PROPOFOL 10 MG/ML IV BOLUS
INTRAVENOUS | Status: AC
  Filled 2018-01-05: qty 20

## 2018-01-05 MED ORDER — PHENYLEPHRINE HCL 10 MG/ML IJ SOLN
INTRAMUSCULAR | Status: AC
  Filled 2018-01-05: qty 1

## 2018-01-05 MED ORDER — BUPIVACAINE HCL 0.25 % IJ SOLN
INTRAMUSCULAR | Status: DC | PRN
  Administered 2018-01-05: 20 mL

## 2018-01-05 MED ORDER — FAT EMULSION PLANT BASED 20 % IV EMUL
250.0000 mL | INTRAVENOUS | Status: AC
  Administered 2018-01-05: 250 mL via INTRAVENOUS
  Filled 2018-01-05: qty 250

## 2018-01-05 MED ORDER — NOREPINEPHRINE 4 MG/250ML-% IV SOLN
0.0000 ug/min | INTRAVENOUS | Status: DC
  Administered 2018-01-05: 10 ug/min via INTRAVENOUS
  Administered 2018-01-05: 12 ug/min via INTRAVENOUS
  Administered 2018-01-06: 13 ug/min via INTRAVENOUS
  Administered 2018-01-06: 8 ug/min via INTRAVENOUS
  Administered 2018-01-06: 4 ug/min via INTRAVENOUS
  Filled 2018-01-05 (×5): qty 250

## 2018-01-05 MED ORDER — SODIUM CHLORIDE 0.9 % IV SOLN
1.0000 g | INTRAVENOUS | Status: DC
  Administered 2018-01-06 – 2018-01-11 (×6): 1000 mg via INTRAVENOUS
  Filled 2018-01-05 (×7): qty 1

## 2018-01-05 MED ORDER — SODIUM CHLORIDE 0.9 % IV SOLN
INTRAVENOUS | Status: DC | PRN
  Administered 2018-01-05: 70 mL

## 2018-01-05 MED ORDER — PROPOFOL 10 MG/ML IV BOLUS
INTRAVENOUS | Status: DC | PRN
  Administered 2018-01-05: 100 mg via INTRAVENOUS

## 2018-01-05 MED ORDER — FENTANYL CITRATE (PF) 100 MCG/2ML IJ SOLN
INTRAMUSCULAR | Status: DC | PRN
  Administered 2018-01-05: 100 ug via INTRAVENOUS
  Administered 2018-01-05: 50 ug via INTRAVENOUS

## 2018-01-05 MED ORDER — PHENYLEPHRINE HCL 10 MG/ML IJ SOLN
INTRAMUSCULAR | Status: DC | PRN
  Administered 2018-01-05: 50 ug via INTRAVENOUS
  Administered 2018-01-05 (×4): 100 ug via INTRAVENOUS
  Administered 2018-01-05 (×2): 50 ug via INTRAVENOUS
  Administered 2018-01-05: 200 ug via INTRAVENOUS
  Administered 2018-01-05: 100 ug via INTRAVENOUS
  Administered 2018-01-05: 150 ug via INTRAVENOUS
  Administered 2018-01-05: 100 ug via INTRAVENOUS
  Administered 2018-01-05: 50 ug via INTRAVENOUS
  Administered 2018-01-05 (×2): 100 ug via INTRAVENOUS
  Administered 2018-01-05: 50 ug via INTRAVENOUS

## 2018-01-05 MED ORDER — ALBUMIN HUMAN 5 % IV SOLN
INTRAVENOUS | Status: DC | PRN
  Administered 2018-01-05: 13:00:00 via INTRAVENOUS

## 2018-01-05 MED ORDER — ONDANSETRON HCL 4 MG/2ML IJ SOLN: 4.0000 mg | Freq: Once | INTRAMUSCULAR | Status: DC | PRN

## 2018-01-05 MED ORDER — VASOPRESSIN 20 UNIT/ML IV SOLN
INTRAVENOUS | Status: DC | PRN
  Administered 2018-01-05 (×7): 1 [IU] via INTRAVENOUS

## 2018-01-05 MED ORDER — ORAL CARE MOUTH RINSE
15.0000 mL | OROMUCOSAL | Status: DC
  Administered 2018-01-05 – 2018-01-07 (×17): 15 mL via OROMUCOSAL

## 2018-01-05 MED ORDER — ROCURONIUM BROMIDE 100 MG/10ML IV SOLN
INTRAVENOUS | Status: DC | PRN
  Administered 2018-01-05: 10 mg via INTRAVENOUS
  Administered 2018-01-05: 20 mg via INTRAVENOUS
  Administered 2018-01-05: 10 mg via INTRAVENOUS
  Administered 2018-01-05: 20 mg via INTRAVENOUS
  Administered 2018-01-05: 50 mg via INTRAVENOUS

## 2018-01-05 MED ORDER — ROCURONIUM BROMIDE 50 MG/5ML IV SOLN
INTRAVENOUS | Status: AC
  Filled 2018-01-05: qty 1

## 2018-01-05 MED ORDER — SUCCINYLCHOLINE CHLORIDE 20 MG/ML IJ SOLN
INTRAMUSCULAR | Status: DC | PRN
  Administered 2018-01-05: 100 mg via INTRAVENOUS

## 2018-01-05 MED ORDER — SUCCINYLCHOLINE CHLORIDE 20 MG/ML IJ SOLN
INTRAMUSCULAR | Status: AC
  Filled 2018-01-05: qty 1

## 2018-01-05 MED ORDER — MIDAZOLAM HCL 2 MG/2ML IJ SOLN
INTRAMUSCULAR | Status: DC | PRN
  Administered 2018-01-05: 2 mg via INTRAVENOUS

## 2018-01-05 MED ORDER — SODIUM CHLORIDE 0.9 % IV SOLN
INTRAVENOUS | Status: DC | PRN
  Administered 2018-01-05: 20 ug/min via INTRAVENOUS

## 2018-01-05 MED ORDER — TRACE MINERALS CR-CU-MN-SE-ZN 10-1000-500-60 MCG/ML IV SOLN
INTRAVENOUS | Status: AC
  Administered 2018-01-05: 18:00:00 via INTRAVENOUS
  Filled 2018-01-05: qty 1992

## 2018-01-05 MED ORDER — ALBUMIN HUMAN 5 % IV SOLN
INTRAVENOUS | Status: AC
  Filled 2018-01-05: qty 250

## 2018-01-05 MED ORDER — LIDOCAINE HCL (PF) 2 % IJ SOLN
INTRAMUSCULAR | Status: AC
  Filled 2018-01-05: qty 10

## 2018-01-05 MED ORDER — SODIUM CHLORIDE 0.9 % IJ SOLN
INTRAMUSCULAR | Status: AC
  Filled 2018-01-05: qty 50

## 2018-01-05 MED ORDER — LIDOCAINE HCL (CARDIAC) PF 100 MG/5ML IV SOSY
PREFILLED_SYRINGE | INTRAVENOUS | Status: DC | PRN
  Administered 2018-01-05: 100 mg via INTRAVENOUS

## 2018-01-05 MED ORDER — ONDANSETRON HCL 4 MG/2ML IJ SOLN
INTRAMUSCULAR | Status: DC | PRN
  Administered 2018-01-05: 4 mg via INTRAVENOUS

## 2018-01-05 MED ORDER — LACTATED RINGERS IV SOLN
INTRAVENOUS | Status: DC
  Administered 2018-01-05: 16:00:00 via INTRAVENOUS
  Administered 2018-01-05 – 2018-01-06 (×2): 125 mL/h via INTRAVENOUS

## 2018-01-05 SURGICAL SUPPLY — 37 items
BLADE BOVIE TIP EXT 4 (BLADE) ×3 IMPLANT
CANISTER PREVENA 45 (CANNISTER) ×3 IMPLANT
CANISTER SUCT 1200ML W/VALVE (MISCELLANEOUS) ×3 IMPLANT
CHLORAPREP W/TINT 26ML (MISCELLANEOUS) ×3 IMPLANT
DRAPE LAPAROTOMY 100X77 ABD (DRAPES) ×3 IMPLANT
DRAPE UTILITY 15X26 TOWEL STRL (DRAPES) ×6 IMPLANT
DRSG OPSITE POSTOP 4X10 (GAUZE/BANDAGES/DRESSINGS) IMPLANT
DRSG OPSITE POSTOP 4X8 (GAUZE/BANDAGES/DRESSINGS) IMPLANT
DRSG TEGADERM 4X10 (GAUZE/BANDAGES/DRESSINGS) ×3 IMPLANT
DRSG TELFA 3X8 NADH (GAUZE/BANDAGES/DRESSINGS) ×3 IMPLANT
ELECT REM PT RETURN 9FT ADLT (ELECTROSURGICAL) ×3
ELECTRODE REM PT RTRN 9FT ADLT (ELECTROSURGICAL) ×1 IMPLANT
GLOVE BIOGEL PI IND STRL 7.0 (GLOVE) ×3 IMPLANT
GLOVE BIOGEL PI INDICATOR 7.0 (GLOVE) ×6
GLOVE PROTEXIS LATEX SZ 7.5 (GLOVE) ×6 IMPLANT
GLOVE SURG SYN 6.5 ES PF (GLOVE) ×9 IMPLANT
GOWN STRL REUS W/ TWL LRG LVL3 (GOWN DISPOSABLE) ×3 IMPLANT
GOWN STRL REUS W/TWL LRG LVL3 (GOWN DISPOSABLE) ×6
KIT TURNOVER KIT A (KITS) ×3 IMPLANT
LABEL OR SOLS (LABEL) ×3 IMPLANT
LIGASURE IMPACT 36 18CM CVD LR (INSTRUMENTS) ×3 IMPLANT
NS IRRIG 1000ML POUR BTL (IV SOLUTION) ×6 IMPLANT
PACK BASIN MAJOR ARMC (MISCELLANEOUS) ×3 IMPLANT
PACK COLON CLEAN CLOSURE (MISCELLANEOUS) ×3 IMPLANT
RELOAD PROXIMATE 75MM GREEN (ENDOMECHANICALS) ×6 IMPLANT
SPONGE LAP 18X18 RF (DISPOSABLE) ×3 IMPLANT
STAPLER CUT CVD 40MM GREEN (STAPLE) ×3 IMPLANT
STAPLER PROXIMATE 75MM BLUE (STAPLE) ×3 IMPLANT
SUT PDS AB 1 TP1 54 (SUTURE) ×6 IMPLANT
SUT SILK 2 0 (SUTURE) ×2
SUT SILK 2-0 18XBRD TIE 12 (SUTURE) ×1 IMPLANT
SUT SILK 3 0 (SUTURE) ×2
SUT SILK 3-0 (SUTURE) ×3 IMPLANT
SUT SILK 3-0 18XBRD TIE 12 (SUTURE) ×1 IMPLANT
SUT VIC AB 3-0 SH 27 (SUTURE) ×4
SUT VIC AB 3-0 SH 27X BRD (SUTURE) ×2 IMPLANT
TRAY FOLEY MTR SLVR 16FR STAT (SET/KITS/TRAYS/PACK) ×3 IMPLANT

## 2018-01-05 NOTE — Progress Notes (Signed)
PHARMACY - ADULT TOTAL PARENTERAL NUTRITION CONSULT NOTE   Pharmacy Consult for TPN Management  Indication: colon perforation, NPO Status   Patient Measurements: Height: 4\' 11"  (149.9 cm) Weight: 187 lb 13.3 oz (85.2 kg) IBW/kg (Calculated) : 43.2 TPN AdjBW (KG): 50.5 Body mass index is 37.94 kg/m.   Assessment:   Pharmacy consulted for TPN management for 77 yo female with diffuse inflammation on distal jejunum extending to sigmoid colon with perforation. Patient has been NPO since Sunday. Patient is NPO and at risk for refeeding.  TPN Access: PICC Triple Lumen  TPN start date: 12/30/17 Nutritional Goals (per RD recommendation on 7/26): KCal: 1500-1700 kcal  Protein: 87-102 grams  Fluid: > 1.5 L Goal TPN rate is 83 ml/hr (provides 1414 kcal and 100 grams of protein)  Current Nutrition:   Plan:  Clinimix E 5/15 at 39mL/hr on 7/29. ILE 20% at 1mL/hr.   Add MVI, trace elements to TPN  Patient's TPN stopped during surgery. Will continue with SSI. Will readdress on 8/2.   Monitor TPN labs. Blood Glucose.   Pharmacy will continue to monitor and adjust per consult.   MLS 01/05/2018 8:06 PM

## 2018-01-05 NOTE — Progress Notes (Signed)
PT Cancellation Note  Patient Details Name: RHETA HEMMELGARN MRN: [id number] DOB: December 10, 1940   Cancelled Treatment:    Reason Eval/Treat Not Completed: Medical issues which prohibited therapy(Patient currently off unit for exploratory laparotomy. Per policy, will require new orders to resume PT services as medically appropriate.  Please re-consult as appropriate.)   Azaryah Oleksy H. Owens Shark, PT, DPT, NCS 01/05/18, 11:51 AM [phone]

## 2018-01-05 NOTE — Anesthesia Post-op Follow-up Note (Signed)
Anesthesia QCDR form completed.        

## 2018-01-05 NOTE — Progress Notes (Signed)
CRITICAL CARE NOTE  CC  follow up perforated bowel secondary to diverticulitis and chronic atrial fibrillation  SUBJECTIVE Patient remains critically ill Prognosis is guarded  77 year old female admitted on 7/26 for perforated bowel secondary to diverticulitis. Patient with surgery scheduled this morning. She tells me she didn't sleep well last night because she is anxious. Overall, seems in good spirits. NG tube placed yesterday, nausea and vomiting has improved.    SIGNIFICANT EVENTS  CT scan showed continued progressive dilation of bowel with possible SBO along with no improvement in abscesses. Scheduled for exploratory laparotomy today with possible ostomy.   BP 132/77   Pulse 100   Temp 98.3 F (36.8 C) (Oral)   Resp (!) 34   Ht 4\' 11"  (1.499 m)   Wt 85.2 kg (187 lb 13.3 oz)   SpO2 94%   BMI 37.94 kg/m    REVIEW OF SYSTEMS  PATIENT IS UNABLE TO PROVIDE COMPLETE REVIEW OF SYSTEM S DUE TO SEVERE CRITICAL ILLNESS AND ENCEPHALOPATHY   PHYSICAL EXAMINATION:  GENERAL:critically ill appearing, -resp distress HEAD: Normocephalic, atraumatic.  EYES: Pupils equal, round, reactive to light.  No scleral icterus.  MOUTH: Moist mucosal membrane. NECK: Supple. No thyromegaly. No nodules. No JVD.  PULMONARY: clear to auscultation bilaterally CARDIOVASCULAR: S1 and S2. Irregular rate and rhythm. No murmurs, rubs, or gallops. 2+ dorsalis pedis pulses bilaterally GASTROINTESTINAL: Moderate distention with mild tenderness to palpation in LLQ. Absent bowel sounds. No hepatosplenomegaly.  MUSCULOSKELETAL: No swelling, clubbing, or edema.  NEUROLOGIC: alert and oriented x 3, appears anxious SKIN:intact,warm,dry  ASSESSMENT AND PLAN SYNOPSIS  77 year old female with perforated bowel secondary to diverticulitis with chronic atrial fibrillation. CT scan yesterday showed continued dilation of bowel with SBO and little improvement on abscesses. Patient is scheduled for exploratory  laparotomy with possible ostomy formation this morning.  Patient is status DO NOT RESUSCITATE/DO NOT INTUBATE  GASTROINTESTINAL: -perforated bowel secondary to diverticulitis with SBO -exploratory laparotomy with possible ostomy formation -NG tube placed yesterday: AXR confirmed placement   PULMONARY -bilateral pleural effusions with atelectasis -patient with O2 sat 94% on 2L of O2 -Consider diuresis after surgery: Patient +10L   AKI with lactic acidosis: -resolved: creatinine 0.51 -electrolytes stable  -hyponatremia: Improving 132-->133  NEUROLOGY - acute encephalopathy secondary to abdominal infection -continuing to improve   CARDIAC -chronic atrial fibrillation -patient remains in a fib this morning; amiodarone drip, diltiazem drip, digoxin, metoprolol -heparin drip held for surgery   ID -continue IV abx as prescribed: levaquin and flagyl -leukocytosis trending down 19.9-->17.5 -afebrile   DVT: SCD, heparin held GI PRX: pepcid TRANSFUSIONS AS NEEDED MONITOR FSBS ASSESS the need for LABS as needed   Critical Care Time devoted to patient care services described in this note is 30 minutes.   Overall, patient is critically ill, prognosis is guarded.  Patient with Multiorgan failure and at high risk for cardiac arrest and death.

## 2018-01-05 NOTE — Progress Notes (Signed)
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   Vent orders and sedation ordered.  Follow up surgery recs Plan for SAT/SBT when able     Corrin Parker, M.D.  [facility]  Medical Director [place] Director [facility] Cardio-Pulmonary Department

## 2018-01-05 NOTE — Anesthesia Procedure Notes (Signed)
Procedure Name: Intubation Performed by: Lance Muss, CRNA Pre-anesthesia Checklist: Patient identified, Patient being monitored, Timeout performed, Emergency Drugs available and Suction available Patient Re-evaluated:Patient Re-evaluated prior to induction Oxygen Delivery Method: Circle system utilized Preoxygenation: Pre-oxygenation with 100% oxygen Induction Type: IV induction, Cricoid Pressure applied and Rapid sequence Ventilation: Mask ventilation without difficulty Laryngoscope Size: Mac and 3 Grade View: Grade I Tube type: Oral Tube size: 7.0 mm Number of attempts: 1 Airway Equipment and Method: Stylet Placement Confirmation: ETT inserted through vocal cords under direct vision,  positive ETCO2 and breath sounds checked- equal and bilateral Secured at: 21 cm Tube secured with: Tape Dental Injury: Teeth and Oropharynx as per pre-operative assessment

## 2018-01-05 NOTE — Anesthesia Preprocedure Evaluation (Signed)
Anesthesia Evaluation  Patient identified by MRN, date of birth, ID band Patient awake    Reviewed: Allergy & Precautions, H&P , NPO status , Patient's Chart, lab work & pertinent test results, reviewed documented beta blocker date and time   Airway Mallampati: III  TM Distance: >3 FB Neck ROM: full    Dental  (+) Teeth Intact   Pulmonary neg pulmonary ROS, shortness of breath, at rest and lying, asthma ,    Pulmonary exam normal        Cardiovascular Exercise Tolerance: Poor hypertension, On Medications negative cardio ROS Normal cardiovascular exam Rhythm:regular Rate:Normal     Neuro/Psych PSYCHIATRIC DISORDERS Anxiety Depression negative neurological ROS  negative psych ROS   GI/Hepatic negative GI ROS, Neg liver ROS,   Endo/Other  negative endocrine ROS  Renal/GU negative Renal ROS  negative genitourinary   Musculoskeletal   Abdominal   Peds  Hematology negative hematology ROS (+)   Anesthesia Other Findings Past Medical History: No date: Asthma No date: Atrial fibrillation ([facility]) No date: Lung cancer ([place])     Comment:  10 yrs ago History reviewed. No pertinent surgical history. BMI    Body Mass Index:  37.94 kg/m     Reproductive/Obstetrics negative OB ROS                             Anesthesia Physical Anesthesia Plan  ASA: III and emergent  Anesthesia Plan: General ETT   Post-op Pain Management:    Induction:   PONV Risk Score and Plan:   Airway Management Planned:   Additional Equipment:   Intra-op Plan:   Post-operative Plan:   Informed Consent: I have reviewed the patients History and Physical, chart, labs and discussed the procedure including the risks, benefits and alternatives for the proposed anesthesia with the patient or authorized representative who has indicated his/her understanding and acceptance.   Dental Advisory Given  Plan Discussed with:  CRNA  Anesthesia Plan Comments:         Anesthesia Quick Evaluation

## 2018-01-05 NOTE — Op Note (Signed)
Preoperative diagnosis: Bowel perforation  Postoperative diagnosis: Sigmoid/ascending colon Diverticulitis with perforation.  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   Anesthesia: GETA  Surgeon: Benjamine Sprague, DO  Assistant: Nestor Lewandowsky, MD  Wound Classification: Dirty  Indications:  Patient is a 77 y.o. female with admitted For perforated bowel, small bowel vs diverticulitis.  Abx management failed so presenting to OR.  Description of procedure:  The patient was placed in the supine position and general endotracheal anesthesia was induced. A time-out was completed verifying correct patient, procedure, site, positioning, and implant(s) and/or special equipment prior to beginning this procedure. Preoperative antibiotics were continued from floor. The abdomen was prepped and draped in the usual sterile fashion. A vertical midline incision was made from mid epigastric just above the pubis. This was deepened through the subcutaneous tissues and hemostasis was achieved with electrocautery. The linea alba was identified and incised and the peritoneal cavity entered. The abdomen was explored. Multiple areas of purulent pockets enclosed by dense omental, bowel to bowel, and bowel to mesentary adhesions were disrupted and drained completely.  Adhesions were lysed sharply under direct vision with Metzenbaum scissors or with blunt dissection.  The small bowel was inspected from IC junction to ligament of treitz after all loculations disrupted.  Sections of thick abscess capsules noted to be attached to walls of the jejunum, but no obvious perforation or wall compromise was noted.  Further inspection of the sigmoid colon noted to have dense adhesions to surrounding organs, including bladder, uterus, fallopinan tubes.  Decision at this point was made to proceed with Hartman's  procedure since the sigmoid area seemed the most likely area of perforation.  The entire descending and sigmoid colon itself was thickened with evidence of multiple diverticuli.  Small bowel retracted to the right using a moist towel and self-retaining retractor. Using electrocautery, the colon was freed from its peritoneal attachments along the line of Toldt proximally from the splenic flexure and distally to the pelvic inlet.  Care was noted to separate all viable structures from the dense abscess capsules.  Any bleeding not controlled by electrocautry was tied with 3-0 silk.  Both ureters were identified and protected. Omentum was freed from the transverse colon and the splenic flexure was mobilized.  Points of transection were selected proximally at the transverse colon and distally at the rectum. The bowel was divided with the linear cutting stapler. Ligasure used to divide the mesentery close to the colon. The specimen was removed and sent to pathology. The abdominal cavity was then copiously irrigated until clear lfluid return noted and hemostasis was checked.  The proximal colon reached easily to the proposed colostomy site on left abdominal wall without tension. A disk of skin was removed from the colostomy site and the incision was deepened through all layers of the abdominal wall and dilated to admit two fingers. The colon was passed out through the ostomy site without torsion or tension.    The Hartmann's pouch was tagged with two long sutures of 2-0 prolene and allowed to fall into the pelvis.  A clean closure protocol initiated and the midline incisoin fascia was closed with running suture of PDS 0. The skin was closed with skin staples and dressed with provena incisional vac.  The colostomy was matured with multiple interrupted sutures of 3-0 Vicryl. An ostomy bag was applied.  The patient tolerated the procedure well and was taken to directly to the  ICU intubated per anesthesia plan prior to  operation.  Specimen: left colon  Complications: None apparent  EBL: 150 mL

## 2018-01-05 NOTE — Progress Notes (Signed)
Post op:  Subjective:    Jody [name] is a 77 y.o. female  Hospital stay day 9, Day of Surgery exlap, LOA, abdominal washout, hartman's Currently still on vent, levophed added for labile BP.  S/p 1L bolus and albumin from OR.   Objective:      Temp:  [97.8 F (36.6 C)-98.3 F (36.8 C)] 97.8 F (36.6 C) (08/01 1557) Pulse Rate:  [57-125] 83 (08/01 1557) Resp:  [15-35] 20 (08/01 1557) BP: (60-135)/(37-82) 99/58 (08/01 1545) SpO2:  [90 %-97 %] 97 % (08/01 1557) FiO2 (%):  [40 %] 40 % (08/01 1455) Weight:  [85.2 kg (187 lb 13.3 oz)] 85.2 kg (187 lb 13.3 oz) (08/01 0500)     Height: 4\' 11"  (149.9 cm) Weight: 85.2 kg (187 lb 13.3 oz) BMI (Calculated): 37.92   Intake/Output this shift:  Total I/O In: 3593.7 [I.V.:3543.7; IV Piggyback:50] Out: 1250 [Urine:450; Emesis/NG output:200; Other:150; Blood:450]  NG with bilious output     Constitutional :  sedated, on vent        Gastrointestinal: soft, tender to palpation as expected, incision vac in place.  ostomy with no output but pink mucosa.   Skin: Cool and moist.   Psychiatric: Normal affect, non-agitated, not confused        Assessment:   exlap, LOA, abdominal washout, hartman's  IVF: LR@125ml , recommended bolus as needed for labile BP. Abx: switch to invanz per ID recs.  Monitory wbc DVT prophy: hold for tonight. GI prophy: pepcid

## 2018-01-05 NOTE — Transfer of Care (Signed)
Immediate Anesthesia Transfer of Care Note  Patient: Jody [name]  Procedure(s) Performed: EXPLORATORY LAPAROTOMY/ POSSIBLE BOWEL OBSTRUCTION (N/A ) LYSIS OF ADHESION (N/A ) COLOSTOMY (N/A )  Patient Location: PACU  Anesthesia Type:General  Level of Consciousness: sedated  Airway & Oxygen Therapy: Patient remains intubated per anesthesia plan and Patient placed on Ventilator (see vital sign flow sheet for setting)  Post-op Assessment: Report given to RN and Post -op Vital signs reviewed and stable  Post vital signs: Reviewed and stable  Last Vitals:  Vitals Value Taken Time  BP 120/82 01/05/2018  1:30 PM  Temp    Pulse 91 01/05/2018  1:30 PM  Resp 17 01/05/2018  1:30 PM  SpO2 92 % 01/05/2018  1:30 PM    Last Pain:  Vitals:   01/05/18 0748  TempSrc: Axillary  PainSc: 0-No pain      Patients Stated Pain Goal: 0 (32/35/57 3220)  Complications: No apparent anesthesia complications

## 2018-01-06 DIAGNOSIS — J9601 Acute respiratory failure with hypoxia: Secondary | ICD-10-CM

## 2018-01-06 LAB — GLUCOSE, CAPILLARY
GLUCOSE-CAPILLARY: 190 mg/dL — AB (ref 70–99)
GLUCOSE-CAPILLARY: 209 mg/dL — AB (ref 70–99)
GLUCOSE-CAPILLARY: 239 mg/dL — AB (ref 70–99)
GLUCOSE-CAPILLARY: 297 mg/dL — AB (ref 70–99)
Glucose-Capillary: 311 mg/dL — ABNORMAL HIGH (ref 70–99)
Glucose-Capillary: 184 mg/dL — ABNORMAL HIGH (ref 70–99)

## 2018-01-06 LAB — HEPARIN LEVEL (UNFRACTIONATED): HEPARIN UNFRACTIONATED: 0.21 [IU]/mL — AB (ref 0.30–0.70)

## 2018-01-06 LAB — CBC WITH DIFFERENTIAL/PLATELET
Basophils Absolute: 0 10*3/uL (ref 0–0.1)
Basophils Relative: 0 %
Eosinophils Absolute: 0 10*3/uL (ref 0–0.7)
Eosinophils Relative: 0 %
HCT: 27.2 % — ABNORMAL LOW (ref 35.0–47.0)
Hemoglobin: 9.1 g/dL — ABNORMAL LOW (ref 12.0–16.0)
LYMPHS PCT: 1 %
Lymphs Abs: 0.4 10*3/uL — ABNORMAL LOW (ref 1.0–3.6)
MCH: 27.4 pg (ref 26.0–34.0)
MCHC: 33.7 g/dL (ref 32.0–36.0)
MCV: 81.5 fL (ref 80.0–100.0)
MONO ABS: 1.7 10*3/uL — AB (ref 0.2–0.9)
Monocytes Relative: 5 %
Neutro Abs: 29.6 10*3/uL — ABNORMAL HIGH (ref 1.4–6.5)
Neutrophils Relative %: 94 %
Platelets: 379 10*3/uL (ref 150–440)
RBC: 3.33 MIL/uL — ABNORMAL LOW (ref 3.80–5.20)
RDW: 16.6 % — AB (ref 11.5–14.5)
WBC: 31.8 10*3/uL — ABNORMAL HIGH (ref 3.6–11.0)

## 2018-01-06 LAB — MAGNESIUM: Magnesium: 1.8 mg/dL (ref 1.7–2.4)

## 2018-01-06 LAB — PHOSPHORUS: Phosphorus: 2.7 mg/dL (ref 2.5–4.6)

## 2018-01-06 MED ORDER — HYDROCORTISONE NA SUCCINATE PF 100 MG IJ SOLR
100.0000 mg | Freq: Three times a day (TID) | INTRAMUSCULAR | Status: DC
  Administered 2018-01-06 – 2018-01-08 (×6): 100 mg via INTRAVENOUS
  Filled 2018-01-06 (×6): qty 2

## 2018-01-06 MED ORDER — ALTEPLASE 2 MG IJ SOLR
2.0000 mg | Freq: Once | INTRAMUSCULAR | Status: DC
  Filled 2018-01-06: qty 2

## 2018-01-06 MED ORDER — HEPARIN BOLUS VIA INFUSION
1000.0000 [IU] | Freq: Once | INTRAVENOUS | Status: AC
  Administered 2018-01-06: 1000 [IU] via INTRAVENOUS
  Filled 2018-01-06: qty 1000

## 2018-01-06 MED ORDER — FAT EMULSION PLANT BASED 20 % IV EMUL
250.0000 mL | INTRAVENOUS | Status: AC
  Filled 2018-01-06: qty 250

## 2018-01-06 MED ORDER — TRACE MINERALS CR-CU-MN-SE-ZN 10-1000-500-60 MCG/ML IV SOLN
INTRAVENOUS | Status: AC
  Administered 2018-01-06: 19:00:00 via INTRAVENOUS
  Filled 2018-01-06: qty 1800

## 2018-01-06 MED ORDER — INSULIN GLARGINE 100 UNIT/ML ~~LOC~~ SOLN
9.0000 [IU] | Freq: Once | SUBCUTANEOUS | Status: AC
  Administered 2018-01-06: 9 [IU] via SUBCUTANEOUS
  Filled 2018-01-06: qty 0.09

## 2018-01-06 MED ORDER — HEPARIN (PORCINE) IN NACL 100-0.45 UNIT/ML-% IJ SOLN
1400.0000 [IU]/h | INTRAMUSCULAR | Status: DC
  Administered 2018-01-06: 1300 [IU]/h via INTRAVENOUS
  Administered 2018-01-07: 1400 [IU]/h via INTRAVENOUS
  Filled 2018-01-06 (×2): qty 250

## 2018-01-06 NOTE — Progress Notes (Deleted)
PHARMACY - ADULT TOTAL PARENTERAL NUTRITION CONSULT NOTE   Pharmacy Consult for TPN Management  Indication: colon perforation, NPO Status   Patient Measurements: Height: 4\' 11"  (149.9 cm) Weight: 210 lb 15.7 oz (95.7 kg) IBW/kg (Calculated) : 43.2 TPN AdjBW (KG): 50.5 Body mass index is 42.61 kg/m.   Assessment:   Pharmacy consulted for TPN management for 77 yo female with diffuse inflammation on distal jejunum extending to sigmoid colon with perforation.    TPN Access: PICC Triple Lumen  TPN start date: 12/30/17 Nutritional Goals (per RD recommendation on 7/26): KCal: 1500-1700 kcal  Protein: 87-102 grams  Fluid: > 1.5 L Goal TPN rate is 75 ml/hr (provides 1518 kcal and 90 grams of protein)  Current Nutrition:   Plan:  Clinimix E 5/15 at 54mL/hr on 7/29. ILE 20% at 33mL/hr.   Add MVI, trace elements to TPN  SSI Q4hr. Lantus 9 units x 1 per diabetes team recommendation. Will add 14 units of insulin to TPN.   Monitor TPN labs. Blood Glucose.   Pharmacy will continue to monitor and adjust per consult.   MLS 01/06/2018 2:39 PM

## 2018-01-06 NOTE — Progress Notes (Signed)
PHARMACY - ADULT TOTAL PARENTERAL NUTRITION CONSULT NOTE   Pharmacy Consult for TPN Management  Indication: colon perforation, NPO Status   Patient Measurements: Height: 4\' 11"  (149.9 cm) Weight: 210 lb 15.7 oz (95.7 kg) IBW/kg (Calculated) : 43.2 TPN AdjBW (KG): 50.5 Body mass index is 42.61 kg/m.   Assessment:   Pharmacy consulted for TPN management for 77 yo female with diffuse inflammation on distal jejunum extending to sigmoid colon with perforation.    TPN Access: PICC Triple Lumen  TPN start date: 12/30/17 Nutritional Goals (per RD recommendation on 7/26): KCal: 1500-1700 kcal  Protein: 87-102 grams  Fluid: > 1.5 L Goal TPN rate is 75 ml/hr (provides 1518 kcal and 90 grams of protein)  Current Nutrition:   Plan:  Clinimix E 5/15 at 38mL/hr on 7/29. ILE 20% at 88mL/hr.   Add MVI, trace elements to TPN  SSI Q4hr. Lantus 9 units x 1 per diabetes team recommendation. Will add 14 units of insulin to TPN.   Monitor TPN labs. Blood Glucose. Will recheck electrolytes on 8/3.   Pharmacy will continue to monitor and adjust per consult.   MLS 01/06/2018 5:53 PM

## 2018-01-06 NOTE — Progress Notes (Signed)
[facility] for heparin drip management  Indication: atrial fibrillation  Allergies  Allergen Reactions  . Poison Oak Extract Other (See Comments)    blistering  . Ampicillin Rash  . Penicillin V Potassium Rash    Has patient had a PCN reaction causing immediate rash, facial/tongue/throat swelling, SOB or lightheadedness with hypotension: No Has patient had a PCN reaction causing severe rash involving mucus membranes or skin necrosis: No Has patient had a PCN reaction that required hospitalization: No Has patient had a PCN reaction occurring within the last 10 years: No If all of the above answers are "NO", then may proceed with Cephalosporin use.     Patient Measurements: Height: 4' 11"  (149.9 cm) Weight: 210 lb 15.7 oz (95.7 kg) IBW/kg (Calculated) : 43.2 Heparin Dosing Weight: 60kg  Vital Signs: Temp: 99.7 F (37.6 C) (08/02 2000) Temp Source: Oral (08/02 2000) BP: 100/60 (08/02 2300) Pulse Rate: 97 (08/02 2300)  Labs: Recent Labs    01/04/18 0212  01/04/18 1954 01/05/18 0341 01/05/18 0601 01/06/18 0757 01/06/18 1902  HGB 10.9*  --   --  10.5*  --  9.1*  --   HCT 32.9*  --   --  32.1*  --  27.2*  --   PLT 345  --   --  268  --  379  --   HEPARINUNFRC 0.28*   < > 0.35  --  0.21*  --  0.21*  CREATININE 0.54  --   --  0.51  --   --   --    < > = values in this interval not displayed.    Estimated Creatinine Clearance: 57.8 mL/min (by C-G formula based on SCr of 0.51 mg/dL).   Medications:  Scheduled:  . chlorhexidine  15 mL Mouth Rinse BID  . chlorhexidine gluconate (MEDLINE KIT)  15 mL Mouth Rinse BID  . digoxin  0.125 mg Intravenous Daily  . heparin  1,000 Units Intravenous Once  . hydrocortisone sod succinate (SOLU-CORTEF) inj  100 mg Intravenous Q8H  . insulin aspart  0-15 Units Subcutaneous Q4H  . mouth rinse  15 mL Mouth Rinse 10 times per day  . metoprolol tartrate  5 mg Intravenous Q6H  . sodium chloride flush   10-40 mL Intracatheter Q12H   Infusions:  . Marland KitchenTPN (CLINIMIX-E) Adult 75 mL/hr at 01/06/18 1837  . amiodarone 30 mg/hr (01/06/18 1354)  . diltiazem (CARDIZEM) infusion 5 mg/hr (01/06/18 2258)  . ertapenem Stopped (01/06/18 1821)  . famotidine (PEPCID) IV Stopped (01/06/18 1003)  . Fat emulsion    . fentaNYL infusion INTRAVENOUS 250 mcg/hr (01/06/18 1754)  . heparin 1,300 Units/hr (01/06/18 1343)  . norepinephrine (LEVOPHED) Adult infusion 4 mcg/min (01/06/18 1755)  . sodium chloride      Assessment: Pharmacy consulted for heparin drip management for 77 yo female admitted to ICU with perforated bowel. Patient with chronic atrial fibrillation on apixaban as an outpatient. Patient has not been ordered apixaban this admission. Patient ordered enoxaparin 72m SQ Daily with last dose at 0915. Patient CHA2DS2/VAS of at least 4 (Age, Gender, HTN). Patient s/p exploratory LAP and Hartmans for perforated sigmoid diverticulitis (8/1), Per surgery okay to resume heparin on 8/2.    Goal of Therapy:  Heparin level 0.3-0.7 units/ml Monitor platelets by anticoagulation protocol: Yes   Plan:  08/02 @ 2300 HL 0.21 subtherapeutic. Will bolus w/ heparin 1000 units IV x 1 and increase rate to 1400 units/hr and will recheck anti-Xa @  0700. Hgb down d/t surgery, will continue to monitor.  Tobie Lords, PharmD, BCPS Clinical Pharmacist 01/06/2018

## 2018-01-06 NOTE — Progress Notes (Signed)
[facility] for heparin drip management  Indication: atrial fibrillation  Allergies  Allergen Reactions  . Poison Oak Extract Other (See Comments)    blistering  . Ampicillin Rash  . Penicillin V Potassium Rash    Has patient had a PCN reaction causing immediate rash, facial/tongue/throat swelling, SOB or lightheadedness with hypotension: No Has patient had a PCN reaction causing severe rash involving mucus membranes or skin necrosis: No Has patient had a PCN reaction that required hospitalization: No Has patient had a PCN reaction occurring within the last 10 years: No If all of the above answers are "NO", then may proceed with Cephalosporin use.     Patient Measurements: Height: _0  (149.9 cm) Weight: 206 lb 2.1 oz (93.5 kg) IBW/kg (Calculated) : 43.2 Heparin Dosing Weight: 60kg  Vital Signs: Temp: 98 F (36.7 C) (08/02 0400) Temp Source: Axillary (08/02 0200) BP: 117/63 (08/02 1000) Pulse Rate: 98 (08/02 1000)  Labs: Recent Labs    01/04/18 0212 01/04/18 1102 01/04/18 1954 01/05/18 0341 01/05/18 0601 01/06/18 0757  HGB 10.9*  --   --  10.5*  --  9.1*  HCT 32.9*  --   --  32.1*  --  27.2*  PLT 345  --   --  268  --  379  HEPARINUNFRC 0.28* 0.27* 0.35  --  0.21*  --   CREATININE 0.54  --   --  0.51  --   --     Estimated Creatinine Clearance: 57 mL/min (by C-G formula based on SCr of 0.51 mg/dL).   Medications:  Scheduled:  . alteplase  2 mg Intracatheter Once  . chlorhexidine  15 mL Mouth Rinse BID  . chlorhexidine gluconate (MEDLINE KIT)  15 mL Mouth Rinse BID  . digoxin  0.125 mg Intravenous Daily  . hydrocortisone sod succinate (SOLU-CORTEF) inj  100 mg Intravenous Q8H  . insulin aspart  0-15 Units Subcutaneous Q4H  . insulin glargine  9 Units Subcutaneous Once  . mouth rinse  15 mL Mouth Rinse q12n4p  . mouth rinse  15 mL Mouth Rinse 10 times per day  . metoprolol tartrate  5 mg Intravenous Q6H  . sodium  chloride flush  10-40 mL Intracatheter Q12H   Infusions:  . Marland KitchenTPN (CLINIMIX-E) Adult 83 mL/hr at 01/06/18 0203  . amiodarone 30 mg/hr (01/06/18 0243)  . diltiazem (CARDIZEM) infusion 10 mg/hr (01/06/18 0856)  . ertapenem    . famotidine (PEPCID) IV Stopped (01/06/18 1003)  . fentaNYL infusion INTRAVENOUS 250 mcg/hr (01/06/18 0816)  . heparin    . lactated ringers 125 mL/hr (01/06/18 0818)  . norepinephrine (LEVOPHED) Adult infusion 8 mcg/min (01/06/18 0818)  . sodium chloride      Assessment: Pharmacy consulted for heparin drip management for 77 yo female admitted to ICU with perforated bowel. Patient with chronic atrial fibrillation on apixaban as an outpatient. Patient has not been ordered apixaban this admission. Patient ordered enoxaparin 33m SQ Daily with last dose at 0915. Patient CHA2DS2/VAS of at least 4 (Age, Gender, HTN). Patient s/p exploratory LAP and Hartmans for perforated sigmoid diverticulitis (8/1), Per surgery okay to resume heparin on 8/2.    Goal of Therapy:  Heparin level 0.3-0.7 units/ml Monitor platelets by anticoagulation protocol: Yes   Plan:  Based on recent surgery, will re-initiate heparin at 1300 units/hr, most recent therapeutic dose, with no bolus.   Will check heparin level 8 hours after initiation for heparin.   Pharmacy will continue to monitor  and adjust per consult.   Livan Hires L 01/06/2018 12:45 PM

## 2018-01-06 NOTE — Progress Notes (Signed)
Inpatient Diabetes Program Recommendations  AACE/ADA: New Consensus Statement on Inpatient Glycemic Control (2019)  Target Ranges:  Prepandial:   less than 140 mg/dL      Peak postprandial:   less than 180 mg/dL (1-2 hours)      Critically ill patients:  140 - 180 mg/dL   Results for ADELISE, BUSWELL (MRN [id number]) as of 01/06/2018 10:12  Ref. Range 01/05/2018 07:29 01/05/2018 11:55 01/05/2018 16:03 01/05/2018 19:37 01/05/2018 23:30 01/06/2018 04:23 01/06/2018 08:01  Glucose-Capillary Latest Ref Range: 70 - 99 mg/dL 177 (H) 123 (H) 185 (H) 252 (H) 319 (H) 297 (H) 311 (H)   Review of Glycemic Control Diabetes history: Borderline DM Outpatient Diabetes medications: None Current orders for Inpatient glycemic control: Novolog 0-15 units Q4H  Inpatient Diabetes Program Recommendations:  TPN: Per notes, TPN was on hold yesterday for surgery and MAR notes new TPN bag hung at 18:22 on 01/05/18. CBGs have ranged from 252-319 mg/dl since TPN was restarted on 01/05/18. Pharmacy may want to consider adding insulin to TPN to help improve glycemic control. Would recommend adding Regular 14 units. Insulin - Basal: Due to hyperglycemia noted over the past 12 hours, recommend MD consider ordering one time dose of Lantus 9 units x 1 now (based on 93 kg x 0.1 units). Then reassess daily to determine if basal insulin will be needed.  Thanks, Barnie Alderman, RN, MSN, CDE Diabetes Coordinator Inpatient Diabetes Program [phone] (Team Pager from 8am to 5pm)

## 2018-01-06 NOTE — Anesthesia Postprocedure Evaluation (Signed)
Anesthesia Post Note  Patient: Jody [name]  Procedure(s) Performed: EXPLORATORY LAPAROTOMY/ POSSIBLE BOWEL OBSTRUCTION (N/A ) LYSIS OF ADHESION (N/A ) COLOSTOMY (N/A )  Patient location during evaluation: ICU Anesthesia Type: General Level of consciousness: patient remains intubated per anesthesia plan Pain management: pain level controlled Vital Signs Assessment: post-procedure vital signs reviewed and stable Respiratory status: patient on ventilator - see flowsheet for VS Cardiovascular status: stable Anesthetic complications: no     Last Vitals:  Vitals:   01/06/18 0400 01/06/18 0500  BP: (!) 103/57 (!) 96/51  Pulse: 92 86  Resp: 20 20  Temp: 36.7 C   SpO2: 95% 96%    Last Pain:  Vitals:   01/06/18 0200  TempSrc: Axillary  PainSc:                  Jody [name]

## 2018-01-06 NOTE — Progress Notes (Signed)
Nutrition Follow-up  DOCUMENTATION CODES:   Not applicable  INTERVENTION:  Initiate new goal TPN regimen of Clinimix E 5/15 at 75 mL/hr + 20% ILE at 10 mL/hr over 12 hours. Provides 1518 kcal, 90 grams of protein, 1800 mL fluid daily (+120 mL lipids).  Continue adult MVI and trace elements as daily TPN additives.  Continue measuring daily weights.  NUTRITION DIAGNOSIS:   Inadequate oral intake related to acute illness(colon perf) as evidenced by per patient/family report, NPO status.  Ongoing - addressing with TPN.  GOAL:   Provide needs based on ASPEN/SCCM guidelines  Met with TPN.  MONITOR:   I & O's, Labs  REASON FOR ASSESSMENT:   Consult New TPN/TNA  ASSESSMENT:   Patient with PMH of A-fib, Lung Cancer, Asthma, HTN, and borderline DM presents with abdominal pain. On CT she was noted to have diffuse inflammation of the distal jejenum extending to the descending/sigmoid colon with perforation which may be secondary to diverticulitis.    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  Patient now intubated and sedated following surgery yesterday. On PRVC mode with FiO2 40%. On Bair Hugger at time of RD assessment. Will use PSU 2003b to estimate needs while patient is on ventilator as patient will likely not benefit from underfeeding. Also she was volume overloaded before first weight was even taken so she is not truly morbidly obese.  IV Access: right basilic PICC triple lumen placed 7/26; tip terminates at upper cavoatrial junction per chest x-ray 7/26  TPN: pt tolerating Clinimix E 5/15 at 83 mL/hr + 20% ILE at 20 mL/hr over 12 hours; insulin was not put in TPN yesterday as patient was in OR, but plan is to resume insulin in TPN today  Enteral Access: NGT placed 7/31; terminates in stomach per abdominal x-ray 7/31; on LIS  MAP: 65-96  mmHg  Patient is currently intubated on ventilator support Ve: 12.2 L/min Temp (24hrs), Avg:97.9 F (36.6 C), Min:97.6 F (36.4 C), Max:98.4 F (36.9 C)  Propofol: N/A  Medications reviewed and include: Novolog 0-15 units Q4hrs (received 41 units past 24 hrs), Lantus 9 units once today, metoprolol, amiodarone gtt, diltiazem gtt, Invanz, famotidine, fentanyl gtt, heparin gtt, LR @ 125 mL/hr, norepinephrine gtt at 8 mcg/min.  Labs reviewed: CBG 123-319 past 24 hrs, Phosphorus and Magnesium WNL.  I/O: 1000 mL UOP yesterday (0.4 mL/kg/hr), 375 mL output from NGT yesterday, 0 mL output from wound VAC, 0 mL output from colostomy  Weight trend: 93.5 kg on 8/2; +9.1 kg from admission  Discussed with RN.  Diet Order:   Diet Order           Diet NPO time specified Except for: Sips with Meds  Diet effective now          EDUCATION NEEDS:   Not appropriate for education at this time  Skin:  Skin Assessment: Skin Integrity Issues: Skin Integrity Issues:: Incisions Incisions: closed incision to abdomen with negative pressure wound therapy in place  Last BM:  01/05/2018 - large type 7; now with colostomy  Height:   Ht Readings from Last 1 Encounters:  12/27/17 4' 11"  (1.499 m)    Weight:   Wt Readings from Last 1 Encounters:  01/06/18 206 lb 2.1 oz (93.5 kg)    Ideal Body Weight:  44.69 kg  BMI:  Body mass index is 41.63 kg/m.  Estimated Nutritional Needs:  Kcal:  1509 (PSU 2003b w/ MSJ 1230, Ve 12.2, Tmax 36.9)  Protein:  87-102 grams (1.2-1.4g/kg)  Fluid:  1.8 L/day (25 mL/kg)  Willey Blade, MS, RD, LDN Office: [phone] Pager: [phone] After Hours/Weekend Pager: [phone]

## 2018-01-06 NOTE — Progress Notes (Signed)
Subjective:    Jody [name] is a 77 y.o. female  Hospital stay day 10, 1 Day Post-Op Ex lap, LOA, hartmans for perforated sigmoid diverticulitis.  Vented, sedated.  Increasing need for levo, now at 18mcg.  S/p two L bolus and on LR@125 , 0.85ml/kg/cc U/O.  No other issues reported.  Unmeasured stool output noted in chart.   Objective:      Temp:  [97.6 F (36.4 C)-98.4 F (36.9 C)] 98 F (36.7 C) (08/02 0400) Pulse Rate:  [69-123] 86 (08/02 0500) Resp:  [15-31] 20 (08/02 0500) BP: (60-135)/(37-82) 96/51 (08/02 0500) SpO2:  [91 %-98 %] 96 % (08/02 0500) FiO2 (%):  [40 %] 40 % (08/02 0348) Weight:  [93.5 kg (206 lb 2.1 oz)] 93.5 kg (206 lb 2.1 oz) (08/02 0433)     Height: 4\' 11"  (149.9 cm) Weight: 93.5 kg (206 lb 2.1 oz) BMI (Calculated): 41.61   Intake/Output this shift:  No intake/output data recorded.       Constitutional :  appears stated age and vented and sedated  Respiratory:  clear to auscultation bilaterally  Cardiovascular:  regularly irregular rhythm  Gastrointestinal: soft, but responses to palpation along incision line with wince, minimal guarding.  incison vac intact, ostomy site c/di/i as well.  no output in bag.   Skin: Cool and moist.  Psychiatric: Normal affect, non-agitated, not confused       LABS:  CMP Latest Ref Rng & Units 01/05/2018 01/04/2018 01/03/2018  Glucose 70 - 99 mg/dL 198(H) 265(H) 242(H)  BUN 8 - 23 mg/dL 20 21 17   Creatinine 0.44 - 1.00 mg/dL 0.51 0.54 0.67  Sodium 135 - 145 mmol/L 133(L) 132(L) 135  Potassium 3.5 - 5.1 mmol/L 4.0 3.7 3.5  Chloride 98 - 111 mmol/L 99 95(L) 97(L)  CO2 22 - 32 mmol/L 29 32 33(H)  Calcium 8.9 - 10.3 mg/dL 7.7(L) 7.9(L) 7.7(L)  Total Protein 6.5 - 8.1 g/dL 4.6(L) - 4.8(L)  Total Bilirubin 0.3 - 1.2 mg/dL 0.3 - 0.3  Alkaline Phos 38 - 126 U/L 48 - 46  AST 15 - 41 U/L 12(L) - 17  ALT 0 - 44 U/L 10 - 11   CBC Latest Ref Rng & Units 01/05/2018 01/04/2018 01/03/2018  WBC 3.6 - 11.0 K/uL 17.5(H) 19.9(H) 16.8(H)   Hemoglobin 12.0 - 16.0 g/dL 10.5(L) 10.9(L) 10.6(L)  Hematocrit 35.0 - 47.0 % 32.1(L) 32.9(L) 31.5(L)  Platelets 150 - 440 K/uL 268 345 305    RADS: none Assessment:   Ex lap, LOA, hartmans for perforated sigmoid diverticulitis.  Cont to monitor very closely with labile HTN.  Urine output adequate, no drop in hgb, wbc decreasing, so BP issues likely not a volume issue.  Cont vent management per ICU team.  OK to restart heparin drip  IVF: LR@125ml  Abx: invanze DVT prophy: heparin drip GI prophy: pepcid Diet: NPO

## 2018-01-06 NOTE — Progress Notes (Signed)
CRITICAL CARE NOTE  CC  follow up POD 1 from exploratory laparotomy with lysis of adhesions, abdominal washout, splenic flexure takedown and extended left colon resection and colostomy  SUBJECTIVE Patient remains critically ill Prognosis is guarded  77 year old female who was admitted on 7/26 for perforated bowel secondary to diverticulitis. She is POD1 from exploratory laparotomy with lysis of adhesions, abdominal washout, splenic flexure takedown and extended left colon resection and colostomy. She remains intubated. This morning she is following commands and appears comfortable.    SIGNIFICANT EVENTS  None overnight  BP (!) 96/51   Pulse 86   Temp 98 F (36.7 C)   Resp 20   Ht 4\' 11"  (1.499 m)   Wt 93.5 kg (206 lb 2.1 oz)   SpO2 96%   BMI 41.63 kg/m    REVIEW OF SYSTEMS  PATIENT IS UNABLE TO PROVIDE COMPLETE REVIEW OF SYSTEM S DUE TO SEVERE CRITICAL ILLNESS AND ENCEPHALOPATHY   PHYSICAL EXAMINATION:  GENERAL:critically ill appearing, -resp distress HEAD: Normocephalic, atraumatic.  EYES: Pupils equal, round, reactive to light.  No scleral icterus.  MOUTH: Moist mucosal membrane. NECK: Supple. No thyromegaly. No nodules. No JVD.  PULMONARY: clear to auscultation bilaterally CARDIOVASCULAR: S1 and S2. Regular rate and irregular rhythm. No murmurs, rubs, or gallops.  GASTROINTESTINAL: Soft, non-distended, mild TTP along incision site. Laparotomy incision with wound vac in place. Left colostomy w/out drainage.   MUSCULOSKELETAL: Swelling of bilateral hands and feet.  NEUROLOGIC: alert and following verbal commands SKIN:intact,warm,dry  ASSESSMENT AND PLAN SYNOPSIS  77 year old female with perforated bowel secondary to diverticulitis. Patient remains intubated after surgery yesterday. Doing well on vent. Patient continues to be in atrial fibrillation without RVR. She has remained afebrile. Has required pressors for BP support during and after surgery   Continued  intubation post-operatively: -continue Full MV support -continue Bronchodilator Therapy -Wean Fio2 and PEEP as tolerated -will perform SAT/SBT today. Patient following commands this morning -consider dose of lasix with bilateral pleural effusions and atelectasis if unable to wean from vent  GASTROINTESTINAL -perforated bowel secondary to diverticulitis with SBO -exp. Laparotomy with washout and colostomy performed yesterday -patient with 375 mL of output from NG tube yesterday -continue TPN  AKI with lactic acidosis: -resolved -will continue to trend creatinine   NEUROLOGY - intubated and sedated: following verbal commands - minimal sedation to achieve a RASS goal: -1   CARDIAC -chronic atrial fibrillation -remains on amiodarone drip and diltiazem drip, digoxin -hold metoprolol in setting of hypotension -restart heparin drip today -levophed for pressure support  ID -no antibiotics at this time: trend CBC  -afebrile -follow up on left colon biopsy   DVT: SCDs GI PRX: pepcid TRANSFUSIONS AS NEEDED MONITOR FSBS ASSESS the need for LABS as needed   Critical Care Time devoted to patient care services described in this note is 28 minutes.   Overall, patient is critically ill, prognosis is guarded.  Patient with Multiorgan failure and at high risk for cardiac arrest and death.

## 2018-01-07 LAB — BLOOD GAS, ARTERIAL
Acid-Base Excess: 3 mmol/L — ABNORMAL HIGH (ref 0.0–2.0)
Acid-Base Excess: 2.3 mmol/L — ABNORMAL HIGH (ref 0.0–2.0)
BICARBONATE: 27.8 mmol/L (ref 20.0–28.0)
Bicarbonate: 27 mmol/L (ref 20.0–28.0)
FIO2: 40
FIO2: 0.3
MECHVT: 550 mL
Mechanical Rate: 20
O2 SAT: 95 %
O2 Saturation: 98.9 %
PATIENT TEMPERATURE: 37
PEEP: 5 cmH2O
PEEP: 5 cmH2O
PO2 ART: 77 mmHg — AB (ref 83.0–108.0)
PRESSURE SUPPORT: 5 cmH2O
Patient temperature: 37
pCO2 arterial: 38 mmHg (ref 32.0–48.0)
pCO2 arterial: 47 mmHg (ref 32.0–48.0)
pH, Arterial: 7.46 — ABNORMAL HIGH (ref 7.350–7.450)
pH, Arterial: 7.38 (ref 7.350–7.450)
pO2, Arterial: 122 mmHg — ABNORMAL HIGH (ref 83.0–108.0)

## 2018-01-07 LAB — GLUCOSE, CAPILLARY
GLUCOSE-CAPILLARY: 156 mg/dL — AB (ref 70–99)
GLUCOSE-CAPILLARY: 171 mg/dL — AB (ref 70–99)
GLUCOSE-CAPILLARY: 183 mg/dL — AB (ref 70–99)
Glucose-Capillary: 157 mg/dL — ABNORMAL HIGH (ref 70–99)
Glucose-Capillary: 153 mg/dL — ABNORMAL HIGH (ref 70–99)
Glucose-Capillary: 155 mg/dL — ABNORMAL HIGH (ref 70–99)

## 2018-01-07 LAB — MAGNESIUM: Magnesium: 1.9 mg/dL (ref 1.7–2.4)

## 2018-01-07 LAB — CBC WITH DIFFERENTIAL/PLATELET
Basophils Absolute: 0.1 10*3/uL (ref 0–0.1)
Basophils Relative: 0 %
EOS ABS: 0 10*3/uL (ref 0–0.7)
EOS PCT: 0 %
HCT: 23.1 % — ABNORMAL LOW (ref 35.0–47.0)
HEMOGLOBIN: 7.7 g/dL — AB (ref 12.0–16.0)
LYMPHS ABS: 0.3 10*3/uL — AB (ref 1.0–3.6)
Lymphocytes Relative: 1 %
MCH: 26.8 pg (ref 26.0–34.0)
MCHC: 33.2 g/dL (ref 32.0–36.0)
MCV: 80.9 fL (ref 80.0–100.0)
MONO ABS: 1.1 10*3/uL — AB (ref 0.2–0.9)
MONOS PCT: 4 %
NEUTROS PCT: 95 %
Neutro Abs: 23.3 10*3/uL — ABNORMAL HIGH (ref 1.4–6.5)
Platelets: 297 10*3/uL (ref 150–440)
RBC: 2.86 MIL/uL — ABNORMAL LOW (ref 3.80–5.20)
RDW: 16.4 % — AB (ref 11.5–14.5)
WBC: 24.8 10*3/uL — ABNORMAL HIGH (ref 3.6–11.0)

## 2018-01-07 LAB — PREPARE RBC (CROSSMATCH)

## 2018-01-07 LAB — BASIC METABOLIC PANEL
Anion gap: 5 (ref 5–15)
BUN: 24 mg/dL — ABNORMAL HIGH (ref 8–23)
CALCIUM: 7.2 mg/dL — AB (ref 8.9–10.3)
CO2: 27 mmol/L (ref 22–32)
Chloride: 97 mmol/L — ABNORMAL LOW (ref 98–111)
Creatinine, Ser: 0.62 mg/dL (ref 0.44–1.00)
GFR calc Af Amer: >60 mL/min (ref >60)
GFR calc non Af Amer: >60 mL/min (ref >60)
Glucose, Bld: 216 mg/dL — ABNORMAL HIGH (ref 70–99)
Potassium: 4.3 mmol/L (ref 3.5–5.1)
Sodium: 129 mmol/L — ABNORMAL LOW (ref 135–145)

## 2018-01-07 LAB — PHOSPHORUS: Phosphorus: 2.4 mg/dL — ABNORMAL LOW (ref 2.5–4.6)

## 2018-01-07 LAB — CBC
HCT: 30.6 % — ABNORMAL LOW (ref 35.0–47.0)
Hemoglobin: 10.5 g/dL — ABNORMAL LOW (ref 12.0–16.0)
MCH: 28.2 pg (ref 26.0–34.0)
MCHC: 34.4 g/dL (ref 32.0–36.0)
MCV: 82.1 fL (ref 80.0–100.0)
PLATELETS: 391 10*3/uL (ref 150–440)
RBC: 3.73 MIL/uL — AB (ref 3.80–5.20)
RDW: 15.9 % — ABNORMAL HIGH (ref 11.5–14.5)
WBC: 30.6 10*3/uL — AB (ref 3.6–11.0)

## 2018-01-07 LAB — HEPARIN LEVEL (UNFRACTIONATED): Heparin Unfractionated: 0.22 IU/mL — ABNORMAL LOW (ref 0.30–0.70)

## 2018-01-07 LAB — ABO/RH: ABO/RH(D): A POS

## 2018-01-07 MED ORDER — TRACE MINERALS CR-CU-MN-SE-ZN 10-1000-500-60 MCG/ML IV SOLN
INTRAVENOUS | Status: AC
  Administered 2018-01-07: 18:00:00 via INTRAVENOUS
  Filled 2018-01-07: qty 1800

## 2018-01-07 MED ORDER — CHLORHEXIDINE GLUCONATE 0.12 % MT SOLN
15.0000 mL | Freq: Two times a day (BID) | OROMUCOSAL | Status: DC
  Administered 2018-01-07 – 2018-01-22 (×27): 15 mL via OROMUCOSAL
  Filled 2018-01-07 (×26): qty 15

## 2018-01-07 MED ORDER — SODIUM CHLORIDE 0.9% IV SOLUTION: Freq: Once | INTRAVENOUS | Status: AC

## 2018-01-07 MED ORDER — FAT EMULSION PLANT BASED 20 % IV EMUL
120.0000 mL | INTRAVENOUS | Status: AC
  Administered 2018-01-07: 120 mL via INTRAVENOUS
  Filled 2018-01-07: qty 120

## 2018-01-07 MED ORDER — SODIUM CHLORIDE 0.9% IV SOLUTION
Freq: Once | INTRAVENOUS | Status: AC
  Administered 2018-01-07: 13:00:00 via INTRAVENOUS

## 2018-01-07 MED ORDER — SODIUM PHOSPHATES 45 MMOLE/15ML IV SOLN
10.0000 mmol | Freq: Once | INTRAVENOUS | Status: DC
  Filled 2018-01-07: qty 3.33

## 2018-01-07 MED ORDER — ORAL CARE MOUTH RINSE
15.0000 mL | Freq: Two times a day (BID) | OROMUCOSAL | Status: DC
  Administered 2018-01-07: 15 mL via OROMUCOSAL

## 2018-01-07 MED ORDER — ORAL CARE MOUTH RINSE
15.0000 mL | Freq: Two times a day (BID) | OROMUCOSAL | Status: DC
  Administered 2018-01-07 – 2018-01-21 (×19): 15 mL via OROMUCOSAL

## 2018-01-07 MED ORDER — POTASSIUM & SODIUM PHOSPHATES 280-160-250 MG PO PACK
2.0000 | PACK | Freq: Three times a day (TID) | ORAL | Status: AC
  Administered 2018-01-07 (×2): 2
  Filled 2018-01-07 (×2): qty 2

## 2018-01-07 NOTE — Progress Notes (Signed)
PHARMACY - ADULT TOTAL PARENTERAL NUTRITION CONSULT NOTE   Pharmacy Consult for TPN Management  Indication: colon perforation, NPO Status   Patient Measurements: Height: 4\' 11"  (149.9 cm) Weight: 203 lb 4.2 oz (92.2 kg) IBW/kg (Calculated) : 43.2 TPN AdjBW (KG): 50.5 Body mass index is 41.05 kg/m.   Assessment:  Pharmacy consulted for TPN management for 77 yo female with diffuse inflammation on distal jejunum extending to sigmoid colon with perforation.   TPN Access: PICC Triple Lumen  TPN start date: 12/30/17 Nutritional Goals (per RD recommendation on 7/26): KCal: 1500-1700 kcal  Protein: 87-102 grams  Fluid: > 1.5 L Goal TPN rate is 75 ml/hr (provides 1518 kcal and 90 grams of protein)  Current Nutrition:   Plan:  Phos-Nak 2 Packets x 2 doses per tube ordered for Phos replacement.   Continue Clinimix E 5/15 at 38mL/hr on 7/29. ILE 20% at 57mL/hr.   Add MVI, trace elements to TPN  SSI Q4hr. Will add 14 units of insulin to TPN again. May need to increase tomorrow.    Monitor TPN labs. Blood Glucose. Will recheck electrolytes on 8/4.   Pharmacy will continue to monitor and adjust per consult.   Pernell Dupre, PharmD, BCPS Clinical Pharmacist 01/07/2018 8:55 AM

## 2018-01-07 NOTE — Progress Notes (Signed)
Post ABG  FIO2 to 30%  Tolerating well.

## 2018-01-07 NOTE — Progress Notes (Signed)
Patient extubated this AM and follows commands. A+OX2. Titrated off Cardizem drip. VS stable. Patient still NPO with NG tube hooked onto low intermittent suction. Patient has been drowsy throughout shift but easily arousable.

## 2018-01-07 NOTE — Progress Notes (Signed)
CRITICAL CARE NOTE  CC  follow up POD 2 from exploratory laparotomy with lysis of adhesions, abdominal washout, splenic flexure takedown and extended left colon resection and colostomy  SUBJECTIVE Patient remains critically ill Prognosis is guarded  77 year old female who was admitted on 7/26 for perforated bowel secondary to diverticulitis. She is POD2 from exploratory laparotomy with lysis of adhesions, abdominal washout, splenic flexure takedown and extended left colon resection and colostomy. She remains intubated. This morning she is following commands and appears comfortable.    SIGNIFICANT EVENTS  None overnight  BP (!) 107/54   Pulse 97   Temp 98.7 F (37.1 C) (Axillary)   Resp 20   Ht 4\' 11"  (1.499 m)   Wt 203 lb 4.2 oz (92.2 kg)   SpO2 98%   BMI 41.05 kg/m    REVIEW OF SYSTEMS  PATIENT IS UNABLE TO PROVIDE COMPLETE REVIEW OF SYSTEM S DUE TO SEVERE CRITICAL ILLNESS AND ENCEPHALOPATHY   PHYSICAL EXAMINATION:  GENERAL:critically ill appearing, -resp distress HEAD: Normocephalic, atraumatic.  EYES: Pupils equal, round, reactive to light.  No scleral icterus.  MOUTH: Moist mucosal membrane. NECK: Supple. No thyromegaly. No nodules. No JVD.  PULMONARY: clear to auscultation bilaterally CARDIOVASCULAR: S1 and S2. Regular rate and irregular rhythm. No murmurs, rubs, or gallops.  GASTROINTESTINAL: Soft, non-distended, mild TTP along incision site. Laparotomy incision with wound vac in place. Left colostomy w/out drainage.   MUSCULOSKELETAL: Swelling of bilateral hands and feet.  NEUROLOGIC: alert and following verbal commands SKIN:intact,warm,dry  ASSESSMENT AND PLAN SYNOPSIS  77 year old female with perforated bowel secondary to diverticulitis. Patient remains intubated after surgery yesterday. Doing well on vent. Patient continues to be in atrial fibrillation without RVR. She has remained afebrile. Has required pressors for BP support during and after  surgery   Respiratory failure on mechanical ventilation. Patient's status has quickly improved. On only low-dose norepinephrine. We'll perform spontaneous awakening and breathing trial and assess for possible extubation  Status post repair of perforated bowel secondary to diverticulitis with laparotomy, washout and colostomy.presently on TPN. Presently on ertapenem, receiving 1 unit of packed red blood cells for hemoglobin of 7.8 in the setting of hypotension postoperatively. Appreciate Dr.Sakai's assistance with management  Prerenal indices. Doing well, BUN 24 and creatinine 0.62  Hypernatremia on TPN, will give additional sodium  Atrial fibrillation. Presently on amiodarone, Cardizem, digoxin. Hopefully will be able to wean off norepinephrine to keep the ventricular response under control  DVT: SCDs GI PRX: pepcid TRANSFUSIONS AS NEEDED MONITOR FSBS ASSESS the need for LABS as needed   Critical Care Time devoted to patient care services described in this note is 30 minutes.    Patient ID: HALCYON HECK, female   DOB: 08-Jul-1940, 77 y.o.   MRN: [id number]

## 2018-01-07 NOTE — Progress Notes (Signed)
2 Days Post-Op  Subjective: Status post Hartman's procedure.  No identifiable bleeding site is been found but she had a drop in hemoglobin overnight.  Discussed with nursing.  Transfusion is being planned but consent was needed to complete the orders.  Objective: Vital signs in last 24 hours: Temp:  [98.2 F (36.8 C)-100.1 F (37.8 C)] 98.2 F (36.8 C) (08/03 0300) Pulse Rate:  [48-121] 88 (08/03 0600) Resp:  [0-24] 22 (08/03 0600) BP: (87-117)/(38-87) 95/55 (08/03 0600) SpO2:  [91 %-100 %] 99 % (08/03 0600) FiO2 (%):  [40 %] 40 % (08/03 0333) Weight:  [203 lb 4.2 oz (92.2 kg)-210 lb 15.7 oz (95.7 kg)] 203 lb 4.2 oz (92.2 kg) (08/03 0500) Last BM Date: 01/05/18  Intake/Output from previous day: 08/02 0701 - 08/03 0700 In: 3439.9 [I.V.:3339.9; IV Piggyback:100] Out: 2300 [Urine:2300] Intake/Output this shift: No intake/output data recorded.  Physical exam:  On vent responds to stimulus.  Vital signs reviewed and stable Abdomen soft nontender with wound VAC in place and drain in place.  Lab Results: CBC  Recent Labs    01/06/18 0757 01/07/18 0454  WBC 31.8* 24.8*  HGB 9.1* 7.7*  HCT 27.2* 23.1*  PLT 379 297   BMET Recent Labs    01/05/18 0341 01/07/18 0454  NA 133* 129*  K 4.0 4.3  CL 99 97*  CO2 29 27  GLUCOSE 198* 216*  BUN 20 24*  CREATININE 0.51 0.62  CALCIUM 7.7* 7.2*   PT/INR No results for input(s): LABPROT, INR in the last 72 hours. ABG Recent Labs    01/05/18 2040 01/07/18 0418  PHART 7.41 7.46*  HCO3 23.5 27.0    Studies/Results: No results found.  Anti-infectives: Anti-infectives (From admission, onward)   Start     Dose/Rate Route Frequency Ordered Stop   01/06/18 1800  ertapenem (INVANZ) 1,000 mg in sodium chloride 0.9 % 100 mL IVPB    Note to Pharmacy:  Discussed with ID and ok to proceed despite allergy   1 g 200 mL/hr over 30 Minutes Intravenous Every 24 hours 01/05/18 2004     01/05/18 1600  ertapenem (INVANZ) 1,000 mg in  sodium chloride 0.9 % 100 mL IVPB  Status:  Discontinued    Note to Pharmacy:  Discussed with ID and ok to proceed despite allergy   1 g 200 mL/hr over 30 Minutes Intravenous Every 24 hours 01/05/18 1557 01/05/18 2004   12/31/17 1000  levofloxacin (LEVAQUIN) IVPB 750 mg  Status:  Discontinued     750 mg 100 mL/hr over 90 Minutes Intravenous Every 24 hours 12/30/17 1258 01/05/18 1557   12/29/17 1500  levofloxacin (LEVAQUIN) IVPB 750 mg  Status:  Discontinued     750 mg 100 mL/hr over 90 Minutes Intravenous Every 48 hours 12/27/17 1340 12/28/17 0821   12/28/17 1200  levofloxacin (LEVAQUIN) IVPB 750 mg  Status:  Discontinued     750 mg 100 mL/hr over 90 Minutes Intravenous Every 24 hours 12/28/17 0821 12/30/17 1258   12/27/17 1400  aztreonam (AZACTAM) 1 g in sodium chloride 0.9 % 100 mL IVPB  Status:  Discontinued     1 g 200 mL/hr over 30 Minutes Intravenous Every 8 hours 12/27/17 1025 12/27/17 1222   12/27/17 1400  metroNIDAZOLE (FLAGYL) IVPB 500 mg  Status:  Discontinued     500 mg 100 mL/hr over 60 Minutes Intravenous Every 8 hours 12/27/17 1222 01/05/18 1557   12/27/17 1230  levofloxacin (LEVAQUIN) IVPB 750 mg  Status:  Discontinued  750 mg 100 mL/hr over 90 Minutes Intravenous Every 24 hours 12/27/17 1222 12/27/17 1340   12/27/17 1030  vancomycin (VANCOCIN) IVPB 1000 mg/200 mL premix     1,000 mg 200 mL/hr over 60 Minutes Intravenous  Once 12/27/17 1025 12/27/17 1548   12/27/17 1030  levofloxacin (LEVAQUIN) IVPB 750 mg  Status:  Discontinued     750 mg 100 mL/hr over 90 Minutes Intravenous  Once 12/27/17 1025 12/27/17 1352      Assessment/Plan: s/p Procedure(s): EXPLORATORY LAPAROTOMY/ POSSIBLE BOWEL OBSTRUCTION LYSIS OF ADHESION COLOSTOMY   Labs reviewed with the drop in hemoglobin.  Transfusion was planned by Dr. Lysle Pearl who I spoke to personally.  I did have to obtain the consent from the husband and I did so by discussing the options as well as the risks of transfusion  reaction and transmissible agents.  He understood and agreed with this plan.  Orders placed.  Florene Glen, MD, FACS  01/07/2018

## 2018-01-07 NOTE — Progress Notes (Addendum)
[facility] for heparin drip management  Indication: atrial fibrillation  Allergies  Allergen Reactions  . Poison Oak Extract Other (See Comments)    blistering  . Ampicillin Rash  . Penicillin V Potassium Rash    Has patient had a PCN reaction causing immediate rash, facial/tongue/throat swelling, SOB or lightheadedness with hypotension: No Has patient had a PCN reaction causing severe rash involving mucus membranes or skin necrosis: No Has patient had a PCN reaction that required hospitalization: No Has patient had a PCN reaction occurring within the last 10 years: No If all of the above answers are "NO", then may proceed with Cephalosporin use.     Patient Measurements: Height: _0  (149.9 cm) Weight: 203 lb 4.2 oz (92.2 kg) IBW/kg (Calculated) : 43.2 Heparin Dosing Weight: 60kg  Vital Signs: Temp: 98.2 F (36.8 C) (08/03 0300) Temp Source: Oral (08/03 0300) BP: 95/55 (08/03 0600) Pulse Rate: 88 (08/03 0600)  Labs: Recent Labs    01/05/18 0341 01/05/18 0601 01/06/18 0757 01/06/18 1902 01/07/18 0454 01/07/18 0711  HGB 10.5*  --  9.1*  --  7.7*  --   HCT 32.1*  --  27.2*  --  23.1*  --   PLT 268  --  379  --  297  --   HEPARINUNFRC  --  0.21*  --  0.21*  --  0.22*  CREATININE 0.51  --   --   --  0.62  --     Estimated Creatinine Clearance: 56.5 mL/min (by C-G formula based on SCr of 0.62 mg/dL).   Medications:  Scheduled:  . sodium chloride   Intravenous Once  . chlorhexidine  15 mL Mouth Rinse BID  . chlorhexidine gluconate (MEDLINE KIT)  15 mL Mouth Rinse BID  . digoxin  0.125 mg Intravenous Daily  . hydrocortisone sod succinate (SOLU-CORTEF) inj  100 mg Intravenous Q8H  . insulin aspart  0-15 Units Subcutaneous Q4H  . mouth rinse  15 mL Mouth Rinse 10 times per day  . metoprolol tartrate  5 mg Intravenous Q6H  . sodium chloride flush  10-40 mL Intracatheter Q12H   Infusions:  . Marland KitchenTPN (CLINIMIX-E) Adult 75 mL/hr  at 01/06/18 1837  . amiodarone 30 mg/hr (01/07/18 0221)  . diltiazem (CARDIZEM) infusion 5 mg/hr (01/06/18 2258)  . ertapenem Stopped (01/06/18 1821)  . famotidine (PEPCID) IV Stopped (01/06/18 1003)  . fentaNYL infusion INTRAVENOUS 250 mcg/hr (01/07/18 0350)  . norepinephrine (LEVOPHED) Adult infusion 4 mcg/min (01/06/18 1755)  . sodium chloride    . sodium phosphate  Dextrose 5% IVPB      Assessment: Pharmacy consulted for heparin drip management for 77 yo female admitted to ICU with perforated bowel. Patient with chronic atrial fibrillation on apixaban as an outpatient. Patient has not been ordered apixaban this admission. Patient ordered enoxaparin 66m SQ Daily with last dose at 0915. Patient CHA2DS2/VAS of at least 4 (Age, Gender, HTN). Patient s/p exploratory LAP and Hartmans for perforated sigmoid diverticulitis (8/1), Per surgery okay to resume heparin on 8/2.    Goal of Therapy:  Heparin level 0.3-0.7 units/ml Monitor platelets by anticoagulation protocol: Yes   Plan:  08/03 @ 0711 HL 0.22. Level is still subtherapeutic. According to chart heparin has been discontinued by Dr. SLysle Pearl    SPernell Dupre PharmD, BCPS Clinical Pharmacist 01/07/2018 7:52 AM

## 2018-01-07 NOTE — Progress Notes (Signed)
Patient's HOB elevated to, cuff deflated, suctioned orally and endotracheally and then extubated to 4 lpm O2 [place]

## 2018-01-08 LAB — CBC WITH DIFFERENTIAL/PLATELET
BASOS PCT: 0 %
Basophils Absolute: 0 10*3/uL (ref 0–0.1)
EOS PCT: 0 %
Eosinophils Absolute: 0 10*3/uL (ref 0–0.7)
HEMATOCRIT: 26.7 % — AB (ref 35.0–47.0)
Hemoglobin: 9.2 g/dL — ABNORMAL LOW (ref 12.0–16.0)
LYMPHS PCT: 1 %
Lymphs Abs: 0.3 10*3/uL — ABNORMAL LOW (ref 1.0–3.6)
MCH: 28 pg (ref 26.0–34.0)
MCHC: 34.4 g/dL (ref 32.0–36.0)
MCV: 81.5 fL (ref 80.0–100.0)
MONO ABS: 1 10*3/uL — AB (ref 0.2–0.9)
MONOS PCT: 5 %
NEUTROS ABS: 17.1 10*3/uL — AB (ref 1.4–6.5)
Neutrophils Relative %: 94 %
Platelets: 306 10*3/uL (ref 150–440)
RBC: 3.27 MIL/uL — ABNORMAL LOW (ref 3.80–5.20)
RDW: 16.3 % — AB (ref 11.5–14.5)
WBC: 18.4 10*3/uL — ABNORMAL HIGH (ref 3.6–11.0)

## 2018-01-08 LAB — GLUCOSE, CAPILLARY
GLUCOSE-CAPILLARY: 154 mg/dL — AB (ref 70–99)
GLUCOSE-CAPILLARY: 160 mg/dL — AB (ref 70–99)
GLUCOSE-CAPILLARY: 160 mg/dL — AB (ref 70–99)
GLUCOSE-CAPILLARY: 187 mg/dL — AB (ref 70–99)
GLUCOSE-CAPILLARY: 234 mg/dL — AB (ref 70–99)
Glucose-Capillary: 119 mg/dL — ABNORMAL HIGH (ref 70–99)

## 2018-01-08 LAB — BASIC METABOLIC PANEL
Anion gap: 5 (ref 5–15)
BUN: 25 mg/dL — AB (ref 8–23)
CALCIUM: 7.5 mg/dL — AB (ref 8.9–10.3)
CO2: 28 mmol/L (ref 22–32)
CREATININE: 0.68 mg/dL (ref 0.44–1.00)
Chloride: 101 mmol/L (ref 98–111)
GFR calc Af Amer: >60 mL/min (ref >60)
GFR calc non Af Amer: >60 mL/min (ref >60)
GLUCOSE: 279 mg/dL — AB (ref 70–99)
Potassium: 4.9 mmol/L (ref 3.5–5.1)
Sodium: 134 mmol/L — ABNORMAL LOW (ref 135–145)

## 2018-01-08 LAB — BPAM RBC
Blood Product Expiration Date: 201908062359
ISSUE DATE / TIME: 201908031231
Unit Type and Rh: 6200

## 2018-01-08 LAB — TYPE AND SCREEN
ABO/RH(D): A POS
Antibody Screen: NEGATIVE
UNIT DIVISION: 0

## 2018-01-08 LAB — MAGNESIUM: Magnesium: 2.2 mg/dL (ref 1.7–2.4)

## 2018-01-08 LAB — PHOSPHORUS: Phosphorus: 3.4 mg/dL (ref 2.5–4.6)

## 2018-01-08 MED ORDER — TRACE MINERALS CR-CU-MN-SE-ZN 10-1000-500-60 MCG/ML IV SOLN
INTRAVENOUS | Status: AC
  Administered 2018-01-08: 17:00:00 via INTRAVENOUS
  Filled 2018-01-08: qty 1800

## 2018-01-08 MED ORDER — FAT EMULSION PLANT BASED 20 % IV EMUL
120.0000 mL | INTRAVENOUS | Status: AC
  Administered 2018-01-08: 120 mL via INTRAVENOUS
  Filled 2018-01-08: qty 120

## 2018-01-08 MED ORDER — HYDROCORTISONE NA SUCCINATE PF 100 MG IJ SOLR
50.0000 mg | Freq: Two times a day (BID) | INTRAMUSCULAR | Status: DC
  Administered 2018-01-08 – 2018-01-09 (×2): 50 mg via INTRAVENOUS
  Filled 2018-01-08 (×2): qty 2

## 2018-01-08 NOTE — Progress Notes (Signed)
PHARMACY - ADULT TOTAL PARENTERAL NUTRITION CONSULT NOTE   Pharmacy Consult for TPN Management  Indication: colon perforation, NPO Status   Patient Measurements: Height: 4\' 11"  (149.9 cm) Weight: 199 lb 15.3 oz (90.7 kg) IBW/kg (Calculated) : 43.2 TPN AdjBW (KG): 50.5 Body mass index is 40.39 kg/m.   Assessment:  Pharmacy consulted for TPN management for 77 yo female with diffuse inflammation on distal jejunum extending to sigmoid colon with perforation.   TPN Access: PICC Triple Lumen  TPN start date: 12/30/17 Nutritional Goals (per RD recommendation on 7/26): KCal: 1500-1700 kcal  Protein: 87-102 grams  Fluid: > 1.5 L Goal TPN rate is 75 ml/hr (provides 1518 kcal and 90 grams of protein)  Current Nutrition:   Plan:  No electrolyte supplement required today.   Continue Clinimix E 5/15 at 24mL/hr on 7/29. ILE 20% at 23mL/hr. Patient is at goal   Add MVI, trace elements to TPN  SSI Q4hr. Will add 14 units of insulin to TPN again. May need to increase tomorrow.    Monitor TPN labs. Blood Glucose. Will recheck electrolytes on 8/5.  Patient extubated but NG/OG still in place. Per surgery they would not advance diet quickly. Continue TPN at goal today.    Pharmacy will continue to monitor and adjust per consult.   Laural Benes, PharmD, BCPS Clinical Pharmacist 01/08/2018 11:20 AM

## 2018-01-08 NOTE — Progress Notes (Signed)
CRITICAL CARE NOTE  CC  follow up POD 2 from exploratory laparotomy with lysis of adhesions, abdominal washout, splenic flexure takedown and extended left colon resection and colostomy  SUBJECTIVE Patient remains critically ill Prognosis is guarded  77 year old female who was admitted on 7/26 for perforated bowel secondary to diverticulitis. She is POD2 from exploratory laparotomy with lysis of adhesions, abdominal washout, splenic flexure takedown and extended left colon resection and colostomy. She remains intubated. This morning she is following commands and appears comfortable.    SIGNIFICANT EVENTS  Patient was successfully extubated, NG tube being removed and beginning oral intake  BP 108/67   Pulse (!) 102   Temp (!) 97.5 F (36.4 C) (Oral)   Resp 16   Ht 4\' 11"  (1.499 m)   Wt 199 lb 15.3 oz (90.7 kg)   SpO2 98%   BMI 40.39 kg/m   PHYSICAL EXAMINATION:  GENERAL:critically ill appearing, -resp distress HEAD: Normocephalic, atraumatic.  EYES: Pupils equal, round, reactive to light.  No scleral icterus.  MOUTH: Moist mucosal membrane. NECK: Supple. No thyromegaly. No nodules. No JVD.  PULMONARY: clear to auscultation bilaterally CARDIOVASCULAR: S1 and S2. Regular rate and irregular rhythm. No murmurs, rubs, or gallops.  GASTROINTESTINAL: Soft, non-distended, mild TTP along incision site. Laparotomy incision with wound vac in place. Left colostomy w/out drainage.   MUSCULOSKELETAL: Swelling of bilateral hands and feet.  NEUROLOGIC: alert and following verbal commands   ASSESSMENT AND PLAN SYNOPSIS  77 year old female with perforated bowel secondary to diverticulitis. Patient was successfully extubated yesterday. Doing well. We'll try to mobilize today out of bed as tolerated, per surgery NG tube be removed and started on oral intake. Presently on ertapenem. f patient's status remains stable will complete a seven-day course  Prerenal indices. Doing well, BUN 24 and  creatinine 0.62  Hyponatremia improved to 134  Leukocytosis at 18.4. On ertapenem  Anemia. No evidence of active bleeding  Atrial fibrillation. Presently under better control on amiodarone, Cardizem has been weaned off, does have when necessary metoprolol  Shock.Patient's pressors have been weaned off, will wean hydrocortisone   Patient ID: KALINDA ROMANIELLO, female   DOB: 07-Dec-1940, 77 y.o.   MRN: [id number] Patient ID: EBANY BOWERMASTER, female   DOB: September 09, 1940, 77 y.o.   MRN: [id number]

## 2018-01-08 NOTE — Progress Notes (Signed)
3 Days Post-Op  Subjective: Patient extubated but NG tube is still in place.  She is awake alert and alert.  Is been at bedside.  More pain.  Objective: Vital signs in last 24 hours: Temp:  [97.4 F (36.3 C)-98.3 F (36.8 C)] 97.5 F (36.4 C) (08/04 0400) Pulse Rate:  [94-130] 102 (08/04 0600) Resp:  [9-35] 16 (08/04 0600) BP: (90-161)/(53-118) 108/67 (08/04 0600) SpO2:  [93 %-100 %] 98 % (08/04 0600) Weight:  [199 lb 15.3 oz (90.7 kg)] 199 lb 15.3 oz (90.7 kg) (08/04 0500) Last BM Date: 01/05/18  Intake/Output from previous day: 08/03 0701 - 08/04 0700 In: 1867.8 [I.V.:1239.8; Blood:628] Out: 2650 [Urine:2650] Intake/Output this shift: No intake/output data recorded.  Physical exam:  Vital signs are stable and reviewed.  NG tube in place. Abdomen is soft wound is covered with wound VAC device.  No other changes to exam.  Lab Results: CBC  Recent Labs    01/07/18 1758 01/08/18 0519  WBC 30.6* 18.4*  HGB 10.5* 9.2*  HCT 30.6* 26.7*  PLT 391 306   BMET Recent Labs    01/07/18 0454 01/08/18 0519  NA 129* 134*  K 4.3 4.9  CL 97* 101  CO2 27 28  GLUCOSE 216* 279*  BUN 24* 25*  CREATININE 0.62 0.68  CALCIUM 7.2* 7.5*   PT/INR No results for input(s): LABPROT, INR in the last 72 hours. ABG Recent Labs    01/07/18 0418 01/07/18 1011  PHART 7.46* 7.38  HCO3 27.0 27.8    Studies/Results: No results found.  Anti-infectives: Anti-infectives (From admission, onward)   Start     Dose/Rate Route Frequency Ordered Stop   01/06/18 1800  ertapenem (INVANZ) 1,000 mg in sodium chloride 0.9 % 100 mL IVPB    Note to Pharmacy:  Discussed with ID and ok to proceed despite allergy   1 g 200 mL/hr over 30 Minutes Intravenous Every 24 hours 01/05/18 2004     01/05/18 1600  ertapenem (INVANZ) 1,000 mg in sodium chloride 0.9 % 100 mL IVPB  Status:  Discontinued    Note to Pharmacy:  Discussed with ID and ok to proceed despite allergy   1 g 200 mL/hr over 30 Minutes  Intravenous Every 24 hours 01/05/18 1557 01/05/18 2004   12/31/17 1000  levofloxacin (LEVAQUIN) IVPB 750 mg  Status:  Discontinued     750 mg 100 mL/hr over 90 Minutes Intravenous Every 24 hours 12/30/17 1258 01/05/18 1557   12/29/17 1500  levofloxacin (LEVAQUIN) IVPB 750 mg  Status:  Discontinued     750 mg 100 mL/hr over 90 Minutes Intravenous Every 48 hours 12/27/17 1340 12/28/17 0821   12/28/17 1200  levofloxacin (LEVAQUIN) IVPB 750 mg  Status:  Discontinued     750 mg 100 mL/hr over 90 Minutes Intravenous Every 24 hours 12/28/17 0821 12/30/17 1258   12/27/17 1400  aztreonam (AZACTAM) 1 g in sodium chloride 0.9 % 100 mL IVPB  Status:  Discontinued     1 g 200 mL/hr over 30 Minutes Intravenous Every 8 hours 12/27/17 1025 12/27/17 1222   12/27/17 1400  metroNIDAZOLE (FLAGYL) IVPB 500 mg  Status:  Discontinued     500 mg 100 mL/hr over 60 Minutes Intravenous Every 8 hours 12/27/17 1222 01/05/18 1557   12/27/17 1230  levofloxacin (LEVAQUIN) IVPB 750 mg  Status:  Discontinued     750 mg 100 mL/hr over 90 Minutes Intravenous Every 24 hours 12/27/17 1222 12/27/17 1340   12/27/17 1030  vancomycin (VANCOCIN) IVPB 1000 mg/200 mL premix     1,000 mg 200 mL/hr over 60 Minutes Intravenous  Once 12/27/17 1025 12/27/17 1548   12/27/17 1030  levofloxacin (LEVAQUIN) IVPB 750 mg  Status:  Discontinued     750 mg 100 mL/hr over 90 Minutes Intravenous  Once 12/27/17 1025 12/27/17 1352      Assessment/Plan: s/p Procedure(s): EXPLORATORY LAPAROTOMY/ POSSIBLE BOWEL OBSTRUCTION LYSIS OF ADHESION COLOSTOMY   White blood cell count is improved but remains elevated. Recent extubation.  I would not advance diet quickly but will be able to discontinue the nasogastric tube today and hopefully mobilize more.  Florene Glen, MD, FACS  01/08/2018

## 2018-01-09 ENCOUNTER — Encounter: Payer: Self-pay | Admitting: Surgery

## 2018-01-09 LAB — COMPREHENSIVE METABOLIC PANEL
ALT: 22 U/L (ref 0–44)
ANION GAP: 9 (ref 5–15)
AST: 32 U/L (ref 15–41)
Albumin: 1.9 g/dL — ABNORMAL LOW (ref 3.5–5.0)
Alkaline Phosphatase: 98 U/L (ref 38–126)
BUN: 29 mg/dL — ABNORMAL HIGH (ref 8–23)
CALCIUM: 7.4 mg/dL — AB (ref 8.9–10.3)
CHLORIDE: 96 mmol/L — AB (ref 98–111)
CO2: 27 mmol/L (ref 22–32)
CREATININE: 0.64 mg/dL (ref 0.44–1.00)
Glucose, Bld: 371 mg/dL — ABNORMAL HIGH (ref 70–99)
Potassium: 4.7 mmol/L (ref 3.5–5.1)
SODIUM: 132 mmol/L — AB (ref 135–145)
Total Bilirubin: 0.4 mg/dL (ref 0.3–1.2)
Total Protein: 4.6 g/dL — ABNORMAL LOW (ref 6.5–8.1)

## 2018-01-09 LAB — BLOOD GAS, ARTERIAL
Acid-Base Excess: 7.9 mmol/L — ABNORMAL HIGH (ref 0.0–2.0)
BICARBONATE: 32 mmol/L — AB (ref 20.0–28.0)
FIO2: 32
O2 SAT: 96.8 %
PATIENT TEMPERATURE: 37
PH ART: 7.49 — AB (ref 7.350–7.450)
pCO2 arterial: 42 mmHg (ref 32.0–48.0)
pO2, Arterial: 81 mmHg — ABNORMAL LOW (ref 83.0–108.0)

## 2018-01-09 LAB — GLUCOSE, CAPILLARY
GLUCOSE-CAPILLARY: 148 mg/dL — AB (ref 70–99)
GLUCOSE-CAPILLARY: 149 mg/dL — AB (ref 70–99)
GLUCOSE-CAPILLARY: 172 mg/dL — AB (ref 70–99)
Glucose-Capillary: 121 mg/dL — ABNORMAL HIGH (ref 70–99)
Glucose-Capillary: 136 mg/dL — ABNORMAL HIGH (ref 70–99)
Glucose-Capillary: 144 mg/dL — ABNORMAL HIGH (ref 70–99)
Glucose-Capillary: 170 mg/dL — ABNORMAL HIGH (ref 70–99)

## 2018-01-09 LAB — CBC WITH DIFFERENTIAL/PLATELET
BASOS ABS: 0 10*3/uL (ref 0–0.1)
Basophils Relative: 0 %
EOS ABS: 0.1 10*3/uL (ref 0–0.7)
EOS PCT: 0 %
HCT: 29.1 % — ABNORMAL LOW (ref 35.0–47.0)
HEMOGLOBIN: 9.7 g/dL — AB (ref 12.0–16.0)
Lymphocytes Relative: 3 %
Lymphs Abs: 0.4 10*3/uL — ABNORMAL LOW (ref 1.0–3.6)
MCH: 29.9 pg (ref 26.0–34.0)
MCHC: 33.3 g/dL (ref 32.0–36.0)
MCV: 89.7 fL (ref 80.0–100.0)
Monocytes Absolute: 1.1 10*3/uL — ABNORMAL HIGH (ref 0.2–0.9)
Monocytes Relative: 7 %
NEUTROS PCT: 90 %
Neutro Abs: 13.8 10*3/uL — ABNORMAL HIGH (ref 1.4–6.5)
PLATELETS: 365 10*3/uL (ref 150–440)
RBC: 3.25 MIL/uL — ABNORMAL LOW (ref 3.80–5.20)
RDW: 18 % — ABNORMAL HIGH (ref 11.5–14.5)
WBC: 15.4 10*3/uL — ABNORMAL HIGH (ref 3.6–11.0)

## 2018-01-09 LAB — MAGNESIUM: MAGNESIUM: 2.1 mg/dL (ref 1.7–2.4)

## 2018-01-09 LAB — PHOSPHORUS: PHOSPHORUS: 3.4 mg/dL (ref 2.5–4.6)

## 2018-01-09 LAB — HEMOGLOBIN A1C
HEMOGLOBIN A1C: 6.6 % — AB (ref 4.8–5.6)
Mean Plasma Glucose: 142.72 mg/dL

## 2018-01-09 LAB — SURGICAL PATHOLOGY

## 2018-01-09 LAB — BRAIN NATRIURETIC PEPTIDE: B Natriuretic Peptide: 2573 pg/mL — ABNORMAL HIGH (ref 0.0–100.0)

## 2018-01-09 LAB — PREALBUMIN: PREALBUMIN: 15.6 mg/dL — AB (ref 18–38)

## 2018-01-09 LAB — AMMONIA: Ammonia: 14 umol/L (ref 9–35)

## 2018-01-09 LAB — TRIGLYCERIDES: TRIGLYCERIDES: 123 mg/dL (ref <150)

## 2018-01-09 MED ORDER — TRACE MINERALS CR-CU-MN-SE-ZN 10-1000-500-60 MCG/ML IV SOLN
INTRAVENOUS | Status: AC
  Administered 2018-01-09: 16:00:00 via INTRAVENOUS
  Filled 2018-01-09: qty 1992

## 2018-01-09 MED ORDER — BOOST / RESOURCE BREEZE PO LIQD CUSTOM
1.0000 | Freq: Three times a day (TID) | ORAL | Status: DC
  Administered 2018-01-09: 1 via ORAL

## 2018-01-09 MED ORDER — APIXABAN 5 MG PO TABS
5.0000 mg | ORAL_TABLET | Freq: Two times a day (BID) | ORAL | Status: DC
  Administered 2018-01-09 – 2018-01-11 (×6): 5 mg via ORAL
  Filled 2018-01-09 (×6): qty 1

## 2018-01-09 MED ORDER — FUROSEMIDE 10 MG/ML IJ SOLN
20.0000 mg | Freq: Two times a day (BID) | INTRAMUSCULAR | Status: DC
Start: 2018-01-10 — End: 2018-01-11
  Administered 2018-01-10 (×2): 20 mg via INTRAVENOUS
  Filled 2018-01-09 (×2): qty 2

## 2018-01-09 MED ORDER — FUROSEMIDE 10 MG/ML IJ SOLN
40.0000 mg | Freq: Once | INTRAMUSCULAR | Status: AC
  Administered 2018-01-09: 40 mg via INTRAVENOUS

## 2018-01-09 MED ORDER — INSULIN GLARGINE 100 UNIT/ML ~~LOC~~ SOLN
6.0000 [IU] | Freq: Once | SUBCUTANEOUS | Status: AC
Start: 2018-01-09 — End: 2018-01-09
  Administered 2018-01-09: 6 [IU] via SUBCUTANEOUS
  Filled 2018-01-09: qty 0.06

## 2018-01-09 MED ORDER — FUROSEMIDE 10 MG/ML IJ SOLN: 20.0000 mg | Freq: Two times a day (BID) | INTRAMUSCULAR | Status: DC

## 2018-01-09 MED ORDER — FAT EMULSION PLANT BASED 20 % IV EMUL
250.0000 mL | INTRAVENOUS | Status: AC
  Administered 2018-01-09: 250 mL via INTRAVENOUS
  Filled 2018-01-09: qty 250

## 2018-01-09 NOTE — Progress Notes (Signed)
Pt being transferred to room 257. Report called to [place], Therapist, sports. Pt and belongings transferred to room 257 without incident.

## 2018-01-09 NOTE — NC FL2 (Signed)
[place] LEVEL OF CARE SCREENING TOOL     IDENTIFICATION  Patient Name: Jody [name] Birthdate: 1940-06-11 Sex: female Admission Date (Current Location): 12/27/2017  [place] and [place] Number:  Engineering geologist and Address:  [facility], [address] [place], [place] [address]      Provider Number: [id number]  Attending Physician Name and Address:  Benjamine Sprague, DO  Relative Name and Phone Number:       Current Level of Care: Hospital Recommended Level of Care: [place] Prior Approval Number:    Date Approved/Denied:   PASRR Number: [id number])  Discharge Plan: SNF    Current Diagnoses: Patient Active Problem List   Diagnosis Date Noted  . Diverticulitis of colon with perforation 12/27/2017  . Atrial fibrillation with RVR ([place]) 12/27/2017  . Malignant neoplasm of upper lobe of right lung ([place]) 12/31/2015  . 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 TI (tricuspid incompetence) 01/21/2015  . Anxiety 01/13/2015  . Clinical depression 01/13/2015  . 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 Borderline diabetes mellitus 01/13/2015  . Benign essential HTN 01/06/2015  . Essential (primary) hypertension 01/06/2015  . Atrial fibrillation, chronic ([place]) 05/20/2014  . 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 Chronic atrial fibrillation ([place]) 05/20/2014  . 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 Cough 11/20/2013  . Breath shortness 11/01/2013  . Breathlessness on exertion 10/30/2013    Orientation RESPIRATION BLADDER Height & Weight     Self  O2(4 Liters Oxygen. ) Incontinent Weight: 93.6 kg (206 lb 5.6 oz) Height:  4\' 11"  (149.9 cm)  BEHAVIORAL SYMPTOMS/MOOD NEUROLOGICAL BOWEL NUTRITION STATUS      Incontinent, Colostomy Diet(Diet: Clear Liquid to be advanced. )  AMBULATORY STATUS COMMUNICATION OF NEEDS Skin   Extensive Assist Verbally Surgical wounds, Wound Vac(Incicion: Abdomen, Wound Vac. )                        Personal Care Assistance Level of Assistance  Bathing, Feeding, Dressing Bathing Assistance: Limited assistance Feeding assistance: Limited assistance Dressing Assistance: Limited assistance     Functional Limitations Info  Sight, Hearing, Speech Sight Info: Adequate Hearing Info: Adequate Speech Info: Adequate    SPECIAL CARE FACTORS FREQUENCY  PT (By licensed PT), OT (By licensed OT)     PT Frequency: (5) OT Frequency: (5)            Contractures      Additional Factors Info  Code Status, Allergies Code Status Info: (DNR ) Allergies Info: (Poison Oak Extract, Ampicillin, Penicillin V Potassium)           Current Medications (01/09/2018):  This is the current hospital active medication list Current Facility-Administered Medications  Medication Dose Route Frequency Provider Last Rate Last Dose  . acetaminophen (TYLENOL) tablet 650 mg  650 mg Oral Q6H PRN Sakai, Isami, DO      . amiodarone (NEXTERONE PREMIX) 360-4.14 MG/200ML-% (1.8 mg/mL) IV infusion  30 mg/hr Intravenous Continuous Wilhelmina Mcardle, MD 16.7 mL/hr at 01/09/18 0800 30 mg/hr at 01/09/18 0800  . apixaban (ELIQUIS) tablet 5 mg  5 mg Oral BID Flora Lipps, MD      . chlorhexidine (PERIDEX) 0.12 % solution 15 mL  15 mL Mouth Rinse BID Conforti, John, DO   15 mL at 01/09/18 1042  . digoxin (LANOXIN) 0.25 MG/ML injection 0.125 mg  0.125 mg Intravenous Daily Callwood, Dwayne D, MD   0.125 mg at 01/09/18 1042  .  diltiazem (CARDIZEM) 100 mg in dextrose 5% 143mL (1 mg/mL) infusion  0-15 mg/hr Intravenous Titrated Wilhelmina Mcardle, MD   Stopped at 01/07/18 1419  . ertapenem (INVANZ) 1,000 mg in sodium chloride 0.9 % 100 mL IVPB  1 g Intravenous Q24H Sakai, Isami, DO   Stopped at 01/08/18 1752  . famotidine (PEPCID) IVPB 20 mg premix  20 mg Intravenous Q24H Sakai, Isami, DO 100 mL/hr at 01/09/18 1042 20 mg at 01/09/18 1042  . fentaNYL (SUBLIMAZE) injection 25 mcg  25 mcg Intravenous Q5 min PRN  Molli Barrows, MD   25 mcg at 01/09/18 0210  . fentaNYL 2543mcg in NS 262mL (60mcg/ml) infusion-PREMIX  0-400 mcg/hr Intravenous Continuous Flora Lipps, MD   Stopped at 01/07/18 [phone]  . insulin aspart (novoLOG) injection 0-15 Units  0-15 Units Subcutaneous Q4H Vira Blanco, [facility]   2 Units at 01/09/18 9741  . levalbuterol (XOPENEX) nebulizer solution 0.63 mg  0.63 mg Nebulization Q6H PRN Awilda Bill, NP   0.63 mg at 12/31/17 0846  . LORazepam (ATIVAN) injection 0.5 mg  0.5 mg Intravenous Q4H PRN Flora Lipps, MD   0.5 mg at 01/09/18 1041  . MEDLINE mouth rinse  15 mL Mouth Rinse q12n4p Conforti, John, DO   15 mL at 01/08/18 1210  . metoprolol tartrate (LOPRESSOR) injection 2.5-5 mg  2.5-5 mg Intravenous Q3H PRN Wilhelmina Mcardle, MD   2.5 mg at 01/09/18 0801  . metoprolol tartrate (LOPRESSOR) injection 5 mg  5 mg Intravenous Q6H Wilhelmina Mcardle, MD   5 mg at 01/09/18 0530  . morphine 2 MG/ML injection 2 mg  2 mg Intravenous Q3H PRN Awilda Bill, NP   2 mg at 01/09/18 1041  . norepinephrine (LEVOPHED) 4mg  in D5W 229mL premix infusion  0-40 mcg/min Intravenous Titrated Flora Lipps, MD   Stopped at 01/07/18 [phone]  . ondansetron (ZOFRAN) injection 4 mg  4 mg Intravenous Q4H PRN Flora Lipps, MD   4 mg at 01/03/18 1550  . promethazine (PHENERGAN) injection 25 mg  25 mg Intravenous Q6H PRN Conforti, John, DO   25 mg at 01/04/18 0114  . sodium chloride 0.9 % bolus 1,000 mL  1,000 mL Intravenous Once Awilda Bill, NP      . sodium chloride flush (NS) 0.9 % injection 10-40 mL  10-40 mL Intracatheter PRN Sakai, Isami, DO      . TPN (CLINIMIX-E) Adult   Intravenous Continuous TPN Sakai, Isami, DO 75 mL/hr at 01/09/18 0800       Discharge Medications: Please see discharge summary for a list of discharge medications.  Relevant Imaging Results:  Relevant Lab Results:   Additional Information (SSN: [id number])  Orit [place]ville, Veronia Beets, LCSW

## 2018-01-09 NOTE — Progress Notes (Signed)
CRITICAL CARE NOTE  CC  follow up bowel perforation secondary to diverticulitis with SBO.   SUBJECTIVE Prognosis is guarded  77 year old female who was admitted on 7/26 for perforated bowel secondary to diverticulitis. She is POD 4 from exploratory laparotomy with left colon resection and colostomy. She was extubated this weekend. NG removed yesterday. She appears drowsy this morning as she has just received some pain medication   SIGNIFICANT EVENTS Patient doing well overnight and starting to communicate to nursing staff when she is in pain. Did well with swallow screen yesterday, however not wanting to take anything by mouth. Will try to advance today.    BP 126/80   Pulse (!) 136   Temp 98.1 F (36.7 C) (Axillary)   Resp (!) 21   Ht 4\' 11"  (1.499 m)   Wt 206 lb 5.6 oz (93.6 kg)   SpO2 96%   BMI 41.68 kg/m    REVIEW OF SYSTEMS  PATIENT IS UNABLE TO PROVIDE COMPLETE REVIEW OF SYSTEM S DUE TO SEVERE CRITICAL ILLNESS AND ENCEPHALOPATHY   PHYSICAL EXAMINATION:  GENERAL:critically ill appearing, -resp distress HEAD: Normocephalic, atraumatic.  EYES: Pupils equal, round, reactive to light.  No scleral icterus.  MOUTH: Moist mucosal membrane. NECK: Supple. No thyromegaly. No nodules. No JVD.  PULMONARY:clear to auscultation bilaterally CARDIOVASCULAR: S1 and S2. Irregular rate and rhythm. No murmurs, rubs, or gallops.  GASTROINTESTINAL: soft, non-distended, non tender abdomen. Laparotomy incision wound vac in place. Left colostomy with minimal contents.  MUSCULOSKELETAL: 1+ pedal and bilateral hand edema.  NEUROLOGIC: alert and oriented, but drowsy SKIN:intact,warm,dry  ASSESSMENT AND PLAN SYNOPSIS  77 year old female with perforated bowel secondary to diverticulitis. POD 4 from exploratory laparotomy with left colon resection and colostomy formation. No bowel movement yet. Surgery is following. Patient remains on amiodarone drip and in afib. NG tube removed yesterday.  Patient continues to be on hydrocortisone following surgery, but has been weaned off levophed. Passed bedside swallow study yesterday, continue to advance as tolerated.    Pulmonary -prolonged mechanical intubation postoperatively -extubated 8/3 -doing well, 2L of O2 nasal cannula   AKI with lactic acidosis: -resolved -creatinine 0.64 -hyponatremia: 132  -fluid overload vs. Decreased sodium intake  -consider dose of lasix vs. sodium  HEMATOLOGY: -anemia resolved (1 unit PRBC given 8/3) -hemoglobin 9.7 this morning, stable -will continue to monitor   NEUROLOGY -alert and oriented, drowsy at times -acute encephalopathy secondary to intra abdominal infection  -resolving   CARDIAC -SD monitoring -Atrial fibrillation with RVR: on amiodarone drip, digoxin, metoprolol PRN  -titrated off of diltiazem drip on 8/3 -no further need for pressure support: hypotension resolved   ID -continue IV abx as prescribed: ertapenem -leukocytosis improving, still elevated: 30.6-->18.4-->15.4   DVT: SCDs  GI PRX: pepcid TRANSFUSIONS AS NEEDED MONITOR FSBS ASSESS the need for LABS as needed   PT/OT consult Follow up gen surg recs   Pharoah Goggins Patricia Pesa, M.D.  [facility]  Medical Director [place] Director [place] Department

## 2018-01-09 NOTE — Progress Notes (Signed)
Subjective:    Jody [name] is a 77 y.o. female  Hospital stay day 13, 4 Days Post-Op exlap, abdominal washout, hartmans  No issues overnight.  Tried some clears but had difficulty with swallowing per husband report.  Pt sleeping comfortably   Objective:      Temp:  [97.3 F (36.3 C)-98.2 F (36.8 C)] 98.2 F (36.8 C) (08/05 0500) Pulse Rate:  [84-129] 114 (08/05 0600) Resp:  [14-27] 21 (08/05 0600) BP: (97-151)/(62-96) 117/82 (08/05 0600) SpO2:  [94 %-100 %] 99 % (08/05 0600) Weight:  [93.6 kg (206 lb 5.6 oz)] 93.6 kg (206 lb 5.6 oz) (08/05 0500)     Height: 4\' 11"  (149.9 cm) Weight: 93.6 kg (206 lb 5.6 oz) BMI (Calculated): 41.66   Intake/Output this shift:  No intake/output data recorded.       Constitutional :  appears stated age and no distress  Gastrointestinal: soft, minimal tenderness to deep palpation along incision vac.  appropriate.  ostomy pink, moist, patent through fascia..  no significant output yet.   Skin: Cool and moist.   Psychiatric: Normal affect, non-agitated, not confused       LABS:  CMP Latest Ref Rng & Units 01/09/2018 01/08/2018 01/07/2018  Glucose 70 - 99 mg/dL 371(H) 279(H) 216(H)  BUN 8 - 23 mg/dL 29(H) 25(H) 24(H)  Creatinine 0.44 - 1.00 mg/dL 0.64 0.68 0.62  Sodium 135 - 145 mmol/L 132(L) 134(L) 129(L)  Potassium 3.5 - 5.1 mmol/L 4.7 4.9 4.3  Chloride 98 - 111 mmol/L 96(L) 101 97(L)  CO2 22 - 32 mmol/L 27 28 27   Calcium 8.9 - 10.3 mg/dL 7.4(L) 7.5(L) 7.2(L)  Total Protein 6.5 - 8.1 g/dL 4.6(L) - -  Total Bilirubin 0.3 - 1.2 mg/dL 0.4 - -  Alkaline Phos 38 - 126 U/L 98 - -  AST 15 - 41 U/L 32 - -  ALT 0 - 44 U/L 22 - -   CBC Latest Ref Rng & Units 01/09/2018 01/08/2018 01/07/2018  WBC 3.6 - 11.0 K/uL 15.4(H) 18.4(H) 30.6(H)  Hemoglobin 12.0 - 16.0 g/dL 9.7(L) 9.2(L) 10.5(L)  Hematocrit 35.0 - 47.0 % 29.1(L) 26.7(L) 30.6(L)  Platelets 150 - 440 K/uL 365 306 391    RADS: n/a Assessment:   exlap, abdominal washout, hartmans for perforated  diverticulitis.   hgb level stable s/p 1u pRBC.  Ok to resume oral anticoagulation from surgery standpoint.  Ok to gently continue advancing diet as tolerated as well.  Persistent but decreasing leukocytosis likely from steroid injection,  Doubt residual intra-abdominal infection at this point.  Will continue IV abx for full 7day course (end date 01/13/18) unless any changes in status.  crtiical care management per ICU

## 2018-01-09 NOTE — Progress Notes (Signed)
CRITICAL CARE NOTE  CC  follow up bowel perforation secondary to diverticulitis with SBO.   SUBJECTIVE Patient remains critically ill Prognosis is guarded  77 year old female who was admitted on 7/26 for perforated bowel secondary to diverticulitis. She is POD 4 from exploratory laparotomy with left colon resection and colostomy. She was extubated this weekend. NG removed yesterday. She appears drowsy this morning as she has just received some pain medication   SIGNIFICANT EVENTS Patient doing well overnight and starting to communicate to nursing staff when she is in pain. Did well with swallow screen yesterday, however not wanting to take anything by mouth. Will try to advance today.    BP 117/82   Pulse (!) 114   Temp 98.2 F (36.8 C) (Axillary)   Resp (!) 21   Ht 4\' 11"  (1.499 m)   Wt 93.6 kg (206 lb 5.6 oz)   SpO2 99%   BMI 41.68 kg/m    REVIEW OF SYSTEMS  PATIENT IS UNABLE TO PROVIDE COMPLETE REVIEW OF SYSTEM S DUE TO SEVERE CRITICAL ILLNESS AND ENCEPHALOPATHY   PHYSICAL EXAMINATION:  GENERAL:critically ill appearing, -resp distress HEAD: Normocephalic, atraumatic.  EYES: Pupils equal, round, reactive to light.  No scleral icterus.  MOUTH: Moist mucosal membrane. NECK: Supple. No thyromegaly. No nodules. No JVD.  PULMONARY:clear to auscultation bilaterally CARDIOVASCULAR: S1 and S2. Irregular rate and rhythm. No murmurs, rubs, or gallops.  GASTROINTESTINAL: soft, non-distended, non tender abdomen. Laparotomy incision wound vac in place. Left colostomy with minimal contents.  MUSCULOSKELETAL: 1+ pedal and bilateral hand edema.  NEUROLOGIC: alert and oriented, but drowsy SKIN:intact,warm,dry  ASSESSMENT AND PLAN SYNOPSIS  77 year old female with perforated bowel secondary to diverticulitis. POD 4 from exploratory laparotomy with left colon resection and colostomy formation. No bowel movement yet. Surgery is following. Patient remains on amiodarone drip and in  afib. NG tube removed yesterday. Patient continues to be on hydrocortisone following surgery, but has been weaned off levophed. Passed bedside swallow study yesterday, continue to advance as tolerated.    Pulmonary -prolonged mechanical intubation postoperatively -extubated 8/3 -doing well, 2L of O2 nasal cannula   AKI with lactic acidosis: -resolved -creatinine 0.64 -hyponatremia: 132  -fluid overload vs. Decreased sodium intake  -consider dose of lasix vs. sodium  HEMATOLOGY: -anemia resolved (1 unit PRBC given 8/3) -hemoglobin 9.7 this morning, stable -will continue to monitor   NEUROLOGY -alert and oriented, drowsy at times -acute encephalopathy secondary to intra abdominal infection  -resolving   CARDIAC -ICU monitoring -Atrial fibrillation with RVR: on amiodarone drip, digoxin, metoprolol PRN  -titrated off of diltiazem drip on 8/3 -no further need for pressure support: hypotension resolved   ID -continue IV abx as prescribed: ertapenem -leukocytosis improving, still elevated: 30.6-->18.4-->15.4   DVT: SCDs  GI PRX: pepcid TRANSFUSIONS AS NEEDED MONITOR FSBS ASSESS the need for LABS as needed   PT/OT consult  Critical Care Time devoted to patient care services described in this note is 34 minutes.   Overall, patient is critically ill, prognosis is guarded.

## 2018-01-09 NOTE — Progress Notes (Signed)
PHARMACY - ADULT TOTAL PARENTERAL NUTRITION CONSULT NOTE   Pharmacy Consult for TPN Management  Indication: colon perforation, NPO Status   Patient Measurements: Height: 4\' 11"  (149.9 cm) Weight: 206 lb 5.6 oz (93.6 kg) IBW/kg (Calculated) : 43.2 TPN AdjBW (KG): 50.5 Body mass index is 41.68 kg/m.   Assessment:  Pharmacy consulted for TPN management for 77 yo female with diffuse inflammation on distal jejunum extending to sigmoid colon with perforation.   TPN Access: PICC Triple Lumen  TPN start date: 12/30/17 Nutritional Goals (per RD recommendation on 7/26): KCal: 1500-1700 kcal  Protein: 87-102 grams  Fluid: > 1.5 L Goal TPN rate is 75 ml/hr (provides 1518 kcal and 90 grams of protein)  Current Nutrition:   Plan:  No electrolyte supplement required today.   Continue Clinimix E 5/15 at 18mL/hr. ILE 20% at 74mL/hr.   Add MVI, trace elements to TPN  SSI Q4hr. Lantus 6 units x 1.   Monitor TPN labs. Blood Glucose. Will recheck electrolytes on 8/6. Patient received furosemide 40mg  IV x 1.   Pharmacy will continue to monitor and adjust per consult.   Saulo Anthis L 01/09/2018 11:12 PM

## 2018-01-09 NOTE — Progress Notes (Signed)
Clinical Education officer, museum (CSW) attempted to meet with patient to discuss PT recommendation for SNF however patient was not alert and oriented and could not participate in assessment. Patient had no family or friends at bedside. CSW attempted to contact patient's husband Orpah Greek however he did not answer and a voicemail could not be left.  [employer], LCSW [phone]

## 2018-01-09 NOTE — Progress Notes (Signed)
Nutrition Follow-up  DOCUMENTATION CODES:   Not applicable  INTERVENTION:  Initiate new goal TPN regimen of Clinimix E 5/15 at 83 mL/hr + 20% ILE at 20 mL/hr over 12 hours. Provides 1894 kcal, 100 grams of protein, 1992 mL fluids daily (+240 mL lipids).  Continue adult MVI and trace elements as daily TPN additives.  Continue measuring daily weights.  Provide Boost Breeze po TID, each supplement provides 250 kcal and 9 grams of protein.  NUTRITION DIAGNOSIS:   Inadequate oral intake related to acute illness(colon perf) as evidenced by per patient/family report, NPO status.  Ongoing inadequate intake - addressing with TPN and diet advancement per surgery.  GOAL:   Provide needs based on ASPEN/SCCM guidelines  Met with TPN.  MONITOR:   I & O's, Labs  REASON FOR ASSESSMENT:   Consult New TPN/TNA  ASSESSMENT:   Patient with PMH of A-fib, Lung Cancer, Asthma, HTN, and borderline DM presents with abdominal pain. On CT she was noted to have diffuse inflammation of the distal jejenum extending to the descending/sigmoid colon with perforation which may be secondary to diverticulitis.    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 -Patient was extubated on 8/3. -NGT to LIS was discontinued on 8/4 and diet advanced to clears. Plan is for slow diet advancement.  Attempted to meet with patient at bedside but she was sleeping. Per discussion on rounds she has only taken bites of clear liquids so far because she is not very interested in eating. Still no colostomy output.  IV Access: right basilic PICC triple lumen placed 7/26; tip terminates at upper cavoatrial junction per chest x-ray 7/26  TPN: pt tolerating Clinimix E 5/15 at 75 mL/hr + 20% ILE at 10 mL/hr over 12 hours; 8/2-8/4 pt had 14 units of insulin in TPN  Medications reviewed and  include: Novolog 0-15 units Q4hrs, amiodarone gtt, famotidine.  Labs reviewed: CBG 119-172, Sodium 132, Chloride 96, BUN 29.  I/O: 2325 mL UOP yesterday (1 mL/kg/hr); 45 mL output from wound VAC  Weight trend: 93.6 kg on 8/5; wt continues to trend up with volume overload; now +21 kg from UBW  Discussed with RN and on rounds.  Diet Order:   Diet Order           Diet clear liquid Room service appropriate? Yes; Fluid consistency: Thin  Diet effective now          EDUCATION NEEDS:   Not appropriate for education at this time  Skin:  Skin Assessment: Skin Integrity Issues: Skin Integrity Issues:: Incisions Incisions: closed incision to abdomen with negative pressure wound therapy in place  Last BM:  01/05/2018 - large type 7; now with colostomy  Height:   Ht Readings from Last 1 Encounters:  12/27/17 _0  (1.499 m)    Weight:   Wt Readings from Last 1 Encounters:  01/09/18 206 lb 5.6 oz (93.6 kg)    Ideal Body Weight:  44.69 kg  BMI:  Body mass index is 41.68 kg/m.  Estimated Nutritional Needs:   Kcal:  1815-2180 (25-30 kcal/kg)  Protein:  87-102 grams (1.2-1.4g/kg)  Fluid:  1.8 L/day (25 mL/kg)  Willey Blade, MS, RD, LDN Office: [phone] Pager: [phone] After Hours/Weekend Pager: [phone]

## 2018-01-09 NOTE — H&P (Addendum)
[place] [facility] [place] NAME: Jody [name]    MR#:  [id number]  DATE OF BIRTH:  1940-06-08  DATE OF ADMISSION:  12/27/2017  PRIMARY CARE PHYSICIAN: Baxter Hire, MD   REQUESTING/REFERRING PHYSICIAN:   CHIEF COMPLAINT/reason for consult: Medical comanagement  HISTORY OF PRESENT ILLNESS: Jody [name]  is a 77 y.o. female with a known history per below, status post exploratory laparotomy/lysis of adhesions/abdominal washout/splenic flexure takedown/extended left colon resection with colostomy formation for bowel perforation due to diverticulitis on January 05, 2018 by general surgery, prolonged intubation-extubated on January 07, 2018, consulted by intensivist given transfer from ICU to regular nursing for bed, hospitalist asked to continue medical comanagement, patient evaluated at the bedside-patient is encephalopathic, per nursing staff was given Ativan/morphine approximately 7 hours ago for tachycardia believed to be due to pain, numerous family members at the bedside, patient is currently nonverbal, will follow commands intermittently in all extremities.  PAST MEDICAL HISTORY:   Past Medical History:  Diagnosis Date  . Asthma   . Atrial fibrillation ([place])   . Lung cancer ([place])    10 yrs ago    PAST SURGICAL HISTORY:  Past Surgical History:  Procedure Laterality Date  . COLOSTOMY N/A 01/05/2018   Procedure: COLOSTOMY;  Surgeon: Benjamine Sprague, DO;  Location: [facility] ORS;  Service: General;  Laterality: N/A;  . LAPAROTOMY N/A 01/05/2018   Procedure: EXPLORATORY LAPAROTOMY/ POSSIBLE BOWEL OBSTRUCTION;  Surgeon: Benjamine Sprague, DO;  Location: [facility] ORS;  Service: General;  Laterality: N/A;  . LYSIS OF ADHESION N/A 01/05/2018   Procedure: LYSIS OF ADHESION;  Surgeon: Benjamine Sprague, DO;  Location: [facility] ORS;  Service: General;  Laterality: N/A;    SOCIAL HISTORY:  Social History   Tobacco Use  . Smoking status: Never Smoker  . Smokeless tobacco: Never Used   Substance Use Topics  . Alcohol use: No    FAMILY HISTORY:  Family History  Problem Relation Age of Onset  . Breast cancer Paternal Aunt     DRUG ALLERGIES:  Allergies  Allergen Reactions  . Poison Oak Extract Other (See Comments)    blistering  . Ampicillin Rash  . Penicillin V Potassium Rash    Has patient had a PCN reaction causing immediate rash, facial/tongue/throat swelling, SOB or lightheadedness with hypotension: No Has patient had a PCN reaction causing severe rash involving mucus membranes or skin necrosis: No Has patient had a PCN reaction that required hospitalization: No Has patient had a PCN reaction occurring within the last 10 years: No If all of the above answers are "NO", then may proceed with Cephalosporin use.     REVIEW OF SYSTEMS: Unable to be obtained given encephalopathy  CONSTITUTIONAL: No fever, fatigue or weakness.  EYES: No blurred or double vision.  EARS, NOSE, AND THROAT: No tinnitus or ear pain.  RESPIRATORY: No cough, shortness of breath, wheezing or hemoptysis.  CARDIOVASCULAR: No chest pain, orthopnea, edema.  GASTROINTESTINAL: No nausea, vomiting, diarrhea or abdominal pain.  GENITOURINARY: No dysuria, hematuria.  ENDOCRINE: No polyuria, nocturia,  HEMATOLOGY: No anemia, easy bruising or bleeding SKIN: No rash or lesion. MUSCULOSKELETAL: No joint pain or arthritis.   NEUROLOGIC: No tingling, numbness, weakness.  PSYCHIATRY: No anxiety or depression.   MEDICATIONS AT HOME:  Prior to Admission medications   Medication Sig Start Date End Date Taking? Authorizing Provider  alendronate (FOSAMAX) 70 MG tablet Take 70 mg by mouth every Monday.    Yes [provider]  ALPRAZolam (  XANAX) 0.25 MG tablet Take 0.25 mg by mouth at bedtime as needed for anxiety or sleep.    Yes [provider]  apixaban (ELIQUIS) 5 MG TABS tablet Take 5 mg by mouth every 12 (twelve) hours.    Yes [provider]  Calcium  Carbonate-Vitamin D (CALCIUM 600+D) 600-200 MG-UNIT TABS Take 1 tablet by mouth daily.   Yes [provider]  diltiazem (DILACOR XR) 240 MG 24 hr capsule Take 240 mg by mouth daily.   Yes [provider]  FLUoxetine (PROZAC) 20 MG capsule Take 20 mg by mouth daily.  02/20/14  Yes [provider]  furosemide (LASIX) 20 MG tablet Take 20 mg by mouth daily.   Yes [provider]  lovastatin (MEVACOR) 20 MG tablet Take 20 mg by mouth daily with supper.    Yes [provider]  metoprolol succinate (TOPROL XL) 50 MG 24 hr tablet Take 1 tablet (50 mg total) by mouth daily. Take with or immediately following a meal. Patient taking differently: Take 50 mg by mouth 2 (two) times daily.  02/10/15 12/27/17 Yes Daymon Larsen, MD  montelukast (SINGULAIR) 10 MG tablet Take 10 mg by mouth at bedtime.  08/23/14  Yes [provider]  Multiple Vitamin (MULTI-VITAMINS) TABS Take 1 tablet by mouth daily.    Yes [provider]  omeprazole (PRILOSEC) 20 MG capsule Take 20 mg by mouth daily.  08/15/14  Yes [provider]  potassium chloride (K-DUR,KLOR-CON) 10 MEQ tablet Take 10 mEq by mouth daily.   Yes [provider]  metoprolol tartrate (LOPRESSOR) 25 MG tablet Take by mouth. 07/03/14 02/10/15  [provider]      PHYSICAL EXAMINATION:   VITAL SIGNS: Blood pressure 123/81, pulse (!) 101, temperature 97.9 F (36.6 C), temperature source Axillary, resp. rate (!) 21, height 4\' 11"  (1.499 m), weight 93.6 kg (206 lb 5.6 oz), SpO2 97 %.  GENERAL:  77 y.o.-year-old patient lying in the bed with no acute distress.  Extreme morbid obesity EYES: Pupils equal, round, reactive to light and accommodation. No scleral icterus. Extraocular muscles intact.  HEENT: Head atraumatic, normocephalic. Oropharynx and nasopharynx clear.  NECK:  Supple, no jugular venous distention. No thyroid enlargement, no tenderness.  LUNGS: Rhonchi bilaterally,  shallow breathing. No use of accessory muscles of respiration.  CARDIOVASCULAR: S1, S2 normal. No murmurs, rubs, or gallops.  ABDOMEN: Soft, nontender, nondistended. Bowel sounds present. No organomegaly or mass.  Surgical wound VAC in place, colostomy noted with minimal serosanguineous fluid noted in bag. EXTREMITIES: Noted anasarca, no cyanosis, or clubbing.  NEUROLOGIC: Cranial nerves II through XII are intact. MAES minimally. Gait not checked.  PSYCHIATRIC: The patient is encephalopathic, nonverbal, follows commands intermittently in all extremities   SKIN: No obvious rash, lesion, or ulcer.   LABORATORY PANEL:   CBC Recent Labs  Lab 01/05/18 0341 01/06/18 0757 01/07/18 0454 01/07/18 1758 01/08/18 0519 01/09/18 0502  WBC 17.5* 31.8* 24.8* 30.6* 18.4* 15.4*  HGB 10.5* 9.1* 7.7* 10.5* 9.2* 9.7*  HCT 32.1* 27.2* 23.1* 30.6* 26.7* 29.1*  PLT 268 379 297 391 306 365  MCV 82.2 81.5 80.9 82.1 81.5 89.7  MCH 26.8 27.4 26.8 28.2 28.0 29.9  MCHC 32.6 33.7 33.2 34.4 34.4 33.3  RDW 16.3* 16.6* 16.4* 15.9* 16.3* 18.0*  LYMPHSABS 0.4* 0.4* 0.3*  --  0.3* 0.4*  MONOABS 0.9 1.7* 1.1*  --  1.0* 1.1*  EOSABS 0.1 0.0 0.0  --  0.0 0.1  BASOSABS 0.2* 0.0  0.1  --  0.0 0.0   ------------------------------------------------------------------------------------------------------------------  Chemistries  Recent Labs  Lab 01/03/18 0421 01/04/18 0212 01/05/18 0341 01/06/18 0757 01/07/18 0454 01/08/18 0519 01/09/18 0502  NA 135 132* 133*  --  129* 134* 132*  K 3.5 3.7 4.0  --  4.3 4.9 4.7  CL 97* 95* 99  --  97* 101 96*  CO2 33* 32 29  --  27 28 27   GLUCOSE 242* 265* 198*  --  216* 279* 371*  BUN 17 21 20   --  24* 25* 29*  CREATININE 0.67 0.54 0.51  --  0.62 0.68 0.64  CALCIUM 7.7* 7.9* 7.7*  --  7.2* 7.5* 7.4*  MG 2.0 2.1 2.0 1.8 1.9 2.2 2.1  AST 17  --  12*  --   --   --  32  ALT 11  --  10  --   --   --  22  ALKPHOS 46  --  48  --   --   --  98  BILITOT 0.3  --  0.3  --   --   --   0.4   ------------------------------------------------------------------------------------------------------------------ estimated creatinine clearance is 57.1 mL/min (by C-G formula based on SCr of 0.64 mg/dL). ------------------------------------------------------------------------------------------------------------------ No results for input(s): TSH, T4TOTAL, T3FREE, THYROIDAB in the last 72 hours.  Invalid input(s): FREET3   Coagulation profile No results for input(s): INR, PROTIME in the last 168 hours. ------------------------------------------------------------------------------------------------------------------- No results for input(s): DDIMER in the last 72 hours. -------------------------------------------------------------------------------------------------------------------  Cardiac Enzymes No results for input(s): CKMB, TROPONINI, MYOGLOBIN in the last 168 hours.  Invalid input(s): CK ------------------------------------------------------------------------------------------------------------------ Invalid input(s): POCBNP  ---------------------------------------------------------------------------------------------------------------  Urinalysis    Component Value Date/Time   COLORURINE AMBER (A) 12/27/2017 1127   APPEARANCEUR HAZY (A) 12/27/2017 1127   LABSPEC >1.046 (H) 12/27/2017 1127   PHURINE 5.0 12/27/2017 1127   GLUCOSEU NEGATIVE 12/27/2017 1127   HGBUR SMALL (A) 12/27/2017 1127   BILIRUBINUR NEGATIVE 12/27/2017 1127   KETONESUR NEGATIVE 12/27/2017 1127   PROTEINUR 100 (A) 12/27/2017 1127   NITRITE NEGATIVE 12/27/2017 1127   LEUKOCYTESUR NEGATIVE 12/27/2017 1127     RADIOLOGY: No results found.  EKG: Orders placed or performed during the hospital encounter of 12/27/17  . EKG 12-Lead  . EKG 12-Lead  . ED EKG within 10 minutes  . ED EKG within 10 minutes    IMPRESSION AND PLAN: *Acute bowel perforation status post Exploratory lap with lysis  of adhesions, Hartman's procedure on November 05, 2017 Postoperative care per primary service, on empiric Invanz, continue TPN  *Acute toxic metabolic encephalopathy Exacerbated by morphine/Ativan Patient is obtunded, unable to tolerate p.o. Safely currently, clear diet ordered previously-feed with caution, continue TPN, neurochecks per routine, increase nursing care PRN, aspiration/fall/skin care precautions while in house, head of bed at 30 degrees at all times, speech therapy consulted, physical therapy to evaluate/treat, check ammonia level, blood gas  *Acute respiratory failure with hypoxia Prolonged postoperative intubation, extubated February 03, 2018 Supplemental oxygen with weaning as tolerated, check two-view chest x-ray in the morning, breathing treatments as needed  *A. fib with RVR Stable Continue amiodarone drip, digoxin-check level in the morning, Lopressor, and Eliquis Titrated off diltiazem drip in the ICU  *History of asthma, without exacerbation Stable breathing treatments as needed  *Acute anasarca Schedule IV Lasix twice daily, strict I&O monitoring, daily weights, repeat BNP level, check echocardiogram, BMP in the morning, continue close medical monitoring  *Hyperglycemia Sliding scale insulin with Accu-Cheks per routine, check hemoglobin  A1c determine level of control  *Chronic morbid obesity Stable  *Fluids/electrolytes/nutrition Continue TPN protocol for now until return of bowel functioning and improvement in pt sensorium  Condition guarded Prognosis poor GI prophylaxis-Pepcid IV DVT prophylaxis on Eliquis   All the records are reviewed and case discussed with ED provider. Management plans discussed with the patient, family and they are in agreement.  CODE STATUS:dnr    Code Status Orders  (From admission, onward)        Start     Ordered   12/27/17 1602  Do not attempt resuscitation (DNR)  Continuous    Question Answer Comment  In the event of  cardiac or respiratory ARREST Do not call a "code blue"   In the event of cardiac or respiratory ARREST Do not perform Intubation, CPR, defibrillation or ACLS   In the event of cardiac or respiratory ARREST Use medication by any route, position, wound care, and other measures to relive pain and suffering. May use oxygen, suction and manual treatment of airway obstruction as needed for comfort.      12/27/17 1601    Code Status History    Date Active Date Inactive Code Status Order ID Comments User Context   12/27/2017 1204 12/27/2017 1601 Full Code [id number]  Benjamine Sprague, DO ED   12/27/2017 1204 12/27/2017 1204 Full Code [id number]  Benjamine Sprague, DO ED    Advance Directive Documentation     Most Recent Value  Type of Advance Directive  Living will  Pre-existing out of facility DNR order (yellow form or pink MOST form)  -  "MOST" Form in Place?  -       TOTAL TIME TAKING CARE OF THIS PATIENT: 45 minutes.    [name] Salary M.D on 01/09/2018   Between 7am to 6pm - Pager - [phone]  After 6pm go to [email or web address] - password [email or web address] [facility]  Office  [phone]  CC: Primary care physician; Baxter Hire, MD   Note: This dictation was prepared with Dragon dictation along with smaller phrase technology. Any transcriptional errors that result from this process are unintentional.

## 2018-01-10 ENCOUNTER — Inpatient Hospital Stay
Admit: 2018-01-10 | Discharge: 2018-01-10 | Disposition: A | Discharge status: Home / Self Care | Payer: Medicare Other | Attending: Family Medicine | Admitting: Family Medicine

## 2018-01-10 ENCOUNTER — Inpatient Hospital Stay: Payer: Medicare Other

## 2018-01-10 LAB — BASIC METABOLIC PANEL
Anion gap: 7 (ref 5–15)
BUN: 32 mg/dL — AB (ref 8–23)
CALCIUM: 7.5 mg/dL — AB (ref 8.9–10.3)
CHLORIDE: 98 mmol/L (ref 98–111)
CO2: 31 mmol/L (ref 22–32)
CREATININE: 0.62 mg/dL (ref 0.44–1.00)
GFR calc Af Amer: >60 mL/min (ref >60)
Glucose, Bld: 135 mg/dL — ABNORMAL HIGH (ref 70–99)
Potassium: 4.4 mmol/L (ref 3.5–5.1)
Sodium: 136 mmol/L (ref 135–145)

## 2018-01-10 LAB — GLUCOSE, CAPILLARY
GLUCOSE-CAPILLARY: 123 mg/dL — AB (ref 70–99)
GLUCOSE-CAPILLARY: 156 mg/dL — AB (ref 70–99)
GLUCOSE-CAPILLARY: 180 mg/dL — AB (ref 70–99)
Glucose-Capillary: 156 mg/dL — ABNORMAL HIGH (ref 70–99)
Glucose-Capillary: 164 mg/dL — ABNORMAL HIGH (ref 70–99)
Glucose-Capillary: 148 mg/dL — ABNORMAL HIGH (ref 70–99)

## 2018-01-10 LAB — URINALYSIS, ROUTINE W REFLEX MICROSCOPIC
BILIRUBIN URINE: NEGATIVE
Glucose, UA: NEGATIVE mg/dL
HGB URINE DIPSTICK: NEGATIVE
KETONES UR: NEGATIVE mg/dL
Leukocytes, UA: NEGATIVE
Nitrite: NEGATIVE
Protein, ur: NEGATIVE mg/dL
SPECIFIC GRAVITY, URINE: 1.017 (ref 1.005–1.030)
pH: 5 (ref 5.0–8.0)

## 2018-01-10 LAB — CBC WITH DIFFERENTIAL/PLATELET
Basophils Absolute: 0 10*3/uL (ref 0–0.1)
Basophils Relative: 0 %
EOS PCT: 1 %
Eosinophils Absolute: 0.2 10*3/uL (ref 0–0.7)
HCT: 30.3 % — ABNORMAL LOW (ref 35.0–47.0)
Hemoglobin: 10.1 g/dL — ABNORMAL LOW (ref 12.0–16.0)
Lymphocytes Relative: 3 %
Lymphs Abs: 0.5 10*3/uL — ABNORMAL LOW (ref 1.0–3.6)
MCH: 27.4 pg (ref 26.0–34.0)
MCHC: 33.5 g/dL (ref 32.0–36.0)
MCV: 81.8 fL (ref 80.0–100.0)
MONO ABS: 1.2 10*3/uL — AB (ref 0.2–0.9)
Monocytes Relative: 7 %
Neutro Abs: 14.4 10*3/uL — ABNORMAL HIGH (ref 1.4–6.5)
Neutrophils Relative %: 89 %
Platelets: 423 10*3/uL (ref 150–440)
RBC: 3.7 MIL/uL — ABNORMAL LOW (ref 3.80–5.20)
RDW: 16.4 % — AB (ref 11.5–14.5)
WBC: 16.2 10*3/uL — ABNORMAL HIGH (ref 3.6–11.0)

## 2018-01-10 LAB — PHOSPHORUS: PHOSPHORUS: 3.6 mg/dL (ref 2.5–4.6)

## 2018-01-10 LAB — MAGNESIUM: Magnesium: 2 mg/dL (ref 1.7–2.4)

## 2018-01-10 MED ORDER — TRACE MINERALS CR-CU-MN-SE-ZN 10-1000-500-60 MCG/ML IV SOLN
INTRAVENOUS | Status: AC
  Administered 2018-01-10: 17:00:00 via INTRAVENOUS
  Filled 2018-01-10: qty 1992

## 2018-01-10 MED ORDER — NEPRO/CARBSTEADY PO LIQD
237.0000 mL | Freq: Two times a day (BID) | ORAL | Status: DC
  Administered 2018-01-10: 237 mL via ORAL

## 2018-01-10 MED ORDER — FAT EMULSION PLANT BASED 20 % IV EMUL
250.0000 mL | INTRAVENOUS | Status: AC
  Administered 2018-01-10: 250 mL via INTRAVENOUS
  Filled 2018-01-10: qty 250

## 2018-01-10 NOTE — Progress Notes (Signed)
Subjective:    Jody [name] is a 77 y.o. female  Hospital stay day 14, 5 Days Post-Op  exlap, abdominal washout, hartmans No issues overnight.  Transferred out of ICU to floor.  Husband thinks her mentation has not improved much.   Objective:      Temp:  [97.7 F (36.5 C)-98.5 F (36.9 C)] 98.5 F (36.9 C) (08/06 0801) Pulse Rate:  [101-138] 120 (08/06 0801) Resp:  [18-28] 20 (08/06 0801) BP: (112-137)/(66-84) 119/68 (08/06 0801) SpO2:  [96 %-99 %] 97 % (08/06 0801) Weight:  [90 kg (198 lb 8 oz)] 90 kg (198 lb 8 oz) (08/06 0406)     Height: 4\' 11"  (149.9 cm) Weight: 90 kg (198 lb 8 oz) BMI (Calculated): 40.07   Intake/Output this shift:  No intake/output data recorded.       Constitutional :  appears stated age, distracted and no distress  Gastrointestinal: soft, with some tenderness to palpation along incision.  incisional vac removed and staples noted to be intact, small bulge noted at superior aspect that is firm,  somewhat mobile, likley a hematoma.  no discharge noted from wound.  ostomy remains patent, but no gas or stool noted in bag..   Skin: Cool and moist. d  Psychiatric: Normal affect, non-agitated, not confused       LABS:  CMP Latest Ref Rng & Units 01/10/2018 01/09/2018 01/08/2018  Glucose 70 - 99 mg/dL 135(H) 371(H) 279(H)  BUN 8 - 23 mg/dL 32(H) 29(H) 25(H)  Creatinine 0.44 - 1.00 mg/dL 0.62 0.64 0.68  Sodium 135 - 145 mmol/L 136 132(L) 134(L)  Potassium 3.5 - 5.1 mmol/L 4.4 4.7 4.9  Chloride 98 - 111 mmol/L 98 96(L) 101  CO2 22 - 32 mmol/L 31 27 28   Calcium 8.9 - 10.3 mg/dL 7.5(L) 7.4(L) 7.5(L)  Total Protein 6.5 - 8.1 g/dL - 4.6(L) -  Total Bilirubin 0.3 - 1.2 mg/dL - 0.4 -  Alkaline Phos 38 - 126 U/L - 98 -  AST 15 - 41 U/L - 32 -  ALT 0 - 44 U/L - 22 -   CBC Latest Ref Rng & Units 01/10/2018 01/09/2018 01/08/2018  WBC 3.6 - 11.0 K/uL 16.2(H) 15.4(H) 18.4(H)  Hemoglobin 12.0 - 16.0 g/dL 10.1(L) 9.7(L) 9.2(L)  Hematocrit 35.0 - 47.0 % 30.3(L) 29.1(L)  26.7(L)  Platelets 150 - 440 K/uL 423 365 306    RADS: CLINICAL DATA:  Respiratory failure  EXAM: CHEST - 2 VIEW  COMPARISON:  12/30/2017  FINDINGS: Cardiac shadow remains enlarged. Right-sided PICC line is noted in the proximal superior vena cava. Patchy changes are again noted in the right suprahilar region. Mild bibasilar atelectasis with small bilateral pleural effusions are noted. No acute bony abnormality is noted.  IMPRESSION: Relatively stable appearance of the chest when compared with the prior exam.   Electronically Signed   By: Inez Catalina M.D.   On: 01/10/2018 07:30  Assessment:    Exlap, abdominal washout, hartmans.  She is at a very high risk of ileus due to fluid overload, extensive surgery, and diverticulitis.  Ok to slowly advance diet to see if we can stimulate bowels, but will need to continue to monitor closely.  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  Pt was receiving some IV steroids the past couple days, so could be from that but she is also at timeframe for new abscess formation.  If wbc persists and no other signs of infection (PICC line site clean today), will need  to consider repeat CT.  Will order repeat U/A today due to prolonged foley use as well.  Recommended keeping foley while continuing to diurese patient per medicine team.

## 2018-01-10 NOTE — Progress Notes (Signed)
*  PRELIMINARY RESULTS* Echocardiogram 2D Echocardiogram has been performed.  Sherrie Sport 01/10/2018, 3:39 PM

## 2018-01-10 NOTE — Progress Notes (Signed)
[place] at [place] NAME: Jody Jody [name]    MR#:  [id number]  DATE OF BIRTH:  [birth year]  SUBJECTIVE:   Patient has had prolonged hospitalization with intubation. She was transferred out of the ICU to regular floor yesterday.  Mental status has improved a bit.  She was encephalopathic yesterday due to Ativan and morphine.  REVIEW OF SYSTEMS:    Review of Systems  Constitutional: Negative for fever, chills weight loss Has generalized weakness HENT: Negative for ear pain, nosebleeds, congestion, facial swelling, rhinorrhea, neck pain, neck stiffness and ear discharge.   Respiratory: Negative for cough, shortness of breath, wheezing  Cardiovascular: Negative for chest pain, palpitations and positive leg swelling.  Gastrointestinal: Unrealized abdominal pain without constipation or diarrhea   genitourinary: Negative for dysuria, urgency, frequency, hematuria Musculoskeletal: Negative for back pain or joint pain Neurological: Negative for dizziness, seizures, syncope, focal weakness,  numbness and headaches.  Hematological: Does  bruise/bleed easily.  Psychiatric/Behavioral: Negative for hallucinations, positive confusion, negative dysphoric mood    Tolerating Diet: yes with TPN      DRUG ALLERGIES:   Allergies  Allergen Reactions  . Poison Oak Extract Other (See Comments)    blistering  . Ampicillin Rash  . Penicillin V Potassium Rash    Has patient had a PCN reaction causing immediate rash, facial/tongue/throat swelling, SOB or lightheadedness with hypotension: No Has patient had a PCN reaction causing severe rash involving mucus membranes or skin necrosis: No Has patient had a PCN reaction that required hospitalization: No Has patient had a PCN reaction occurring within the last 10 years: No If all of the above answers are "NO", then may proceed with Cephalosporin use.     VITALS:  Blood pressure 119/68, pulse (!) 120,  temperature 98.5 F (36.9 C), temperature source Oral, resp. rate 20, height 4\' 11"  (1.499 m), weight 198 lb 8 oz (90 kg), SpO2 97 %.  PHYSICAL EXAMINATION:  Constitutional: Appears beats with anasarca hENT: Normocephalic. Marland Kitchen Oropharynx is clear and moist.  Eyes: Conjunctivae and EOM are normal. PERRLA, no scleral icterus.  Neck: Normal ROM. Neck supple. No JVD. No tracheal deviation. CVS:irr, irr tachycardic S1/S2 +, no murmurs, no gallops, no carotid bruit.  Pulmonary: Effort and breath sounds normal, no stridor, rhonchi, wheezes, rales.  Abdominal: Staples are clean and intact.  No rebound or guarding.  Patient has ostomy  musculoskeletal: Normal range of motion. No edema and no tenderness.  Neuro: Alert. CN 2-12 grossly intact. No focal deficits. Skin: Skin is warm and dry. No rash noted. Psychiatric: Slow affect with very minimal confusion    LABORATORY PANEL:   CBC Recent Labs  Lab 01/10/18 0359  WBC 16.2*  HGB 10.1*  HCT 30.3*  PLT 423   ------------------------------------------------------------------------------------------------------------------  Chemistries  Recent Labs  Lab 01/09/18 0502 01/10/18 0359  NA 132* 136  K 4.7 4.4  CL 96* 98  CO2 27 31  GLUCOSE 371* 135*  BUN 29* 32*  CREATININE 0.64 0.62  CALCIUM 7.4* 7.5*  MG 2.1 2.0  AST 32  --   ALT 22  --   ALKPHOS 98  --   BILITOT 0.4  --    ------------------------------------------------------------------------------------------------------------------  Cardiac Enzymes No results for input(s): TROPONINI in the last 168 hours. ------------------------------------------------------------------------------------------------------------------  RADIOLOGY:  Dg Chest 2 View  Result Date: 01/10/2018 CLINICAL DATA:  Respiratory failure EXAM: CHEST - 2 VIEW COMPARISON:  12/30/2017 FINDINGS: Cardiac shadow remains enlarged. Right-sided PICC line is noted  in the proximal superior vena cava. Patchy changes  are again noted in the right suprahilar region. Mild bibasilar atelectasis with small bilateral pleural effusions are noted. No acute bony abnormality is noted. IMPRESSION: Relatively stable appearance of the chest when compared with the prior exam. Electronically Signed   By: Inez Catalina M.D.   On: 01/10/2018 07:30     ASSESSMENT AND PLAN:    77 year old Jody [name] with a history of atrial fibrillation chronic who is postoperative exploratory laparotomy with abdominal washout for perforated diverticulitis.   1.  Acute metabolic encephalopathy due to morphine and Ativan along with prolonged intubation and prolonged hospitalization: Patient is slowly improving.  2.  Acute hypoxic respiratory failure with prolonged postoperative intubation.  Patient was extubated August 3. Wean O2 as tolerated.  3.  A. fib with RVR: Continue amiodarone drip Continue digoxin and metoprolol Echocardiogram has been ordered 4.  Nutrition: Patient is currently on TPN and speech has seen the patient today and advance her diet.  5.  Anasarca from prolonged hospitalization/bedridden: Continue IV Lasix  6.  Perforated diverticulitis: Management as per surgery  Patient with overall very poor prognosis.  She has had prolonged hospitalization and prolonged intubation. Discussed with family at bedside.  Management plans discussed with the patient and family and they are in agreement.  CODE STATUS: DNR  TOTAL TIME TAKING CARE OF THIS PATIENT: 41minutes.     POSSIBLE D/C ??, DEPENDING ON CLINICAL CONDITION.   Jody Jody [name] M.D on 01/10/2018 at 10:58 AM  Between 7am to 6pm - Pager - [phone] After 6pm go to [email or web address] - password [email or web address] [place]  Office  [phone]  CC: Primary care physician; Jody Hire, MD  Note: This dictation was prepared with Dragon dictation along with smaller phrase technology. Any transcriptional errors that result from this process are  unintentional.

## 2018-01-10 NOTE — Evaluation (Signed)
Clinical/Bedside Swallow Evaluation Patient Details  Name: Jody [name] MRN: [id number] Date of Birth: 1941/01/08  Today's Date: 01/10/2018 Time: SLP Start Time (ACUTE ONLY): 5573 SLP Stop Time (ACUTE ONLY): 2202 SLP Time Calculation (min) (ACUTE ONLY): 60 min  Past Medical History:  Past Medical History:  Diagnosis Date  . Asthma   . Atrial fibrillation ([place])   . Lung cancer ([place])    10 yrs ago   Past Surgical History:  Past Surgical History:  Procedure Laterality Date  . COLOSTOMY N/A 01/05/2018   Procedure: COLOSTOMY;  Surgeon: Benjamine Sprague, DO;  Location: [facility] ORS;  Service: General;  Laterality: N/A;  . LAPAROTOMY N/A 01/05/2018   Procedure: EXPLORATORY LAPAROTOMY/ POSSIBLE BOWEL OBSTRUCTION;  Surgeon: Benjamine Sprague, DO;  Location: [facility] ORS;  Service: General;  Laterality: N/A;  . LYSIS OF ADHESION N/A 01/05/2018   Procedure: LYSIS OF ADHESION;  Surgeon: Benjamine Sprague, DO;  Location: [facility] ORS;  Service: General;  Laterality: N/A;   HPI:  Pt is a 77 y.o. female who was seen in ED for abdominal pain.  Symptoms were first noted 2 days ago. Pain is sharp, non-radiating, focal to abdomen.  Associated with nausea, exacerbated by palpation.  Pain continued to worsen so was brought by ambulance to ED.  Workup showed possible perforated diverticulitis so surgery consulted. She was also noted to be in Afib with RVR so was started on diltiazem drip.  She was admitted w/ dx of Diverticulitis with contained bowel perforation, sepsis, SBO.  Pt underwent suegical intervention and was orally intubated 8/1-01/08/2018,  Since then, mental status has improved a bit.  She has been encephalopathic and also given Ativan and morphine.  Currently, she was awake and saying few words but exhibiting decreased awareness oveall.    Assessment / Plan / Recommendation Clinical Impression  Pt appears to present w/ mild-mod oropharyngeal phase dyspahgia suspect significantly impacted by pt's declined Cognitive status and  attention/awareness w/ po tasks. Pt is at increased risk for aspiration/choking in this presentation. During all po trials, pt exhibited mod Oral phase deficits c/b overall decreased awareness to the task of po intake, bolus management, and swallowing/clearing. Pt required SLP to place bolus trials to/past lips into oral cavity. She then exhibited prolonged oral transit and oral holding. As liquid trials spilled posteriorly, pt had to swallow quickly to accomodate the spillage of trial posteriorly - Nectar consistency liquids (via Spoon to deliver a more controlled bolus amount) revealed a better response than thin liquids. Due to pt allowing bolus material to lay orally, mod verbal cues were given at times to encourage attention to boluses, and swallowing/clearing. Pt required full feeding assistance, cues. MD consulted and agreed w/ upgrade to foods in pt's diet. Recommended a Dysphagia level 1(PUREE) w/ NECTAR consistency liquids via Spoon w/ strict aspiration precautions and monitoring of pt's status; hold any po's if pt is not fully awake/alert to attend to po tasks. Feeding Support at all meals. Recommend Pills in Puree - Crushed as able.  SLP Visit Diagnosis: Dysphagia, oropharyngeal phase (R13.12)    Aspiration Risk  Mild aspiration risk;Moderate aspiration risk;Risk for inadequate nutrition/hydration    Diet Recommendation  Dysphagia level 1 (PUREE) w/ NECTAR consistency liquids; strict aspiration precautions and feeding support at all meals monitoring pt's status during po intake  Medication Administration: Crushed with puree(as able)    Other  Recommendations Recommended Consults: (Dietician f/u) Oral Care Recommendations: Oral care BID;Staff/trained caregiver to provide oral care Other Recommendations: Order thickener from pharmacy;Prohibited food (  jello, ice cream, thin soups);Remove water pitcher;Have oral suction available   Follow up Recommendations (TBD)      Frequency and Duration  min 3x week  2 weeks       Prognosis Prognosis for Safe Diet Advancement: Guarded(-Fair) Barriers to Reach Goals: Cognitive deficits;Severity of deficits      Swallow Study   General Date of Onset: 12/27/17 HPI: Pt is a 77 y.o. female who was seen in ED for abdominal pain.  Symptoms were first noted 2 days ago. Pain is sharp, non-radiating, focal to abdomen.  Associated with nausea, exacerbated by palpation.  Pain continued to worsen so was brought by ambulance to ED.  Workup showed possible perforated diverticulitis so surgery consulted. She was also noted to be in Afib with RVR so was started on diltiazem drip.  She was admitted w/ dx of Diverticulitis with contained bowel perforation, sepsis, SBO.  Pt underwent suegical intervention and was orally intubated 8/1-01/08/2018,  Since then, mental status has improved a bit.  She has been encephalopathic and also given Ativan and morphine.  Currently, she was awake and saying few words but exhibiting decreased awareness oveall.  Type of Study: Bedside Swallow Evaluation Previous Swallow Assessment: none reported Diet Prior to this Study: Thin liquids(Clear liquid diet) Temperature Spikes Noted: No(wbc 16.2) Respiratory Status: Nasal cannula(3 liters) History of Recent Intubation: Yes Length of Intubations (days): 4 days Date extubated: 01/08/18 Behavior/Cognition: Cooperative;Confused;Distractible;Requires cueing;Doesn't follow directions(awake but required verbal cues for follow through) Oral Cavity Assessment: Within Functional Limits(grossly) Oral Care Completed by SLP: Recent completion by staff Oral Cavity - Dentition: Adequate natural dentition Vision: (n/a) Self-Feeding Abilities: Total assist Patient Positioning: Upright in bed Baseline Vocal Quality: Normal(muttered/mumbled speech) Volitional Cough: Cognitively unable to elicit Volitional Swallow: Unable to elicit    Oral/Motor/Sensory Function Overall Oral Motor/Sensory Function:  (reduced motor movements overall; open mouth posture)   Ice Chips Ice chips: Impaired Presentation: Spoon(fed; 2 trials) Oral Phase Impairments: Poor awareness of bolus(reduced movement to attend to trials) Oral Phase Functional Implications: Prolonged oral transit;Oral holding Pharyngeal Phase Impairments: (none) Other Comments: verbal/tactile cues given   Thin Liquid Thin Liquid: Impaired Presentation: Straw(fed; via pinched straw - 2 trials) Oral Phase Impairments: Poor awareness of bolus(reduced movement to attend to trials) Oral Phase Functional Implications: Prolonged oral transit;Oral holding Pharyngeal  Phase Impairments: (none) Other Comments: swallowed quickly to accomodate spillage of trial posteriorly    Nectar Thick Nectar Thick Liquid: Impaired Presentation: Spoon(fed; 8 trials) Oral Phase Impairments: Poor awareness of bolus(reduced movement to attend to trials) Oral phase functional implications: Prolonged oral transit Pharyngeal Phase Impairments: (none) Other Comments: swallowed quickly to accomodate spillage of trial posteriorly but better response than w/ thin liquids   Honey Thick Honey Thick Liquid: Not tested   Puree Puree: Impaired Presentation: Spoon(fed; 6 trials) Oral Phase Impairments: Poor awareness of bolus(reduced movements to attend to trials) Oral Phase Functional Implications: Prolonged oral transit Pharyngeal Phase Impairments: (none)   Solid     Solid: Not tested      Orinda Kenner, MS, CCC-SLP Sandrika Schwinn 01/10/2018,4:46 PM

## 2018-01-10 NOTE — Progress Notes (Signed)
PHARMACY - ADULT TOTAL PARENTERAL NUTRITION CONSULT NOTE   Pharmacy Consult for TPN Management  Indication: colon perforation, NPO Status   Patient Measurements: Height: 4\' 11"  (149.9 cm) Weight: 198 lb 8 oz (90 kg) IBW/kg (Calculated) : 43.2 TPN AdjBW (KG): 50.5 Body mass index is 40.09 kg/m.   Assessment:  Pharmacy consulted for TPN management for 77 yo female with diffuse inflammation on distal jejunum extending to sigmoid colon with perforation.   TPN Access: PICC Triple Lumen  TPN start date: 12/30/17 Nutritional Goals (per RD recommendation on 7/26): KCal: 1500-1700 kcal  Protein: 87-102 grams  Fluid: > 1.5 L Goal TPN rate is 83 ml/hr  Current Nutrition:   Plan:  No electrolyte supplement required today.   Continue Clinimix E 5/15 at 65mL/hr. ILE 20% at 51mL/hr.   Continue MVI, trace elements to TPN  SSI moderate Q4hr.  Some elevated glucose may be steroid related. Hydrocortisone was discontinued yesterday.  Monitor TPN labs. Blood Glucose. Will recheck electrolytes per protocol.  Patient is now being diuresed with furosemide 20 mg IV BID due to volume overload - she had gained approximately 40 lbs since admission and was approximately 13 liters fluid positive according to RD.   Pharmacy will continue to monitor and adjust per consult.   Laural Benes, Pharm.D., BCPS Clinical Pharmacist 01/10/2018 7:19 AM

## 2018-01-10 NOTE — Care Management Important Message (Signed)
Copy of signed IM left with patient and family in room. 

## 2018-01-10 NOTE — Progress Notes (Signed)
  CC  follow up bowel perforation secondary to diverticulitis with SBO.  SUBJECTIVE  77 year old female who was admitted on 7/26 for perforated bowel secondary to diverticulitis. She is POD 5 from exploratory laparotomy with left colon resection and colostomy. Patient transferred to the floor yesterday. Remains on amiodarone drip with TPN. She continues to not want to eat or drink according to her husband. She is able to voice that she is not in pain this morning. She is lethargic, but follows commands.     BP 119/68 (BP Location: Left Arm)   Pulse (!) 120   Temp 98.5 F (36.9 C) (Oral)   Resp 20   Ht 4\' 11"  (1.499 m)   Wt 90 kg (198 lb 8 oz)   SpO2 97%   BMI 40.09 kg/m    REVIEW OF SYSTEMS  PATIENT IS UNABLE TO PROVIDE COMPLETE REVIEW OF SYSTEMS DUE TO ENCEPHALOPATHY   PHYSICAL EXAMINATION:  GENERAL:critically ill appearing, -resp distress HEAD: Normocephalic, atraumatic.  EYES: Pupils equal, round, reactive to light.  No scleral icterus.  MOUTH: Moist mucosal membrane. NECK: Supple. No thyromegaly. No nodules. No JVD.  PULMONARY: clear to auscultation bilaterally. No wheezes, rales, rhonchi CARDIOVASCULAR: S1 and S2. Irregular rate and rhythm. No murmurs, rubs, or gallops.  GASTROINTESTINAL: Laparotomy incision clean, dry, and intact. Ostomy with minimal contents in bag MUSCULOSKELETAL: Bilateral arm and leg swelling.  NEUROLOGIC: alert and oriented to name. Following verbal commands SKIN:intact,warm,dry  ASSESSMENT AND PLAN SYNOPSIS  77 year old female with perforated bowel secondary to diverticulitis; POD 5 from exploratory laparotomy with left colon resection and colostomy formation. Appreciate surgery and hospitalist following. Remains on amiodarone drip and TPN.    Gastrointestinal: -bowel perforation secondary to diverticulitis s/p laparotomy with colostomy formation.  -management per surgical service -wound vac removed  Pulmonary -prolonged mechanical  intubation post-operatively -extubated 8/3 -doing well, 4L of O2 nasal cannula -CXR: 8/6 shows continued bilateral pleural effusions with atelectasis.   RENAL: -hyponatremia improving: 136  -40 of lasix given yesterday, patient is 20kg above baseline weight -continue lasix   NEUROLOGY -acute encephalopathy secondary to intra-abdominal infection - alert and oriented to person -continues to have periods of lethargy and increased confusion, especially with morphine/ativan -ammonia normal   CARDIAC -telemetry -atrial fibrillation: on amiodarone drip, digoxin, and metoprolol  -blood pressure holding -eliquis restarted yesterday   ID -continue IV abx as prescribed: ertapenem -leukocytosis: 30.6-->18.4-->15.4-->16.2  -continue to trend  Continue PT/OT, advancing diet as mentation allows  DVT: eliquis GI PRX ordered TRANSFUSIONS AS NEEDED MONITOR FSBS ASSESS the need for LABS as needed   Overall, prognosis is guarded.

## 2018-01-10 NOTE — Progress Notes (Signed)
Patient's HR has been running Afib 110-140's. Patient receiving IV metoprolol 5mg  q6hrs, IV dig daily, and patient is currently on amio gtt at maintenance 16.7 ml/hr. BP 131/89, pulse 132; Dr. Ubaldo Glassing made aware. No new orders received. Will continue to monitor,

## 2018-01-11 ENCOUNTER — Encounter: Payer: Self-pay | Admitting: Radiology

## 2018-01-11 ENCOUNTER — Inpatient Hospital Stay: Payer: Medicare Other

## 2018-01-11 LAB — ECHOCARDIOGRAM COMPLETE
Height: 59 in
WEIGHTICAEL: 3176 [oz_av]

## 2018-01-11 LAB — GLUCOSE, CAPILLARY
GLUCOSE-CAPILLARY: 146 mg/dL — AB (ref 70–99)
GLUCOSE-CAPILLARY: 148 mg/dL — AB (ref 70–99)
GLUCOSE-CAPILLARY: 159 mg/dL — AB (ref 70–99)
GLUCOSE-CAPILLARY: 160 mg/dL — AB (ref 70–99)
GLUCOSE-CAPILLARY: 173 mg/dL — AB (ref 70–99)
GLUCOSE-CAPILLARY: 176 mg/dL — AB (ref 70–99)
Glucose-Capillary: 149 mg/dL — ABNORMAL HIGH (ref 70–99)
Glucose-Capillary: 166 mg/dL — ABNORMAL HIGH (ref 70–99)

## 2018-01-11 LAB — CBC WITH DIFFERENTIAL/PLATELET
BASOS ABS: 0.1 10*3/uL (ref 0–0.1)
BASOS PCT: 1 %
Eosinophils Absolute: 0.1 10*3/uL (ref 0–0.7)
Eosinophils Relative: 1 %
HEMATOCRIT: 28.5 % — AB (ref 35.0–47.0)
Hemoglobin: 9.8 g/dL — ABNORMAL LOW (ref 12.0–16.0)
Lymphocytes Relative: 3 %
Lymphs Abs: 0.4 10*3/uL — ABNORMAL LOW (ref 1.0–3.6)
MCH: 28.3 pg (ref 26.0–34.0)
MCHC: 34.3 g/dL (ref 32.0–36.0)
MCV: 82.6 fL (ref 80.0–100.0)
MONOS PCT: 6 %
Monocytes Absolute: 0.8 10*3/uL (ref 0.2–0.9)
NEUTROS PCT: 89 %
Neutro Abs: 12.5 10*3/uL — ABNORMAL HIGH (ref 1.4–6.5)
Platelets: 386 10*3/uL (ref 150–440)
RBC: 3.45 MIL/uL — AB (ref 3.80–5.20)
RDW: 16.2 % — ABNORMAL HIGH (ref 11.5–14.5)
WBC: 14 10*3/uL — ABNORMAL HIGH (ref 3.6–11.0)

## 2018-01-11 LAB — BASIC METABOLIC PANEL
ANION GAP: 6 (ref 5–15)
BUN: 31 mg/dL — AB (ref 8–23)
CO2: 34 mmol/L — AB (ref 22–32)
Calcium: 7.4 mg/dL — ABNORMAL LOW (ref 8.9–10.3)
Chloride: 96 mmol/L — ABNORMAL LOW (ref 98–111)
Creatinine, Ser: 0.59 mg/dL (ref 0.44–1.00)
GFR calc Af Amer: >60 mL/min (ref >60)
GFR calc non Af Amer: >60 mL/min (ref >60)
GLUCOSE: 151 mg/dL — AB (ref 70–99)
POTASSIUM: 4.1 mmol/L (ref 3.5–5.1)
Sodium: 136 mmol/L (ref 135–145)

## 2018-01-11 LAB — PHOSPHORUS: Phosphorus: 3.3 mg/dL (ref 2.5–4.6)

## 2018-01-11 LAB — MAGNESIUM: Magnesium: 2.1 mg/dL (ref 1.7–2.4)

## 2018-01-11 MED ORDER — TRACE MINERALS CR-CU-MN-SE-ZN 10-1000-500-60 MCG/ML IV SOLN: INTRAVENOUS | Status: DC

## 2018-01-11 MED ORDER — IOHEXOL 300 MG/ML  SOLN
100.0000 mL | Freq: Once | INTRAMUSCULAR | Status: AC | PRN
  Administered 2018-01-11: 100 mL via INTRAVENOUS

## 2018-01-11 MED ORDER — FAT EMULSION PLANT BASED 20 % IV EMUL
250.0000 mL | INTRAVENOUS | Status: AC
  Administered 2018-01-11: 250 mL via INTRAVENOUS
  Filled 2018-01-11: qty 250

## 2018-01-11 MED ORDER — FUROSEMIDE 10 MG/ML IJ SOLN
40.0000 mg | Freq: Two times a day (BID) | INTRAMUSCULAR | Status: DC
  Administered 2018-01-11 – 2018-01-17 (×13): 40 mg via INTRAVENOUS
  Filled 2018-01-11 (×13): qty 4

## 2018-01-11 MED ORDER — SODIUM CHLORIDE 0.9% FLUSH
10.0000 mL | INTRAVENOUS | Status: DC | PRN
  Administered 2018-01-14: 10 mL
  Administered 2018-01-15: 20 mL
  Filled 2018-01-11 (×2): qty 40

## 2018-01-11 MED ORDER — SODIUM CHLORIDE 0.9% FLUSH
10.0000 mL | Freq: Two times a day (BID) | INTRAVENOUS | Status: DC
  Administered 2018-01-11 – 2018-01-21 (×21): 10 mL

## 2018-01-11 MED ORDER — ENOXAPARIN SODIUM 100 MG/ML ~~LOC~~ SOLN
1.0000 mg/kg | Freq: Two times a day (BID) | SUBCUTANEOUS | Status: DC
Start: 2018-01-12 — End: 2018-01-14
  Administered 2018-01-12 – 2018-01-14 (×5): 90 mg via SUBCUTANEOUS
  Filled 2018-01-11 (×6): qty 1

## 2018-01-11 MED ORDER — IOPAMIDOL (ISOVUE-300) INJECTION 61%: 15.0000 mL | INTRAVENOUS | Status: AC

## 2018-01-11 MED ORDER — TRACE MINERALS CR-CU-MN-SE-ZN 10-1000-500-60 MCG/ML IV SOLN
INTRAVENOUS | Status: AC
  Administered 2018-01-11: 18:00:00 via INTRAVENOUS
  Filled 2018-01-11: qty 1680

## 2018-01-11 NOTE — Progress Notes (Signed)
ANTICOAGULATION CONSULT NOTE - Initial Consult  Pharmacy Consult for lovenox dosing Indication: atrial fibrillation  Allergies  Allergen Reactions  . Poison Oak Extract Other (See Comments)    blistering  . Ampicillin Rash  . Penicillin V Potassium Rash    Has patient had a PCN reaction causing immediate rash, facial/tongue/throat swelling, SOB or lightheadedness with hypotension: No Has patient had a PCN reaction causing severe rash involving mucus membranes or skin necrosis: No Has patient had a PCN reaction that required hospitalization: No Has patient had a PCN reaction occurring within the last 10 years: No If all of the above answers are "NO", then may proceed with Cephalosporin use.     Patient Measurements: Height: 4\' 11"  (149.9 cm) Weight: 195 lb 1.6 oz (88.5 kg) IBW/kg (Calculated) : 43.2 Enoxaparin Dosing Weight: 88.5 kg  Vital Signs: Temp: 97.6 F (36.4 C) (08/07 2030) Temp Source: Oral (08/07 2030) BP: 123/78 (08/07 2033) Pulse Rate: 132 (08/07 2033)  Labs: Recent Labs    01/09/18 0502 01/10/18 0359 01/11/18 0536  HGB 9.7* 10.1* 9.8*  HCT 29.1* 30.3* 28.5*  PLT 365 423 386  CREATININE 0.64 0.62 0.59    Estimated Creatinine Clearance: 55.2 mL/min (by C-G formula based on SCr of 0.59 mg/dL).   Medical History: Past Medical History:  Diagnosis Date  . Asthma   . Atrial fibrillation ([place])   . Lung cancer ([place])    10 yrs ago    Medications:  Eliquis d/c 8/7 PM.  Assessment:  Goal of Therapy:  Monitor platelets by anticoagulation protocol: Yes   Plan:  Lovenox 1 mg/kg q 12 hours ordered to start ~12 hours after last Eliquis dose. F/u labs per protocol.  Maven Varelas S 01/11/2018,11:00 PM

## 2018-01-11 NOTE — Plan of Care (Signed)
Patient is very weak unable to move extremities independently. Patient communication is improving with each shift. Offered snacks and drinks with medication, patient has declined. Patient has been medicated for pain X2. Heart rate continues to fluctuate not stable. Spouse is at the bedside. Patient has required coverage for every blood sugar POCT.

## 2018-01-11 NOTE — Progress Notes (Signed)
[place] [facility] [place] NAME: Jody [name]    MR#:  [id number]  DATE OF BIRTH:  01-02-1941  SUBJECTIVE:   Patient very weak this morning.  Mental status is similar to yesterday.  REVIEW OF SYSTEMS:    Review of Systems  Constitutional: Negative for fever, chills weight loss Has generalized weakness HENT: Negative for ear pain, nosebleeds, congestion, facial swelling, rhinorrhea, neck pain, neck stiffness and ear discharge.   Respiratory: Negative for cough, shortness of breath, wheezing  Cardiovascular: Negative for chest pain, palpitations and positive leg swelling.  Gastrointestinal: Denies abdominal pain without constipation or diarrhea   genitourinary: Negative for dysuria, urgency, frequency, hematuria Musculoskeletal: Negative for back pain or joint pain Neurological: Negative for dizziness, seizures, syncope, focal weakness,  numbness and headaches.  Hematological: Does  bruise/bleed easily.  Psychiatric/Behavioral: Negative for hallucinations, positive confusion, negative dysphoric mood    Tolerating Diet: yes with TPN      DRUG ALLERGIES:   Allergies  Allergen Reactions  . Poison Oak Extract Other (See Comments)    blistering  . Ampicillin Rash  . Penicillin V Potassium Rash    Has patient had a PCN reaction causing immediate rash, facial/tongue/throat swelling, SOB or lightheadedness with hypotension: No Has patient had a PCN reaction causing severe rash involving mucus membranes or skin necrosis: No Has patient had a PCN reaction that required hospitalization: No Has patient had a PCN reaction occurring within the last 10 years: No If all of the above answers are "NO", then may proceed with Cephalosporin use.     VITALS:  Blood pressure 111/69, pulse (!) 119, temperature 97.9 F (36.6 C), resp. rate 18, height 4\' 11"  (1.499 m), weight 195 lb 1.6 oz (88.5 kg), SpO2 95 %.  PHYSICAL EXAMINATION:  Constitutional:  Appears beats with anasarca hENT: Normocephalic. Marland Kitchen Oropharynx is clear and moist.  Eyes: Conjunctivae and EOM are normal. PERRLA, no scleral icterus.  Neck: Normal ROM. Neck supple. No JVD. No tracheal deviation. CVS:irr, irr tachycardic S1/S2 +, no murmurs, no gallops, no carotid bruit.  Pulmonary: Effort and breath sounds normal, no stridor, rhonchi, wheezes, rales.  Abdominal: Staples are clean and intact.  No rebound or guarding.  Patient has ostomy  musculoskeletal: Normal range of motion. No edema and no tenderness.  Neuro: Alert. CN 2-12 grossly intact. No focal deficits. Skin: Skin is warm and dry. No rash noted. Psychiatric: Slow affect with  confusion    LABORATORY PANEL:   CBC Recent Labs  Lab 01/11/18 0536  WBC 14.0*  HGB 9.8*  HCT 28.5*  PLT 386   ------------------------------------------------------------------------------------------------------------------  Chemistries  Recent Labs  Lab 01/09/18 0502  01/11/18 0536  NA 132*   < > 136  K 4.7   < > 4.1  CL 96*   < > 96*  CO2 27   < > 34*  GLUCOSE 371*   < > 151*  BUN 29*   < > 31*  CREATININE 0.64   < > 0.59  CALCIUM 7.4*   < > 7.4*  MG 2.1   < > 2.1  AST 32  --   --   ALT 22  --   --   ALKPHOS 98  --   --   BILITOT 0.4  --   --    < > = values in this interval not displayed.   ------------------------------------------------------------------------------------------------------------------  Cardiac Enzymes No results for input(s): TROPONINI in the last 168 hours. ------------------------------------------------------------------------------------------------------------------  RADIOLOGY:  Dg Chest 2 View  Result Date: 01/10/2018 CLINICAL DATA:  Respiratory failure EXAM: CHEST - 2 VIEW COMPARISON:  12/30/2017 FINDINGS: Cardiac shadow remains enlarged. Right-sided PICC line is noted in the proximal superior vena cava. Patchy changes are again noted in the right suprahilar region. Mild bibasilar  atelectasis with small bilateral pleural effusions are noted. No acute bony abnormality is noted. IMPRESSION: Relatively stable appearance of the chest when compared with the prior exam. Electronically Signed   By: Inez Catalina M.D.   On: 01/10/2018 07:30     ASSESSMENT AND PLAN:    77 year old female with a history of atrial fibrillation chronic who is postoperative exploratory laparotomy with abdominal washout for perforated diverticulitis.   1.  Acute metabolic encephalopathy due to morphine and Ativan along with prolonged intubation and prolonged hospitalization: Patient is slowly improving.  2.  Acute hypoxic respiratory failure with prolonged postoperative intubation.  Patient was extubated August 3. Wean O2 as tolerated.  3.  A. fib with RVR: Continue amiodarone drip Continue digoxin and metoprolol Echocardiogram has been ordered, results pending reconsult [facility] cardiology   4.  Nutrition: Patient is currently on TPN and dysphasia 1 diet  5.  Anasarca from prolonged hospitalization/bedridden: Continue IV Lasix She is +11.5 L   6.  Perforated diverticulitis: Management as per surgery  Patient with overall very poor prognosis.  She has had prolonged hospitalization and prolonged intubation. Discussed with family at bedside. CBC with mild improvement   Management plans discussed with the patient and family and they are in agreement.  CODE STATUS: DNR  TOTAL TIME TAKING CARE OF THIS PATIENT: 23 minutes.     POSSIBLE D/C ??, DEPENDING ON CLINICAL CONDITION.   Jody [name] M.D on 01/11/2018 at 10:50 AM  Between 7am to 6pm - Pager - [phone] After 6pm go to [email or web address] - password [email or web address] [facility]  Office  [phone]  CC: Primary care physician; Baxter Hire, MD  Note: This dictation was prepared with Dragon dictation along with smaller phrase technology. Any transcriptional errors that result from this process are unintentional.

## 2018-01-11 NOTE — Progress Notes (Signed)
PHARMACY - ADULT TOTAL PARENTERAL NUTRITION CONSULT NOTE   Pharmacy Consult for TPN Management  Indication: colon perforation, NPO Status   Patient Measurements: Height: 4\' 11"  (149.9 cm) Weight: 195 lb 1.6 oz (88.5 kg) IBW/kg (Calculated) : 43.2 TPN AdjBW (KG): 50.5 Body mass index is 39.41 kg/m.   Assessment:  Pharmacy consulted for TPN management for 77 yo female with diffuse inflammation on distal jejunum extending to sigmoid colon with perforation.   TPN Access: PICC Triple Lumen  TPN start date: 12/30/17 Nutritional Goals (per RD recommendation on 7/26): KCal: 1500-1700 kcal  Protein: 87-102 grams  Fluid: > 1.5 L Goal TPN rate is 83 ml/hr  Current Nutrition:   Plan:  No electrolyte supplement required today.   Reduce TPN rate due to volume overload -  Clinimix E 5/15 at 70 mL/hr. ILE 20% at 7mL/hr.  This TPN will supply approximately 84 g AA, 252 g dextrose, 48 g lipid which is approximately 1672 kcal.   Continue MVI, trace elements to TPN  SSI moderate Q4hr.  Some elevated glucose may be steroid related. Hydrocortisone was discontinued yesterday. Today glucose is better controlled.   Monitor TPN labs. Blood Glucose. Will recheck electrolytes per protocol.  Patient is now being diuresed with furosemide 40 mg IV BID due to volume overload - she had gained approximately 40 lbs since admission and was approximately 13 liters fluid positive according to RD. She is down approximately three pounds since yesterday, around 800 mL so far today (00:00 to 07:15), and about 3.5 liters total yesterday.   Pharmacy will continue to monitor and adjust per consult.   Laural Benes, Pharm.D., BCPS Clinical Pharmacist 01/11/2018 7:13 AM

## 2018-01-11 NOTE — Progress Notes (Signed)
[facility] Cardiology  SUBJECTIVE: Jody [name] is a 77 year old female who presented to the ED on 12/27/17 for abdominal pain, nausea, and constipation. Found to have diverticulitis with perforation. Patient was noted to be in atrial fibrillation with RVR in the ED and was put on Cardizem drip.   Today, patient is in and out of sleep. History is limited as patient appears uncomfortable and unable to answer any questions. Husband is at bedside and reports that she has only been able to mumble responses for the past few days.    Vitals:   01/11/18 0034 01/11/18 0339 01/11/18 0456 01/11/18 0800  BP: 127/63 122/72 120/71 111/69  Pulse: (!) 124 (!) 127 (!) 114 (!) 119  Resp:    18  Temp:  (!) 97.4 F (36.3 C) (!) 97.4 F (36.3 C) 97.9 F (36.6 C)  TempSrc:  Oral Oral   SpO2: 94% 94%  95%  Weight:   88.5 kg (195 lb 1.6 oz)   Height:         Intake/Output Summary (Last 24 hours) at 01/11/2018 1114 Last data filed at 01/11/2018 0700 Gross per 24 hour  Intake 3061.26 ml  Output 3720 ml  Net -658.74 ml      PHYSICAL EXAM  General: In and out of sleep. Appears uncomfortable  HEENT:  Normocephalic and atramatic Neck:  No JVD.  Lungs: Clear bilaterally to auscultation and percussion. Heart: Irregular rate, irregular rhythm. No murmurs, gallops or rubs.  Abdomen: Bowel sounds are positive, abdomen soft and very tender  Msk:  Back normal. Normal strength and tone for age. Gait not assessed.  Extremities: No clubbing, cyanosis or edema.   Neuro: Awake, but only mumbles responses  Psych:  Awake, but only mumbles responses    LABS: Basic Metabolic Panel: Recent Labs    01/10/18 0359 01/11/18 0536  NA 136 136  K 4.4 4.1  CL 98 96*  CO2 31 34*  GLUCOSE 135* 151*  BUN 32* 31*  CREATININE 0.62 0.59  CALCIUM 7.5* 7.4*  MG 2.0 2.1  PHOS 3.6 3.3   Liver Function Tests: Recent Labs    01/09/18 0502  AST 32  ALT 22  ALKPHOS 98  BILITOT 0.4  PROT 4.6*  ALBUMIN 1.9*   No results for  input(s): LIPASE, AMYLASE in the last 72 hours. CBC: Recent Labs    01/10/18 0359 01/11/18 0536  WBC 16.2* 14.0*  NEUTROABS 14.4* 12.5*  HGB 10.1* 9.8*  HCT 30.3* 28.5*  MCV 81.8 82.6  PLT 423 386   Cardiac Enzymes: No results for input(s): CKTOTAL, CKMB, CKMBINDEX, TROPONINI in the last 72 hours. BNP: Invalid input(s): POCBNP D-Dimer: No results for input(s): DDIMER in the last 72 hours. Hemoglobin A1C: Recent Labs    01/09/18 1834  HGBA1C 6.6*   Fasting Lipid Panel: Recent Labs    01/09/18 0500  TRIG 123   Thyroid Function Tests: No results for input(s): TSH, T4TOTAL, T3FREE, THYROIDAB in the last 72 hours.  Invalid input(s): FREET3 Anemia Panel: No results for input(s): VITAMINB12, FOLATE, FERRITIN, TIBC, IRON, RETICCTPCT in the last 72 hours.  Dg Chest 2 View  Result Date: 01/10/2018 CLINICAL DATA:  Respiratory failure EXAM: CHEST - 2 VIEW COMPARISON:  12/30/2017 FINDINGS: Cardiac shadow remains enlarged. Right-sided PICC line is noted in the proximal superior vena cava. Patchy changes are again noted in the right suprahilar region. Mild bibasilar atelectasis with small bilateral pleural effusions are noted. No acute bony abnormality is noted. IMPRESSION: Relatively stable  appearance of the chest when compared with the prior exam. Electronically Signed   By: Jody [name] M.D.   On: 01/10/2018 07:30    TELEMETRY: Atrial fibrillation with RVR  ASSESSMENT AND PLAN:  Active Problems:   Benign essential HTN   Atrial fibrillation, chronic (HCC)   Chemical diabetes   Borderline diabetes mellitus   Diverticulitis of colon with perforation   Atrial fibrillation with RVR ([place])    1. Atrial fibrillation with RVR 119   - Continue with Amiodarone IV infusion, digoxin .125mg  IV daily, metoprolol 5mg  q 6 hours.  Will attempt converting to po meds when rate improved.   - Continue monitoring blood pressure    - Continue Eliquis 5mg  twice daily   - Echocardiogram ordered   2. Acute hypoxic respiratory failure   - Wean oxygen as tolerated  3. Anasarca   - Continue with IV Lasix  4.  Perforated diverticulitis   - Continue management per surgery    Avie Arenas  PA-C 01/11/2018 11:14 AM

## 2018-01-11 NOTE — Progress Notes (Signed)
Patient's husband voices concern regarding patient's mental status, per husband before surgery on 8/1 patient was more alert, and talkative. Dr. Benjie Karvonen notified and received orders for CT of head. Will continue to monitor.

## 2018-01-11 NOTE — Progress Notes (Signed)
Subjective:    Jody [name] is a 77 y.o. female  Hospital stay day 15, 6 Days Post-Op Exlap, abdominal washout, hartmans.   No acute issues overnight. Husband states she is slightly more awake today, but still not eating even with encouragement.   Objective:      Temp:  [97.4 F (36.3 C)-98.4 F (36.9 C)] 97.9 F (36.6 C) (08/07 0800) Pulse Rate:  [114-132] 119 (08/07 0800) Resp:  [18-20] 18 (08/07 0800) BP: (108-131)/(63-98) 111/69 (08/07 0800) SpO2:  [94 %-98 %] 95 % (08/07 0800) Weight:  [88.5 kg (195 lb 1.6 oz)] 88.5 kg (195 lb 1.6 oz) (08/07 0456)     Height: 4\' 11"  (149.9 cm) Weight: 88.5 kg (195 lb 1.6 oz) BMI (Calculated): 39.38   Intake/Output this shift:  No intake/output data recorded.       Constitutional :  appears stated age and no distress  Respiratory:  clear to auscultation bilaterally  Cardiovascular:  irregularly irregular rhythm and regularly irregular rhythm  Gastrointestinal: soft, still minor tenderness along incision line, appropriate and improved from yesterday.  staples c/d/i. ostomy site still patent, no gas or BM noted in bag..   Skin: Cool and moist.   Psychiatric: Normal affect, non-agitated, not confused       LABS:  CMP Latest Ref Rng & Units 01/11/2018 01/10/2018 01/09/2018  Glucose 70 - 99 mg/dL 151(H) 135(H) 371(H)  BUN 8 - 23 mg/dL 31(H) 32(H) 29(H)  Creatinine 0.44 - 1.00 mg/dL 0.59 0.62 0.64  Sodium 135 - 145 mmol/L 136 136 132(L)  Potassium 3.5 - 5.1 mmol/L 4.1 4.4 4.7  Chloride 98 - 111 mmol/L 96(L) 98 96(L)  CO2 22 - 32 mmol/L 34(H) 31 27  Calcium 8.9 - 10.3 mg/dL 7.4(L) 7.5(L) 7.4(L)  Total Protein 6.5 - 8.1 g/dL - - 4.6(L)  Total Bilirubin 0.3 - 1.2 mg/dL - - 0.4  Alkaline Phos 38 - 126 U/L - - 98  AST 15 - 41 U/L - - 32  ALT 0 - 44 U/L - - 22   CBC Latest Ref Rng & Units 01/11/2018 01/10/2018 01/09/2018  WBC 3.6 - 11.0 K/uL 14.0(H) 16.2(H) 15.4(H)  Hemoglobin 12.0 - 16.0 g/dL 9.8(L) 10.1(L) 9.7(L)  Hematocrit 35.0 - 47.0 %  28.5(L) 30.3(L) 29.1(L)  Platelets 150 - 440 K/uL 386 423 365    RADS: CLINICAL DATA:  Peritonitis.  Weakness.  EXAM: CT ABDOMEN AND PELVIS WITH CONTRAST  TECHNIQUE: Multidetector CT imaging of the abdomen and pelvis was performed using the standard protocol following bolus administration of intravenous contrast.  CONTRAST:  ISOVUE-300 IOPAMIDOL (ISOVUE-300) INJECTION 61%, 135mL OMNIPAQUE IOHEXOL 300 MG/ML SOLN  COMPARISON:  Multiple prior recent CT scans. The most recent is 01/04/2018  FINDINGS: Lower chest: Persistent bilateral pleural effusions, right greater than left with overlying atelectasis. The heart is mildly enlarged but stable. No pericardial effusion. Stable moderate to large hiatal hernia.  Hepatobiliary: No focal hepatic lesions or intrahepatic biliary dilatation. The gallbladder is surgically absent. No common bile duct dilatation.  Pancreas: No mass, inflammation or ductal dilatation.  Spleen: Stable large partially calcified mass involving the spleen.  Adrenals/Urinary Tract: The adrenal glands are unremarkable and stable. No renal lesions. No hydroureteronephrosis. The bladder contains a Foley catheter. Moderate bladder wall thickening and high attenuation material could suggest infection or blood.  Stomach/Bowel: The stomach is grossly normal. Moderate-sized hiatal hernia. The duodenum is also normal. Mildly dilated small bowel loops with air-fluid levels but without obvious transition point. The distal and  terminal ileum are also slightly dilated. Findings more likely diffuse ileus than small-bowel obstruction. Definite improvement since the prior examination. 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 No complicating features are demonstrated.  Vascular/Lymphatic: Stable aortic and branch vessel calcifications but no aneurysm. The major venous structures are patent. Stable scattered mesenteric and  retroperitoneal lymph nodes but no mass or overt adenopathy.  Reproductive: Grossly normal and stable.  Other: Small scattered areas of free fluid in the abdomen without obvious rim enhancement to suggest abscess or peritonitis.  Diffuse body wall edema suggesting anasarca.  Musculoskeletal: No significant bony findings.  IMPRESSION: 1. Persistent bilateral pleural effusions with overlying atelectasis. 2. 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 No complicating features. 3. Improved appearance of the small bowel. There is persistent mild small bowel dilatation with air-fluid levels but no obvious discrete small-bowel obstruction. This could be a postoperative ileus. Recommend correlation with bowel sounds. 4. Small scattered abdominal fluid collections but no rim enhancing collections to suggest abscess or peritonitis. 5. Stable large calcified splenic mass. 6. Diffuse body wall edema suggesting anasarca. 7. Moderate-sized hiatal hernia.   Electronically Signed   By: Marijo Sanes M.D.   On: 01/11/2018 11:31 Assessment:   Exlap, abdominal washout, hartmans.  reviewed CT scan and noted extensive fluid overload as noted on previous exams.  Recommend continuing diuresis per cards and medical team.  Likely has ileus from fluid overload as well, but significant bowel dilation or report of vomiting.  Hold off on NG tube now and continue to encourage PO intake to help stimulate bowels.  Leukocytosis with slight improvement, no obvious source at this point.  No fever so continue to monitor at this point.  Staples out likely tomorrow

## 2018-01-11 NOTE — Clinical Social Work Note (Signed)
Clinical Social Work Assessment  Patient Details  Name: Jody [name] MRN: [id number] Date of Birth: 10/15/1940  Date of referral:  01/10/18               Reason for consult:  Facility Placement                Permission sought to share information with:  Facility Sport and exercise psychologist, Family Supports Permission granted to share information::  Yes, Verbal Permission Granted  Name::     Kelani, Robart [phone]   Agency::  SNF admissions  Relationship::     Contact Information:     Housing/Transportation Living arrangements for the past 2 months:  Single Family Home Source of Information:  Spouse Patient Interpreter Needed:  None Criminal Activity/Legal Involvement Pertinent to Current Situation/Hospitalization:  No - Comment as needed Significant Relationships:  Spouse Lives with:  Spouse Do you feel safe going back to the place where you live?  No Need for family participation in patient care:  Yes (Comment)  Care giving concerns:  Patient's family feels she needs some short term rehab before she is able to return back home.   Social Worker assessment / plan:  Patient is a 77 year old female, who is married and lives with her husband.  Patient is not able to talk very much, assessment completed by speaking with husband.  Patient's husband states that patient has not been to rehab before, CSW explained to patient what to expect and how insurance will pay for stay.  CSW discussed the process of looking for placement.  Patient's husband gave CSW permission to begin bed search in [place].  Patient's husband did not have any other questions or concerns.  Employment status:  Retired Nurse, adult PT Recommendations:  [place] / Referral to community resources:  [place]  Patient/Family's Response to care:  Patient's family is in agreement to going to SNF for short term rehab.  Patient/Family's  Understanding of and Emotional Response to Diagnosis, Current Treatment, and Prognosis: Patient's husband is hopeful that she will improve enough to go to SNF for rehab, and then return back home.  Emotional Assessment Appearance:  Appears stated age Attitude/Demeanor/Rapport:    Affect (typically observed):  Appropriate, Stable Orientation:  Oriented to Self Alcohol / Substance use:  Not Applicable Psych involvement (Current and /or in the community):  No (Comment)  Discharge Needs  Concerns to be addressed:  Lack of Support, Care Coordination, Cognitive Concerns Readmission within the last 30 days:  No Current discharge risk:  Cognitively Impaired, Lack of support system Barriers to Discharge:  Ship broker, Continued Medical Work up   [facility], [place] 01/11/2018, 9:35 AM

## 2018-01-12 ENCOUNTER — Encounter: Payer: Self-pay | Admitting: Internal Medicine

## 2018-01-12 DIAGNOSIS — Z515 Encounter for palliative care: Secondary | ICD-10-CM

## 2018-01-12 DIAGNOSIS — R410 Disorientation, unspecified: Secondary | ICD-10-CM

## 2018-01-12 DIAGNOSIS — Z7189 Other specified counseling: Secondary | ICD-10-CM

## 2018-01-12 LAB — CBC WITH DIFFERENTIAL/PLATELET
BASOS ABS: 0.1 10*3/uL (ref 0–0.1)
Basophils Relative: 1 %
EOS ABS: 0.1 10*3/uL (ref 0–0.7)
Eosinophils Relative: 1 %
HCT: 27.9 % — ABNORMAL LOW (ref 35.0–47.0)
Hemoglobin: 9.5 g/dL — ABNORMAL LOW (ref 12.0–16.0)
Lymphocytes Relative: 3 %
Lymphs Abs: 0.4 10*3/uL — ABNORMAL LOW (ref 1.0–3.6)
MCH: 27.8 pg (ref 26.0–34.0)
MCHC: 34 g/dL (ref 32.0–36.0)
MCV: 81.9 fL (ref 80.0–100.0)
Monocytes Absolute: 0.7 10*3/uL (ref 0.2–0.9)
Monocytes Relative: 5 %
Neutro Abs: 11.9 10*3/uL — ABNORMAL HIGH (ref 1.4–6.5)
Neutrophils Relative %: 90 %
PLATELETS: 385 10*3/uL (ref 150–440)
RBC: 3.4 MIL/uL — AB (ref 3.80–5.20)
RDW: 16.6 % — AB (ref 11.5–14.5)
WBC: 13.2 10*3/uL — AB (ref 3.6–11.0)

## 2018-01-12 LAB — COMPREHENSIVE METABOLIC PANEL
ALK PHOS: 112 U/L (ref 38–126)
ALT: 22 U/L (ref 0–44)
AST: 19 U/L (ref 15–41)
Albumin: 2 g/dL — ABNORMAL LOW (ref 3.5–5.0)
Anion gap: 6 (ref 5–15)
BILIRUBIN TOTAL: 0.3 mg/dL (ref 0.3–1.2)
BUN: 25 mg/dL — AB (ref 8–23)
CO2: 35 mmol/L — ABNORMAL HIGH (ref 22–32)
CREATININE: 0.48 mg/dL (ref 0.44–1.00)
Calcium: 7.3 mg/dL — ABNORMAL LOW (ref 8.9–10.3)
Chloride: 95 mmol/L — ABNORMAL LOW (ref 98–111)
GFR calc Af Amer: >60 mL/min (ref >60)
Glucose, Bld: 152 mg/dL — ABNORMAL HIGH (ref 70–99)
Potassium: 3.9 mmol/L (ref 3.5–5.1)
Sodium: 136 mmol/L (ref 135–145)
TOTAL PROTEIN: 4.6 g/dL — AB (ref 6.5–8.1)

## 2018-01-12 LAB — GLUCOSE, CAPILLARY
GLUCOSE-CAPILLARY: 148 mg/dL — AB (ref 70–99)
GLUCOSE-CAPILLARY: 137 mg/dL — AB (ref 70–99)
GLUCOSE-CAPILLARY: 137 mg/dL — AB (ref 70–99)
Glucose-Capillary: 173 mg/dL — ABNORMAL HIGH (ref 70–99)
Glucose-Capillary: 144 mg/dL — ABNORMAL HIGH (ref 70–99)

## 2018-01-12 LAB — PHOSPHORUS: Phosphorus: 3.3 mg/dL (ref 2.5–4.6)

## 2018-01-12 LAB — MAGNESIUM: MAGNESIUM: 1.9 mg/dL (ref 1.7–2.4)

## 2018-01-12 MED ORDER — TRACE MINERALS CR-CU-MN-SE-ZN 10-1000-500-60 MCG/ML IV SOLN
INTRAVENOUS | Status: AC
  Administered 2018-01-12: 18:00:00 via INTRAVENOUS
  Filled 2018-01-12: qty 1680

## 2018-01-12 MED ORDER — ACETAMINOPHEN 650 MG RE SUPP
650.0000 mg | Freq: Four times a day (QID) | RECTAL | Status: DC | PRN
Start: 2018-01-12 — End: 2018-01-22
  Administered 2018-01-13 – 2018-01-15 (×5): 650 mg via RECTAL
  Filled 2018-01-12 (×5): qty 1

## 2018-01-12 MED ORDER — FAT EMULSION PLANT BASED 20 % IV EMUL
250.0000 mL | INTRAVENOUS | Status: AC
  Administered 2018-01-12: 250 mL via INTRAVENOUS
  Filled 2018-01-12: qty 250

## 2018-01-12 NOTE — Progress Notes (Signed)
PT blood sugar at 2000 is at 137 as per nallely [facility]).Glucometer not sinking yet. Will continue to monitor.

## 2018-01-12 NOTE — Progress Notes (Signed)
Family Meeting Note  Advance Directive:yes  Today a meeting took place with the spouse.  Patient is unable to participate due QM:VHQION capacity Encephalopathy   The following clinical team members were present during this meeting:MD  The following were discussed:Patient's diagnosis: Acute hypoxic respiratory failure in the setting of acute combined systolic and diastolic heart failure, prolonged hospitalization after perforated diverticulitis.  Acute encephalopathy., Patient's progosis: Unable to determine and Goals for treatment: Palliative Care  Additional follow-up to be provided: Family agreeable for palliative care consultation.  Palliative care consult placed.  Patient not eating and remains encephalopathic  Time spent during discussion: 17 minutes  Jayveion Stalling, MD

## 2018-01-12 NOTE — Progress Notes (Signed)
PHARMACY - ADULT TOTAL PARENTERAL NUTRITION CONSULT NOTE   Pharmacy Consult for TPN Management  Indication: colon perforation, NPO Status   Patient Measurements: Height: 4\' 11"  (149.9 cm) Weight: 192 lb (87.1 kg) IBW/kg (Calculated) : 43.2 TPN AdjBW (KG): 50.5 Body mass index is 38.78 kg/m.   Assessment:  Pharmacy consulted for TPN management for 77 yo female with diffuse inflammation on distal jejunum extending to sigmoid colon with perforation.   TPN Access: PICC Triple Lumen  TPN start date: 12/30/17 Nutritional Goals (per RD recommendation on 7/26): KCal: 1500-1700 kcal  Protein: 87-102 grams  Fluid: > 1.5 L Goal TPN rate is 83 ml/hr  Current Nutrition:   Plan:  No electrolyte supplement required today.   Continue reduced TPN rate due to volume overload -  Clinimix E 5/15 at 70 mL/hr. ILE 20% at 48mL/hr.  This TPN will supply approximately 84 g AA, 252 g dextrose, 48 g lipid which is approximately 1672 kcal.   Continue MVI, trace elements to TPN  SSI moderate Q4hr.  Glucose control is better since stopping steroids  Monitor TPN labs. Blood Glucose. Will recheck electrolytes per protocol.  Patient is now being diuresed with furosemide 40 mg IV BID due to volume overload. TPN rate was also reduced on 01/11/18.   Palliative care consult was placed 01/12/18 by hospitalist.   Pharmacy will continue to monitor and adjust per consult.   Laural Benes, Pharm.D., BCPS Clinical Pharmacist 01/12/2018 10:21 AM

## 2018-01-12 NOTE — Progress Notes (Signed)
[facility] [place] NAME: Jody [name]    MR#:  [id number]  DATE OF BIRTH:  [birth year]  SUBJECTIVE:   Only at bedside.  Patient not eating.  Patient's mental status without much improvement. REVIEW OF SYSTEMS:    Review of Systems  Constitutional: Negative for fever, chills weight loss Has generalized weakness HENT: Negative for ear pain, nosebleeds, congestion, facial swelling, rhinorrhea, neck pain, neck stiffness and ear discharge.   Respiratory: Negative for cough, shortness of breath, wheezing  Cardiovascular: Negative for chest pain, palpitations and positive leg swelling.  Gastrointestinal: Denies abdominal pain without constipation or diarrhea   genitourinary: Negative for dysuria, urgency, frequency, hematuria Musculoskeletal: Negative for back pain or joint pain Neurological: Negative for dizziness, seizures, syncope, focal weakness,  numbness and headaches.  Hematological: Does  bruise/bleed easily.  Psychiatric/Behavioral: Negative for hallucinations, positive confusion, negative dysphoric mood    Tolerating Diet:  TPN She is not eating orally      DRUG ALLERGIES:   Allergies  Allergen Reactions  . Poison Oak Extract Other (See Comments)    blistering  . Ampicillin Rash  . Penicillin V Potassium Rash    Has patient had a PCN reaction causing immediate rash, facial/tongue/throat swelling, SOB or lightheadedness with hypotension: No Has patient had a PCN reaction causing severe rash involving mucus membranes or skin necrosis: No Has patient had a PCN reaction that required hospitalization: No Has patient had a PCN reaction occurring within the last 10 years: No If all of the above answers are "NO", then may proceed with Cephalosporin use.     VITALS:  Blood pressure 115/64, pulse (!) 110, temperature 97.7 F (36.5 C), temperature source Oral, resp. rate 18, height 4\' 11"  (1.499 m), weight 87.1 kg, SpO2 98  %.  PHYSICAL EXAMINATION:  Constitutional: Appears beats with anasarca hENT: Normocephalic. Marland Kitchen Oropharynx is clear and moist.  Eyes: Conjunctivae and EOM are normal. PERRLA, no scleral icterus.  Neck: Normal ROM. Neck supple. No JVD. No tracheal deviation. CVS:irr, irr tachycardic S1/S2 +, no murmurs, no gallops, no carotid bruit.  Pulmonary: Effort and breath sounds normal, no stridor, rhonchi, wheezes, rales.  Abdominal: Staples are clean and intact.  No rebound or guarding.  Patient has ostomy  musculoskeletal: Normal range of motion.  2+ edema and no tenderness.  Neuro: Alert confused and encephalopathic. CN 2-12 grossly intact. No focal deficits. Skin: Skin is warm and dry. No rash noted. Psychiatric: Slow affect with  confusion    LABORATORY PANEL:   CBC Recent Labs  Lab 01/12/18 0421  WBC 13.2*  HGB 9.5*  HCT 27.9*  PLT 385   ------------------------------------------------------------------------------------------------------------------  Chemistries  Recent Labs  Lab 01/12/18 0421  NA 136  K 3.9  CL 95*  CO2 35*  GLUCOSE 152*  BUN 25*  CREATININE 0.48  CALCIUM 7.3*  MG 1.9  AST 19  ALT 22  ALKPHOS 112  BILITOT 0.3   ------------------------------------------------------------------------------------------------------------------  Cardiac Enzymes No results for input(s): TROPONINI in the last 168 hours. ------------------------------------------------------------------------------------------------------------------  RADIOLOGY:  Ct Head Wo Contrast  Result Date: 01/11/2018 CLINICAL DATA:  Confusion for 79 week.  History of lung cancer. EXAM: CT HEAD WITHOUT CONTRAST TECHNIQUE: Contiguous axial images were obtained from the base of the skull through the vertex without intravenous contrast. COMPARISON:  CT HEAD report dated July 11, 1998 though images are not available for direct comparison. FINDINGS: BRAIN: No intraparenchymal hemorrhage, mass effect nor  midline shift. The ventricles  and sulci are normal for age. Patchy to confluent supratentorial white matter hypodensities most conspicuous within LEFT internal capsule. No acute large vascular territory infarcts. No abnormal extra-axial fluid collections. Basal cisterns are patent. Prominent posterior fossa extra-axial space, potential arachnoid cyst. VASCULAR: Moderate calcific atherosclerosis of the carotid siphons. SKULL: No skull fracture. Prominent occipital calvarium arachnoid granulations, normal. No significant scalp soft tissue swelling. SINUSES/ORBITS: Trace paranasal sinus mucosal thickening. Mastoid air cells are well aerated.The included ocular globes and orbital contents are non-suspicious. OTHER: None. IMPRESSION: 1. No acute intracranial process. 2. Moderate to severe chronic small vessel ischemic changes. Electronically Signed   By: Elon Alas M.D.   On: 01/11/2018 15:59   Ct Abdomen Pelvis W Contrast  Result Date: 01/11/2018 CLINICAL DATA:  Peritonitis.  Weakness. EXAM: CT ABDOMEN AND PELVIS WITH CONTRAST TECHNIQUE: Multidetector CT imaging of the abdomen and pelvis was performed using the standard protocol following bolus administration of intravenous contrast. CONTRAST:  ISOVUE-300 IOPAMIDOL (ISOVUE-300) INJECTION 61%, 132mL OMNIPAQUE IOHEXOL 300 MG/ML SOLN COMPARISON:  Multiple prior recent CT scans. The most recent is 01/04/2018 FINDINGS: Lower chest: Persistent bilateral pleural effusions, right greater than left with overlying atelectasis. The heart is mildly enlarged but stable. No pericardial effusion. Stable moderate to large hiatal hernia. Hepatobiliary: No focal hepatic lesions or intrahepatic biliary dilatation. The gallbladder is surgically absent. No common bile duct dilatation. Pancreas: No mass, inflammation or ductal dilatation. Spleen: Stable large partially calcified mass involving the spleen. Adrenals/Urinary Tract: The adrenal glands are unremarkable and stable. No  renal lesions. No hydroureteronephrosis. The bladder contains a Foley catheter. Moderate bladder wall thickening and high attenuation material could suggest infection or blood. Stomach/Bowel: The stomach is grossly normal. Moderate-sized hiatal hernia. The duodenum is also normal. Mildly dilated small bowel loops with air-fluid levels but without obvious transition point. The distal and terminal ileum are also slightly dilated. Findings more likely diffuse ileus than small-bowel obstruction. Definite improvement since the prior examination. 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 No complicating features are demonstrated. Vascular/Lymphatic: Stable aortic and branch vessel calcifications but no aneurysm. The major venous structures are patent. Stable scattered mesenteric and retroperitoneal lymph nodes but no mass or overt adenopathy. Reproductive: Grossly normal and stable. Other: Small scattered areas of free fluid in the abdomen without obvious rim enhancement to suggest abscess or peritonitis. Diffuse body wall edema suggesting anasarca. Musculoskeletal: No significant bony findings. IMPRESSION: 1. Persistent bilateral pleural effusions with overlying atelectasis. 2. 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 No complicating features. 3. Improved appearance of the small bowel. There is persistent mild small bowel dilatation with air-fluid levels but no obvious discrete small-bowel obstruction. This could be a postoperative ileus. Recommend correlation with bowel sounds. 4. Small scattered abdominal fluid collections but no rim enhancing collections to suggest abscess or peritonitis. 5. Stable large calcified splenic mass. 6. Diffuse body wall edema suggesting anasarca. 7. Moderate-sized hiatal hernia. Electronically Signed   By: Marijo Sanes M.D.   On: 01/11/2018 11:31     ASSESSMENT AND PLAN:    77 year old female with a history of atrial fibrillation chronic who is  postoperative exploratory laparotomy with abdominal washout for perforated diverticulitis.   1.  Acute metabolic encephalopathy due to morphine and Ativan along with prolonged intubation and prolonged hospitalization: Patient is slowly improving.  2.  Acute hypoxic respiratory failure with prolonged postoperative intubation and acute systolic and diastolic heart failure ejection fraction 40 to 45% by echocardiogram Continue IV Lasix  Monitor intake and output Patient was extubated August 3. Wean O2 as tolerated.  3.  A. fib with RVR: Continue amiodarone drip Continue digoxin and metoprolol [facility] cardiology consultation appreciated Continue lovenox   4.  Nutrition: Patient is currently on TPN and dysphasia 1 diet  5.  Anasarca from prolonged hospitalization/bedridden/acute heart failure: Continue IV Lasix    6.  Perforated diverticulitis/ileus: Management as per surgery  Patient with overall very poor prognosis.  She has had prolonged hospitalization and prolonged intubation. Discussed with family at bedside. Palliative care consult placed  CBC with mild improvement   Management plans discussed with the patient and family and they are in agreement.  CODE STATUS: DNR  TOTAL TIME TAKING CARE OF THIS PATIENT: 23 minutes.     POSSIBLE D/C ??, DEPENDING ON CLINICAL CONDITION.   Camdon Saetern M.D on 01/12/2018 at 11:18 AM  Between 7am to 6pm - Pager - [phone] After 6pm go to [email or web address] - password [email or web address] [facility]  Office  [phone]  CC: Primary care physician; Baxter Hire, MD  Note: This dictation was prepared with Dragon dictation along with smaller phrase technology. Any transcriptional errors that result from this process are unintentional.

## 2018-01-12 NOTE — Progress Notes (Signed)
Subjective:    Jody [name] is a 77 y.o. female  Hospital stay day 16, 7 Days Post-Op Exlap, abdominal washout, hartmans  No acute issues overnight.  Still not able to have decent conversation.  Husband states she does get agitated frequently because she wants to go home.   Objective:      Temp:  [97.6 F (36.4 C)-97.7 F (36.5 C)] 97.7 F (36.5 C) (08/08 0554) Pulse Rate:  [105-132] 105 (08/08 1603) Resp:  [18-23] 18 (08/08 1603) BP: (107-137)/(64-125) 107/71 (08/08 1603) SpO2:  [96 %-98 %] 97 % (08/08 1603) Weight:  [87.1 kg] 87.1 kg (08/08 0400)     Height: 4\' 11"  (149.9 cm) Weight: 87.1 kg BMI (Calculated): 38.76   Intake/Output this shift:  No intake/output data recorded.       Constitutional :  cooperative, appears stated age and no distress        Gastrointestinal: soft, minimal tenderness along incision line with staples intact.  ostomy now has some scant stool within bag, but no formed stoll or gas.   Skin: Cool and moist.   Psychiatric: Normal affect, non-agitated, not confused, answering some questions with one word answers       LABS:  CMP Latest Ref Rng & Units 01/12/2018 01/11/2018 01/10/2018  Glucose 70 - 99 mg/dL 152(H) 151(H) 135(H)  BUN 8 - 23 mg/dL 25(H) 31(H) 32(H)  Creatinine 0.44 - 1.00 mg/dL 0.48 0.59 0.62  Sodium 135 - 145 mmol/L 136 136 136  Potassium 3.5 - 5.1 mmol/L 3.9 4.1 4.4  Chloride 98 - 111 mmol/L 95(L) 96(L) 98  CO2 22 - 32 mmol/L 35(H) 34(H) 31  Calcium 8.9 - 10.3 mg/dL 7.3(L) 7.4(L) 7.5(L)  Total Protein 6.5 - 8.1 g/dL 4.6(L) - -  Total Bilirubin 0.3 - 1.2 mg/dL 0.3 - -  Alkaline Phos 38 - 126 U/L 112 - -  AST 15 - 41 U/L 19 - -  ALT 0 - 44 U/L 22 - -   CBC Latest Ref Rng & Units 01/12/2018 01/11/2018 01/10/2018  WBC 3.6 - 11.0 K/uL 13.2(H) 14.0(H) 16.2(H)  Hemoglobin 12.0 - 16.0 g/dL 9.5(L) 9.8(L) 10.1(L)  Hematocrit 35.0 - 47.0 % 27.9(L) 28.5(L) 30.3(L)  Platelets 150 - 440 K/uL 385 386 423    RADS: CLINICAL DATA:  Confusion for  1 week.  History of lung cancer.  EXAM: CT HEAD WITHOUT CONTRAST  TECHNIQUE: Contiguous axial images were obtained from the base of the skull through the vertex without intravenous contrast.  COMPARISON:  CT HEAD report dated July 11, 1998 though images are not available for direct comparison.  FINDINGS: BRAIN: No intraparenchymal hemorrhage, mass effect nor midline shift. The ventricles and sulci are normal for age. Patchy to confluent supratentorial white matter hypodensities most conspicuous within LEFT internal capsule. No acute large vascular territory infarcts. No abnormal extra-axial fluid collections. Basal cisterns are patent. Prominent posterior fossa extra-axial space, potential arachnoid cyst.  VASCULAR: Moderate calcific atherosclerosis of the carotid siphons.  SKULL: No skull fracture. Prominent occipital calvarium arachnoid granulations, normal. No significant scalp soft tissue swelling.  SINUSES/ORBITS: Trace paranasal sinus mucosal thickening. Mastoid air cells are well aerated.The included ocular globes and orbital contents are non-suspicious.  OTHER: None.  IMPRESSION: 1. No acute intracranial process. 2. Moderate to severe chronic small vessel ischemic changes.   Electronically Signed   By: Elon Alas M.D.   On: 01/11/2018 15:59 Assessment:   Exlap, abdominal washout, hartmans. CT head noted as well as progress notes from  palliative and primary re: encephalopathy.  I agree with all recommendations on minimizing all drugs that could potentially worsen her mental state.  Will recommend to continue to monitor for improvement since I think we are finally starting to reach the tail end of her recovery from the perforated diverticulitis.  Leukocytosis continuing to decrease so will d/c invanz today, since there is some rare reported cases of altered mental status from invanze.  The diuresis I believe is helping with return of bowel  function.  Still cant say ileus is resolved, but definitely an improvement today with the scant fecal matter in the bag.  Staples removed at bedside without any issues.  I also talked to RN, Danae Chen, avoid use of narcotics and anxiolytics as much as possible as well

## 2018-01-12 NOTE — Progress Notes (Signed)
SLP Cancellation Note  Patient Details Name: Jody [name] MRN: [id number] DOB: Nov 05, 1940   Cancelled treatment:       Reason Eval/Treat Not Completed: Fatigue/lethargy limiting ability to participate;Patient not medically ready(pt has exhibited declined swallowing status). Per NSG report, pt is holding po's in her mouth including medications. Pt has not been taking po's during meals; few tsps of liquids in past 2 days.  D/t pt's declined medical and Cognitive status for safe oral intake, recommend continued NPO status w/ a consult to Palliative Care to determine goals of care. Recommend oral care for hygiene and stimulation of swallowing; aspiration precautions.  ST services will f/u w/ pt's status next 1-2 days for any further assessment. NSG agreed. MD consulted.    Orinda Kenner, MS, CCC-SLP Watson,Katherine 01/12/2018, 3:45 PM

## 2018-01-12 NOTE — Consult Note (Addendum)
Consultation Note Date: 01/12/2018   Patient Name: Jody [name]  DOB: Jun 05, 1941  MRN: [id number]  Age / Sex: 77 y.o., female  PCP: Baxter Hire, MD Referring Physician: Bettey Costa, MD  Reason for Consultation: Establishing goals of care  HPI/Patient Profile: 77 y.o. female  with past medical history of asthma, h/o lung cancer ~10 yrs ago, HTN, atrial fibrillation, borderline DM admitted on 12/27/2017 from home via EMS with worsening abd pain over the past 2 days s/t perforated diverticulitis. Required surgical intervention 01/05/18 with left colon resection and colostomy. Post-op course complicated by continued encephalopathy and confusion and poor intake. She has not improved or progressed since surgery. Concern for aspiration per nursing the night of 01/11/18 and has been made NPO. Overall prognosis is poor.   Clinical Assessment and Goals of Care: I met today at Ms. Krammes's bedside with her husband, Orpah Greek. They have been married over 54 yrs and have 1 son. [place] and just celebrated their wedding anniversary ~1 week prior to admission. Orpah Greek shares that his wife was doing very well and they had just canned a bunch of tomatoes shortly before admission. He said she had slowed down a little bit since having atrial fibrillation over the past ~ 1 year; "although we are 77 yrs old too."   Orpah Greek is understandably concerned that his wife is not improving after surgery. He was hoping that after surgery she would be awake and talking with him and improving but he says he has seen little to no improvement. I explained that we are very concerned with her and that she has many obstacles and barriers to improvement. Discussed poor intake and that the TPN is a temporary measure. Orpah Greek is still hopeful that "she will turn around" and his main goal is to take her home (although to take her home improved at this  point is his goal). Although he does tell me that he does not want her to be in pain or suffering.   Emotional support provided. Orpah Greek will need continued palliative support.   Primary Decision Maker NEXT OF KIN husband Dwight    SUMMARY OF RECOMMENDATIONS   - DNR already in place - Family continues to be hopeful for improvement  Code Status/Advance Care Planning:  DNR   Symptom Management:   Encephalopathyacute delirium:  Pain: Recommend Tylenol as first line management. If Tylenol ineffective would recommend fentanyl 12.5 mg every 2 hours prn.   Agitation: She has been taking Xanax at home for past couple years per husband but only 0.25 mg at bedtime as needed. Avoid Ativan. For agitation would give haldol 1-2 mg every 6 hours prn.   If able to tolerate po intake I would recommend low dose Zyprexa 2.5 mg qhs if QTc < 0.45.   Avoid sedating medications.   Open blinds, turn on lights, sitting up and OOB if possible during daytime. Allow dark and quiet environment at nighttime.   Palliative Prophylaxis:   Aspiration, Bowel Regimen, Delirium Protocol, Frequent Pain  Assessment, Oral Care and Turn Reposition  Psycho-social/Spiritual:   Desire for further Chaplaincy support:yes  Additional Recommendations: Caregiving  Support/Resources  Prognosis:   Overall very poor. I would be surprised if she were able to return to eating and eat adequate amounts to sustain. Family desires to continue artificial nutrition/TPN for now.   Discharge Planning: To Be Determined      Primary Diagnoses: Present on Admission: . Diverticulitis of colon with perforation . Atrial fibrillation, chronic ([place]) . Chemical diabetes . Borderline diabetes mellitus . Benign essential HTN . Atrial fibrillation with RVR ([place])   I have reviewed the medical record, interviewed the patient and family, and examined the patient. The following aspects are pertinent.  Past Medical History:  Diagnosis  Date  . Asthma   . Atrial fibrillation ([place])   . CHF (congestive heart failure) ([place])   . Lung cancer ([place])    10 yrs ago   Social History   Socioeconomic History  . Marital status: Married    Spouse name: Not on file  . Number of children: Not on file  . Years of education: Not on file  . Highest education level: Not on file  Occupational History  . Not on file  Social Needs  . Financial resource strain: Not on file  . Food insecurity:    Worry: Not on file    Inability: Not on file  . Transportation needs:    Medical: Not on file    Non-medical: Not on file  Tobacco Use  . Smoking status: Never Smoker  . Smokeless tobacco: Never Used  Substance and Sexual Activity  . Alcohol use: No  . Drug use: Not on file  . Sexual activity: Not on file  Lifestyle  . Physical activity:    Days per week: Not on file    Minutes per session: Not on file  . Stress: Not on file  Relationships  . Social connections:    Talks on phone: Not on file    Gets together: Not on file    Attends religious service: Not on file    Active member of club or organization: Not on file    Attends meetings of clubs or organizations: Not on file    Relationship status: Not on file  Other Topics Concern  . Not on file  Social History Narrative  . Not on file   Family History  Problem Relation Age of Onset  . Breast cancer Paternal Aunt    Scheduled Meds: . chlorhexidine  15 mL Mouth Rinse BID  . digoxin  0.125 mg Intravenous Daily  . enoxaparin (LOVENOX) injection  1 mg/kg Subcutaneous Q12H  . feeding supplement (NEPRO CARB STEADY)  237 mL Oral BID BM  . furosemide  40 mg Intravenous BID  . insulin aspart  0-15 Units Subcutaneous Q4H  . mouth rinse  15 mL Mouth Rinse q12n4p  . metoprolol tartrate  5 mg Intravenous Q6H  . sodium chloride flush  10-40 mL Intracatheter Q12H   Continuous Infusions: . Marland KitchenTPN (CLINIMIX-E) Adult 70 mL/hr at 01/11/18 1920  . Marland KitchenTPN (CLINIMIX-E) Adult    .  amiodarone 30 mg/hr (01/12/18 1243)  . ertapenem Stopped (01/11/18 1757)  . famotidine (PEPCID) IV Stopped (01/12/18 0906)  . Fat emulsion    . sodium chloride     PRN Meds:.acetaminophen, fentaNYL (SUBLIMAZE) injection, levalbuterol, metoprolol tartrate, ondansetron (ZOFRAN) IV, promethazine, sodium chloride flush, sodium chloride flush Allergies  Allergen Reactions  . Poison Oak Extract Other (See  Comments)    blistering  . Ampicillin Rash  . Penicillin V Potassium Rash    Has patient had a PCN reaction causing immediate rash, facial/tongue/throat swelling, SOB or lightheadedness with hypotension: No Has patient had a PCN reaction causing severe rash involving mucus membranes or skin necrosis: No Has patient had a PCN reaction that required hospitalization: No Has patient had a PCN reaction occurring within the last 10 years: No If all of the above answers are "NO", then may proceed with Cephalosporin use.    Review of Systems  Unable to perform ROS: Acuity of condition    Physical Exam  Constitutional: She appears well-developed. She appears ill.  HENT:  Head: Normocephalic and atraumatic.  Cardiovascular: An irregularly irregular rhythm present. Tachycardia present.  Pulmonary/Chest: Effort normal. No accessory muscle usage. No tachypnea. No respiratory distress.  Abdominal: Soft.  + colostomy  Neurological: She is alert. She is disoriented.  Limited verbalization   Nursing note and vitals reviewed.   Vital Signs: BP 110/70   Pulse (!) 114   Temp 97.7 F (36.5 C) (Oral)   Resp 18   Ht 4' 11"  (1.499 m)   Wt 87.1 kg   SpO2 98%   BMI 38.78 kg/m  Pain Scale: 0-10 POSS *See Group Information*: S-Acceptable,Sleep, easy to arouse Pain Score: Asleep   SpO2: SpO2: 98 % O2 Device:SpO2: 98 % O2 Flow Rate: .O2 Flow Rate (L/min): 2 L/min  IO: Intake/output summary:   Intake/Output Summary (Last 24 hours) at 01/12/2018 1349 Last data filed at 01/12/2018 0700 Gross per 24  hour  Intake 2243.47 ml  Output 5250 ml  Net -3006.53 ml    LBM: Last BM Date: 01/08/18 Baseline Weight: Weight: 72.6 kg Most recent weight: Weight: 87.1 kg     Palliative Assessment/Data: 30%     Time Total: 60 min  Greater than 50%  of this time was spent counseling and coordinating care related to the above assessment and plan.  Signed by: Vinie Sill, NP Palliative Medicine Team Pager # [phone] (M-F 8a-5p) Team Phone # [phone] (Nights/Weekends)

## 2018-01-12 NOTE — Progress Notes (Signed)
[facility] Cardiology  SUBJECTIVE: Jody [name] is a 77 year old female who presented to the ED on 12/27/17 for abdominal pain, nausea, and constipation. Found to have diverticulitis with perforation. Patient was noted to be in atrial fibrillation with RVR in the ED and was put on Cardizem drip.   Today, patient is awake, but mumbles responses. Does deny any chest pain. The remainder of the history is limited as patient appears uncomfortable and unable to answer any questions.  Vitals:   01/12/18 0248 01/12/18 0400 01/12/18 0554 01/12/18 0741  BP: 134/75  (!) 137/125 115/64  Pulse: (!) 108  (!) 107 (!) 110  Resp:   (!) 23 18  Temp:   97.7 F (36.5 C)   TempSrc:   Oral   SpO2:   98% 98%  Weight:  87.1 kg    Height:         Intake/Output Summary (Last 24 hours) at 01/12/2018 0844 Last data filed at 01/12/2018 0603 Gross per 24 hour  Intake 1983.37 ml  Output 3550 ml  Net -1566.63 ml      PHYSICAL EXAM  General: Confused. Appears uncomfortable  HEENT:  Normocephalic and atramatic Neck:  No JVD.  Lungs: Clear bilaterally to auscultation and percussion. Heart: Irregular rate, irregular rhythm. No murmurs, gallops or rubs.  Abdomen: Bowel sounds are positive, abdomen soft and very tender  Msk:  Back normal. Normal strength and tone for age. Gait not assessed.  Extremities: No clubbing, cyanosis or edema.   Neuro: Awake, but only mumbles responses  Psych:  Awake, but only mumbles responses    LABS: Basic Metabolic Panel: Recent Labs    01/11/18 0536 01/12/18 0421  NA 136 136  K 4.1 3.9  CL 96* 95*  CO2 34* 35*  GLUCOSE 151* 152*  BUN 31* 25*  CREATININE 0.59 0.48  CALCIUM 7.4* 7.3*  MG 2.1 1.9  PHOS 3.3 3.3   Liver Function Tests: Recent Labs    01/12/18 0421  AST 19  ALT 22  ALKPHOS 112  BILITOT 0.3  PROT 4.6*  ALBUMIN 2.0*   No results for input(s): LIPASE, AMYLASE in the last 72 hours. CBC: Recent Labs    01/11/18 0536 01/12/18 0421  WBC 14.0* 13.2*   NEUTROABS 12.5* 11.9*  HGB 9.8* 9.5*  HCT 28.5* 27.9*  MCV 82.6 81.9  PLT 386 385   Cardiac Enzymes: No results for input(s): CKTOTAL, CKMB, CKMBINDEX, TROPONINI in the last 72 hours. BNP: Invalid input(s): POCBNP D-Dimer: No results for input(s): DDIMER in the last 72 hours. Hemoglobin A1C: Recent Labs    01/09/18 1834  HGBA1C 6.6*   Fasting Lipid Panel: No results for input(s): CHOL, HDL, LDLCALC, TRIG, CHOLHDL, LDLDIRECT in the last 72 hours. Thyroid Function Tests: No results for input(s): TSH, T4TOTAL, T3FREE, THYROIDAB in the last 72 hours.  Invalid input(s): FREET3 Anemia Panel: No results for input(s): VITAMINB12, FOLATE, FERRITIN, TIBC, IRON, RETICCTPCT in the last 72 hours.  Ct Head Wo Contrast  Result Date: 01/11/2018 CLINICAL DATA:  Confusion for 1 week.  History of lung cancer. EXAM: CT HEAD WITHOUT CONTRAST TECHNIQUE: Contiguous axial images were obtained from the base of the skull through the vertex without intravenous contrast. COMPARISON:  CT HEAD report dated July 11, 1998 though images are not available for direct comparison. FINDINGS: BRAIN: No intraparenchymal hemorrhage, mass effect nor midline shift. The ventricles and sulci are normal for age. Patchy to confluent supratentorial white matter hypodensities most conspicuous within LEFT internal capsule. No  acute large vascular territory infarcts. No abnormal extra-axial fluid collections. Basal cisterns are patent. Prominent posterior fossa extra-axial space, potential arachnoid cyst. VASCULAR: Moderate calcific atherosclerosis of the carotid siphons. SKULL: No skull fracture. Prominent occipital calvarium arachnoid granulations, normal. No significant scalp soft tissue swelling. SINUSES/ORBITS: Trace paranasal sinus mucosal thickening. Mastoid air cells are well aerated.The included ocular globes and orbital contents are non-suspicious. OTHER: None. IMPRESSION: 1. No acute intracranial process. 2. Moderate to  severe chronic small vessel ischemic changes. Electronically Signed   By: Elon Alas M.D.   On: 01/11/2018 15:59   Ct Abdomen Pelvis W Contrast  Result Date: 01/11/2018 CLINICAL DATA:  Peritonitis.  Weakness. EXAM: CT ABDOMEN AND PELVIS WITH CONTRAST TECHNIQUE: Multidetector CT imaging of the abdomen and pelvis was performed using the standard protocol following bolus administration of intravenous contrast. CONTRAST:  ISOVUE-300 IOPAMIDOL (ISOVUE-300) INJECTION 61%, 153mL OMNIPAQUE IOHEXOL 300 MG/ML SOLN COMPARISON:  Multiple prior recent CT scans. The most recent is 01/04/2018 FINDINGS: Lower chest: Persistent bilateral pleural effusions, right greater than left with overlying atelectasis. The heart is mildly enlarged but stable. No pericardial effusion. Stable moderate to large hiatal hernia. Hepatobiliary: No focal hepatic lesions or intrahepatic biliary dilatation. The gallbladder is surgically absent. No common bile duct dilatation. Pancreas: No mass, inflammation or ductal dilatation. Spleen: Stable large partially calcified mass involving the spleen. Adrenals/Urinary Tract: The adrenal glands are unremarkable and stable. No renal lesions. No hydroureteronephrosis. The bladder contains a Foley catheter. Moderate bladder wall thickening and high attenuation material could suggest infection or blood. Stomach/Bowel: The stomach is grossly normal. Moderate-sized hiatal hernia. The duodenum is also normal. Mildly dilated small bowel loops with air-fluid levels but without obvious transition point. The distal and terminal ileum are also slightly dilated. Findings more likely diffuse ileus than small-bowel obstruction. Definite improvement since the prior examination. 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 No complicating features are demonstrated. Vascular/Lymphatic: Stable aortic and branch vessel calcifications but no aneurysm. The major venous structures are patent. Stable  scattered mesenteric and retroperitoneal lymph nodes but no mass or overt adenopathy. Reproductive: Grossly normal and stable. Other: Small scattered areas of free fluid in the abdomen without obvious rim enhancement to suggest abscess or peritonitis. Diffuse body wall edema suggesting anasarca. Musculoskeletal: No significant bony findings. IMPRESSION: 1. Persistent bilateral pleural effusions with overlying atelectasis. 2. 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 No complicating features. 3. Improved appearance of the small bowel. There is persistent mild small bowel dilatation with air-fluid levels but no obvious discrete small-bowel obstruction. This could be a postoperative ileus. Recommend correlation with bowel sounds. 4. Small scattered abdominal fluid collections but no rim enhancing collections to suggest abscess or peritonitis. 5. Stable large calcified splenic mass. 6. Diffuse body wall edema suggesting anasarca. 7. Moderate-sized hiatal hernia. Electronically Signed   By: Marijo Sanes M.D.   On: 01/11/2018 11:31    TELEMETRY: Atrial fibrillation with RVR  Echo: Systolic function mild-moderately reduced with EF of 40 to 45%. Moderate MR, TR,  biatrial dilation, with appendage moderately to severely dilated. Trivial pericardial effusion noted. Left pleural effusion.    ASSESSMENT AND PLAN:  Active Problems:   Benign essential HTN   Atrial fibrillation, chronic (HCC)   Chemical diabetes   Borderline diabetes mellitus   Diverticulitis of colon with perforation   Atrial fibrillation with RVR ([place])    1. Atrial fibrillation with RVR 107  - Rate better controlled   - Continue with  Amiodarone IV infusion, digoxin .125mg  IV daily, metoprolol 5mg  q 6 hours.  Will attempt converting to po meds when rate improved.   - Continue monitoring blood pressure    - Continue Eliquis 5mg  twice daily  2. Acute hypoxic respiratory failure   - Wean oxygen as tolerated  3. Anasarca   -  Continue with IV Lasix  4.  Perforated diverticulitis   - Continue management per surgery   Patient with very poor prognosis with prolonged hospital stay. Consult to palliative care was made per hospitalist.   The history, physical exam findings, and plan of action were all discussed with Dr. Bartholome Bill in all decision-making was made in collaboration   Avie Arenas  PA-C 01/12/2018 8:44 AM

## 2018-01-13 DIAGNOSIS — R109 Unspecified abdominal pain: Secondary | ICD-10-CM

## 2018-01-13 DIAGNOSIS — Z515 Encounter for palliative care: Secondary | ICD-10-CM

## 2018-01-13 DIAGNOSIS — Z7189 Other specified counseling: Secondary | ICD-10-CM

## 2018-01-13 DIAGNOSIS — R410 Disorientation, unspecified: Secondary | ICD-10-CM

## 2018-01-13 LAB — GLUCOSE, CAPILLARY
GLUCOSE-CAPILLARY: 150 mg/dL — AB (ref 70–99)
Glucose-Capillary: 128 mg/dL — ABNORMAL HIGH (ref 70–99)
Glucose-Capillary: 137 mg/dL — ABNORMAL HIGH (ref 70–99)
Glucose-Capillary: 153 mg/dL — ABNORMAL HIGH (ref 70–99)
Glucose-Capillary: 184 mg/dL — ABNORMAL HIGH (ref 70–99)
Glucose-Capillary: 99 mg/dL (ref 70–99)

## 2018-01-13 LAB — BASIC METABOLIC PANEL
Anion gap: 5 (ref 5–15)
BUN: 23 mg/dL (ref 8–23)
CALCIUM: 7.6 mg/dL — AB (ref 8.9–10.3)
CO2: 35 mmol/L — AB (ref 22–32)
Chloride: 94 mmol/L — ABNORMAL LOW (ref 98–111)
Creatinine, Ser: 0.57 mg/dL (ref 0.44–1.00)
GFR calc Af Amer: >60 mL/min (ref >60)
GFR calc non Af Amer: >60 mL/min (ref >60)
GLUCOSE: 146 mg/dL — AB (ref 70–99)
Potassium: 3.4 mmol/L — ABNORMAL LOW (ref 3.5–5.1)
Sodium: 134 mmol/L — ABNORMAL LOW (ref 135–145)

## 2018-01-13 LAB — CBC
HEMATOCRIT: 29.4 % — AB (ref 35.0–47.0)
Hemoglobin: 9.9 g/dL — ABNORMAL LOW (ref 12.0–16.0)
MCH: 27.8 pg (ref 26.0–34.0)
MCHC: 33.8 g/dL (ref 32.0–36.0)
MCV: 82.3 fL (ref 80.0–100.0)
Platelets: 417 10*3/uL (ref 150–440)
RBC: 3.57 MIL/uL — ABNORMAL LOW (ref 3.80–5.20)
RDW: 16.4 % — ABNORMAL HIGH (ref 11.5–14.5)
WBC: 11.8 10*3/uL — AB (ref 3.6–11.0)

## 2018-01-13 LAB — PHOSPHORUS: Phosphorus: 3.4 mg/dL (ref 2.5–4.6)

## 2018-01-13 MED ORDER — LORAZEPAM 2 MG/ML IJ SOLN
0.5000 mg | Freq: Four times a day (QID) | INTRAMUSCULAR | Status: DC | PRN
  Administered 2018-01-13 – 2018-01-14 (×2): 0.5 mg via INTRAVENOUS
  Filled 2018-01-13 (×2): qty 1

## 2018-01-13 MED ORDER — POTASSIUM CHLORIDE 10 MEQ/100ML IV SOLN
10.0000 meq | INTRAVENOUS | Status: AC
  Administered 2018-01-13 (×4): 10 meq via INTRAVENOUS
  Filled 2018-01-13 (×4): qty 100

## 2018-01-13 MED ORDER — FAT EMULSION PLANT BASED 20 % IV EMUL
250.0000 mL | INTRAVENOUS | Status: AC
  Administered 2018-01-13: 250 mL via INTRAVENOUS
  Filled 2018-01-13: qty 250

## 2018-01-13 MED ORDER — TRACE MINERALS CR-CU-MN-SE-ZN 10-1000-500-60 MCG/ML IV SOLN
INTRAVENOUS | Status: AC
  Administered 2018-01-13: 17:00:00 via INTRAVENOUS
  Filled 2018-01-13: qty 1680

## 2018-01-13 NOTE — Progress Notes (Addendum)
Husband called me in the room and reported that pt  anxious and a bit restless. On assessment pt can't get comfortable in the bed after repositioning and even after giving pain med's. Notify prime. Awaiting callback. Will continue to monitor.  Update 2309: Doctor Jannifer Franklin ordered lorazepam IV 0.5 mg every 6 hours PRN. Will continue to monitor.  Update 0616: Pt potassium is at 3.3. Notify prime. Will continue to monitor.  Update 0623: Doctor Marcille Blanco ordered  Potassium 79mEq in 100 ml IVPB. Will continue to monitor.  Update 0650: Pt ostomy from the skin was leaking. Pt ostomy was changed. Will continue to monitor.

## 2018-01-13 NOTE — Clinical Social Work Note (Signed)
CSW continuing to follow patient's progress throughout discharge planning.  PT is recommending SNF, patient has been faxed out, awaiting updated PT notes.  Jones Broom. [place], MSW, [facility]  01/13/2018 5:51 PM

## 2018-01-13 NOTE — Progress Notes (Signed)
PHARMACY - ADULT TOTAL PARENTERAL NUTRITION CONSULT NOTE   Pharmacy Consult for TPN Management  Indication: colon perforation, NPO Status   Patient Measurements: Height: 4\' 11"  (149.9 cm) Weight: 183 lb 13.8 oz (83.4 kg) IBW/kg (Calculated) : 43.2 TPN AdjBW (KG): 50.5 Body mass index is 37.14 kg/m.   Assessment:  Pharmacy consulted for TPN management for 77 yo female with diffuse inflammation on distal jejunum extending to sigmoid colon with perforation.   TPN Access: PICC Triple Lumen  TPN start date: 12/30/17 Nutritional Goals (per RD recommendation on 7/26): KCal: 1500-1700 kcal  Protein: 87-102 grams  Fluid: > 1.5 L Goal TPN rate is 83 ml/hr  Current Nutrition:   Plan:  K 3.4 this morning after diuresus. Will give potassium chloride 10 mEq IV Q2H x 4 doses and recheck BMP with AM labs. TPN labs to continue as scheduled.  Continue reduced TPN rate due to volume overload -  Clinimix E 5/15 at 70 mL/hr. ILE 20% at 30mL/hr.  This TPN will supply approximately 84 g AA, 252 g dextrose, 48 g lipid which is approximately 1672 kcal.   Continue MVI, trace elements to TPN  SSI moderate Q4hr.  Glucose control is better since stopping steroids  Monitor TPN labs. Blood Glucose. Will recheck electrolytes per protocol.  Patient is now being diuresed with furosemide 40 mg IV BID due to volume overload. TPN rate was also reduced on 01/11/18.   Palliative care consult was placed 01/12/18 by hospitalist.   Pharmacy will continue to monitor and adjust per consult.   Laural Benes, Pharm.D., BCPS Clinical Pharmacist 01/13/2018 7:59 AM

## 2018-01-13 NOTE — Progress Notes (Signed)
[place] [facility] [place] NAME: Jody [name]    MR#:  [id number]  DATE OF BIRTH:  [birth year]  SUBJECTIVE:   Mental status remains the same.  Husband is at bedside.  REVIEW OF SYSTEMS:    Unable to obtain Tolerating Diet:  TPN       DRUG ALLERGIES:   Allergies  Allergen Reactions  . Poison Oak Extract Other (See Comments)    blistering  . Ampicillin Rash  . Penicillin V Potassium Rash    Has patient had a PCN reaction causing immediate rash, facial/tongue/throat swelling, SOB or lightheadedness with hypotension: No Has patient had a PCN reaction causing severe rash involving mucus membranes or skin necrosis: No Has patient had a PCN reaction that required hospitalization: No Has patient had a PCN reaction occurring within the last 10 years: No If all of the above answers are "NO", then may proceed with Cephalosporin use.     VITALS:  Blood pressure 136/79, pulse (!) 108, temperature (!) 96.9 F (36.1 C), temperature source Axillary, resp. rate 20, height 4\' 11"  (1.499 m), weight 83.4 kg, SpO2 97 %.  PHYSICAL EXAMINATION:  Constitutional: Appears beats with anasarca hENT: Normocephalic. Marland Kitchen Oropharynx is clear and moist.  Eyes: Conjunctivae and EOM are normal. PERRLA, no scleral icterus.  Neck: Normal ROM. Neck supple. No JVD. No tracheal deviation. CVS:irr, irr tachycardic S1/S2 +, no murmurs, no gallops, no carotid bruit.  Pulmonary: Effort and breath sounds normal, no stridor, rhonchi, wheezes, rales.  Abdominal: Incision is clean and intact.  Staples have been removed no rebound or guarding.  Patient has ostomy  musculoskeletal: Normal range of motion.  2+ edema and no tenderness.  Neuro:  confused . CN 2-12 grossly intact. No focal deficits. She has generalized weakness Skin: Skin is warm and dry. No rash noted. Psychiatric: Slow affect with  confusion    LABORATORY PANEL:   CBC Recent Labs  Lab 01/13/18 0510  WBC 11.8*   HGB 9.9*  HCT 29.4*  PLT 417   ------------------------------------------------------------------------------------------------------------------  Chemistries  Recent Labs  Lab 01/12/18 0421 01/13/18 0510  NA 136 134*  K 3.9 3.4*  CL 95* 94*  CO2 35* 35*  GLUCOSE 152* 146*  BUN 25* 23  CREATININE 0.48 0.57  CALCIUM 7.3* 7.6*  MG 1.9  --   AST 19  --   ALT 22  --   ALKPHOS 112  --   BILITOT 0.3  --    ------------------------------------------------------------------------------------------------------------------  Cardiac Enzymes No results for input(s): TROPONINI in the last 168 hours. ------------------------------------------------------------------------------------------------------------------  RADIOLOGY:  Ct Head Wo Contrast  Result Date: 01/11/2018 CLINICAL DATA:  Confusion for 1 week.  History of lung cancer. EXAM: CT HEAD WITHOUT CONTRAST TECHNIQUE: Contiguous axial images were obtained from the base of the skull through the vertex without intravenous contrast. COMPARISON:  CT HEAD report dated July 11, 1998 though images are not available for direct comparison. FINDINGS: BRAIN: No intraparenchymal hemorrhage, mass effect nor midline shift. The ventricles and sulci are normal for age. Patchy to confluent supratentorial white matter hypodensities most conspicuous within LEFT internal capsule. No acute large vascular territory infarcts. No abnormal extra-axial fluid collections. Basal cisterns are patent. Prominent posterior fossa extra-axial space, potential arachnoid cyst. VASCULAR: Moderate calcific atherosclerosis of the carotid siphons. SKULL: No skull fracture. Prominent occipital calvarium arachnoid granulations, normal. No significant scalp soft tissue swelling. SINUSES/ORBITS: Trace paranasal sinus mucosal thickening. Mastoid air cells are well aerated.The included ocular globes  and orbital contents are non-suspicious. OTHER: None. IMPRESSION: 1. No acute  intracranial process. 2. Moderate to severe chronic small vessel ischemic changes. Electronically Signed   By: Elon Alas M.D.   On: 01/11/2018 15:59     ASSESSMENT AND PLAN:    77 year old female with a history of atrial fibrillation chronic who is postoperative exploratory laparotomy with abdominal washout for perforated diverticulitis.   1.  Acute metabolic encephalopathy due to morphine and Ativan along with prolonged intubation and prolonged hospitalization: Patient is slowly improving. I have stopped morphine and Ativan Continue to monitor mental status CT head 2 days ago without acute pathology. Discussed with patient's husband that this will be a slow process  2.  Acute hypoxic respiratory failure with prolonged postoperative intubation and acute systolic and diastolic heart failure ejection fraction 40 to 45% by echocardiogram Continue IV Lasix Monitor intake and output Patient was extubated August 3. Wean O2 as tolerated.  3.  A. fib with RVR: Continue amiodarone drip Continue digoxin and metoprolol [facility] cardiology consultation appreciated Continue lovenox per pharmacy consult  4.  Nutrition: Patient is currently on TPN and dysphasia 1 diet  5.  Anasarca from prolonged hospitalization/bedridden/acute heart failure: Continue IV Lasix    6.  Perforated diverticulitis/ileus: Management as per surgery  Patient with overall very poor prognosis.  She has had prolonged hospitalization and prolonged intubation. Discussed with family at bedside. Palliative care consult appreciated     Management plans discussed with the patient and family and they are in agreement.  CODE STATUS: DNR  TOTAL TIME TAKING CARE OF THIS PATIENT: 23 minutes.     POSSIBLE D/C ??, DEPENDING ON CLINICAL CONDITION.   Chakira Jachim M.D on 01/13/2018 at 11:40 AM  Between 7am to 6pm - Pager - [phone] After 6pm go to [email or web address] - password [email or web address] [facility]   Office  [phone]  CC: Primary care physician; Baxter Hire, MD  Note: This dictation was prepared with Dragon dictation along with smaller phrase technology. Any transcriptional errors that result from this process are unintentional.

## 2018-01-13 NOTE — Plan of Care (Signed)
  Problem: Clinical Measurements: Goal: Will remain free from infection Outcome: Progressing   Problem: Pain Managment: Goal: General experience of comfort will improve Outcome: Progressing   Problem: Safety: Goal: Ability to remain free from injury will improve Outcome: Progressing   

## 2018-01-13 NOTE — Progress Notes (Signed)
Subjective:    Jody [name] is a 77 y.o. female  Hospital stay day 17, 8 Days Post-Op Exlap, abdominal washout, hartmans.  No acute issues overnight.  Still confused but able to get some sleep per husband   Objective:      Temp:  [97.8 F (36.6 C)-98.7 F (37.1 C)] 97.8 F (36.6 C) (08/09 0537) Pulse Rate:  [102-118] 118 (08/09 0346) Resp:  [17-18] 17 (08/09 0346) BP: (101-144)/(59-86) 125/70 (08/09 0537) SpO2:  [94 %-98 %] 96 % (08/09 0537) Weight:  [83.4 kg] 83.4 kg (08/09 0346)     Height: 4\' 11"  (149.9 cm) Weight: 83.4 kg BMI (Calculated): 37.12   Intake/Output this shift:  No intake/output data recorded.       Constitutional :  alert, cooperative, appears stated age and no distress  Gastrointestinal: soft, non-tender; bowel sounds normal; no masses,  no organomegaly.   Skin: Cool and moist. Incision c/d/i. Ostomy patent with liquid stool in bag.   Psychiatric: Normal affect, non-agitated, still slightly confused       LABS:  CMP Latest Ref Rng & Units 01/13/2018 01/12/2018 01/11/2018  Glucose 70 - 99 mg/dL 146(H) 152(H) 151(H)  BUN 8 - 23 mg/dL 23 25(H) 31(H)  Creatinine 0.44 - 1.00 mg/dL 0.57 0.48 0.59  Sodium 135 - 145 mmol/L 134(L) 136 136  Potassium 3.5 - 5.1 mmol/L 3.4(L) 3.9 4.1  Chloride 98 - 111 mmol/L 94(L) 95(L) 96(L)  CO2 22 - 32 mmol/L 35(H) 35(H) 34(H)  Calcium 8.9 - 10.3 mg/dL 7.6(L) 7.3(L) 7.4(L)  Total Protein 6.5 - 8.1 g/dL - 4.6(L) -  Total Bilirubin 0.3 - 1.2 mg/dL - 0.3 -  Alkaline Phos 38 - 126 U/L - 112 -  AST 15 - 41 U/L - 19 -  ALT 0 - 44 U/L - 22 -   CBC Latest Ref Rng & Units 01/13/2018 01/12/2018 01/11/2018  WBC 3.6 - 11.0 K/uL 11.8(H) 13.2(H) 14.0(H)  Hemoglobin 12.0 - 16.0 g/dL 9.9(L) 9.5(L) 9.8(L)  Hematocrit 35.0 - 47.0 % 29.4(L) 27.9(L) 28.5(L)  Platelets 150 - 440 K/uL 417 385 386    RADS: n/a Assessment:   Exlap, abdominal washout, hartmans.  Return of bowel function noted.  Leukocytosis continuing to improve.  She is able to  speak full sentences now with some encouragement, which is an improvement in her mentation per husband and my own personal assessment.  Today was the first day in a while without morphine or ativan at night.  Will f/u on primary team recs re: encephalopathy and oral intake.  RN this am reported speech will be by today to reval.  If she is persistently unable to take in food, may need to consider feeding tube(surgery can offer PEG vs Gtube).

## 2018-01-13 NOTE — Progress Notes (Signed)
Daily Progress Note   Patient Name: Jody [name]       Date: 01/13/2018 DOB: 1941/01/14  Age: 77 y.o. MRN#: [id number] Attending Physician: Bettey Costa, MD Primary Care Physician: Baxter Hire, MD Admit Date: 12/27/2017  Reason for Consultation/Follow-up: Establishing goals of care  Subjective: Patient is resting in bed sleeping. She awakens briefly to touch and looks at Probation officer, she quickly closes her eyes again. Nonverbal. Husband at bedside. He states he has been told she is doing well. He states he has not noticed improvement in her status.   He states a few days prior to admission, they had canned 50 quarts of tomatoes, and she had been doing fine. She used no assistive devices and had no cognitive or functional deficits. [place] and have been married 55 years. They have 1 son. They are both active in church. They do everything together.   He states he has been worried the last few days about how his wife is doing. He states they both have a living will. She would never want to be kept alive by artificial means or in a vegetative state. He is okay with the current TPN as it is temorary. She would never want a feeding tube or dialysis. He will bring the living will for scanning. He is hopeful she will improve, but is realistic she may not.     Length of Stay: 17  Current Medications: Scheduled Meds:  . chlorhexidine  15 mL Mouth Rinse BID  . digoxin  0.125 mg Intravenous Daily  . enoxaparin (LOVENOX) injection  1 mg/kg Subcutaneous Q12H  . feeding supplement (NEPRO CARB STEADY)  237 mL Oral BID BM  . furosemide  40 mg Intravenous BID  . insulin aspart  0-15 Units Subcutaneous Q4H  . mouth rinse  15 mL Mouth Rinse q12n4p  . metoprolol tartrate  5 mg Intravenous Q6H  .  sodium chloride flush  10-40 mL Intracatheter Q12H    Continuous Infusions: . Marland KitchenTPN (CLINIMIX-E) Adult 70 mL/hr at 01/12/18 1828  . Marland KitchenTPN (CLINIMIX-E) Adult    . amiodarone 30 mg/hr (01/13/18 1040)  . Fat emulsion    . potassium chloride Stopped (01/13/18 1135)  . sodium chloride      PRN Meds: acetaminophen, levalbuterol, metoprolol tartrate, ondansetron (ZOFRAN) IV, promethazine, sodium  chloride flush, sodium chloride flush  Physical Exam  Constitutional: No distress.  Pulmonary/Chest: Effort normal.  Skin: Skin is warm and dry.            Vital Signs: BP 136/79 (BP Location: Left Arm)   Pulse (!) 108   Temp (!) 96.9 F (36.1 C) (Axillary)   Resp 20   Ht 4\' 11"  (1.499 m)   Wt 83.4 kg   SpO2 97%   BMI 37.14 kg/m  SpO2: SpO2: 97 % O2 Device: O2 Device: Nasal Cannula O2 Flow Rate: O2 Flow Rate (L/min): 2 L/min  Intake/output summary:   Intake/Output Summary (Last 24 hours) at 01/13/2018 1309 Last data filed at 01/13/2018 1033 Gross per 24 hour  Intake 2253.72 ml  Output 5700 ml  Net -3446.28 ml   LBM: Last BM Date: 01/13/18 Baseline Weight: Weight: 72.6 kg Most recent weight: Weight: 83.4 kg       Palliative Assessment/Data: 20%    Flowsheet Rows     Most Recent Value  Intake Tab  Referral Department  Hospitalist  Unit at Time of Referral  Cardiac/Telemetry Unit  Palliative Care Primary Diagnosis  Other (Comment)  Date Notified  01/12/18  Palliative Care Type  New Palliative care  Reason for referral  Clarify Goals of Care  Date of Admission  12/27/17  Date first seen by Palliative Care  01/12/18  # of days Palliative referral response time  0 Day(s)  # of days IP prior to Palliative referral  16  Clinical Assessment  Psychosocial & Spiritual Assessment  Palliative Care Outcomes      Patient Active Problem List   Diagnosis Date Noted  . Acute delirium   . Goals of care, counseling/discussion   . Palliative care encounter   . Diverticulitis of  colon with perforation 12/27/2017  . Atrial fibrillation with RVR ([place]) 12/27/2017  . Malignant neoplasm of upper lobe of right lung ([place]) 12/31/2015  . 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 TI (tricuspid incompetence) 01/21/2015  . Anxiety 01/13/2015  . Clinical depression 01/13/2015  . 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 Borderline diabetes mellitus 01/13/2015  . Benign essential HTN 01/06/2015  . Essential (primary) hypertension 01/06/2015  . Atrial fibrillation, chronic ([place]) 05/20/2014  . 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 Chronic atrial fibrillation ([place]) 05/20/2014  . 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 Cough 11/20/2013  . Breath shortness 11/01/2013  . Breathlessness on exertion 10/30/2013    Palliative Care Assessment & Plan    Recommendations/Plan:  Continue current care.   Recommend Risperdal beginning at 0.5mg  daily for delirium if normal QTC.     Code Status:    Code Status Orders  (From admission, onward)         Start     Ordered   12/27/17 1602  Do not attempt resuscitation (DNR)  Continuous    Question Answer Comment  In the event of cardiac or respiratory ARREST Do not call a "code blue"   In the event of cardiac or respiratory ARREST Do not perform Intubation, CPR, defibrillation or ACLS   In the event of cardiac or respiratory ARREST Use medication by any route, position, wound care, and other measures to relive pain and suffering. May use oxygen, suction and manual treatment of airway obstruction as needed for comfort.      12/27/17 1601        Code Status History    Date Active Date Inactive Code Status Order ID Comments User Context  12/27/2017 1204 12/27/2017 1601 Full Code [id number]  Benjamine Sprague, DO ED   12/27/2017 1204 12/27/2017 1204 Full Code [id number]  Benjamine Sprague, DO ED    Advance Directive Documentation     Most Recent Value  Type of Advance Directive  Living will  Pre-existing out of facility DNR order  (yellow form or pink MOST form)  -  "MOST" Form in Place?  -       Prognosis:   Poor.   Discharge Planning:  To Be Determined   Thank you for allowing the Palliative Medicine Team to assist in the care of this patient.   Time In: 12:00 Time Out: 1:15 Total Time 1 hr 15 min Prolonged Time Billed  no      Greater than 50%  of this time was spent counseling and coordinating care related to the above assessment and plan.  Asencion Gowda, NP  Please contact Palliative Medicine Team phone at [phone] for questions and concerns.

## 2018-01-13 NOTE — Consult Note (Addendum)
Jody [name] Nurse ostomy consult note Patient and her spouse in [facility] room 257 Stoma type/location: LUQ colostomy, producing pudding consistency, brown stool. Stomal assessment/size: Site and stoma assessment deferred.  No ostomy supplies observed in room Apparently, the patient underwent ostomy surgery 18 days ago.  The [place] consult arrived today.  I understand the patient will be transferred to a long term care facility at the time of discharge.  I have requested the following items be ordered and placed at the bedside:  [place] 234 and 644.  She currently has a two piece, filtered, 1 3/4 Holl[place] fecal pouch in place and is not leaking. Val Riles, RN, MSN, CWOCN, CNS-BC, pager [phone]

## 2018-01-13 NOTE — Progress Notes (Signed)
Slightly better day. More awake but napped often. Husband upset -he just realized patient will likely not go home. Also ambivalent about whether to place a feeding tube. Palliative care in today. Husband also talked to Dr. Benjie Karvonen twice about patients poor prognosis.

## 2018-01-13 NOTE — Care Management Important Message (Signed)
Copy of signed IM left with patient in room.  

## 2018-01-13 NOTE — Progress Notes (Addendum)
Nutrition Follow-up  DOCUMENTATION CODES:   Not applicable  INTERVENTION:   Continue Clinimix E 5/15 at 70 mL/hr + 20% ILE at 20 mL/hr over 12 hours until pt able to meet 60% of her estimated needs via oral intake.    Provides 1673 kcal, 84 grams of protein, 1920 mL fluids daily  Continue adult MVI and trace elements as daily TPN additives.  Continue measuring daily weights.  Nepro Shake po BID, each supplement provides 425 kcal and 19 grams protein  NUTRITION DIAGNOSIS:   Inadequate oral intake related to acute illness(colon perf) as evidenced by per patient/family report, NPO status.  Ongoing inadequate intake - addressing with TPN  GOAL:   Patient will meet greater than or equal to 90% of their needs  Met with TPN.  MONITOR:   PO intake, Supplement acceptance, Labs, Weight trends, Skin, I & O's, Other (Comment)(TPN)  ASSESSMENT:   Patient with PMH of A-fib, Lung Cancer, Asthma, HTN, and borderline DM presents with abdominal pain. On CT she was noted to have diffuse inflammation of the distal jejenum extending to the descending/sigmoid colon with perforation which may be secondary to diverticulitis.    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 -Patient was extubated on 8/3.  Pt continues to have poor appetite and oral intake. Pt continues to be on TPN at goal rate. Rate decreased to 20m/hr on 8/7 r/t volume overload. Pt also being diuresed. Pt's weight now down 22lbs since 8/5; pt still ~24lbs above her admit weight. Recommend continue TPN until pt is able to meet at least 60% of her estimated needs via oral intake. Palliative care following. Per MD note, pt may need possible PEG tube placement if she continues to have poor oral intake and family wishes to continue full aggressive treatment.  Medications reviewed and include: Lovenox,  lasix, insulin, pepcid, KCl  Labs reviewed: Na 134(L), K 3.4(L), Cl 94(L), Ca 7.6(L), P 3.4 wnl, Mg 1.9 wnl Triglycerides 123- 8/5  wbc- 11.8(H), Hgb 9.9(L), 29.4(L) cbgs- 153, 99, 184 x 24 hrs  Diet Order:   Diet Order            Diet NPO time specified  Diet effective now              EDUCATION NEEDS:   Not appropriate for education at this time  Skin: Reviewed RN Assessment(incision abdomen )  Last BM:  8/8- type 7 2016m Height:   Ht Readings from Last 1 Encounters:  12/27/17 4' 11"  (1.499 m)    Weight:   Wt Readings from Last 1 Encounters:  01/13/18 83.4 kg    Ideal Body Weight:  44.69 kg  BMI:  Body mass index is 37.14 kg/m.  Estimated Nutritional Needs:   Kcal:  1815-2180 (25-30 kcal/kg)  Protein:  87-102 grams (1.2-1.4g/kg)  Fluid:  per MD  CaKoleen DistanceS, RD, LDN Pager #- [phone]#- [phone] Hours Pager: [phone]

## 2018-01-13 NOTE — Progress Notes (Signed)
[facility] Cardiology  SUBJECTIVE: Jody [name] is a 77 year old female who presented to the ED on 12/27/17 for abdominal pain, nausea, and constipation. Found to have diverticulitis with perforation. Patient was noted to be in atrial fibrillation with RVR in the ED and was put on Cardizem drip.   Today, patient is awake, appears more alert. Denies any chest pain or abdominal pain. Continued to ask where she is and why she is here. The remainder of the history is limited as patient unable to answer any questions. Husband at bedside concerned that she hasn't been eating.   Vitals:   01/12/18 1953 01/13/18 0028 01/13/18 0346 01/13/18 0537  BP: 127/71 (!) 119/59 101/61 125/70  Pulse: (!) 112 (!) 102 (!) 118   Resp: 17  17   Temp: 98.7 F (37.1 C)  97.8 F (36.6 C) 97.8 F (36.6 C)  TempSrc: Oral  Oral Oral  SpO2: 98% 96% 94% 96%  Weight:   83.4 kg   Height:         Intake/Output Summary (Last 24 hours) at 01/13/2018 0830 Last data filed at 01/13/2018 0654 Gross per 24 hour  Intake 2253.72 ml  Output 4500 ml  Net -2246.28 ml      PHYSICAL EXAM  General: Confused, but appears more alert  HEENT:  Normocephalic and atramatic Neck:  No JVD.  Lungs: Clear bilaterally to auscultation and percussion. Heart: Irregular rate, irregular rhythm. No murmurs, gallops or rubs.  Abdomen: Bowel sounds are positive, abdomen soft and mildly tender  Msk:  Back normal. Normal strength and tone for age. Gait not assessed.  Extremities: No clubbing, cyanosis or edema.   Neuro: Awake, but  mumbles most responses. Unsure where she is or why she is here Psych:  Awake, but mumbles most responses    LABS: Basic Metabolic Panel: Recent Labs    01/11/18 0536 01/12/18 0421 01/13/18 0510  NA 136 136 134*  K 4.1 3.9 3.4*  CL 96* 95* 94*  CO2 34* 35* 35*  GLUCOSE 151* 152* 146*  BUN 31* 25* 23  CREATININE 0.59 0.48 0.57  CALCIUM 7.4* 7.3* 7.6*  MG 2.1 1.9  --   PHOS 3.3 3.3 3.4   Liver Function  Tests: Recent Labs    01/12/18 0421  AST 19  ALT 22  ALKPHOS 112  BILITOT 0.3  PROT 4.6*  ALBUMIN 2.0*   No results for input(s): LIPASE, AMYLASE in the last 72 hours. CBC: Recent Labs    01/11/18 0536 01/12/18 0421 01/13/18 0510  WBC 14.0* 13.2* 11.8*  NEUTROABS 12.5* 11.9*  --   HGB 9.8* 9.5* 9.9*  HCT 28.5* 27.9* 29.4*  MCV 82.6 81.9 82.3  PLT 386 385 417   Cardiac Enzymes: No results for input(s): CKTOTAL, CKMB, CKMBINDEX, TROPONINI in the last 72 hours. BNP: Invalid input(s): POCBNP D-Dimer: No results for input(s): DDIMER in the last 72 hours. Hemoglobin A1C: No results for input(s): HGBA1C in the last 72 hours. Fasting Lipid Panel: No results for input(s): CHOL, HDL, LDLCALC, TRIG, CHOLHDL, LDLDIRECT in the last 72 hours. Thyroid Function Tests: No results for input(s): TSH, T4TOTAL, T3FREE, THYROIDAB in the last 72 hours.  Invalid input(s): FREET3 Anemia Panel: No results for input(s): VITAMINB12, FOLATE, FERRITIN, TIBC, IRON, RETICCTPCT in the last 72 hours.  Ct Head Wo Contrast  Result Date: 01/11/2018 CLINICAL DATA:  Confusion for 1 week.  History of lung cancer. EXAM: CT HEAD WITHOUT CONTRAST TECHNIQUE: Contiguous axial images were obtained from the base  of the skull through the vertex without intravenous contrast. COMPARISON:  CT HEAD report dated July 11, 1998 though images are not available for direct comparison. FINDINGS: BRAIN: No intraparenchymal hemorrhage, mass effect nor midline shift. The ventricles and sulci are normal for age. Patchy to confluent supratentorial white matter hypodensities most conspicuous within LEFT internal capsule. No acute large vascular territory infarcts. No abnormal extra-axial fluid collections. Basal cisterns are patent. Prominent posterior fossa extra-axial space, potential arachnoid cyst. VASCULAR: Moderate calcific atherosclerosis of the carotid siphons. SKULL: No skull fracture. Prominent occipital calvarium arachnoid  granulations, normal. No significant scalp soft tissue swelling. SINUSES/ORBITS: Trace paranasal sinus mucosal thickening. Mastoid air cells are well aerated.The included ocular globes and orbital contents are non-suspicious. OTHER: None. IMPRESSION: 1. No acute intracranial process. 2. Moderate to severe chronic small vessel ischemic changes. Electronically Signed   By: Elon Alas M.D.   On: 01/11/2018 15:59   Ct Abdomen Pelvis W Contrast  Result Date: 01/11/2018 CLINICAL DATA:  Peritonitis.  Weakness. EXAM: CT ABDOMEN AND PELVIS WITH CONTRAST TECHNIQUE: Multidetector CT imaging of the abdomen and pelvis was performed using the standard protocol following bolus administration of intravenous contrast. CONTRAST:  ISOVUE-300 IOPAMIDOL (ISOVUE-300) INJECTION 61%, 166mL OMNIPAQUE IOHEXOL 300 MG/ML SOLN COMPARISON:  Multiple prior recent CT scans. The most recent is 01/04/2018 FINDINGS: Lower chest: Persistent bilateral pleural effusions, right greater than left with overlying atelectasis. The heart is mildly enlarged but stable. No pericardial effusion. Stable moderate to large hiatal hernia. Hepatobiliary: No focal hepatic lesions or intrahepatic biliary dilatation. The gallbladder is surgically absent. No common bile duct dilatation. Pancreas: No mass, inflammation or ductal dilatation. Spleen: Stable large partially calcified mass involving the spleen. Adrenals/Urinary Tract: The adrenal glands are unremarkable and stable. No renal lesions. No hydroureteronephrosis. The bladder contains a Foley catheter. Moderate bladder wall thickening and high attenuation material could suggest infection or blood. Stomach/Bowel: The stomach is grossly normal. Moderate-sized hiatal hernia. The duodenum is also normal. Mildly dilated small bowel loops with air-fluid levels but without obvious transition point. The distal and terminal ileum are also slightly dilated. Findings more likely diffuse ileus than small-bowel  obstruction. Definite improvement since the prior examination. 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 No complicating features are demonstrated. Vascular/Lymphatic: Stable aortic and branch vessel calcifications but no aneurysm. The major venous structures are patent. Stable scattered mesenteric and retroperitoneal lymph nodes but no mass or overt adenopathy. Reproductive: Grossly normal and stable. Other: Small scattered areas of free fluid in the abdomen without obvious rim enhancement to suggest abscess or peritonitis. Diffuse body wall edema suggesting anasarca. Musculoskeletal: No significant bony findings. IMPRESSION: 1. Persistent bilateral pleural effusions with overlying atelectasis. 2. 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 No complicating features. 3. Improved appearance of the small bowel. There is persistent mild small bowel dilatation with air-fluid levels but no obvious discrete small-bowel obstruction. This could be a postoperative ileus. Recommend correlation with bowel sounds. 4. Small scattered abdominal fluid collections but no rim enhancing collections to suggest abscess or peritonitis. 5. Stable large calcified splenic mass. 6. Diffuse body wall edema suggesting anasarca. 7. Moderate-sized hiatal hernia. Electronically Signed   By: Marijo Sanes M.D.   On: 01/11/2018 11:31    TELEMETRY: Atrial fibrillation with RVR  Echo: Systolic function mild-moderately reduced with EF of 40 to 45%. Moderate MR, TR,  biatrial dilation, with appendage moderately to severely dilated. Trivial pericardial effusion noted. Left pleural effusion.    ASSESSMENT  AND PLAN:  Active Problems:   Benign essential HTN   Atrial fibrillation, chronic (HCC)   Chemical diabetes   Borderline diabetes mellitus   Diverticulitis of colon with perforation   Atrial fibrillation with RVR (HCC)   Acute delirium   Goals of care, counseling/discussion   Palliative care  encounter    1. Atrial fibrillation with RVR 111  - Rate better controlled   - Continue with Amiodarone IV infusion, digoxin .125mg  IV daily, metoprolol 5mg  q 6 hours.  Will attempt converting to po meds when rate improved.   - Continue monitoring blood pressure    - Continue Eliquis 5mg  twice daily  2. Acute hypoxic respiratory failure   - Wean oxygen as tolerated  3. Anasarca   - Continue with IV Lasix  4.  Perforated diverticulitis   - Continue management per surgery   - CBC improving   Patient with very poor prognosis with prolonged hospital stay. Consult to palliative care was made per hospitalist.   Signing off for now.   The history, physical exam findings, and plan of action were all discussed with Dr. Bartholome Bill in all decision-making was made in collaboration   Avie Arenas  PA-C 01/13/2018 8:30 AM

## 2018-01-14 LAB — BASIC METABOLIC PANEL
ANION GAP: 8 (ref 5–15)
BUN: 23 mg/dL (ref 8–23)
CALCIUM: 7.7 mg/dL — AB (ref 8.9–10.3)
CHLORIDE: 95 mmol/L — AB (ref 98–111)
CO2: 34 mmol/L — AB (ref 22–32)
Creatinine, Ser: 0.54 mg/dL (ref 0.44–1.00)
GFR calc Af Amer: >60 mL/min (ref >60)
GFR calc non Af Amer: >60 mL/min (ref >60)
GLUCOSE: 124 mg/dL — AB (ref 70–99)
Potassium: 3.3 mmol/L — ABNORMAL LOW (ref 3.5–5.1)
Sodium: 137 mmol/L (ref 135–145)

## 2018-01-14 LAB — CBC
HEMATOCRIT: 27.1 % — AB (ref 35.0–47.0)
HEMOGLOBIN: 9.4 g/dL — AB (ref 12.0–16.0)
MCH: 28.2 pg (ref 26.0–34.0)
MCHC: 34.5 g/dL (ref 32.0–36.0)
MCV: 81.8 fL (ref 80.0–100.0)
Platelets: 358 10*3/uL (ref 150–440)
RBC: 3.31 MIL/uL — AB (ref 3.80–5.20)
RDW: 16.3 % — ABNORMAL HIGH (ref 11.5–14.5)
WBC: 8 10*3/uL (ref 3.6–11.0)

## 2018-01-14 LAB — GLUCOSE, CAPILLARY
GLUCOSE-CAPILLARY: 126 mg/dL — AB (ref 70–99)
GLUCOSE-CAPILLARY: 143 mg/dL — AB (ref 70–99)
Glucose-Capillary: 126 mg/dL — ABNORMAL HIGH (ref 70–99)
Glucose-Capillary: 199 mg/dL — ABNORMAL HIGH (ref 70–99)
Glucose-Capillary: 119 mg/dL — ABNORMAL HIGH (ref 70–99)

## 2018-01-14 LAB — PHOSPHORUS: PHOSPHORUS: 3.1 mg/dL (ref 2.5–4.6)

## 2018-01-14 IMAGING — CT CT CHEST W/ CM
2 of 3 series · 15 of 36 positions shown, 18 images · IV contrast (iopamidol)
Comparison: 06/25/2016

CLINICAL DATA: History of stage I right upper lobe lung cancer,
status post radiation therapy. Bilateral pulmonary nodules.

EXAM:
CT CHEST WITH CONTRAST
TECHNIQUE: Multidetector CT imaging of the chest was performed during
intravenous contrast administration.
CONTRAST:  75mL [id number] IOPAMIDOL ([id number]) INJECTION 61%

[Series 2: axial st · axial · 0.62mm/px · z∈[-791,-525]mm · 12 of 157 slices shown, 15 images]
[im 12/157  mediastinal]
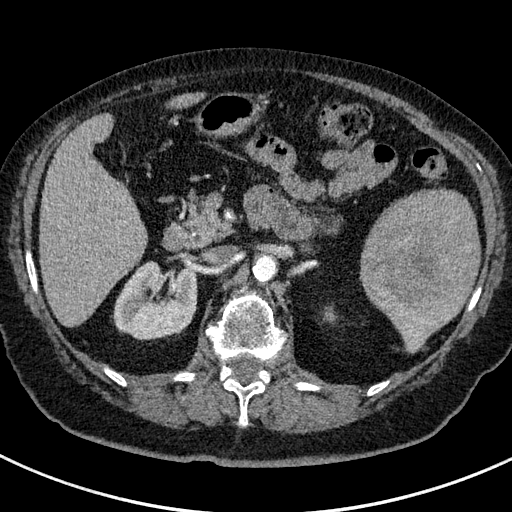
[im 12/157  lung]
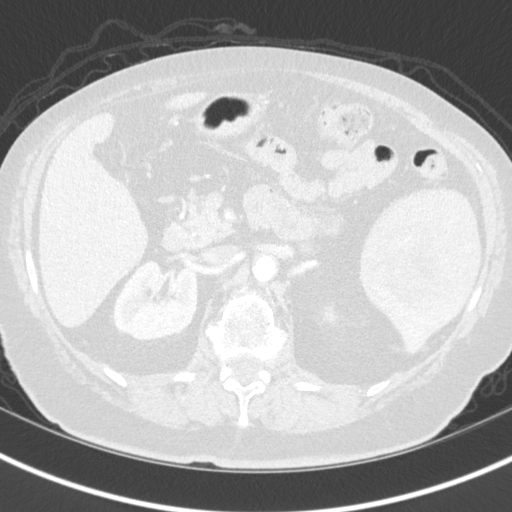
[im 24/157  lung]
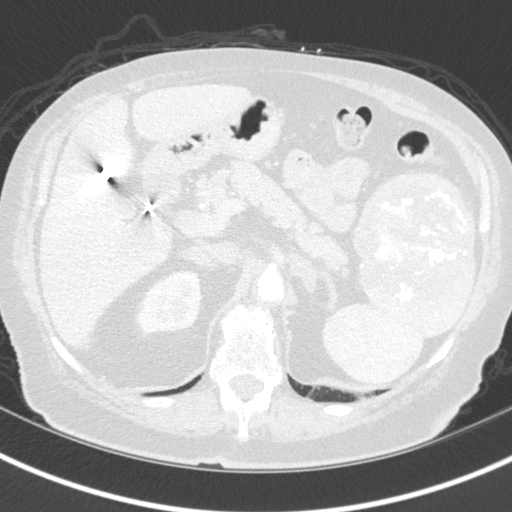
[im 35/157  lung]
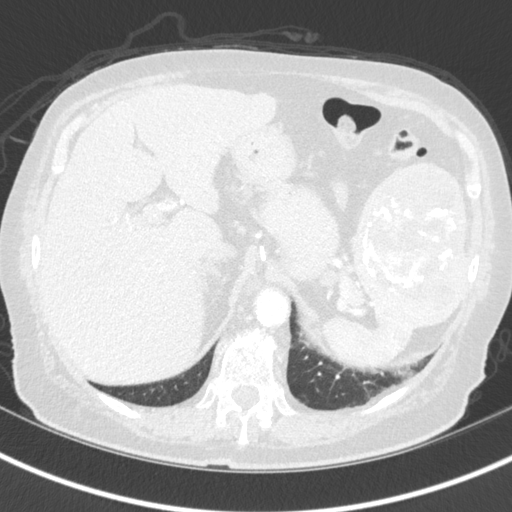
[im 47/157  lung]
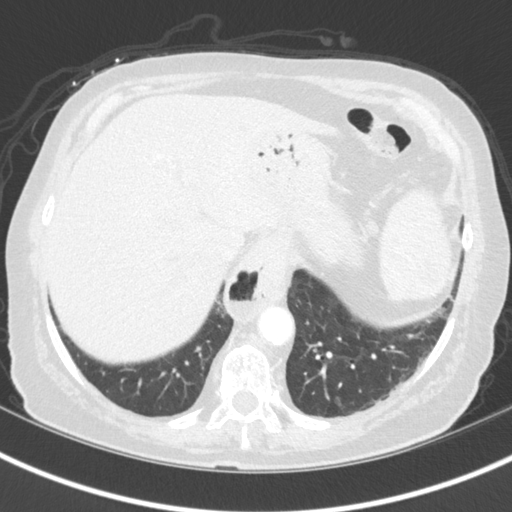
[im 58/157  mediastinal]
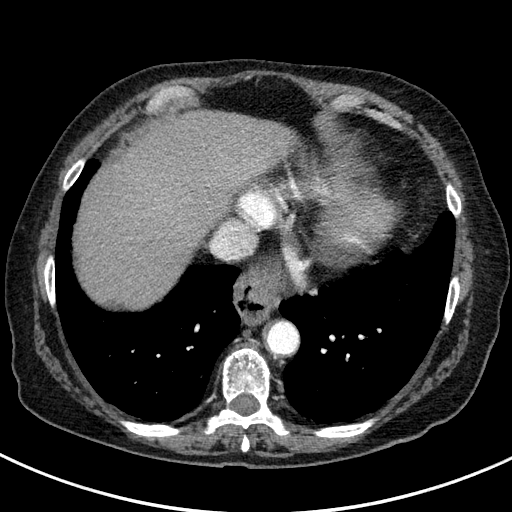
[im 58/157  lung]
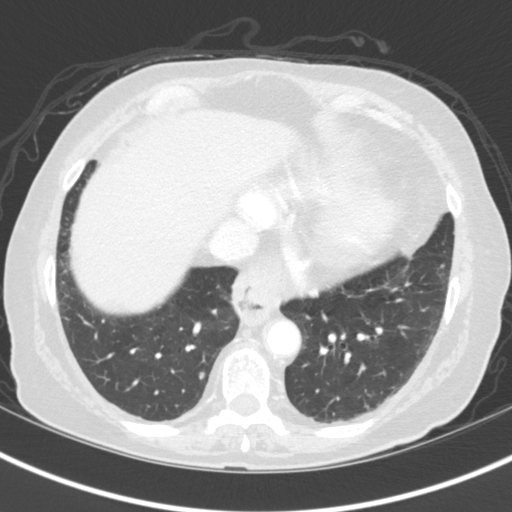
[im 70/157  lung]
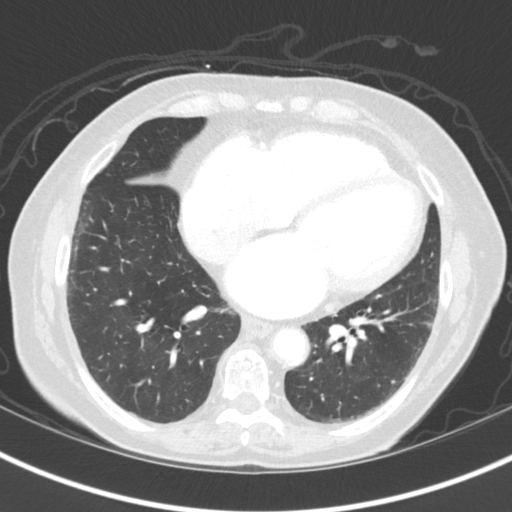
[im 87/157  lung]
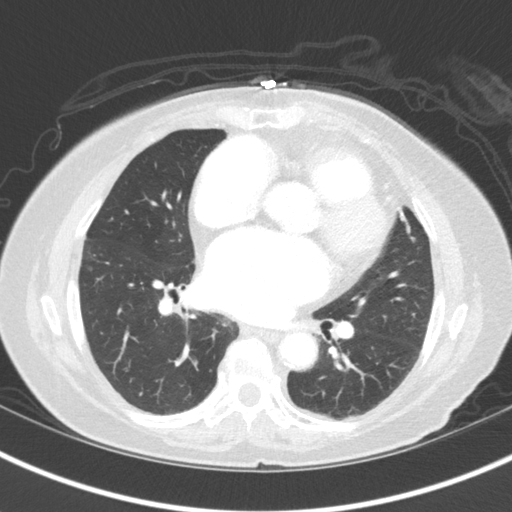
[im 99/157  lung]
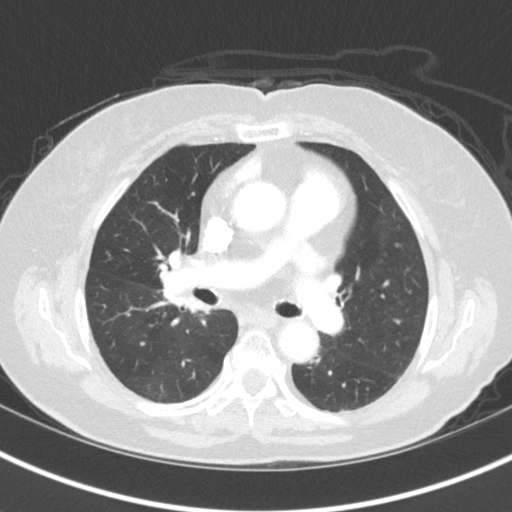
[im 110/157  mediastinal]
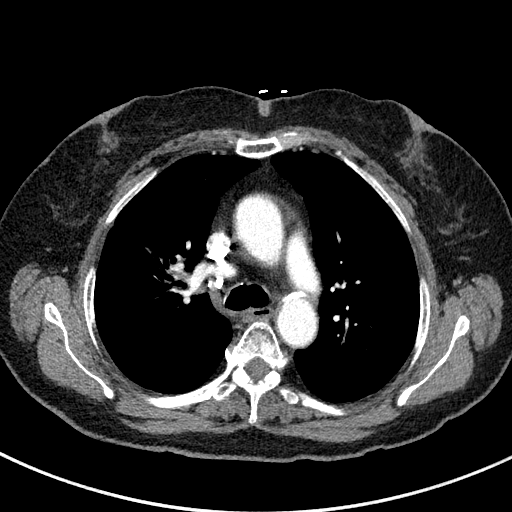
[im 110/157  lung]
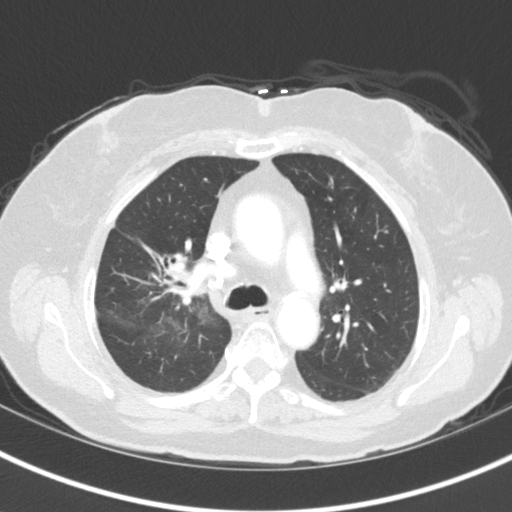
[im 122/157  lung]
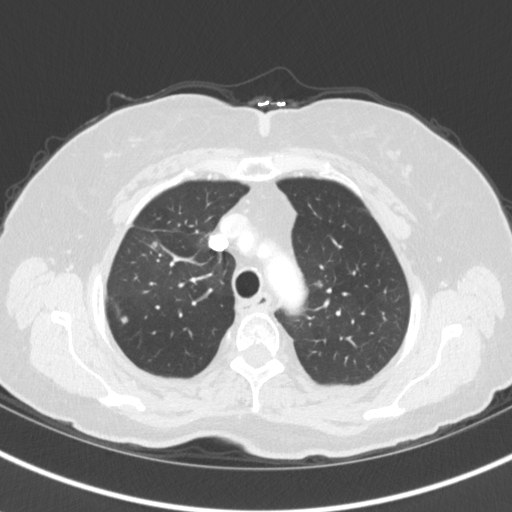
[im 133/157  lung]
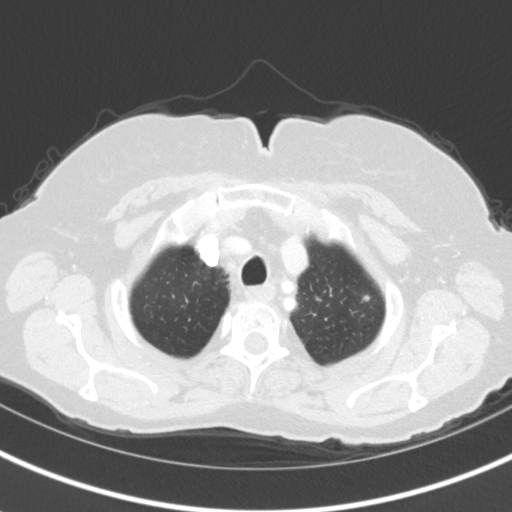
[im 145/157  lung]
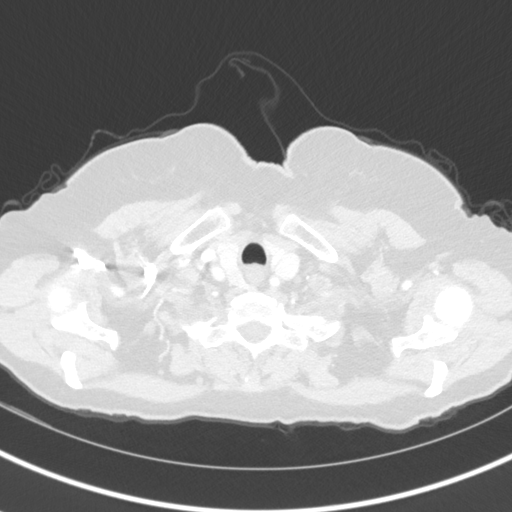

[Series 5: coronal · coronal · 0.60mm/px · 3 of 128 slices shown]
[im 26/128  lung]
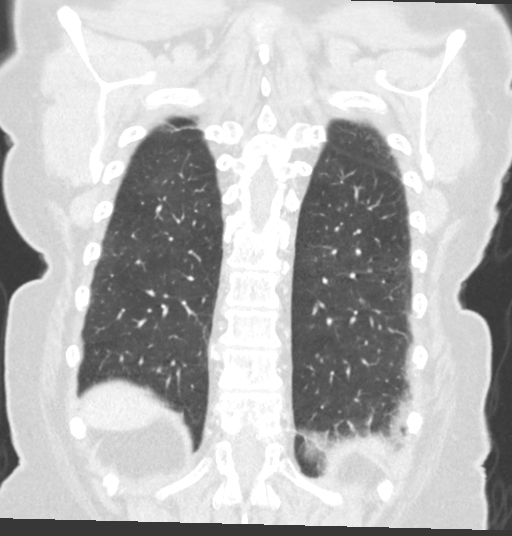
[im 51/128  lung]
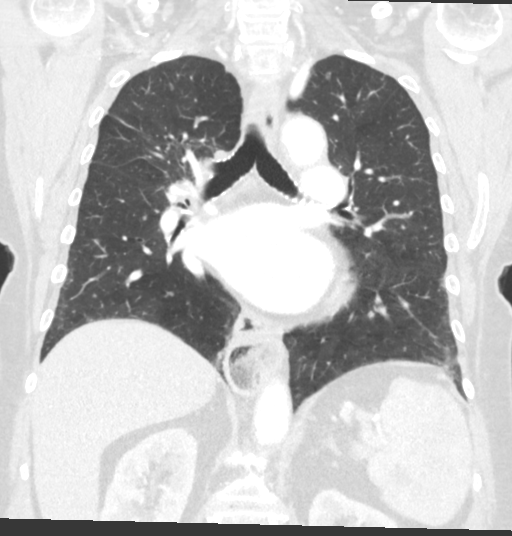
[im 77/128  lung]
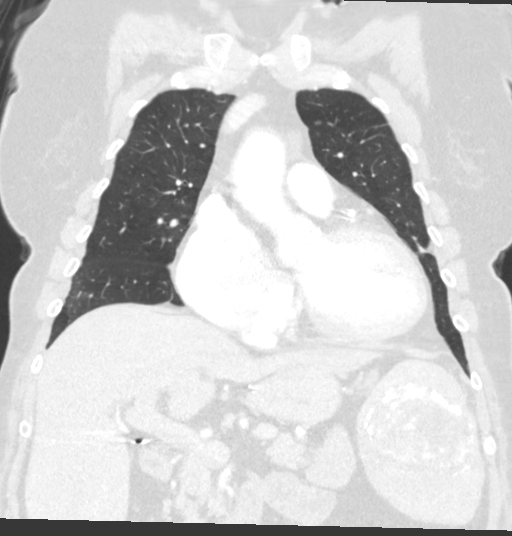

[15 of 36 positions shown; findings below may reference images not displayed]

FINDINGS: Cardiovascular: Heart is enlarged. No pericardial effusion. Coronary
artery calcification is evident. Atherosclerotic calcification is
noted in the wall of the thoracic aorta.

Mediastinum/Nodes: No mediastinal lymphadenopathy. There is no hilar
lymphadenopathy. Postsurgical change noted right hilum. Moderate
hiatal hernia noted. There is no axillary lymphadenopathy.

Lungs/Pleura: As on prior imaging, numerous pulmonary nodules are
identified bilaterally dominant index nodule identified on prior
study in the superior segment right lower lobe (image 42 series 3
today) is 7 mm, unchanged since prior. The dominant left lung lesion
identified on the prior study in the lower lobe is evident on image
84 series 3 today measuring 7 mm, stable in the interval. The
remaining visualized pulmonary nodules appear stable. No new
pulmonary nodule or mass is evident.

Right parahilar scarring suggests radiation fibrosis and appear
stable in the interval. No pulmonary edema or pleural effusion.

Upper Abdomen: Large calcified splenic lesion is the stable. No
adrenal nodule or mass although stable thickening noted left adrenal
gland. Minimal thickening right adrenal gland is stable. Surgical
clips noted in the region of the diaphragmatic hiatus. Gallbladder
surgically absent. Hypoattenuating lesion posterior right hepatic
dome is unchanged.

Musculoskeletal: Bone windows reveal no worrisome lytic or sclerotic
osseous lesions.
IMPRESSION: 1. No interval change in multiple bilateral pulmonary nodules
measuring up to about 7 mm maximum size. No new pulmonary nodule or
mass.
2. Stable appearance post treatment change right parahilar lung.
3. Coronary artery and Aortic Atherosclerois ([id number]-170.0)

## 2018-01-14 MED ORDER — ENOXAPARIN SODIUM 80 MG/0.8ML ~~LOC~~ SOLN
1.0000 mg/kg | Freq: Two times a day (BID) | SUBCUTANEOUS | Status: DC
  Administered 2018-01-14 – 2018-01-16 (×4): 80 mg via SUBCUTANEOUS
  Filled 2018-01-14 (×4): qty 0.8

## 2018-01-14 MED ORDER — POTASSIUM CHLORIDE CRYS ER 20 MEQ PO TBCR: 40.0000 meq | EXTENDED_RELEASE_TABLET | Freq: Once | ORAL | Status: DC

## 2018-01-14 MED ORDER — FAT EMULSION PLANT BASED 20 % IV EMUL
250.0000 mL | INTRAVENOUS | Status: AC
  Administered 2018-01-14: 250 mL via INTRAVENOUS
  Filled 2018-01-14: qty 250

## 2018-01-14 MED ORDER — LORAZEPAM 2 MG/ML IJ SOLN
0.5000 mg | Freq: Three times a day (TID) | INTRAMUSCULAR | Status: DC | PRN
  Administered 2018-01-14 – 2018-01-17 (×6): 0.5 mg via INTRAVENOUS
  Filled 2018-01-14 (×6): qty 1

## 2018-01-14 MED ORDER — POTASSIUM CHLORIDE 10 MEQ/100ML IV SOLN
10.0000 meq | INTRAVENOUS | Status: AC
  Administered 2018-01-14 (×4): 10 meq via INTRAVENOUS
  Filled 2018-01-14 (×4): qty 100

## 2018-01-14 MED ORDER — TRACE MINERALS CR-CU-MN-SE-ZN 10-1000-500-60 MCG/ML IV SOLN
INTRAVENOUS | Status: AC
  Administered 2018-01-14: 18:00:00 via INTRAVENOUS
  Filled 2018-01-14: qty 1680

## 2018-01-14 NOTE — Progress Notes (Signed)
Post cath void without difficulty nor discomfort.

## 2018-01-14 NOTE — Plan of Care (Signed)
  Problem: Clinical Measurements: Goal: Will remain free from infection Outcome: Progressing   Problem: Coping: Goal: Level of anxiety will decrease Outcome: Progressing   Problem: Pain Managment: Goal: General experience of comfort will improve Outcome: Progressing   Problem: Safety: Goal: Ability to remain free from injury will improve Outcome: Progressing   

## 2018-01-14 NOTE — Progress Notes (Signed)
Area of wound dehiscence cleansed with NS, covered with wet to dry dressing. Dehiscence measures 2.6 cm long and 1 cm wide.  Tissue within is red and healthy looking.

## 2018-01-14 NOTE — Progress Notes (Signed)
Subjective:    Jody [name] is a 77 y.o. female  Hospital stay day 18, 9 Days Post-Op Exlap, abdominal washout, hartmans.  Return of bowel function noted.   Mentation remains stable.  Was given ativan and pain meds again last night.   Objective:      Temp:  [97 F (36.1 C)-97.9 F (36.6 C)] 97.4 F (36.3 C) (08/10 0804) Pulse Rate:  [91-110] 99 (08/10 1241) Resp:  [16-28] 28 (08/10 1241) BP: (98-123)/(59-89) 101/68 (08/10 1241) SpO2:  [98 %-100 %] 98 % (08/10 1241) Weight:  [81 kg] 81 kg (08/10 0404)     Height: 4\' 11"  (149.9 cm) Weight: 81 kg BMI (Calculated): 36.03   Intake/Output this shift:  No intake/output data recorded.       Constitutional :  cooperative and appears stated age  Respiratory:  clear to auscultation bilaterally  Cardiovascular:  regularly irregular rhythm  Gastrointestinal: soft, non-tender; bowel sounds normal; no masses,  no organomegaly and ostomy with liquid stool, midline incision has opened down to subcutaneous fat about 3cm wide toward inferior poriton.  no fascial dehisicience palpable..   Skin: Cool and moist.   Psychiatric: Non-agitated, but confused, unable to answer questions       LABS:  CMP Latest Ref Rng & Units 01/14/2018 01/13/2018 01/12/2018  Glucose 70 - 99 mg/dL 124(H) 146(H) 152(H)  BUN 8 - 23 mg/dL 23 23 25(H)  Creatinine 0.44 - 1.00 mg/dL 0.54 0.57 0.48  Sodium 135 - 145 mmol/L 137 134(L) 136  Potassium 3.5 - 5.1 mmol/L 3.3(L) 3.4(L) 3.9  Chloride 98 - 111 mmol/L 95(L) 94(L) 95(L)  CO2 22 - 32 mmol/L 34(H) 35(H) 35(H)  Calcium 8.9 - 10.3 mg/dL 7.7(L) 7.6(L) 7.3(L)  Total Protein 6.5 - 8.1 g/dL - - 4.6(L)  Total Bilirubin 0.3 - 1.2 mg/dL - - 0.3  Alkaline Phos 38 - 126 U/L - - 112  AST 15 - 41 U/L - - 19  ALT 0 - 44 U/L - - 22   CBC Latest Ref Rng & Units 01/14/2018 01/13/2018 01/12/2018  WBC 3.6 - 11.0 K/uL 8.0 11.8(H) 13.2(H)  Hemoglobin 12.0 - 16.0 g/dL 9.4(L) 9.9(L) 9.5(L)  Hematocrit 35.0 - 47.0 % 27.1(L) 29.4(L) 27.9(L)   Platelets 150 - 440 K/uL 358 417 385    RADS: n/a Assessment:   Exlap, abdominal washout, hartmans.  Return of bowel function noted.  Leukocytosis now resolved.  Ok to feed per speech recommendations as tolerated.  Recommend continuing to stay away from any anxiolytics, narcotic meds to see if any improvement in mentation.  No acute issues as cause noted.    Asked RN to keep midline wound packed with wet-to-dry dressing.  No further intervention needed.  Fascia is intact.  Husband stated speech will re-eval oral intake on Monday.  Will await any new recs and possibility of feeding tube.  Discussed briefly different options with husband.

## 2018-01-14 NOTE — Progress Notes (Signed)
ANTICOAGULATION CONSULT NOTE - Follow up [place] for lovenox dosing Indication: atrial fibrillation  Allergies  Allergen Reactions  . Poison Oak Extract Other (See Comments)    blistering  . Ampicillin Rash  . Penicillin V Potassium Rash    Has patient had a PCN reaction causing immediate rash, facial/tongue/throat swelling, SOB or lightheadedness with hypotension: No Has patient had a PCN reaction causing severe rash involving mucus membranes or skin necrosis: No Has patient had a PCN reaction that required hospitalization: No Has patient had a PCN reaction occurring within the last 10 years: No If all of the above answers are "NO", then may proceed with Cephalosporin use.     Patient Measurements: Height: 4\' 11"  (149.9 cm) Weight: 178 lb 8 oz (81 kg) IBW/kg (Calculated) : 43.2 Enoxaparin Dosing Weight: 88.5 kg>>81 kg  Vital Signs: Temp: 97.4 F (36.3 C) (08/10 0804) Temp Source: Axillary (08/10 0804) BP: 101/68 (08/10 1241) Pulse Rate: 99 (08/10 1241)  Labs: Recent Labs    01/12/18 0421 01/13/18 0510 01/14/18 0512  HGB 9.5* 9.9* 9.4*  HCT 27.9* 29.4* 27.1*  PLT 385 417 358  CREATININE 0.48 0.57 0.54    Estimated Creatinine Clearance: 52.5 mL/min (by C-G formula based on SCr of 0.54 mg/dL).   Medications:  Eliquis d/c 8/7 PM.  Assessment: 77 yo female on full dose enoxaparin for AFib  Goal of Therapy:  Monitor platelets by anticoagulation protocol: Yes   Plan:  Lovenox 1 mg/kg q 12 hours - weight changed from 88.5>87.1>83.4>81 kg Will decrease enoxaparin dose from 90 mg to 80 mg F/u SCr and CBC at least every three days   Jody [name] L 01/14/2018,1:50 PM

## 2018-01-14 NOTE — Progress Notes (Signed)
PHARMACY - ADULT TOTAL PARENTERAL NUTRITION CONSULT NOTE   Pharmacy Consult for TPN Management  Indication: colon perforation, NPO Status   Patient Measurements: Height: 4\' 11"  (149.9 cm) Weight: 178 lb 8 oz (81 kg) IBW/kg (Calculated) : 43.2 TPN AdjBW (KG): 50.5 Body mass index is 36.05 kg/m.   Assessment:  Pharmacy consulted for TPN management for 76 yo female with diffuse inflammation on distal jejunum extending to sigmoid colon with perforation.   Patient is now being diuresed with furosemide 40 mg IV BID due to volume overload. TPN rate was also reduced on 01/11/18.   Palliative care consult was placed 01/12/18 by hospitalist.   K 3.3, Phos 3.1   TPN Access: PICC Triple Lumen  TPN start date: 12/30/17 Nutritional Goals (per RD recommendation on 7/26): KCal: 1500-1700 kcal  Protein: 87-102 grams  Fluid: > 1.5 L Goal TPN rate is 83 ml/hr  Current Nutrition:   Plan:  K 3.3 this morning, pt on lasix IV. MD has ordered potassium chloride 10 mEq IV Q1H x 4 doses and recheck BMP/mag/phos with AM labs. TPN labs to continue as scheduled.  Continue reduced TPN rate due to volume overload -  Clinimix E 5/15 at 70 mL/hr. ILE 20% at 56mL/hr.  This TPN will supply approximately 84 g AA, 252 g dextrose, 48 g lipid which is approximately 1672 kcal.   Continue MVI, trace elements to TPN  Continue SSI moderate Q4hr. BG 126-150.  Monitor TPN labs. Blood Glucose. Will recheck electrolytes per protocol.  Pharmacy will continue to monitor and adjust per consult.    Rocky Morel, Pharm.D., BCPS Clinical Pharmacist 01/14/2018 7:15 AM

## 2018-01-14 NOTE — Progress Notes (Signed)
Jody [name]: Jody [name]    MR#:  [id number]  DATE OF BIRTH:  1941-01-13  SUBJECTIVE:  CHIEF COMPLAINT:   Chief Complaint  Patient presents with  . Abdominal Pain  . Emesis  . Atrial Fibrillation  Patient without complaint, sensorium improved, marked deconditioning noted, continued anasarca noted, await further cardiology and general surgery recommendations  REVIEW OF SYSTEMS:  CONSTITUTIONAL: No fever, fatigue or weakness.  EYES: No blurred or double vision.  EARS, NOSE, AND THROAT: No tinnitus or ear pain.  RESPIRATORY: No cough, shortness of breath, wheezing or hemoptysis.  CARDIOVASCULAR: No chest pain, orthopnea, edema.  GASTROINTESTINAL: No nausea, vomiting, diarrhea or abdominal pain.  GENITOURINARY: No dysuria, hematuria.  ENDOCRINE: No polyuria, nocturia,  HEMATOLOGY: No anemia, easy bruising or bleeding SKIN: No rash or lesion. MUSCULOSKELETAL: No joint pain or arthritis.   NEUROLOGIC: No tingling, numbness, weakness.  PSYCHIATRY: No anxiety or depression.   ROS  DRUG ALLERGIES:   Allergies  Allergen Reactions  . Poison Oak Extract Other (See Comments)    blistering  . Ampicillin Rash  . Penicillin V Potassium Rash    Has patient had a PCN reaction causing immediate rash, facial/tongue/throat swelling, SOB or lightheadedness with hypotension: No Has patient had a PCN reaction causing severe rash involving mucus membranes or skin necrosis: No Has patient had a PCN reaction that required hospitalization: No Has patient had a PCN reaction occurring within the last 10 years: No If all of the above answers are "NO", then may proceed with Cephalosporin use.     VITALS:  Blood pressure 123/68, pulse 99, temperature (!) 97.4 F (36.3 C), temperature source Axillary, resp. rate (!) 24, height 4\' 11"  (1.499 m), weight 81 kg, SpO2 100 %.  PHYSICAL EXAMINATION:  GENERAL:  78 y.o.-year-old patient lying in the  bed with no acute distress.  EYES: Pupils equal, round, reactive to light and accommodation. No scleral icterus. Extraocular muscles intact.  HEENT: Head atraumatic, normocephalic. Oropharynx and nasopharynx clear.  NECK:  Supple, no jugular venous distention. No thyroid enlargement, no tenderness.  LUNGS: Normal breath sounds bilaterally, no wheezing, rales,rhonchi or crepitation. No use of accessory muscles of respiration.  CARDIOVASCULAR: S1, S2 normal. No murmurs, rubs, or gallops.  ABDOMEN: Soft, nontender, nondistended. Bowel sounds present. No organomegaly or mass.  EXTREMITIES: No pedal edema, cyanosis, or clubbing.  NEUROLOGIC: Cranial nerves II through XII are intact. Muscle strength 5/5 in all extremities. Sensation intact. Gait not checked.  PSYCHIATRIC: The patient is alert and oriented x 3.  SKIN: No obvious rash, lesion, or ulcer.   Physical Exam LABORATORY PANEL:   CBC Recent Labs  Lab 01/14/18 0512  WBC 8.0  HGB 9.4*  HCT 27.1*  PLT 358   ------------------------------------------------------------------------------------------------------------------  Chemistries  Recent Labs  Lab 01/12/18 0421  01/14/18 0512  NA 136   < > 137  K 3.9   < > 3.3*  CL 95*   < > 95*  CO2 35*   < > 34*  GLUCOSE 152*   < > 124*  BUN 25*   < > 23  CREATININE 0.48   < > 0.54  CALCIUM 7.3*   < > 7.7*  MG 1.9  --   --   AST 19  --   --   ALT 22  --   --   ALKPHOS 112  --   --   BILITOT 0.3  --   --    < > =  values in this interval not displayed.   ------------------------------------------------------------------------------------------------------------------  Cardiac Enzymes No results for input(s): TROPONINI in the last 168 hours. ------------------------------------------------------------------------------------------------------------------  RADIOLOGY:  No results found.  ASSESSMENT AND PLAN:  77 year old female with a history of atrial fibrillation chronic who is  postoperative exploratory laparotomy with abdominal washout for perforated diverticulitis.  * Acute perforated diverticulum/acute ileus  Await further general surgery recommendations-diet per surgery   *Acute metabolic encephalopathy  Resolving slowly Due to medication side effect as well as prolonged intubation/hospitalization: Continue to avoid psychotropic meds/sedating agents whenever possible, CT head negative for any acute process   *Acute hypoxic respiratory failure  S/P prolonged postoperative intubation, acute systolic/diastolic CHF ex w/ EF 40 to 45% by echo Continue congestive heart failure protocol, IV Lasix, strict I&O monitoring, daily weights, supplemental oxygen wean as tolerated  * A. 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  She has had prolonged hospitalization and prolonged intubation. Discussed with family at bedside. Palliative care consult appreciated Disposition to skilled nursing facility once cleared by cardiology and general surgery, in 2- 5 days   All the records are reviewed and case discussed with Care Management/Social Workerr. Management plans discussed with the patient, family and they are in agreement.  CODE STATUS: dnr  TOTAL TIME TAKING CARE OF THIS PATIENT: 35 minutes.     POSSIBLE D/C IN 2-5 DAYS, DEPENDING ON CLINICAL CONDITION.   [name] Salary M.D on 01/14/2018   Between 7am to 6pm - Pager - [phone]  After 6pm go to [email or web address] -  password [email or web address] [facility]  Office  [phone]  CC: Primary care physician; Jody Hire, MD  Note: This dictation was prepared with Dragon dictation along with smaller phrase technology. Any transcriptional errors that result from this process are unintentional.

## 2018-01-15 LAB — BASIC METABOLIC PANEL
Anion gap: 8 (ref 5–15)
BUN: 25 mg/dL — AB (ref 8–23)
CO2: 33 mmol/L — ABNORMAL HIGH (ref 22–32)
Calcium: 7.7 mg/dL — ABNORMAL LOW (ref 8.9–10.3)
Chloride: 95 mmol/L — ABNORMAL LOW (ref 98–111)
Creatinine, Ser: 0.53 mg/dL (ref 0.44–1.00)
GFR calc Af Amer: >60 mL/min (ref >60)
GFR calc non Af Amer: >60 mL/min (ref >60)
GLUCOSE: 175 mg/dL — AB (ref 70–99)
POTASSIUM: 3.6 mmol/L (ref 3.5–5.1)
SODIUM: 136 mmol/L (ref 135–145)

## 2018-01-15 LAB — GLUCOSE, CAPILLARY
GLUCOSE-CAPILLARY: 110 mg/dL — AB (ref 70–99)
GLUCOSE-CAPILLARY: 121 mg/dL — AB (ref 70–99)
GLUCOSE-CAPILLARY: 125 mg/dL — AB (ref 70–99)
GLUCOSE-CAPILLARY: 148 mg/dL — AB (ref 70–99)
Glucose-Capillary: 132 mg/dL — ABNORMAL HIGH (ref 70–99)
Glucose-Capillary: 119 mg/dL — ABNORMAL HIGH (ref 70–99)

## 2018-01-15 LAB — MAGNESIUM: Magnesium: 2 mg/dL (ref 1.7–2.4)

## 2018-01-15 LAB — PHOSPHORUS: Phosphorus: 3.4 mg/dL (ref 2.5–4.6)

## 2018-01-15 MED ORDER — ALTEPLASE 2 MG IJ SOLR
2.0000 mg | Freq: Once | INTRAMUSCULAR | Status: AC
  Administered 2018-01-15: 2 mg
  Filled 2018-01-15: qty 2

## 2018-01-15 MED ORDER — TRACE MINERALS CR-CU-MN-SE-ZN 10-1000-500-60 MCG/ML IV SOLN
INTRAVENOUS | Status: AC
  Administered 2018-01-15: 19:00:00 via INTRAVENOUS
  Filled 2018-01-15: qty 1680

## 2018-01-15 MED ORDER — FAT EMULSION PLANT BASED 20 % IV EMUL
250.0000 mL | INTRAVENOUS | Status: AC
  Administered 2018-01-15: 250 mL via INTRAVENOUS
  Filled 2018-01-15: qty 250

## 2018-01-15 NOTE — Progress Notes (Signed)
PHARMACY - ADULT TOTAL PARENTERAL NUTRITION CONSULT NOTE   Pharmacy Consult for TPN Management  Indication: colon perforation, NPO Status   Patient Measurements: Height: 4\' 11"  (149.9 cm) Weight: 173 lb 1 oz (78.5 kg) IBW/kg (Calculated) : 43.2 TPN AdjBW (KG): 50.5 Body mass index is 34.95 kg/m.   Assessment:  Pharmacy consulted for TPN management for 77 yo female with diffuse inflammation on distal jejunum extending to sigmoid colon with perforation.   Patient being diuresed with furosemide 40 mg IV BID due to volume overload. TPN rate was also reduced on 01/11/18.   Palliative care consult was placed 01/12/18 by hospitalist.   8/11: K 3.6, Phos 3.4, Mag 2.0  TPN Access: PICC Triple Lumen  TPN start date: 12/30/17 Nutritional Goals (per RD recommendation on 7/26): KCal: 1500-1700 kcal  Protein: 87-102 grams  Fluid: > 1.5 L Goal TPN rate is 83 ml/hr  Current Nutrition:   Plan:  Continue reduced TPN rate due to volume overload -  Clinimix E 5/15 at 70 mL/hr. ILE 20% at 39mL/hr x12h.  Continue MVI, trace elements in TPN  No additional electrolyte supplementation needed at this time  Continue SSI moderate Q4hr. BG 110-199.  Monitor TPN labs, blood glucose. Will recheck electrolytes labs in AM.  Pharmacy will continue to monitor and adjust per consult.    Rocky Morel, Pharm.D., BCPS Clinical Pharmacist 01/15/2018 1:02 PM

## 2018-01-15 NOTE — Plan of Care (Signed)
  Problem: Clinical Measurements: Goal: Will remain free from infection Outcome: Progressing   Problem: Coping: Goal: Level of anxiety will decrease Outcome: Progressing   Problem: Pain Managment: Goal: General experience of comfort will improve Outcome: Progressing   Problem: Safety: Goal: Ability to remain free from injury will improve Outcome: Progressing   

## 2018-01-15 NOTE — Progress Notes (Signed)
Jody Jody [name] at [place] NAME: Jody Jody [name]    MR#:  [id number]  DATE OF BIRTH:  10/26/1940  SUBJECTIVE:  CHIEF COMPLAINT:   Chief Complaint  Patient presents with  . Abdominal Pain  . Emesis  . Atrial Fibrillation  Patient without complaint, husband at the bedside, for speech reevaluation on tomorrow  REVIEW OF SYSTEMS:  CONSTITUTIONAL: No fever, fatigue or weakness.  EYES: No blurred or double vision.  EARS, NOSE, AND THROAT: No tinnitus or ear pain.  RESPIRATORY: No cough, shortness of breath, wheezing or hemoptysis.  CARDIOVASCULAR: No chest pain, orthopnea, edema.  GASTROINTESTINAL: No nausea, vomiting, diarrhea or abdominal pain.  GENITOURINARY: No dysuria, hematuria.  ENDOCRINE: No polyuria, nocturia,  HEMATOLOGY: No anemia, easy bruising or bleeding SKIN: No rash or lesion. MUSCULOSKELETAL: No joint pain or arthritis.   NEUROLOGIC: No tingling, numbness, weakness.  PSYCHIATRY: No anxiety or depression.   ROS  DRUG ALLERGIES:   Allergies  Allergen Reactions  . Poison Oak Extract Other (See Comments)    blistering  . Ampicillin Rash  . Penicillin V Potassium Rash    Has patient had a PCN reaction causing immediate rash, facial/tongue/throat swelling, SOB or lightheadedness with hypotension: No Has patient had a PCN reaction causing severe rash involving mucus membranes or skin necrosis: No Has patient had a PCN reaction that required hospitalization: No Has patient had a PCN reaction occurring within the last 10 years: No If all of the above answers are "NO", then may proceed with Cephalosporin use.     VITALS:  Blood pressure 112/78, pulse (!) 105, temperature 97.7 F (36.5 C), temperature source Oral, resp. rate (!) 32, height 4\' 11"  (1.499 m), weight 78.5 kg, SpO2 100 %.  PHYSICAL EXAMINATION:  GENERAL:  77 y.o.-year-old patient lying in the bed with no acute distress.  EYES: Pupils equal, round, reactive to light and  accommodation. No scleral icterus. Extraocular muscles intact.  HEENT: Head atraumatic, normocephalic. Oropharynx and nasopharynx clear.  NECK:  Supple, no jugular venous distention. No thyroid enlargement, no tenderness.  LUNGS: Normal breath sounds bilaterally, no wheezing, rales,rhonchi or crepitation. No use of accessory muscles of respiration.  CARDIOVASCULAR: S1, S2 normal. No murmurs, rubs, or gallops.  ABDOMEN: Soft, nontender, nondistended. Bowel sounds present. No organomegaly or mass.  EXTREMITIES: No pedal edema, cyanosis, or clubbing.  NEUROLOGIC: Cranial nerves II through XII are intact. Muscle strength 5/5 in all extremities. Sensation intact. Gait not checked.  PSYCHIATRIC: The patient is alert and oriented x 3.  SKIN: No obvious rash, lesion, or ulcer.   Physical Exam LABORATORY PANEL:   CBC Recent Labs  Lab 01/14/18 0512  WBC 8.0  HGB 9.4*  HCT 27.1*  PLT 358   ------------------------------------------------------------------------------------------------------------------  Chemistries  Recent Labs  Lab 01/12/18 0421  01/15/18 0426  NA 136   < > 136  K 3.9   < > 3.6  CL 95*   < > 95*  CO2 35*   < > 33*  GLUCOSE 152*   < > 175*  BUN 25*   < > 25*  CREATININE 0.48   < > 0.53  CALCIUM 7.3*   < > 7.7*  MG 1.9  --  2.0  AST 19  --   --   ALT 22  --   --   ALKPHOS 112  --   --   BILITOT 0.3  --   --    < > = values in this  interval not displayed.   ------------------------------------------------------------------------------------------------------------------  Cardiac Enzymes No results for input(s): TROPONINI in the last 168 hours. ------------------------------------------------------------------------------------------------------------------  RADIOLOGY:  No results found.  ASSESSMENT AND PLAN:   77 year old female with a history of atrial fibrillation chronic who is postoperative exploratory laparotomy with abdominal washout for perforated  diverticulitis.  *Acute perforated diverticulum/acute ileus  Await further general surgery recommendations-diet per surgery   *Acute metabolic encephalopathy  Resolving slowly Due to medication side effect as well as prolonged intubation/hospitalization: Continue to avoid psychotropic meds/sedating agents whenever possible, CT head negative for any acute process   *Acute hypoxic respiratory failure  S/P prolonged postoperative intubation, acute systolic/diastolic CHF ex w/ EF 40 to 45% by echo Continue congestive heart failure protocol, IV Lasix, strict I&O monitoring, daily weights, supplemental oxygen wean as tolerated  *A. 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 She has had prolonged hospitalization and prolonged intubation. Discussed with family at bedside. Palliative care consultappreciated Disposition to skilled nursing facility once cleared by cardiology and general surgery, in 2- 5 days   All the records are reviewed and case discussed with Care Management/Social Workerr. Management plans discussed with the patient, family and they are in agreement.  CODE STATUS: dnr  TOTAL TIME TAKING CARE OF THIS PATIENT: 45 minutes.     POSSIBLE D/C IN 1-5 DAYS, DEPENDING ON CLINICAL CONDITION.   Jody Jody [name] Salary M.D on 01/15/2018   Between 7am to 6pm - Pager - [phone]  After 6pm go to [email or web address] - password [email or web address] [place] [facility]  Office   [phone]  CC: Primary care physician; Jody Hire, MD  Note: This dictation was prepared with Dragon dictation along with smaller phrase technology. Any transcriptional errors that result from this process are unintentional.

## 2018-01-16 DIAGNOSIS — K631 Perforation of intestine (nontraumatic): Secondary | ICD-10-CM

## 2018-01-16 DIAGNOSIS — R630 Anorexia: Secondary | ICD-10-CM

## 2018-01-16 LAB — COMPREHENSIVE METABOLIC PANEL
ALT: 50 U/L — AB (ref 0–44)
AST: 61 U/L — AB (ref 15–41)
Albumin: 2.4 g/dL — ABNORMAL LOW (ref 3.5–5.0)
Alkaline Phosphatase: 160 U/L — ABNORMAL HIGH (ref 38–126)
Anion gap: 8 (ref 5–15)
BUN: 25 mg/dL — AB (ref 8–23)
CHLORIDE: 95 mmol/L — AB (ref 98–111)
CO2: 32 mmol/L (ref 22–32)
CREATININE: 0.64 mg/dL (ref 0.44–1.00)
Calcium: 7.9 mg/dL — ABNORMAL LOW (ref 8.9–10.3)
GFR calc Af Amer: >60 mL/min (ref >60)
GFR calc non Af Amer: >60 mL/min (ref >60)
Glucose, Bld: 160 mg/dL — ABNORMAL HIGH (ref 70–99)
Potassium: 3.3 mmol/L — ABNORMAL LOW (ref 3.5–5.1)
SODIUM: 135 mmol/L (ref 135–145)
Total Bilirubin: 0.5 mg/dL (ref 0.3–1.2)
Total Protein: 5.3 g/dL — ABNORMAL LOW (ref 6.5–8.1)

## 2018-01-16 LAB — GLUCOSE, CAPILLARY
GLUCOSE-CAPILLARY: 117 mg/dL — AB (ref 70–99)
GLUCOSE-CAPILLARY: 131 mg/dL — AB (ref 70–99)
GLUCOSE-CAPILLARY: 154 mg/dL — AB (ref 70–99)
GLUCOSE-CAPILLARY: 92 mg/dL (ref 70–99)
Glucose-Capillary: 136 mg/dL — ABNORMAL HIGH (ref 70–99)
Glucose-Capillary: 129 mg/dL — ABNORMAL HIGH (ref 70–99)

## 2018-01-16 LAB — TRIGLYCERIDES: TRIGLYCERIDES: 119 mg/dL (ref <150)

## 2018-01-16 LAB — PREALBUMIN: Prealbumin: 19.7 mg/dL (ref 18–38)

## 2018-01-16 LAB — MAGNESIUM: Magnesium: 2.1 mg/dL (ref 1.7–2.4)

## 2018-01-16 LAB — PHOSPHORUS: Phosphorus: 3.4 mg/dL (ref 2.5–4.6)

## 2018-01-16 MED ORDER — ALBUMIN HUMAN 25 % IV SOLN
25.0000 g | Freq: Every day | INTRAVENOUS | Status: AC
  Administered 2018-01-16 – 2018-01-18 (×3): 25 g via INTRAVENOUS
  Filled 2018-01-16 (×5): qty 100

## 2018-01-16 MED ORDER — TRACE MINERALS CR-CU-MN-SE-ZN 10-1000-500-60 MCG/ML IV SOLN
INTRAVENOUS | Status: AC
Start: 2018-01-16 — End: 2018-01-17
  Administered 2018-01-16: 18:00:00 via INTRAVENOUS
  Filled 2018-01-16: qty 1680

## 2018-01-16 MED ORDER — METOPROLOL TARTRATE 25 MG PO TABS
25.0000 mg | ORAL_TABLET | Freq: Two times a day (BID) | ORAL | Status: AC
  Administered 2018-01-16 – 2018-01-18 (×4): 25 mg via ORAL
  Filled 2018-01-16 (×4): qty 1

## 2018-01-16 MED ORDER — DILTIAZEM HCL 30 MG PO TABS
60.0000 mg | ORAL_TABLET | Freq: Three times a day (TID) | ORAL | Status: DC
  Administered 2018-01-16 – 2018-01-18 (×8): 60 mg via ORAL
  Filled 2018-01-16 (×9): qty 2

## 2018-01-16 MED ORDER — APIXABAN 5 MG PO TABS
5.0000 mg | ORAL_TABLET | Freq: Two times a day (BID) | ORAL | Status: DC
  Administered 2018-01-16 – 2018-01-22 (×12): 5 mg via ORAL
  Filled 2018-01-16 (×12): qty 1

## 2018-01-16 MED ORDER — POTASSIUM CHLORIDE 10 MEQ/100ML IV SOLN
10.0000 meq | INTRAVENOUS | Status: AC
  Administered 2018-01-16 (×3): 10 meq via INTRAVENOUS
  Filled 2018-01-16 (×4): qty 100

## 2018-01-16 MED ORDER — FAT EMULSION PLANT BASED 20 % IV EMUL
250.0000 mL | INTRAVENOUS | Status: AC
  Administered 2018-01-16: 250 mL via INTRAVENOUS
  Filled 2018-01-16: qty 250

## 2018-01-16 MED ORDER — AMIODARONE HCL 200 MG PO TABS
400.0000 mg | ORAL_TABLET | Freq: Two times a day (BID) | ORAL | Status: DC
  Administered 2018-01-16 – 2018-01-22 (×13): 400 mg via ORAL
  Filled 2018-01-16 (×13): qty 2

## 2018-01-16 MED ORDER — DIGOXIN 125 MCG PO TABS
0.1250 mg | ORAL_TABLET | Freq: Every day | ORAL | Status: DC
  Administered 2018-01-16 – 2018-01-22 (×7): 0.125 mg via ORAL
  Filled 2018-01-16 (×7): qty 1

## 2018-01-16 NOTE — Progress Notes (Signed)
ANTICOAGULATION CONSULT NOTE - Initial Consult  Pharmacy Consult for Eliquis Indication: atrial fibrillation  Allergies  Allergen Reactions  . Poison Oak Extract Other (See Comments)    blistering  . Ampicillin Rash  . Penicillin V Potassium Rash    Has patient had a PCN reaction causing immediate rash, facial/tongue/throat swelling, SOB or lightheadedness with hypotension: No Has patient had a PCN reaction causing severe rash involving mucus membranes or skin necrosis: No Has patient had a PCN reaction that required hospitalization: No Has patient had a PCN reaction occurring within the last 10 years: No If all of the above answers are "NO", then may proceed with Cephalosporin use.     Patient Measurements: Height: 4\' 11"  (149.9 cm) Weight: 168 lb 11.2 oz (76.5 kg) IBW/kg (Calculated) : 43.2 Heparin Dosing Weight:   Vital Signs: Temp: 97.7 F (36.5 C) (08/12 0845) Temp Source: Oral (08/12 0845) BP: 115/55 (08/12 0845) Pulse Rate: 115 (08/12 0845)  Labs: Recent Labs    01/14/18 0512 01/15/18 0426 01/16/18 0459  HGB 9.4*  --   --   HCT 27.1*  --   --   PLT 358  --   --   CREATININE 0.54 0.53 0.64    Estimated Creatinine Clearance: 50.9 mL/min (by C-G formula based on SCr of 0.64 mg/dL).   Medical History: Past Medical History:  Diagnosis Date  . Asthma   . Atrial fibrillation ([place])   . CHF (congestive heart failure) ([place])   . Lung cancer ([place])    10 yrs ago    Medications:  Infusions:  . Marland KitchenTPN (CLINIMIX-E) Adult 70 mL/hr at 01/15/18 1842  . Marland KitchenTPN (CLINIMIX-E) Adult    . albumin human 25 g (01/16/18 1132)  . Fat emulsion    . potassium chloride 10 mEq (01/16/18 1401)  . sodium chloride      Assessment: 77 yof cc abdominal pain/emesis/AF. Had been on Lovenox therapeutic dosing for AF while NPO but takes Eliquis PTA. Today patient was advanced to dysphagia 1 diet - pharmacy consulted to resume Eliquis. Last LMWH this AM at approximately 10:00.  Goal of  Therapy:   Monitor platelets by anticoagulation protocol: Yes   Plan:  Eliquis 5 mg po BID for AF starting this evening at 22:00 (approximately 12 hours after LMWH dose).   Laural Benes, Pharm.D., BCPS Clinical Pharmacist 01/16/2018,3:22 PM

## 2018-01-16 NOTE — Progress Notes (Addendum)
PHARMACY - ADULT TOTAL PARENTERAL NUTRITION CONSULT NOTE   Pharmacy Consult for TPN Management  Indication: colon perforation, NPO Status   Patient Measurements: Height: 4\' 11"  (149.9 cm) Weight: 168 lb 11.2 oz (76.5 kg) IBW/kg (Calculated) : 43.2 TPN AdjBW (KG): 50.5 Body mass index is 34.07 kg/m.   Assessment:  Pharmacy consulted for TPN management for 77 yo female with diffuse inflammation on distal jejunum extending to sigmoid colon with perforation.   Patient being diuresed with furosemide 40 mg IV BID due to volume overload. TPN rate was also reduced on 01/11/18.   Palliative care consult was placed 01/12/18 by hospitalist.   8/12: K 3.3, Ca 7.9, adjusted Ca 9.2, Phos 3.4, Mag 2.1  TPN Access: PICC Triple Lumen  TPN start date: 12/30/17 Nutritional Goals (per RD recommendation on 7/26): KCal: 1500-1700 kcal  Protein: 87-102 grams  Fluid: > 1.5 L Goal TPN rate is 83 ml/hr  Current Nutrition:   Plan:  Bowel function returned over weekend, leukocytosis resolved. Patient has declined feeding tube, but advanced to dysphagia 1 diet per SLP. Per RD we will continue goal rate at least one more day (today). Continue reduced TPN rate due to volume overload -  Clinimix E 5/15 at 70 mL/hr. ILE 20% at 19mL/hr x12h.  Continue MVI, trace elements in TPN  Will give potassium chloride 10 mEq Q2H x 4 doses and recheck BMP with AM labs  Continue SSI moderate Q4hr. BG 119-160.  Monitor TPN labs, blood glucose. Will recheck BMP in AM and TPN labs per protocol.  Pharmacy will continue to monitor and adjust per consult.    Laural Benes, Pharm.D., BCPS Clinical Pharmacist 01/16/2018 8:46 AM

## 2018-01-16 NOTE — Plan of Care (Signed)
  Problem: Education: Goal: Knowledge of General Education information will improve Description Including pain rating scale, medication(s)/side effects and non-pharmacologic comfort measures Outcome: Not Progressing Note:  Patient's neurological status not quite sufficient to meet this goal at this time. Will continue to monitor neurological status. Wenda Low [facility]

## 2018-01-16 NOTE — Consult Note (Signed)
[facility] Nurse ostomy follow up Stoma type/location: 1 3-8" stoma measured through pouch that was placed 01-15-18 Stomal assessment/size: 1 3-8"  Peristomal assessment: pouch intact   Treatment options for stomal/peristomal skin: two piece pouch applied yesterday and is intact.  Spouse is at bedside. DIscussed ostomy care, frequency of pouch changes.  Spouse states she will discharge to SNF.  Output soft brown stool Ostomy pouching: 2pc.  2 3-4" Education provided: See above Enrolled patient in [employer] Discharge program: No WOC team will follow.  Domenic Moras RN BSN [place] Pager [phone]

## 2018-01-16 NOTE — Care Management (Signed)
Swallowing eval today.  Recommendaton Dysphagia 1 with nectar thick by teaspoon only. SLP will follow and upgrade consistencies as able.  Remains on TPN

## 2018-01-16 NOTE — Progress Notes (Signed)
[facility] [place] NAME: Jody [name]    MR#:  [id number]  DATE OF BIRTH:  05/24/1941  SUBJECTIVE:  CHIEF COMPLAINT:   Chief Complaint  Patient presents with  . Abdominal Pain  . Emesis  . Atrial Fibrillation   -Complicated hospital course, perforated colon-status post colostomy.  Currently on TPN. -Passed swallow eval today  REVIEW OF SYSTEMS:  Review of Systems  Constitutional: Positive for malaise/fatigue and weight loss. Negative for chills and fever.  HENT: Negative for congestion, ear discharge, hearing loss and nosebleeds.   Eyes: Negative for blurred vision and double vision.  Respiratory: Negative for cough, shortness of breath and wheezing.   Cardiovascular: Negative for chest pain and palpitations.  Gastrointestinal: Positive for abdominal pain. Negative for constipation, diarrhea, nausea and vomiting.  Genitourinary: Negative for dysuria.  Musculoskeletal: Negative for myalgias.  Neurological: Negative for dizziness, speech change, focal weakness, seizures, weakness and headaches.  Psychiatric/Behavioral: Negative for depression.    DRUG ALLERGIES:   Allergies  Allergen Reactions  . Poison Oak Extract Other (See Comments)    blistering  . Ampicillin Rash  . Penicillin V Potassium Rash    Has patient had a PCN reaction causing immediate rash, facial/tongue/throat swelling, SOB or lightheadedness with hypotension: No Has patient had a PCN reaction causing severe rash involving mucus membranes or skin necrosis: No Has patient had a PCN reaction that required hospitalization: No Has patient had a PCN reaction occurring within the last 10 years: No If all of the above answers are "NO", then may proceed with Cephalosporin use.     VITALS:  Blood pressure (!) 115/55, pulse (!) 115, temperature 97.7 F (36.5 C), temperature source Oral, resp. rate 18, height 4\' 11"  (1.499 m), weight 76.5 kg, SpO2 100 %.  PHYSICAL  EXAMINATION:  Physical Exam  GENERAL:  76 y.o.-year-old patient lying in the bed with no acute distress.  EYES: Pupils equal, round, reactive to light and accommodation. No scleral icterus. Extraocular muscles intact.  HEENT: Head atraumatic, normocephalic. Oropharynx and nasopharynx clear.  NECK:  Supple, no jugular venous distention. No thyroid enlargement, no tenderness.  LUNGS: Normal breath sounds bilaterally, no wheezing,  rhonchi or crepitation. No use of accessory muscles of respiration.  Bibasilar coarse Rales CARDIOVASCULAR: S1, S2 normal. No  rubs, or gallops.  2/6 systolic murmur is present ABDOMEN: Soft, nontender, nondistended.  Left lower quadrant colostomy bag in place.  Bowel sounds present. No organomegaly or mass.  EXTREMITIES: No pedal edema, cyanosis, or clubbing.  NEUROLOGIC: Cranial nerves II through XII are intact. Muscle strength 5/5 in all extremities. Sensation intact. Gait not checked.  Global weakness noted. PSYCHIATRIC: The patient is alert and oriented x 3.  SKIN: No obvious rash, lesion, or ulcer.    LABORATORY PANEL:   CBC Recent Labs  Lab 01/14/18 0512  WBC 8.0  HGB 9.4*  HCT 27.1*  PLT 358   ------------------------------------------------------------------------------------------------------------------  Chemistries  Recent Labs  Lab 01/16/18 0459  NA 135  K 3.3*  CL 95*  CO2 32  GLUCOSE 160*  BUN 25*  CREATININE 0.64  CALCIUM 7.9*  MG 2.1  AST 61*  ALT 50*  ALKPHOS 160*  BILITOT 0.5   ------------------------------------------------------------------------------------------------------------------  Cardiac Enzymes No results for input(s): TROPONINI in the last 168 hours. ------------------------------------------------------------------------------------------------------------------  RADIOLOGY:  No results found.  EKG:   Orders placed or performed during the hospital encounter of 12/27/17  . EKG 12-Lead  . EKG 12-Lead    .  ED EKG within 10 minutes  . ED EKG within 10 minutes    ASSESSMENT AND PLAN:   Female with past medical history significant for A. fib on Eliquis, history of lung cancer, hypertension and diabetes brought to hospital secondary to abdominal pain and noted to have perforated bowels  1.  Perforated bowels-likely secondary to diverticulitis.  Appreciate surgical input. -Patient is status post laparotomy, left colon resection and colostomy placement. -Poor oral intake, on TPN -Speech therapy consult today.  Transition from TPN to oral intake gradually.  2.  Aspiration pneumonia-speech consult pending.  Prior history of aspiration -Patient antibiotics.  On IV Lasix -Follow-up chest x-ray in a.m.  3.  A. fib with RVR-discontinue amiodarone drip.  Started on oral amiodarone.  Also on digoxin, Cardizem and metoprolol. -Adjust medications if blood pressure is stable -Change Lovenox to oral Eliquis for anticoagulation  4.  Hypoalbuminemia-being replaced  5.  DVT prophylaxis-already on Eliquis   Physical therapy consult  All the records are reviewed and case discussed with Care Management/Social Workerr. Management plans discussed with the patient, family and they are in agreement.  CODE STATUS: DNR  TOTAL TIME TAKING CARE OF THIS PATIENT: 37 minutes.   POSSIBLE D/C IN 2-3 DAYS, DEPENDING ON CLINICAL CONDITION.   Gladstone Lighter M.D on 01/16/2018 at 2:36 PM  Between 7am to 6pm - Pager - [phone]  After 6pm go to [email or web address] - password [email or web address] [facility]  Office  [phone]  CC: Primary care physician; Baxter Hire, MD

## 2018-01-16 NOTE — Evaluation (Signed)
Clinical/Bedside Swallow Evaluation Patient Details  Name: Jody [name] MRN: [id number] Date of Birth: 07-Dec-1940  Today's Date: 01/16/2018 Time: SLP Start Time (ACUTE ONLY): 0830 SLP Stop Time (ACUTE ONLY): 0905 SLP Time Calculation (min) (ACUTE ONLY): 35 min  Past Medical History:  Past Medical History:  Diagnosis Date  . Asthma   . Atrial fibrillation ([place])   . CHF (congestive heart failure) ([place])   . Lung cancer ([place])    10 yrs ago   Past Surgical History:  Past Surgical History:  Procedure Laterality Date  . COLOSTOMY N/A 01/05/2018   Procedure: COLOSTOMY;  Surgeon: Benjamine Sprague, DO;  Location: [facility] ORS;  Service: General;  Laterality: N/A;  . LAPAROTOMY N/A 01/05/2018   Procedure: EXPLORATORY LAPAROTOMY/ POSSIBLE BOWEL OBSTRUCTION;  Surgeon: Benjamine Sprague, DO;  Location: [facility] ORS;  Service: General;  Laterality: N/A;  . LYSIS OF ADHESION N/A 01/05/2018   Procedure: LYSIS OF ADHESION;  Surgeon: Benjamine Sprague, DO;  Location: [facility] ORS;  Service: General;  Laterality: N/A;   HPI:  Pt is a 77 y.o. female who was seen in ED for abdominal pain.  Symptoms were first noted 2 days ago. Pain is sharp, non-radiating, focal to abdomen.  Associated with nausea, exacerbated by palpation.  Pain continued to worsen so was brought by ambulance to ED.  Workup showed possible perforated diverticulitis so surgery consulted. She was also noted to be in Afib with RVR so was started on diltiazem drip.  She was admitted w/ dx of Diverticulitis with contained bowel perforation, sepsis, SBO.  Pt underwent suegical intervention and was orally intubated 8/1-01/08/2018,  Since then, mental status has improved a bit.  She has been encephalopathic and also given Ativan and morphine.  Currently, she was awake and saying few words but exhibiting decreased awareness oveall.    Assessment / Plan / Recommendation Clinical Impression    Re-evaluation of swallow requested by MD to restart safest possible diet due to increased  patient level of alertness. Patient presents with mild to moderate oropharyngeal dysphagia. Oral phase c/b mild oral transit delay with puree and solids;however A-P transit and oral preparation of all boluses WFL. Oral mech exam reveals generalized weakness of all oral motor structures.  No oral residue noted with any consistency tested. Patient appears to be markedly more alert than when previously evaluated. Patient tolerated an ice chip, tsp's nectar thick liquid and tsp's puree with no s/s aspiration; however, noted likely pharyngeal residue and decreased pharyngeal strength due to observation of multiple swallows with all consistencies tested. Noted s/s aspiration c/b immediate coughing following small bite of solid. Recommend Dysphagia I diet with Nectar Thick liquids by TSP only, meds to be crushed in puree, strict aspiration precautions. SLP to f/u with toleration of diet and provide trials of upgraded consistency as appropriate.  SLP Visit Diagnosis: Dysphagia, oropharyngeal phase (R13.12)    Aspiration Risk  Mild aspiration risk;Moderate aspiration risk    Diet Recommendation Dysphagia 1 (Puree);Nectar-thick liquid   Liquid Administration via: Spoon Medication Administration: Crushed with puree Supervision: Full supervision/cueing for compensatory strategies Compensations: Minimize environmental distractions;Slow rate;Small sips/bites;Multiple dry swallows after each bite/sip;Follow solids with liquid Postural Changes: Seated upright at 90 degrees;Remain upright for at least 30 minutes after po intake    Other  Recommendations Oral Care Recommendations: Oral care BID   Follow up Recommendations        Frequency and Duration min 3x week  2 weeks       Prognosis Prognosis for Safe Diet  Advancement: Guarded Barriers to Reach Goals: Severity of deficits      Swallow Study   General Date of Onset: 12/27/17 HPI: Pt is a 77 y.o. female who was seen in ED for abdominal pain.   Symptoms were first noted 2 days ago. Pain is sharp, non-radiating, focal to abdomen.  Associated with nausea, exacerbated by palpation.  Pain continued to worsen so was brought by ambulance to ED.  Workup showed possible perforated diverticulitis so surgery consulted. She was also noted to be in Afib with RVR so was started on diltiazem drip.  She was admitted w/ dx of Diverticulitis with contained bowel perforation, sepsis, SBO.  Pt underwent suegical intervention and was orally intubated 8/1-01/08/2018,  Since then, mental status has improved a bit.  She has been encephalopathic and also given Ativan and morphine.  Currently, she was awake and saying few words but exhibiting decreased awareness oveall.  Type of Study: Bedside Swallow Evaluation Diet Prior to this Study: NPO Temperature Spikes Noted: No Respiratory Status: Nasal cannula History of Recent Intubation: Yes Length of Intubations (days): 4 days Date extubated: 01/08/18 Behavior/Cognition: Alert;Cooperative;Pleasant mood Oral Cavity Assessment: Within Functional Limits Oral Cavity - Dentition: Adequate natural dentition Self-Feeding Abilities: Total assist Patient Positioning: Upright in bed Baseline Vocal Quality: Normal Volitional Cough: Strong Volitional Swallow: Able to elicit    Oral/Motor/Sensory Function Overall Oral Motor/Sensory Function: Within functional limits   Ice Chips Ice chips: Within functional limits Presentation: Spoon   Thin Liquid Thin Liquid: Not tested    Nectar Thick Nectar Thick Liquid: Impaired Presentation: Spoon Pharyngeal Phase Impairments: Decreased hyoid-laryngeal movement;Multiple swallows Other Comments: Mild delayed trigger of swallow initiation, demonstrated adequate awarenes of bolus   Honey Thick Honey Thick Liquid: Not tested   Puree Puree: Impaired Oral Phase Impairments: (mild) Oral Phase Functional Implications: Prolonged oral transit(mild) Pharyngeal Phase Impairments: Decreased  hyoid-laryngeal movement;Multiple swallows   Solid     Solid: Impaired Presentation: Spoon Pharyngeal Phase Impairments: Decreased hyoid-laryngeal movement;Multiple swallows;Cough - Immediate      Rosselyn Martha, MA, CCC-SLP 01/16/2018,11:54 AM

## 2018-01-16 NOTE — Care Management Important Message (Signed)
Copy of signed IM left with patient in room.  

## 2018-01-16 NOTE — Progress Notes (Signed)
Palliative:  I met again with Jody [name] and wife, Jody [name]. She has been cleared for diet and husband has been able to get her to eat a few bites. He understands that this is not adequate but will continue to work with her. We discussed small frequent meals to try and increase overall intake as she will be unable to eat large meals. He shares that her favorite food is peanut butter and I encouraged this is the perfect snack with protein if allowed on her diet (perhaps mixed with magic cup?). Goal is still to get her back home eventually. He is hopeful this is in an improved state and she does seem a little improved. This will be a long road for them at best.   Family has set some limitations with no desire for feeding tube or dialysis. I feel that if it came to it they would also be open to hospice if this is how we would be able to get her home (have not specifically discussed hospice). For now still cautiously hopeful for continued gradual improvement. Emotional support provided.   Exam: Alert, oriented to person. Pleasant. No distress. No complaints.   20 min  Vinie Sill, NP Palliative Medicine Team Pager # [phone] (M-F 8a-5p) Team Phone # [phone] (Nights/Weekends)

## 2018-01-17 ENCOUNTER — Inpatient Hospital Stay: Payer: Medicare Other

## 2018-01-17 DIAGNOSIS — R630 Anorexia: Secondary | ICD-10-CM

## 2018-01-17 DIAGNOSIS — K631 Perforation of intestine (nontraumatic): Secondary | ICD-10-CM

## 2018-01-17 LAB — GLUCOSE, CAPILLARY
GLUCOSE-CAPILLARY: 134 mg/dL — AB (ref 70–99)
GLUCOSE-CAPILLARY: 131 mg/dL — AB (ref 70–99)
GLUCOSE-CAPILLARY: 125 mg/dL — AB (ref 70–99)
GLUCOSE-CAPILLARY: 121 mg/dL — AB (ref 70–99)
Glucose-Capillary: 124 mg/dL — ABNORMAL HIGH (ref 70–99)
Glucose-Capillary: 130 mg/dL — ABNORMAL HIGH (ref 70–99)

## 2018-01-17 LAB — VITAMIN B12: Vitamin B-12: 833 pg/mL (ref 180–914)

## 2018-01-17 LAB — BASIC METABOLIC PANEL
Anion gap: 7 (ref 5–15)
BUN: 26 mg/dL — AB (ref 8–23)
CALCIUM: 8.4 mg/dL — AB (ref 8.9–10.3)
CO2: 33 mmol/L — ABNORMAL HIGH (ref 22–32)
CREATININE: 0.67 mg/dL (ref 0.44–1.00)
Chloride: 96 mmol/L — ABNORMAL LOW (ref 98–111)
GFR calc Af Amer: >60 mL/min (ref >60)
GLUCOSE: 114 mg/dL — AB (ref 70–99)
Potassium: 3.6 mmol/L (ref 3.5–5.1)
Sodium: 136 mmol/L (ref 135–145)

## 2018-01-17 LAB — PHOSPHORUS: Phosphorus: 3.7 mg/dL (ref 2.5–4.6)

## 2018-01-17 MED ORDER — FAT EMULSION PLANT BASED 20 % IV EMUL
250.0000 mL | INTRAVENOUS | Status: AC
  Administered 2018-01-17: 250 mL via INTRAVENOUS
  Filled 2018-01-17: qty 250

## 2018-01-17 MED ORDER — TRACE MINERALS CR-CU-MN-SE-ZN 10-1000-500-60 MCG/ML IV SOLN
INTRAVENOUS | Status: AC
  Administered 2018-01-17: 17:00:00 via INTRAVENOUS
  Filled 2018-01-17: qty 960

## 2018-01-17 MED ORDER — RISPERIDONE 0.5 MG PO TABS
0.5000 mg | ORAL_TABLET | Freq: Every day | ORAL | Status: DC
Start: 2018-01-17 — End: 2018-01-20
  Administered 2018-01-17 – 2018-01-19 (×3): 0.5 mg via ORAL
  Filled 2018-01-17 (×3): qty 1

## 2018-01-17 MED ORDER — NEPRO/CARBSTEADY PO LIQD
237.0000 mL | Freq: Two times a day (BID) | ORAL | Status: DC
  Administered 2018-01-17 – 2018-01-20 (×6): 237 mL via ORAL

## 2018-01-17 MED ORDER — MIRTAZAPINE 15 MG PO TABS
15.0000 mg | ORAL_TABLET | Freq: Every day | ORAL | Status: DC
  Administered 2018-01-17 – 2018-01-20 (×4): 15 mg via ORAL
  Filled 2018-01-17 (×4): qty 1

## 2018-01-17 MED ORDER — MEGESTROL ACETATE 400 MG/10ML PO SUSP
300.0000 mg | Freq: Two times a day (BID) | ORAL | Status: DC
  Administered 2018-01-17 – 2018-01-22 (×11): 300 mg via ORAL
  Filled 2018-01-17 (×13): qty 10

## 2018-01-17 NOTE — Evaluation (Signed)
Occupational Therapy Evaluation Patient Details Name: Jody [name] MRN: [id number] DOB: Apr 11, 1941 Today's Date: 01/17/2018    History of Present Illness Pt is a 77 y.o. female presenting to hospital 12/27/17 d/t concerns of abdominal pain, nausea, and emesis; imaging showing perforated bowel (secondary diverticulitis).  Pt also with acute toxic metabolic encephalopathy, acute respiratory failure with hypoxia, a-fib with RVR, and acute anasarca.  Pt initially evaluated by PT 01/04/18.  S/p exploratory laparatomy, lysis of adhesions, abdominal washout, splenic flexure takedown and L colon resection with colostomy 01/05/18.  No new PT order received post op (pt intubated initially and therapy services discontinued).  Extubated 01/07/18.  New PT order received 01/17/18.  PMH includes moderate sized hiatal hernia, a-fib, lung CA, htn, anxiety, depression.   Clinical Impression   Met with pt lying supine in bed, husband present at bedside. Recent ostomy in place. Pt cognition changed from baseline, oriented to self currently. Husband providing majority of information this date. Delirium prevention encouraged in regard to environment of room/lighting in accordance to time of day. Supine <> sit completed with mod-max A with max verbal and tactile cueing to "walk" legs off of bed and to use handrails to pull up. Pt needing assist to pull to sit. Trunk exercises completed (lateral, forward, back) x3 while seated EOB. Sitting balance tested, noted poor with pt fatiguing after 2-3 minutes. Most of generalized weakness noted in B shoulders while completing EOB tasks. Pt difficulty reaching across midline 2/2 to trunk and BUE weakness. Pt will benefit from SNF level of care at d/c to increase functional independence in BADL.     Follow Up Recommendations  SNF    Equipment Recommendations  None recommended by OT    Recommendations for Other Services       Precautions / Restrictions Precautions Precautions:  Fall Precaution Comments: ostomy LUQ; TPN; abdominal wound/incision Restrictions Weight Bearing Restrictions: No      Mobility Bed Mobility Overal bed mobility: Needs Assistance Bed Mobility: Supine to Sit;Sit to Supine     Supine to sit: Mod assist;HOB elevated;Max assist Sit to supine: Mod assist;Max assist;+2 for safety/equipment   General bed mobility comments: mod-max A for supine <> sit, max verbal and tactile cues needed to "walk legs off bed" and A to pull to sitting (cues to use railings)  Transfers                      Balance Overall balance assessment: Needs assistance Sitting-balance support: Feet supported;No upper extremity supported Sitting balance-Leahy Scale: Poor Sitting balance - Comments: diminished trunk control noted                                   ADL either performed or assessed with clinical judgement   ADL Overall ADL's : Needs assistance/impaired Eating/Feeding: Maximal assistance;Total assistance Eating/Feeding Details (indicate cue type and reason): pt with TPN, trialing nectar thick with supervision Grooming: Minimal assistance;Cueing for sequencing;Bed level   Upper Body Bathing: Moderate assistance;Bed level;Cueing for sequencing   Lower Body Bathing: Total assistance;Bed level;Cueing for sequencing   Upper Body Dressing : Moderate assistance;Cueing for sequencing;Sitting   Lower Body Dressing: Total assistance;Sit to/from stand;Cueing for sequencing   Toilet Transfer: Total assistance Toilet Transfer Details (indicate cue type and reason): purewick in place, new ostomy Toileting- Clothing Manipulation and Hygiene: Total assistance Toileting - Clothing Manipulation Details (indicate cue type and reason): for ostomy management  and purewick management             Vision Baseline Vision/History: Wears glasses Wears Glasses: At all times Patient Visual Report: No change from baseline       Perception      Praxis      Pertinent Vitals/Pain Pain Assessment: Faces Faces Pain Scale: No hurt Pain Intervention(s): Limited activity within patient's tolerance;Monitored during session;Repositioned     Hand Dominance     Extremity/Trunk Assessment Upper Extremity Assessment Upper Extremity Assessment: Generalized weakness(grossly 3/5, most noted at B shoulders)   Lower Extremity Assessment Lower Extremity Assessment: Generalized weakness       Communication Communication Communication: No difficulties   Cognition Arousal/Alertness: Awake/alert Behavior During Therapy: Flat affect Overall Cognitive Status: Impaired/Different from baseline Area of Impairment: Orientation;Memory;Following commands;Problem solving;Awareness                 Orientation Level: Person   Memory: Decreased short-term memory Following Commands: Follows multi-step commands inconsistently;Follows multi-step commands with increased time     Problem Solving: Slow processing;Requires tactile cues;Requires verbal cues;Difficulty sequencing General Comments: Pt slow to process commands, needing one step commands, often repeats outloud to ensure follow through correctly   General Comments       Exercises     Shoulder Instructions      Home Living Family/patient expects to be discharged to:: Private residence Living Arrangements: Spouse/significant other Available Help at Discharge: Family Type of Home: House Home Access: Stairs to enter [employer] of Steps: 2 steps no railing garage to mud room plus 4 steps with L railing mudroom to living area   Home Layout: One level     Bathroom Shower/Tub: Tub/shower unit;Walk-in shower   Bathroom Toilet: Standard     Home Equipment: Grab bars - tub/shower;Shower seat - built in   Additional Comments: Pt needing occasional A from husband to answer questions appropriately       Prior Functioning/Environment Level of Independence:  Independent        Comments: Pt and husband report independence in all ADL/IADL prior to admission        OT Problem List: Decreased strength;Impaired balance (sitting and/or standing);Decreased cognition;Decreased activity tolerance;Decreased knowledge of use of DME or AE      OT Treatment/Interventions: Self-care/ADL training;DME and/or AE instruction;Balance training;Therapeutic exercise;Patient/family education    OT Goals(Current goals can be found in the care plan section) Acute Rehab OT Goals Patient Stated Goal: to get stronger and sit on side of bed today OT Goal Formulation: With patient Time For Goal Achievement: 01/31/18 Potential to Achieve Goals: Good  OT Frequency: Min 3X/week   Barriers to D/C:            Co-evaluation              AM-PAC PT "6 Clicks" Daily Activity     Outcome Measure Help from another person eating meals?: Total Help from another person taking care of personal grooming?: A Little Help from another person toileting, which includes using toliet, bedpan, or urinal?: Total Help from another person bathing (including washing, rinsing, drying)?: A Lot Help from another person to put on and taking off regular upper body clothing?: A Little Help from another person to put on and taking off regular lower body clothing?: A Lot 6 Click Score: 12   End of Session    Activity Tolerance: Patient tolerated treatment well Patient left: in bed;with call bell/phone within reach;with bed alarm set;with family/visitor present  OT Visit  Diagnosis: Muscle weakness (generalized) (M62.81);Other abnormalities of gait and mobility (R26.89)                Time: 1636-1700 OT Time Calculation (min): 24 min Charges:  OT General Charges $OT Visit: 1 Visit OT Evaluation $OT Eval High Complexity: 1 High OT Treatments $Self Care/Home Management : 8-22 mins  Zenovia Jarred, MSOT, OTR/L  [place] 01/17/2018, 5:36 PM

## 2018-01-17 NOTE — Progress Notes (Signed)
PHARMACY - ADULT TOTAL PARENTERAL NUTRITION CONSULT NOTE   Pharmacy Consult for TPN Management  Indication: colon perforation, NPO Status   Patient Measurements: Height: 4\' 11"  (149.9 cm) Weight: 163 lb 14.4 oz (74.3 kg) IBW/kg (Calculated) : 43.2 TPN AdjBW (KG): 50.5 Body mass index is 33.1 kg/m.   Assessment:  Pharmacy consulted for TPN management for 77 yo female with diffuse inflammation on distal jejunum extending to sigmoid colon with perforation.   Patient being diuresed with furosemide 40 mg IV BID due to volume overload. TPN rate was also reduced on 01/11/18.   Palliative care consult was placed 01/12/18 by hospitalist.   8/12: K 3.3, Ca 7.9, adjusted Ca 9.2, Phos 3.4, Mag 2.1  TPN Access: PICC Triple Lumen  TPN start date: 12/30/17 Nutritional Goals (per RD recommendation on 7/26): KCal: 1500-1700 kcal  Protein: 87-102 grams  Fluid: > 1.5 L Goal TPN rate is 83 ml/hr  Current Nutrition:   Plan:  Bowel function returned over weekend, leukocytosis resolved. Patient has declined feeding tube, but advanced to dysphagia 1 diet per SLP. Per RD we will reduce TPN to half goal -  Clinimix E 5/15 at 40 mL/hr. ILE 20% at 69mL/hr x12h.  Continue MVI, trace elements in TPN  KCl 10 mEq IV Q2H x 4 doses was given yesterday, K 3.6 today. Continue to follow per TPN protocol.  Continue SSI moderate Q4hr. BG 124-124.  Monitor TPN labs, blood glucose.  Pharmacy will continue to monitor and adjust per consult.    Laural Benes, Pharm.D., BCPS Clinical Pharmacist 01/17/2018 1:09 PM

## 2018-01-17 NOTE — Plan of Care (Signed)
Patient is communicating with spouse and staff. Patient is repositioning herself in the bed. Patient is able to sit up by herself.

## 2018-01-17 NOTE — Progress Notes (Signed)
Subjective:    Jody [name] is a 77 y.o. female  Hospital stay day 21, 12 Days Post-Op Exlap, abdominal washout, hartmans.  Return of bowel function noted.   Mentation remains stable.  Speech recommended thick liquid diet with encouragement.  Husband confirmed patient able to take in crushed pills mixed in apple sauce with encouragement  Objective:      Temp:  [97.5 F (36.4 C)-98.4 F (36.9 C)] 97.7 F (36.5 C) (08/13 0735) Pulse Rate:  [66-100] 66 (08/13 0801) Resp:  [18] 18 (08/13 0413) BP: (98-126)/(52-65) 98/64 (08/13 0801) SpO2:  [94 %-99 %] 94 % (08/13 0735) Weight:  [74.3 kg] 74.3 kg (08/13 0418)     Height: 4\' 11"  (149.9 cm) Weight: 74.3 kg BMI (Calculated): 33.09   Intake/Output this shift:  No intake/output data recorded.       Constitutional :  cooperative and appears stated age  Respiratory:  clear to auscultation bilaterally  Cardiovascular:  regularly irregular rhythm  Gastrointestinal: soft, non-tender; bowel sounds normal; no masses,  no organomegaly and ostomy with liquid stool, midline incision has opened down to subcutaneous fat about 3cm wide toward inferior poriton.  no fascial dehisicience palpable..   Skin: Cool and moist.   Psychiatric: Non-agitated, but confused, little better today at responding to questions       LABS:  CMP Latest Ref Rng & Units 01/17/2018 01/16/2018 01/15/2018  Glucose 70 - 99 mg/dL 114(H) 160(H) 175(H)  BUN 8 - 23 mg/dL 26(H) 25(H) 25(H)  Creatinine 0.44 - 1.00 mg/dL 0.67 0.64 0.53  Sodium 135 - 145 mmol/L 136 135 136  Potassium 3.5 - 5.1 mmol/L 3.6 3.3(L) 3.6  Chloride 98 - 111 mmol/L 96(L) 95(L) 95(L)  CO2 22 - 32 mmol/L 33(H) 32 33(H)  Calcium 8.9 - 10.3 mg/dL 8.4(L) 7.9(L) 7.7(L)  Total Protein 6.5 - 8.1 g/dL - 5.3(L) -  Total Bilirubin 0.3 - 1.2 mg/dL - 0.5 -  Alkaline Phos 38 - 126 U/L - 160(H) -  AST 15 - 41 U/L - 61(H) -  ALT 0 - 44 U/L - 50(H) -   CBC Latest Ref Rng & Units 01/14/2018 01/13/2018 01/12/2018  WBC 3.6  - 11.0 K/uL 8.0 11.8(H) 13.2(H)  Hemoglobin 12.0 - 16.0 g/dL 9.4(L) 9.9(L) 9.5(L)  Hematocrit 35.0 - 47.0 % 27.1(L) 29.4(L) 27.9(L)  Platelets 150 - 440 K/uL 358 417 385    RADS: n/a Assessment:   Exlap, abdominal washout, hartmans.  Return of bowel function noted. Ok to feed per speech recommendations as tolerated.  Recommend continuing to stay away from any anxiolytics, narcotic meds to see if any improvement in mentation.  No acute issues as cause noted.    Continue wet-to-dry dressing to midline wound until healed.  No further intervention needed.  Fascia is intact.  Recommend Possible check B12 level to see if chronically low, as reports have noted low B12 level in the perioperative period in elderly can increase risk of encephalopathy.  Ok to d/c from hospital from surgical standpoint.  followup in 4mo for discussion of possible ostomy reversal, although I have great hesitation at this point due to her encephalopathy.  Further surgical intervention can potentially exacerbate encephalopathy further.  Will recommend avoiding any feeding tube procedures for this reason well at this point.  Surgery will sign off.  Please call with additional concerns

## 2018-01-17 NOTE — Plan of Care (Signed)
Patient is repositioning in bed with little assistance from staff. No complaints of pain. Patient remains in Afib heart rate is stable.

## 2018-01-17 NOTE — Progress Notes (Signed)
Palliative:  I met again today at Jody [name]'s bedside with her husband. She is sleeping and does arouse briefly. She does not look good today. Question if she is having some sleep apnea?? No history per husband.   I spent some time discussing my concern with husband regarding her progression and poor intake. He knows she cannot receive TPN indefinitely and I questioned if she continues to NOT improve if he has thought about what this would mean. He tells me that they do have long term care insurance but is hoping not to use this. He also talks of her needing rehab and I explain that she is even a long way from being able to do rehab and that her nutritional needs will need to be figured out in the meantime. We discuss feeding tube as he has previously said that Jody [name] would not want this but now he is not so sure. We spoke briefly of feeding tube but more about QOL and the benefit vs consequences of artificial feeding is more important. He seems to understand but is unsure of his thoughts and wishes for his wife on this subject. He says he needs to get her Living Will to review (I think he has avoided doing this) and he has not really left his wife's bedside much. I also provided him with Hard Choices booklet to review. Emotional support provided.   [name] is struggling with his wife's poor progression and prognosis. He continues to be hopeful that she will improve and is not really thinking past rehab and long term care to end of life at this point. They will need continued emotional support.   Also added Remeron 15 mg qhs to assist with nighttime agitation and perhaps appetite after discussion with husband.   I will not be rounding 01/18/18 but will follow up 8/65/78.   50 min  Vinie Sill, NP Palliative Medicine Team Pager # [phone] (M-F 8a-5p) Team Phone # [phone] (Nights/Weekends)

## 2018-01-17 NOTE — Evaluation (Signed)
Physical Therapy Re-Evaluation Patient Details Name: Jody [name] MRN: [id number] DOB: 04-07-1941 Today's Date: 01/17/2018   History of Present Illness  Pt is a 77 y.o. female presenting to hospital 12/27/17 d/t concerns of abdominal pain, nausea, and emesis; imaging showing perforated bowel (secondary diverticulitis).  Pt also with acute toxic metabolic encephalopathy, acute respiratory failure with hypoxia, a-fib with RVR, and acute anasarca.  Pt initially evaluated by PT 01/04/18.  S/p exploratory laparatomy, lysis of adhesions, abdominal washout, splenic flexure takedown and L colon resection with colostomy 01/05/18.  No new PT order received post op (pt intubated initially and therapy services discontinued).  Extubated 01/07/18.  New PT order received 01/17/18.  PMH includes moderate sized hiatal hernia, a-fib, lung CA, htn, anxiety, depression.  Clinical Impression  New PT consult received and PT re-evaluation performed.  Prior to hospital admission, pt was independent with ambulation/functional mobility.  Pt lives with her husband in 1 level home with steps to enter.  Currently pt is 2 assist with bed mobility and min to mod assist x2 to stand with RW (x3 trials; pt able to stand for 5-10 seconds at most d/t B LE weakness/fatigue).  Overall pt demonstrating generalized weakness, deconditioning, and impaired activity tolerance from extended hospitalization/medical issues.  Pt would benefit from skilled PT to address noted impairments and functional limitations (see below for any additional details).  Upon hospital discharge, recommend pt discharge to [facility].    Follow Up Recommendations SNF    Equipment Recommendations  Rolling walker with 5" wheels    Recommendations for Other Services OT consult     Precautions / Restrictions Precautions Precautions: Fall Precaution Comments: ostomy LUQ; TPN; abdominal wound/incision Restrictions Weight Bearing Restrictions: No      Mobility  Bed  Mobility Overal bed mobility: Needs Assistance Bed Mobility: Supine to Sit;Sit to Supine     Supine to sit: Mod assist;Max assist;+2 for physical assistance;HOB elevated Sit to supine: Mod assist;Max assist;+2 for physical assistance;HOB elevated   General bed mobility comments: assist for trunk and B LE's semi-supine to/from sit with HOB elevated and 2 assist to boost pt up in bed; vc's and tactile cues to assist with LE's and UE's  Transfers Overall transfer level: Needs assistance Equipment used: Rolling walker (2 wheeled) Transfers: Sit to/from Stand Sit to Stand: Min assist;Mod assist;+2 physical assistance         General transfer comment: 1st trial pt min to mod assist x2 to stand with RW; 2nd and 3rd trial pt min assist x2 to stand with RW; vc's and tactile cues for UE and LE placement required as well as upright posture once standing  Ambulation/Gait             General Gait Details: Deferred d/t pt only able to stand for 5-10 seconds at most each trial d/t LE weakness/fatigue  Stairs            Wheelchair Mobility    Modified Rankin (Stroke Patients Only)       Balance Overall balance assessment: Needs assistance Sitting-balance support: Bilateral upper extremity supported;Feet supported Sitting balance-Leahy Scale: Poor Sitting balance - Comments: pt intermittently leaning forward requiring cueing and occasional min assist to correct   Standing balance support: Bilateral upper extremity supported Standing balance-Leahy Scale: Poor Standing balance comment: requires B UE support on RW and CGA x2 for safety standing  Pertinent Vitals/Pain Pain Assessment: Faces Faces Pain Scale: No hurt Pain Intervention(s): Limited activity within patient's tolerance;Monitored during session;Repositioned  Vitals stable and WFL throughout treatment session.  Pt on room air upon PT arrival and O2 sats 92% or greater throughout  therapy session.  BP 111/82 at rest beginning of session and 112/69 end of session resting in bed.  Nursing notified regarding vitals.    Home Living Family/patient expects to be discharged to:: Private residence Living Arrangements: Spouse/significant other Available Help at Discharge: Family Type of Home: House Home Access: Stairs to enter   [employer] of Steps: 2 steps no railing garage to mud room plus 4 steps with L railing mudroom to living area Home Layout: One level [place];Shower seat - built in      Prior Function Level of Independence: Independent         Comments: No h/o falls in past 6 months.  Independent with functional mobility prior to hospital admission.  Was max assist x2 supine to/from sit with physical therapy eval 01/04/18.     Hand Dominance        Extremity/Trunk Assessment   Upper Extremity Assessment Upper Extremity Assessment: Defer to OT evaluation(AROM B shoulder flexion at least 3/5; L elbow flexion at least 3/5 (deferred R d/t IV site); good B hand grip)    Lower Extremity Assessment Lower Extremity Assessment: Generalized weakness(B DF 4/5; B knee extension/flexion 3+/5; B hip flexion 3-/5)    Cervical / Trunk Assessment Cervical / Trunk Assessment: (forward posture noted)  Communication   Communication: (Difficult to understand pt intermittently)  Cognition Arousal/Alertness: Awake/alert Behavior During Therapy: Flat affect(Occasionally smiling during session) Overall Cognitive Status: Impaired/Different from baseline Area of Impairment: Orientation;Memory;Following commands;Problem solving                 Orientation Level: (Able to state last name with increased time; did not know place (hospital) but able to [facility]; stated it was April 1940; not oriented to situation)   Memory: Decreased short-term memory Following Commands: Follows multi-step commands inconsistently;Follows  multi-step commands with increased time     Problem Solving: Slow processing;Requires tactile cues;Requires verbal cues        General Comments General comments (skin integrity, edema, etc.): ostomy intact (no leaking noted) beginning and end of session; abdominal incision and dressing intact beginning/end of session.  Nursing cleared pt for participation in physical therapy (discussed bedrest with bathroom privileges order with nurse who cleared pt to participate in PT and to get up to chair).  Pt agreeable to PT session.  Pt's husband present during session.    Exercises  Transfer and bed mobility training; sitting tolerance EOB x15 minutes   Assessment/Plan    PT Assessment Patient needs continued PT services  PT Problem List Decreased strength;Decreased activity tolerance;Decreased balance;Decreased mobility;Decreased cognition;Decreased knowledge of use of DME;Decreased safety awareness;Decreased knowledge of precautions;Decreased skin integrity;Pain       PT Treatment Interventions DME instruction;Gait training;Stair training;Functional mobility training;Therapeutic activities;Therapeutic exercise;Balance training;Patient/family education    PT Goals (Current goals can be found in the Care Plan section)  Acute Rehab PT Goals Patient Stated Goal: to get stronger and be able to walk again PT Goal Formulation: With patient/family Time For Goal Achievement: 01/31/18 Potential to Achieve Goals: Good    Frequency Min 2X/week   Barriers to discharge Decreased caregiver support      Co-evaluation  AM-PAC PT "6 Clicks" Daily Activity  Outcome Measure Difficulty turning over in bed (including adjusting bedclothes, sheets and blankets)?: Unable Difficulty moving from lying on back to sitting on the side of the bed? : Unable Difficulty sitting down on and standing up from a chair with arms (e.g., wheelchair, bedside commode, etc,.)?: Unable Help needed moving to  and from a bed to chair (including a wheelchair)?: Total Help needed walking in hospital room?: Total Help needed climbing 3-5 steps with a railing? : Total 6 Click Score: 6    End of Session Equipment Utilized During Treatment: (gait belt up high off of incision/ostomy) Activity Tolerance: Patient limited by fatigue Patient left: in bed;with call bell/phone within reach;with bed alarm set;with nursing/sitter in room;with family/visitor present;with SCD's reapplied;Other (comment)(B heels elevated via pillow) Nurse Communication: Mobility status;Precautions PT Visit Diagnosis: Other abnormalities of gait and mobility (R26.89);Muscle weakness (generalized) (M62.81);Difficulty in walking, not elsewhere classified (R26.2)    Time: 8270-7867 PT Time Calculation (min) (ACUTE ONLY): 54 min   Charges:   PT Evaluation $PT Re-evaluation: 1 Re-eval PT Treatments $Therapeutic Exercise: 8-22 mins $Therapeutic Activity: 8-22 mins       Leitha Bleak, PT 01/17/18, 1:22 PM [phone]

## 2018-01-17 NOTE — Progress Notes (Signed)
Nutrition Follow-up  DOCUMENTATION CODES:   Not applicable  INTERVENTION:   Reduce Clinimix E 5/15 to 40 mL/hr   Continue 20% ILE at 20 mL/hr over 12 hours   Continue adult MVI and trace elements as daily TPN additives.  Continue measuring daily weights.  Add Nepro Shake po BID, each supplement provides 425 kcal and 19 grams protein  Add Vital Cuisine TID, each supplement provides 520kcal and 22g of protein.   Add Magic cup TID with meals, each supplement provides 290 kcal and 9 grams of protein  Add Megace 366m po BID  NUTRITION DIAGNOSIS:   Inadequate oral intake related to acute illness(colon perf) as evidenced by per patient/family report, NPO status.  Ongoing inadequate intake - addressing with TPN  GOAL:   Patient will meet greater than or equal to 90% of their needs  Met with TPN.  MONITOR:   PO intake, Supplement acceptance, Labs, Weight trends, Skin, I & O's, Other (Comment)(TPN)  ASSESSMENT:   Patient with PMH of A-fib, Lung Cancer, Asthma, HTN, and borderline DM presents with abdominal pain. On CT she was noted to have diffuse inflammation of the distal jejenum extending to the descending/sigmoid colon with perforation which may be secondary to diverticulitis.    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 -Patient was extubated on 8/3.  Pt seen by SLP and initiated on a dysphagia 1/nectar thick diet. Pt eating only bites of meals but is apple to takes some meds with applesauce. Palliative care following and notes that pt does not desire a feeding tube. Pt ok to discharge per surgery standpoint and has signed off. Spoke to MD, will plan to reduce TPN rate tonight and add supplements. Will also initiate megace to try and encourage po intake. Pt's weights have returned back down to ~4lbs over pt's admit weight. Pt -9.3L on  her I & Os. RD will continue to monitor intakes.    Medications reviewed and include: eliquis, lasix, insulin   Labs reviewed: Cl 96(L), BUN 26(H), P 3.7 wnl Mg 2.1 wnl- 8/12 Triglycerides 119- 8/12  Hgb 9.4(L), 27.1(L) cbgs- 130, 134, 131 x 24 hrs  Diet Order:   Diet Order            DIET - DYS 1 Room service appropriate? Yes; Fluid consistency: Nectar Thick  Diet effective now             EDUCATION NEEDS:   Not appropriate for education at this time  Skin: Reviewed RN Assessment(incision abdomen )  Last BM:  8/12- 2567m Height:   Ht Readings from Last 1 Encounters:  12/27/17 4' 11"  (1.499 m)    Weight:   Wt Readings from Last 1 Encounters:  01/17/18 74.3 kg    Ideal Body Weight:  44.69 kg  BMI:  Body mass index is 33.1 kg/m.  Estimated Nutritional Needs:   Kcal:  1815-2180 (25-30 kcal/kg)  Protein:  87-102 grams (1.2-1.4g/kg)  Fluid:  per MD  CaKoleen DistanceS, RD, LDN Pager #- [phone]#- [phone] Hours Pager: [phone]

## 2018-01-17 NOTE — Progress Notes (Signed)
[place] [facility] [place] NAME: Jody [name]    MR#:  [id number]  DATE OF BIRTH:  1940/10/24  SUBJECTIVE:  CHIEF COMPLAINT:   Chief Complaint  Patient presents with  . Abdominal Pain  . Emesis  . Atrial Fibrillation   -Confused last night and received Ativan-sleepy this morning. -Started on thickened liquid diet, poor oral intake  REVIEW OF SYSTEMS:  Review of Systems  Constitutional: Positive for malaise/fatigue and weight loss. Negative for chills and fever.  HENT: Negative for congestion, ear discharge, hearing loss and nosebleeds.   Eyes: Negative for blurred vision and double vision.  Respiratory: Negative for cough, shortness of breath and wheezing.   Cardiovascular: Negative for chest pain and palpitations.  Gastrointestinal: Positive for abdominal pain. Negative for constipation, diarrhea, nausea and vomiting.  Genitourinary: Negative for dysuria.  Musculoskeletal: Negative for myalgias.  Neurological: Negative for dizziness, speech change, focal weakness, seizures, weakness and headaches.  Psychiatric/Behavioral: Negative for depression.    DRUG ALLERGIES:   Allergies  Allergen Reactions  . Poison Oak Extract Other (See Comments)    blistering  . Ampicillin Rash  . Penicillin V Potassium Rash    Has patient had a PCN reaction causing immediate rash, facial/tongue/throat swelling, SOB or lightheadedness with hypotension: No Has patient had a PCN reaction causing severe rash involving mucus membranes or skin necrosis: No Has patient had a PCN reaction that required hospitalization: No Has patient had a PCN reaction occurring within the last 10 years: No If all of the above answers are "NO", then may proceed with Cephalosporin use.     VITALS:  Blood pressure 98/64, pulse 66, temperature 97.7 F (36.5 C), temperature source Oral, resp. rate 18, height 4\' 11"  (1.499 m), weight 74.3 kg, SpO2 94 %.  PHYSICAL EXAMINATION:    Physical Exam  GENERAL:  77 y.o.-year-old patient lying in the bed with no acute distress.  EYES: Pupils equal, round, reactive to light and accommodation. No scleral icterus. Extraocular muscles intact.  HEENT: Head atraumatic, normocephalic. Oropharynx and nasopharynx clear.  NECK:  Supple, no jugular venous distention. No thyroid enlargement, no tenderness.  LUNGS: Normal breath sounds bilaterally, no wheezing,  rhonchi or crepitation. No use of accessory muscles of respiration.  Bibasilar coarse Rales CARDIOVASCULAR: S1, S2 normal. No  rubs, or gallops.  2/6 systolic murmur is present ABDOMEN: Soft, nontender, nondistended.  Left lower quadrant colostomy bag in place.  Bowel sounds present. No organomegaly or mass.  EXTREMITIES: No pedal edema, cyanosis, or clubbing.  NEUROLOGIC: Cranial nerves II through XII are intact. Muscle strength 5/5 in all extremities. Sensation intact. Gait not checked.  Global weakness noted.  Following simple commands PSYCHIATRIC: The patient is alert, oriented to self SKIN: No obvious rash, lesion, or ulcer.    LABORATORY PANEL:   CBC Recent Labs  Lab 01/14/18 0512  WBC 8.0  HGB 9.4*  HCT 27.1*  PLT 358   ------------------------------------------------------------------------------------------------------------------  Chemistries  Recent Labs  Lab 01/16/18 0459 01/17/18 0512  NA 135 136  K 3.3* 3.6  CL 95* 96*  CO2 32 33*  GLUCOSE 160* 114*  BUN 25* 26*  CREATININE 0.64 0.67  CALCIUM 7.9* 8.4*  MG 2.1  --   AST 61*  --   ALT 50*  --   ALKPHOS 160*  --   BILITOT 0.5  --    ------------------------------------------------------------------------------------------------------------------  Cardiac Enzymes No results for input(s): TROPONINI in the last 168 hours. ------------------------------------------------------------------------------------------------------------------  RADIOLOGY:  Dg Chest 2 View  Result Date:  01/17/2018 CLINICAL DATA:  CHF EXAM: CHEST - 2 VIEW COMPARISON:  01/10/2018 FINDINGS: Right PICC line remains in place, unchanged. Cardiomegaly with vascular congestion and mild interstitial edema. Bibasilar atelectasis and small layering effusions. No real change since prior study. IMPRESSION: Continued mild interstitial edema/CHF with small effusions and bibasilar atelectasis. No real change. Electronically Signed   By: Rolm Baptise M.D.   On: 01/17/2018 07:15    EKG:   Orders placed or performed during the hospital encounter of 12/27/17  . EKG 12-Lead  . EKG 12-Lead  . ED EKG within 10 minutes  . ED EKG within 10 minutes    ASSESSMENT AND PLAN:   Female with past medical history significant for A. fib on Eliquis, history of lung cancer, hypertension and diabetes brought to hospital secondary to abdominal pain and noted to have perforated bowels  1.  Perforated bowels-secondary to diverticulitis.  Appreciate surgical input. -Patient is status post laparotomy, left colon resection, abdominal washout and colostomy placement. -Poor oral intake, on TPN -Speech therapy consulted and started on thickened liquid diet.  Megace has been added.  Appreciate dietitian consult as well.  Plans to slowly taper off TPN.  Not a candidate for PEG tube placement and patient and family not interested in PEG tube.  2.  Acute systolic CHF with EF of 40 to 45%.  Also has diastolic dysfunction. -Continue IV Lasix for 1 more day.  Not on any antibiotics at this time.  Chest x-ray showing pulmonary vascular congestion mild and also bibasilar atelectasis  3.  A. fib with RVR-  on oral amiodarone.  Also on digoxin, Cardizem and metoprolol. -Adjust medications if blood pressure is stable -on  Eliquis for anticoagulation  4.  Hypoalbuminemia-being replaced  5.  DVT prophylaxis-already on Eliquis  6.  Acute delirium-we will start on risperidone at bedtime.  Avoid Ativan if possible.   Physical therapy  consulted Husband updated at bedside    All the records are reviewed and case discussed with Care Management/Social Workerr. Management plans discussed with the patient, family and they are in agreement.  CODE STATUS: DNR  TOTAL TIME TAKING CARE OF THIS PATIENT: 37 minutes.   POSSIBLE D/C IN 2-3 DAYS, DEPENDING ON CLINICAL CONDITION.   Gladstone Lighter M.D on 01/17/2018 at 1:59 PM  Between 7am to 6pm - Pager - [phone]  After 6pm go to [email or web address] - password [email or web address] [facility]  Office  [phone]  CC: Primary care physician; Baxter Hire, MD

## 2018-01-18 LAB — POTASSIUM: POTASSIUM: 3.2 mmol/L — AB (ref 3.5–5.1)

## 2018-01-18 LAB — GLUCOSE, CAPILLARY
GLUCOSE-CAPILLARY: 116 mg/dL — AB (ref 70–99)
GLUCOSE-CAPILLARY: 99 mg/dL (ref 70–99)
Glucose-Capillary: 106 mg/dL — ABNORMAL HIGH (ref 70–99)
Glucose-Capillary: 134 mg/dL — ABNORMAL HIGH (ref 70–99)
Glucose-Capillary: 152 mg/dL — ABNORMAL HIGH (ref 70–99)
Glucose-Capillary: 87 mg/dL (ref 70–99)

## 2018-01-18 LAB — PHOSPHORUS: PHOSPHORUS: 3.5 mg/dL (ref 2.5–4.6)

## 2018-01-18 LAB — MAGNESIUM: MAGNESIUM: 2 mg/dL (ref 1.7–2.4)

## 2018-01-18 MED ORDER — METOPROLOL SUCCINATE ER 50 MG PO TB24
50.0000 mg | ORAL_TABLET | Freq: Every day | ORAL | Status: DC
  Administered 2018-01-19 – 2018-01-22 (×4): 50 mg via ORAL
  Filled 2018-01-18 (×4): qty 1

## 2018-01-18 MED ORDER — FUROSEMIDE 20 MG PO TABS
20.0000 mg | ORAL_TABLET | Freq: Two times a day (BID) | ORAL | Status: DC
  Administered 2018-01-18 – 2018-01-21 (×8): 20 mg via ORAL
  Filled 2018-01-18 (×9): qty 1

## 2018-01-18 MED ORDER — POTASSIUM CHLORIDE CRYS ER 20 MEQ PO TBCR
40.0000 meq | EXTENDED_RELEASE_TABLET | ORAL | Status: AC
  Administered 2018-01-18 (×2): 40 meq via ORAL
  Filled 2018-01-18 (×2): qty 2

## 2018-01-18 MED ORDER — FLUOXETINE HCL 20 MG PO CAPS
20.0000 mg | ORAL_CAPSULE | Freq: Every day | ORAL | Status: DC
  Administered 2018-01-18 – 2018-01-22 (×5): 20 mg via ORAL
  Filled 2018-01-18 (×5): qty 1

## 2018-01-18 MED ORDER — ADULT MULTIVITAMIN W/MINERALS CH
1.0000 | ORAL_TABLET | Freq: Every day | ORAL | Status: DC
Start: 2018-01-19 — End: 2018-01-22
  Administered 2018-01-19 – 2018-01-22 (×4): 1 via ORAL
  Filled 2018-01-18 (×4): qty 1

## 2018-01-18 NOTE — Progress Notes (Signed)
Dr. Tressia Miners notified of soft BP - order to hold morning lasix. Aware of low potassium - MD to place orders.

## 2018-01-18 NOTE — Progress Notes (Signed)
[facility] for apixaban Indication: atrial fibrillation  Allergies  Allergen Reactions  . Poison Oak Extract Other (See Comments)    blistering  . Ampicillin Rash  . Penicillin V Potassium Rash    Has patient had a PCN reaction causing immediate rash, facial/tongue/throat swelling, SOB or lightheadedness with hypotension: No Has patient had a PCN reaction causing severe rash involving mucus membranes or skin necrosis: No Has patient had a PCN reaction that required hospitalization: No Has patient had a PCN reaction occurring within the last 10 years: No If all of the above answers are "NO", then may proceed with Cephalosporin use.     Patient Measurements: Height: 4\' 11"  (149.9 cm) Weight: 159 lb 9.6 oz (72.4 kg) IBW/kg (Calculated) : 43.2  Vital Signs: Temp: 98.6 F (37 C) (08/14 0745) Temp Source: Oral (08/14 0745) BP: 102/64 (08/14 0745) Pulse Rate: 110 (08/14 0745)  Labs: Recent Labs    01/16/18 0459 01/17/18 0512  CREATININE 0.64 0.67    Estimated Creatinine Clearance: 49.4 mL/min (by C-G formula based on SCr of 0.67 mg/dL).   Assessment: 77 year old female admitted for abdominal pain/emesis/AF. Patient on apixaban at home but was switched to Lovenox while NPO. Patient's PO status has improved and she has been back on apixaban since 8/12.  Goal of Therapy:  Monitor platelets by anticoagulation protocol: Yes   Plan:  Continue apixaban 5 mg PO BID for AF  Pharmacy will continue to follow.  Tawnya Crook, PharmD Pharmacy Resident  01/18/2018 12:45 PM

## 2018-01-18 NOTE — Progress Notes (Signed)
  Speech Language Pathology Treatment: Dysphagia  Patient Details Name: Jody [name] MRN: [id number] DOB: [birth year] Today's Date: 01/18/2018 Time: 1255-1350 SLP Time Calculation (min) (ACUTE ONLY): 55 min  Assessment / Plan / Recommendation Clinical Impression  Pt seen today for ongoing toleration of diet and trials to upgrade foods in her diet. Pt has been eating more of the recommended diet of Dysphagia 1 w/ Nectar liquids. She is more alert and exhibiting more stamina and energy; eager to try more solid foods again. She was verbally responsive to SLP but appeared fatigued - many visitors this morning per report. Pt continues to have TPN; on appetite stimulant.  Pt consumed po trials (4 total) fo soft solids foods w/ no significant oral phase deficits noted; min increased oral phase time for thorough mastication and oral clearing of the soft solids. Given time, oral clearing was achieved. Educated pt and family on the use of alternating foods/liquids; choosing moist foods. Pt also consumed her Nectar consistency liquids w/ no overt s/s of aspiration noted w/ sips (as well as w/ the soft solids). Due to pt's fatigue and decreased interest in solid foods/oral intake, education and information given on food preparation and choices, options during all of the meals w/ reducing the "work load" of the foods for conservation of energy for pt. Encouraged pt to continue to try more bites at the next meal. Dietitian following pt. ST services will f/u w/ trials to upgrade the liquid consistency in diet over next 1-3 days. Pt consumes the Nectar liquids well per NSG report.  Recommend a Dysphagia level 3 (mech soft) w/ Nectar liquids; aspiration precautions; Pills in puree for safer swallowing. NSG updated.     HPI HPI: Pt is a 77 y.o. female who was seen in ED for abdominal pain.  Symptoms were first noted 2 days ago. Pain is sharp, non-radiating, focal to abdomen.  Associated with nausea, exacerbated by  palpation.  Pain continued to worsen so was brought by ambulance to ED.  Workup showed possible perforated diverticulitis so surgery consulted. She was also noted to be in Afib with RVR so was started on diltiazem drip.  She was admitted w/ dx of Diverticulitis with contained bowel perforation, sepsis, SBO.  Pt underwent suegical intervention and was orally intubated 8/1-01/08/2018,  Since then, mental status has improved a bit.  She has been encephalopathic and also given Ativan and morphine.  Currently, she was awake and saying few words but exhibiting decreased awareness oveall.       SLP Plan  Continue with current plan of care       Recommendations  Diet recommendations: Dysphagia 3 (mechanical soft);Nectar-thick liquid Liquids provided via: Cup;No straw Medication Administration: Whole meds with puree(for safer swallowing) Supervision: Patient able to self feed;Staff to assist with self feeding;Intermittent supervision to cue for compensatory strategies Compensations: Minimize environmental distractions;Slow rate;Small sips/bites;Lingual sweep for clearance of pocketing;Multiple dry swallows after each bite/sip;Follow solids with liquid Postural Changes and/or Swallow Maneuvers: Seated upright 90 degrees;Upright 30-60 min after meal                General recommendations: (Dietician following) Oral Care Recommendations: Oral care BID;Staff/trained caregiver to provide oral care Follow up Recommendations: (TBD) SLP Visit Diagnosis: Dysphagia, oropharyngeal phase (R13.12) Plan: Continue with current plan of care       [place], [place], CCC-SLP Johan Antonacci 01/18/2018, 2:35 PM

## 2018-01-18 NOTE — Progress Notes (Signed)
Nutrition Follow-up  DOCUMENTATION CODES:   Not applicable  INTERVENTION:   Discontinue Clinimix E 5/15   Discontinue 20% ILE   Continue measuring daily weights.  Continue Nepro Shake po BID, each supplement provides 425 kcal and 19 grams protein  Continue Vital Cuisine TID, each supplement provides 520kcal and 22g of protein.   Continue Magic cup TID with meals, each supplement provides 290 kcal and 9 grams of protein  Continue Megace 374m po BID  Add MVI daily   NUTRITION DIAGNOSIS:   Inadequate oral intake related to acute illness(colon perf) as evidenced by per patient/family report, NPO status.  addressed with TPN  GOAL:   Patient will meet greater than or equal to 90% of their needs  Met with TPN.  MONITOR:   PO intake, Supplement acceptance, Labs, Weight trends, Skin, I & O's, Other (Comment)(TPN)  ASSESSMENT:   Patient with PMH of A-fib, Lung Cancer, Asthma, HTN, and borderline DM presents with abdominal pain. On CT she was noted to have diffuse inflammation of the distal jejenum extending to the descending/sigmoid colon with perforation which may be secondary to diverticulitis.    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 -Patient was extubated on 8/3.  Visited pt's room today. Pt eating breakfast at time of RD visit; pt eating fairly well. Pt drinking about 50% of the butter pecan Nepro and eating some Magic Cups. SLP to see pt again today; may possibly be able to upgrade diet. Per chart, pt back down to her admit weight. Will plan to discontinue TPN today and continue to encourage pt to drink supplements.   Medications reviewed and include: eliquis, lasix, insulin, megace, remeron, KCl  Labs reviewed: K 3.2(L), P 3.5 wnl, Mg 2.0 wnl  Diet Order:   Diet Order            DIET - DYS 1 Room service appropriate? Yes;  Fluid consistency: Nectar Thick  Diet effective now             EDUCATION NEEDS:   Not appropriate for education at this time  Skin: Reviewed RN Assessment(incision abdomen )  Last BM:  8/13- ostomy   Height:   Ht Readings from Last 1 Encounters:  12/27/17 4' 11"  (1.499 m)    Weight:   Wt Readings from Last 1 Encounters:  01/18/18 72.4 kg    Ideal Body Weight:  44.69 kg  BMI:  Body mass index is 32.24 kg/m.  Estimated Nutritional Needs:   Kcal:  1815-2180 (25-30 kcal/kg)  Protein:  87-102 grams (1.2-1.4g/kg)  Fluid:  per MD  CKoleen DistanceMS, RD, LDN Pager #- [phone]#- [phone] Hours Pager: [phone]

## 2018-01-18 NOTE — Clinical Social Work Note (Addendum)
CSW continuing to follow patient's progress throughout discharge planning.  Husband is considering having patient come home instead of going to SNF.  Updated clinicals have been sent to SNFs, in case patient's husband wants to go to SNF.  Jones Broom. Norval Morton, MSW, [place]  01/18/2018 4:36 PM

## 2018-01-18 NOTE — Consult Note (Signed)
[facility] Nurse ostomy follow up Stoma type/location: LLQ colostomy.  Pouch change performed today with spouse at bedside and participating in care.  Stomal assessment/size:  1 1/4" round pink And moist  Well budded Peristomal assessment: intact Treatment options for stomal/peristomal skin: barrier ring and two piece pouch system. Output soft brown stool Ostomy pouching: 2pc 2 1/4" pouch set with barrier ring.  Education provided: patient discharge disposition may now be home with HH.  Spouse participates in cutting barrier and open/roll closure to empty. Discussed twice weekly pouch changes and emptying several times daily when 1/3 full.  Supplies for discharge (5 pouch sets) ordered and to be delivered to room.  Will complete secure start paper work todayy Enrolled patient in [place] Start Discharge program: Yes [place] team following Jody Moras RN BSN [facility] Pager [phone]

## 2018-01-18 NOTE — Care Management Note (Addendum)
Case Management Note  Patient Details  Name: Jody [name] MRN: [id number] Date of Birth: Jun 02, 1941  Subjective/Objective:                 Spoke with Wetzel Bjornstad regarding need to actively involve husband in ostomy care as patient may  end up discharging home rather that skilled nursing if her functional status improves. She will enroll patient in the [facility] secure program.  Husband cut the wafer today and has observed several changes. Wafer changed today and is not due to be changed again until the weekend. Hope to Advance diet today and discontinue TPN. SLP will reassess today to see if can advanced diet and nectar thick.  No agency preference for home health.  Will require home visit within 24 hours of discahrge   Action/Plan:  Late entry- Heads up referral to [facility]. Agency checking to see if patient is in their service area. Expected Discharge Date:                  Expected Discharge Plan:     In-House Referral:     Discharge planning Services     Post Acute Care Choice:    Choice offered to:     DME Arranged:    DME Agency:     HH Arranged:    HH Agency:     Status of Service:     If discussed at [employer], dates discussed:    Additional Comments:  Katrina Stack, RN 01/18/2018, 10:08 AM

## 2018-01-18 NOTE — NC FL2 (Signed)
[place] LEVEL OF CARE SCREENING TOOL     IDENTIFICATION  Patient Name: Jody [name] Birthdate: Sep 27, 1940 Sex: female Admission Date (Current Location): 12/27/2017  [place] and [place] Number:  Engineering geologist and Address:  [facility], [address] [place], [place] [address]      Provider Number: [id number]  Attending Physician Name and Address:  Gladstone Lighter, MD  Relative Name and Phone Number:  Skylen, Danielsen [phone]     Current Level of Care: Hospital Recommended Level of Care: [place] Prior Approval Number:    Date Approved/Denied:   PASRR Number: [id number]  Discharge Plan: SNF    Current Diagnoses: Patient Active Problem List   Diagnosis Date Noted  . Perforation bowel ([place])   . Poor appetite   . Acute delirium   . Goals of care, counseling/discussion   . Palliative care encounter   . Diverticulitis of colon with perforation 12/27/2017  . Atrial fibrillation with RVR ([place]) 12/27/2017  . Malignant neoplasm of upper lobe of right lung ([place]) 12/31/2015  . 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 TI (tricuspid incompetence) 01/21/2015  . Anxiety 01/13/2015  . Clinical depression 01/13/2015  . 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 Borderline diabetes mellitus 01/13/2015  . Benign essential HTN 01/06/2015  . Essential (primary) hypertension 01/06/2015  . Atrial fibrillation, chronic ([place]) 05/20/2014  . 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 Chronic atrial fibrillation ([place]) 05/20/2014  . 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 Cough 11/20/2013  . Breath shortness 11/01/2013  . Breathlessness on exertion 10/30/2013    Orientation RESPIRATION BLADDER Height & Weight     Self  Normal Incontinent Weight: 159 lb 9.6 oz (72.4 kg) Height:  4\' 11"  (149.9 cm)  BEHAVIORAL SYMPTOMS/MOOD NEUROLOGICAL BOWEL NUTRITION STATUS      Continent, Colostomy Diet(Dysphagia 3  diet)  AMBULATORY STATUS COMMUNICATION OF NEEDS Skin   Limited Assist Verbally Surgical wounds                       Personal Care Assistance Level of Assistance  Bathing, Feeding, Dressing Bathing Assistance: Limited assistance Feeding assistance: Maximum assistance Dressing Assistance: Limited assistance     Functional Limitations Info  Sight, Hearing, Speech Sight Info: Adequate Hearing Info: Adequate Speech Info: Adequate    SPECIAL CARE FACTORS FREQUENCY  PT (By licensed PT), OT (By licensed OT)     PT Frequency: 5x a week OT Frequency: 5x a week            Contractures Contractures Info: Not present    Additional Factors Info  Code Status, Allergies Code Status Info: DNR Allergies Info: POISON OAK EXTRACT, AMPICILLIN, PENICILLIN V POTASSIUM            Current Medications (01/18/2018):  This is the current hospital active medication list Current Facility-Administered Medications  Medication Dose Route Frequency Provider Last Rate Last Dose  . Marland KitchenTPN (CLINIMIX-E) Adult   Intravenous Continuous TPN Gladstone Lighter, MD   Stopped at 01/18/18 1619  . acetaminophen (TYLENOL) suppository 650 mg  650 mg Rectal U4Q PRN Pershing Proud, NP   034 mg at 01/15/18 0008  . amiodarone (PACERONE) tablet 400 mg  400 mg Oral BID Gladstone Lighter, MD   400 mg at 01/18/18 1030  . apixaban (ELIQUIS) tablet 5 mg  5 mg Oral BID Gladstone Lighter, MD   5 mg at 01/18/18 1031  . chlorhexidine (PERIDEX) 0.12 % solution 15 mL  15 mL Mouth Rinse BID Conforti, John, DO   15 mL at 01/18/18 1030  . digoxin (LANOXIN) tablet 0.125 mg  0.125 mg Oral Daily Gladstone Lighter, MD   0.125 mg at 01/18/18 1031  . diltiazem (CARDIZEM) tablet 60 mg  60 mg Oral Q8H Gladstone Lighter, MD   60 mg at 01/18/18 1429  . feeding supplement (NEPRO CARB STEADY) liquid 237 mL  237 mL Oral BID BM Gladstone Lighter, MD   237 mL at 01/18/18 1116  . FLUoxetine (PROZAC) capsule 20 mg  20 mg Oral Daily  Gladstone Lighter, MD      . furosemide (LASIX) tablet 20 mg  20 mg Oral BID Gladstone Lighter, MD   20 mg at 01/18/18 1115  . levalbuterol (XOPENEX) nebulizer solution 0.63 mg  0.63 mg Nebulization Q6H PRN Awilda Bill, NP   0.63 mg at 12/31/17 0846  . MEDLINE mouth rinse  15 mL Mouth Rinse q12n4p Conforti, John, DO   15 mL at 01/17/18 1709  . megestrol (MEGACE) 400 MG/10ML suspension 300 mg  300 mg Oral BID Gladstone Lighter, MD   300 mg at 01/18/18 1031  . [START ON 01/19/2018] metoprolol succinate (TOPROL-XL) 24 hr tablet 50 mg  50 mg Oral Daily Gladstone Lighter, MD      . metoprolol tartrate (LOPRESSOR) injection 2.5-5 mg  2.5-5 mg Intravenous Q3H PRN Wilhelmina Mcardle, MD   5 mg at 01/13/18 0541  . metoprolol tartrate (LOPRESSOR) tablet 25 mg  25 mg Oral BID Gladstone Lighter, MD   25 mg at 01/17/18 2215  . mirtazapine (REMERON) tablet 15 mg  15 mg Oral QHS Pershing Proud, NP   15 mg at 01/17/18 2226  . [START ON 01/19/2018] multivitamin with minerals tablet 1 tablet  1 tablet Oral Daily Gladstone Lighter, MD      . ondansetron (ZOFRAN) injection 4 mg  4 mg Intravenous Q4H PRN Flora Lipps, MD   4 mg at 01/03/18 1550  . promethazine (PHENERGAN) injection 25 mg  25 mg Intravenous Q6H PRN Conforti, John, DO   25 mg at 01/04/18 0114  . risperiDONE (RISPERDAL) tablet 0.5 mg  0.5 mg Oral QHS Gladstone Lighter, MD   0.5 mg at 01/17/18 2215  . sodium chloride 0.9 % bolus 1,000 mL  1,000 mL Intravenous Once Awilda Bill, NP      . sodium chloride flush (NS) 0.9 % injection 10-40 mL  10-40 mL Intracatheter PRN Sakai, Isami, DO      . sodium chloride flush (NS) 0.9 % injection 10-40 mL  10-40 mL Intracatheter Q12H Mody, Sital, MD   10 mL at 01/18/18 1232  . sodium chloride flush (NS) 0.9 % injection 10-40 mL  10-40 mL Intracatheter PRN Bettey Costa, MD   20 mL at 01/15/18 2102     Discharge Medications: Please see discharge summary for a list of discharge medications.  Relevant  Imaging Results:  Relevant Lab Results:   Additional Information SSN [id number]  Ross Ludwig, [place]

## 2018-01-18 NOTE — Progress Notes (Signed)
Occupational Therapy Treatment Patient Details Name: Jody [name] MRN: [id number] DOB: 1940-08-01 Today's Date: 01/18/2018    History of present illness Pt is a 77 y.o. female presenting to hospital 12/27/17 d/t concerns of abdominal pain, nausea, and emesis; imaging showing perforated bowel (secondary diverticulitis).  Pt also with acute toxic metabolic encephalopathy, acute respiratory failure with hypoxia, a-fib with RVR, and acute anasarca.  Pt initially evaluated by PT 01/04/18.  S/p exploratory laparatomy, lysis of adhesions, abdominal washout, splenic flexure takedown and L colon resection with colostomy 01/05/18.  No new PT order received post op (pt intubated initially and therapy services discontinued).  Extubated 01/07/18.  New PT order received 01/17/18.  PMH includes moderate sized hiatal hernia, a-fib, lung CA, htn, anxiety, depression.   OT comments  Pt seen for OT tx this pm with focus on seated EOB ADL activities to improve strength, activity tolerance, and sitting balance. Pt able to perform sup>sit with HOB elevated requiring Min A with HR increasing from 110's to 130. With cues for pursed lip breathing and to minimize talking while recovering from mobility exertion, pt able to maintain HR between 110's and 120's. Seated EOB pt able to apply deodorant to underarms requiring CGA for sitting balance and min assist to apply to R underarm with noted L lateral lean/slight LOB to L requiring min assist from OT to recover. Pt HR up to 138. Mod assist to return to bed with pt able to assist in pulling herself up in the bed with min assist and bed positioning adjusted. Pt appreciative of session. RN notified of HR during session and that SCD's were applied. Pt progressing slowly towards goals. Continues to benefit from OT services, STR remains appropriate. Updated plan of care frequency to reflect pt needs.   Follow Up Recommendations  SNF    Equipment Recommendations  None recommended by OT     Recommendations for Other Services      Precautions / Restrictions Precautions Precautions: Fall Precaution Comments: ostomy LUQ; TPN; abdominal wound/incision Restrictions Weight Bearing Restrictions: No       Mobility Bed Mobility Overal bed mobility: Needs Assistance Bed Mobility: Supine to Sit;Sit to Supine     Supine to sit: Min assist;HOB elevated Sit to supine: Mod assist   General bed mobility comments: assist for trunk and BLE back to bed  Transfers                      Balance Overall balance assessment: Needs assistance Sitting-balance support: Feet supported;Single extremity supported Sitting balance-Leahy Scale: Poor                                     ADL either performed or assessed with clinical judgement   ADL Overall ADL's : Needs assistance/impaired     Grooming: Sitting;Minimal assistance;Applying deodorant Grooming Details (indicate cue type and reason): seated EOB with CGA, pt able to apply deodorant to L underarm with CGA and required Min A for applying to R underarm.                                      Vision Baseline Vision/History: Wears glasses Wears Glasses: At all times Patient Visual Report: No change from baseline     Perception     Praxis      Cognition  Arousal/Alertness: Awake/alert Behavior During Therapy: Flat affect Overall Cognitive Status: Impaired/Different from baseline Area of Impairment: Following commands;Orientation;Problem solving                 Orientation Level: Disoriented to;Place;Time;Situation     Following Commands: Follows multi-step commands inconsistently;Follows multi-step commands with increased time     Problem Solving: Slow processing;Requires tactile cues;Requires verbal cues;Difficulty sequencing          Exercises     Shoulder Instructions       General Comments      Pertinent Vitals/ Pain       Pain Assessment: No/denies  pain  Home Living                                          Prior Functioning/Environment              Frequency  Min 2X/week        Progress Toward Goals  OT Goals(current goals can now be found in the care plan section)  Progress towards OT goals: Progressing toward goals  Acute Rehab OT Goals Patient Stated Goal: to get stronger OT Goal Formulation: With patient Time For Goal Achievement: 01/31/18 Potential to Achieve Goals: Good  Plan Discharge plan remains appropriate;Frequency needs to be updated    Co-evaluation                 AM-PAC PT "6 Clicks" Daily Activity     Outcome Measure   Help from another person eating meals?: Total Help from another person taking care of personal grooming?: A Little Help from another person toileting, which includes using toliet, bedpan, or urinal?: A Lot Help from another person bathing (including washing, rinsing, drying)?: A Lot Help from another person to put on and taking off regular upper body clothing?: A Little Help from another person to put on and taking off regular lower body clothing?: A Lot 6 Click Score: 13    End of Session    OT Visit Diagnosis: Muscle weakness (generalized) (M62.81);Other abnormalities of gait and mobility (R26.89)   Activity Tolerance Patient tolerated treatment well   Patient Left in bed;with call bell/phone within reach;with bed alarm set;with SCD's reapplied   Nurse Communication Other (comment)(SCD's in place, HR during session)        Time: 9373-4287 OT Time Calculation (min): 18 min  Charges: OT General Charges $OT Visit: 1 Visit OT Treatments $Self Care/Home Management : 8-22 mins  Jeni Salles, MPH, MS, OTR/L ascom [phone] 01/18/18, 5:13 PM

## 2018-01-18 NOTE — Progress Notes (Signed)
[facility] [place] NAME: Jody [name]    MR#:  [id number]  DATE OF BIRTH:  [birth year]  SUBJECTIVE:  CHIEF COMPLAINT:   Chief Complaint  Patient presents with  . Abdominal Pain  . Emesis  . Atrial Fibrillation   -More alert today.  Slept better last night.  Able to sit in the chair today -Improved nutrition, oral intake  REVIEW OF SYSTEMS:  Review of Systems  Constitutional: Positive for malaise/fatigue and weight loss. Negative for chills and fever.  HENT: Negative for congestion, ear discharge, hearing loss and nosebleeds.   Eyes: Negative for blurred vision and double vision.  Respiratory: Negative for cough, shortness of breath and wheezing.   Cardiovascular: Negative for chest pain and palpitations.  Gastrointestinal: Positive for abdominal pain. Negative for constipation, diarrhea, nausea and vomiting.  Genitourinary: Negative for dysuria.  Musculoskeletal: Negative for myalgias.  Neurological: Negative for dizziness, speech change, focal weakness, seizures, weakness and headaches.  Psychiatric/Behavioral: Negative for depression.    DRUG ALLERGIES:   Allergies  Allergen Reactions  . Poison Oak Extract Other (See Comments)    blistering  . Ampicillin Rash  . Penicillin V Potassium Rash    Has patient had a PCN reaction causing immediate rash, facial/tongue/throat swelling, SOB or lightheadedness with hypotension: No Has patient had a PCN reaction causing severe rash involving mucus membranes or skin necrosis: No Has patient had a PCN reaction that required hospitalization: No Has patient had a PCN reaction occurring within the last 10 years: No If all of the above answers are "NO", then may proceed with Cephalosporin use.     VITALS:  Blood pressure (!) 110/55, pulse (!) 123, temperature 97.7 F (36.5 C), temperature source Oral, resp. rate 17, height 4\' 11"  (1.499 m), weight 72.4 kg, SpO2 98 %.  PHYSICAL  EXAMINATION:  Physical Exam  GENERAL:  77 y.o.-year-old patient lying in the bed with no acute distress.  EYES: Pupils equal, round, reactive to light and accommodation. No scleral icterus. Extraocular muscles intact.  HEENT: Head atraumatic, normocephalic. Oropharynx and nasopharynx clear.  NECK:  Supple, no jugular venous distention. No thyroid enlargement, no tenderness.  LUNGS: Normal breath sounds bilaterally, no wheezing,  rhonchi or crepitation. No use of accessory muscles of respiration.  Bibasilar coarse Rales CARDIOVASCULAR: S1, S2 normal. No  rubs, or gallops.  2/6 systolic murmur is present ABDOMEN: Soft, nontender, nondistended.  Left lower quadrant colostomy bag in place.  Bowel sounds present. No organomegaly or mass.  EXTREMITIES: No pedal edema, cyanosis, or clubbing.  NEUROLOGIC: Cranial nerves II through XII are intact. Muscle strength 5/5 in all extremities. Sensation intact. Gait not checked.  Global weakness noted.  Following simple commands PSYCHIATRIC: The patient is alert, oriented x2 today SKIN: No obvious rash, lesion, or ulcer.    LABORATORY PANEL:   CBC Recent Labs  Lab 01/14/18 0512  WBC 8.0  HGB 9.4*  HCT 27.1*  PLT 358   ------------------------------------------------------------------------------------------------------------------  Chemistries  Recent Labs  Lab 01/16/18 0459 01/17/18 0512 01/18/18 0537  NA 135 136  --   K 3.3* 3.6 3.2*  CL 95* 96*  --   CO2 32 33*  --   GLUCOSE 160* 114*  --   BUN 25* 26*  --   CREATININE 0.64 0.67  --   CALCIUM 7.9* 8.4*  --   MG 2.1  --  2.0  AST 61*  --   --   ALT 50*  --   --  ALKPHOS 160*  --   --   BILITOT 0.5  --   --    ------------------------------------------------------------------------------------------------------------------  Cardiac Enzymes No results for input(s): TROPONINI in the last 168  hours. ------------------------------------------------------------------------------------------------------------------  RADIOLOGY:  Dg Chest 2 View  Result Date: 01/17/2018 CLINICAL DATA:  CHF EXAM: CHEST - 2 VIEW COMPARISON:  01/10/2018 FINDINGS: Right PICC line remains in place, unchanged. Cardiomegaly with vascular congestion and mild interstitial edema. Bibasilar atelectasis and small layering effusions. No real change since prior study. IMPRESSION: Continued mild interstitial edema/CHF with small effusions and bibasilar atelectasis. No real change. Electronically Signed   By: Rolm Baptise M.D.   On: 01/17/2018 07:15    EKG:   Orders placed or performed during the hospital encounter of 12/27/17  . EKG 12-Lead  . EKG 12-Lead  . ED EKG within 10 minutes  . ED EKG within 10 minutes    ASSESSMENT AND PLAN:   Female with past medical history significant for A. fib on Eliquis, history of lung cancer, hypertension and diabetes brought to hospital secondary to abdominal pain and noted to have perforated bowels  1.  Perforated bowels-secondary to diverticulitis.  Appreciate surgical input. -Patient is status post laparotomy, left colon resection, abdominal washout and colostomy placement. -Was on TPN, speech therapy upgraded her diet to mechanical soft diet with nectar thick liquids. -Tapered off TPN today..  2.  Acute systolic CHF with EF of 40 to 45%.  Also has diastolic dysfunction. - Not on any antibiotics at this time.  Chest x-ray showing pulmonary vascular congestion mild and also bibasilar atelectasis -On oral Lasix now  3.  A. fib with RVR-  on oral amiodarone.  Also on digoxin, Cardizem and metoprolol. -Adjust medications if blood pressure is stable -on  Eliquis for anticoagulation  4.  Hypoalbuminemia-replaced  5.  DVT prophylaxis-on Eliquis  6.  Acute delirium-started risperidone at bedtime and palliative care added Remeron.  Much improved today. -Continue to  monitor  Physical therapy consulted Husband updated at bedside    All the records are reviewed and case discussed with Care Management/Social Workerr. Management plans discussed with the patient, family and they are in agreement.  CODE STATUS: DNR  TOTAL TIME TAKING CARE OF THIS PATIENT: 36 minutes.   POSSIBLE D/C IN 2-3 DAYS, DEPENDING ON CLINICAL CONDITION.   Gladstone Lighter M.D on 01/18/2018 at 3:00 PM  Between 7am to 6pm - Pager - [phone]  After 6pm go to [email or web address] - password [email or web address] [facility]  Office  [phone]  CC: Primary care physician; Baxter Hire, MD

## 2018-01-19 LAB — GLUCOSE, CAPILLARY
GLUCOSE-CAPILLARY: 110 mg/dL — AB (ref 70–99)
GLUCOSE-CAPILLARY: 121 mg/dL — AB (ref 70–99)
Glucose-Capillary: 94 mg/dL (ref 70–99)
Glucose-Capillary: 94 mg/dL (ref 70–99)
Glucose-Capillary: 121 mg/dL — ABNORMAL HIGH (ref 70–99)
Glucose-Capillary: 104 mg/dL — ABNORMAL HIGH (ref 70–99)

## 2018-01-19 LAB — BASIC METABOLIC PANEL
ANION GAP: 8 (ref 5–15)
BUN: 26 mg/dL — ABNORMAL HIGH (ref 8–23)
CALCIUM: 8.9 mg/dL (ref 8.9–10.3)
CO2: 30 mmol/L (ref 22–32)
Chloride: 102 mmol/L (ref 98–111)
Creatinine, Ser: 0.76 mg/dL (ref 0.44–1.00)
Glucose, Bld: 113 mg/dL — ABNORMAL HIGH (ref 70–99)
Potassium: 4 mmol/L (ref 3.5–5.1)
SODIUM: 140 mmol/L (ref 135–145)

## 2018-01-19 MED ORDER — SODIUM CHLORIDE 0.9% FLUSH
10.0000 mL | Freq: Two times a day (BID) | INTRAVENOUS | Status: DC
  Administered 2018-01-19 – 2018-01-21 (×5): 10 mL via INTRAVENOUS

## 2018-01-19 MED ORDER — DILTIAZEM HCL ER COATED BEADS 120 MG PO CP24
240.0000 mg | ORAL_CAPSULE | Freq: Every day | ORAL | Status: DC
  Administered 2018-01-19 – 2018-01-22 (×4): 240 mg via ORAL
  Filled 2018-01-19 (×4): qty 2

## 2018-01-19 NOTE — Clinical Social Work Note (Signed)
CSW attempted to reach patient's husband to get a determination regarding if he had decided between taking patient home versus sending her for short term rehab. As patient is [employer], patient will require auth. CSW went by patient's room and husband was not in the room, CSW called the husband's contact number: [phone] and it went right to voice mail stating the voice mail had not been set up. Shela Leff MSW,LCSW [phone]

## 2018-01-19 NOTE — Progress Notes (Signed)
Palliative:  I met today at Ms. Dolly bedside and her husband, brother, and sister-in-law are at bedside. Mr. Delancy says that she had a better night the past couple nights and seems a little better yesterday and this morning. Her breakfast is at bedside and appears to have had a little more improvement in intake (although still far from adequate). He is still hopeful that we are turning a corner. He is working with [facility] and considering taking her home instead of rehab. Ms. Zaila is sitting in recliner, talking, and appears more alert today than previous days. Encouraged her to eat every couple hours and to increase intake. All questions/concerns addressed. Emotional support provided.   15 min  Vinie Sill, NP Palliative Medicine Team Pager # [phone] (M-F 8a-5p) Team Phone # [phone] (Nights/Weekends)

## 2018-01-19 NOTE — Consult Note (Signed)
[facility] Nurse ostomy follow up Took home supplies to patient and paperwork signed and submitted for secure start.  Spouse agrees to perform pouch change tomorrow AM.  Anticipate discharge over the weekend.  [facility] team will follow Domenic Moras RN BSN [facility] Pager [phone]

## 2018-01-19 NOTE — Progress Notes (Signed)
Physical Therapy Treatment Patient Details Name: Jody [name] MRN: [id number] DOB: 1940/12/03 Today's Date: 01/19/2018    History of Present Illness Pt is a 77 y.o. female presenting to hospital 12/27/17 d/t concerns of abdominal pain, nausea, and emesis; imaging showing perforated bowel (secondary diverticulitis).  Pt also with acute toxic metabolic encephalopathy, acute respiratory failure with hypoxia, a-fib with RVR, and acute anasarca.  Pt initially evaluated by PT 01/04/18.  S/p exploratory laparatomy, lysis of adhesions, abdominal washout, splenic flexure takedown and L colon resection with colostomy 01/05/18.  No new PT order received post op (pt intubated initially and therapy services discontinued).  Extubated 01/07/18.  New PT order received 01/17/18.  PMH includes moderate sized hiatal hernia, a-fib, lung CA, htn, anxiety, depression.    PT Comments    Pt able to perform semi-supine to sit with decreased assist today but still requiring 2 assist to stand up to walker and only able to maintain standing for 5-10 seconds before needing to sit down onto bed d/t B LE weakness (B LE's noted to be "shaking" with increased time standing).  Pt able to briefly weight-shift in standing to L/R but unable to take any steps with walker use and 2 assist.  Performed stand pivot bed to recliner to L with mod to max assist x2.  Pt fatigued with activity and SOB noted (O2 sats WFL on room air during session).  Will continue to progress pt with strengthening, balance, and progressive functional mobility per pt tolerance.    Follow Up Recommendations  SNF     Equipment Recommendations  Rolling walker with 5" wheels(youth sized)    Recommendations for Other Services OT consult     Precautions / Restrictions Precautions Precautions: Fall Precaution Comments: ostomy LUQ; TPN; abdominal wound/incision Restrictions Weight Bearing Restrictions: No    Mobility  Bed Mobility Overal bed mobility: Needs  Assistance Bed Mobility: Supine to Sit     Supine to sit: Mod assist     General bed mobility comments: assist for trunk and B LE's  Transfers Overall transfer level: Needs assistance Equipment used: Rolling walker (2 wheeled) Transfers: Sit to/from [facility] Sit to Stand: Min assist;Mod assist;+2 physical assistance Stand pivot transfers: Mod assist;Max assist;+2 physical assistance       General transfer comment: x3 trials standing with walker with vc's and tactile cues for UE and LE placement (pt unable to stand more than 5-10 seconds d/t B LE weakness); performed stand pivot with mod to max assist x2 bed to recliner to L side  Ambulation/Gait             General Gait Details: pt able to shift weight R/L with B UE support on walker but unable to lift either foot off of ground to advance d/t B LE weakness   Stairs             Wheelchair Mobility    Modified Rankin (Stroke Patients Only)       Balance Overall balance assessment: Needs assistance Sitting-balance support: Feet supported;Single extremity supported Sitting balance-Leahy Scale: Poor Sitting balance - Comments: requires at least single UE support for static sitting balance   Standing balance support: Bilateral upper extremity supported Standing balance-Leahy Scale: Poor Standing balance comment: requires B UE support on RW and CGA x2 for safety standing                            Cognition Arousal/Alertness: Awake/alert Behavior  During Therapy: WFL for tasks assessed/performed Overall Cognitive Status: Impaired/Different from baseline Area of Impairment: Following commands;Orientation;Problem solving                 Orientation Level: Disoriented to;Place;Time;Situation   Memory: Decreased short-term memory Following Commands: Follows multi-step commands inconsistently;Follows multi-step commands with increased time     Problem Solving: Slow  processing;Requires tactile cues;Requires verbal cues;Difficulty sequencing        Exercises Transfer training    General Comments General comments (skin integrity, edema, etc.): ostomy intact (no leaking noted) beginning and end of session; abdominal incision and dressing intact beginning/end of session.  Nursing cleared pt for participation in physical therapy.  Pt agreeable to PT session.      Pertinent Vitals/Pain Pain Assessment: No/denies pain  Vitals (HR and O2 on room air) stable and WFL throughout treatment session.    Home Living                      Prior Function            PT Goals (current goals can now be found in the care plan section) Acute Rehab PT Goals Patient Stated Goal: to get stronger PT Goal Formulation: With patient/family Time For Goal Achievement: 01/31/18 Potential to Achieve Goals: Fair Progress towards PT goals: Progressing toward goals    Frequency    Min 2X/week      PT Plan Current plan remains appropriate    Co-evaluation              AM-PAC PT "6 Clicks" Daily Activity  Outcome Measure  Difficulty turning over in bed (including adjusting bedclothes, sheets and blankets)?: Unable Difficulty moving from lying on back to sitting on the side of the bed? : Unable Difficulty sitting down on and standing up from a chair with arms (e.g., wheelchair, bedside commode, etc,.)?: Unable Help needed moving to and from a bed to chair (including a wheelchair)?: Total Help needed walking in hospital room?: Total Help needed climbing 3-5 steps with a railing? : Total 6 Click Score: 6    End of Session Equipment Utilized During Treatment: Gait belt(gait belt up high off of incision and ostomy) Activity Tolerance: Patient limited by fatigue Patient left: in chair;with call bell/phone within reach;with chair alarm set;with family/visitor present;Other (comment)(B heels elevated via pillow) Nurse Communication: Mobility  status;Precautions;Need for lift equipment;Other (comment)(2 person transfer vs hoyer lift back to bed) PT Visit Diagnosis: Other abnormalities of gait and mobility (R26.89);Muscle weakness (generalized) (M62.81);Difficulty in walking, not elsewhere classified (R26.2)     Time: 2620-3559 PT Time Calculation (min) (ACUTE ONLY): 30 min  Charges:  $Therapeutic Activity: 23-37 mins                    Leitha Bleak, PT 01/19/18, 4:24 PM [phone]

## 2018-01-19 NOTE — Care Management (Signed)
Per Corene Cornea with [facility] they can accept patient for nursing as patient is with the [facility] branch.

## 2018-01-19 NOTE — Progress Notes (Signed)
Cardizem held at 06:00 due to BP of 92/67. MD Prasanna made aware. No new orders. Pt has no concerns at this time.

## 2018-01-19 NOTE — Progress Notes (Signed)
[facility] for apixaban Indication: atrial fibrillation  Allergies  Allergen Reactions  . Poison Oak Extract Other (See Comments)    blistering  . Ampicillin Rash  . Penicillin V Potassium Rash    Has patient had a PCN reaction causing immediate rash, facial/tongue/throat swelling, SOB or lightheadedness with hypotension: No Has patient had a PCN reaction causing severe rash involving mucus membranes or skin necrosis: No Has patient had a PCN reaction that required hospitalization: No Has patient had a PCN reaction occurring within the last 10 years: No If all of the above answers are "NO", then may proceed with Cephalosporin use.     Patient Measurements: Height: 4\' 11"  (149.9 cm) Weight: 157 lb 3.2 oz (71.3 kg) IBW/kg (Calculated) : 43.2  Vital Signs: Temp: 97.9 F (36.6 C) (08/15 0743) Temp Source: Oral (08/15 0743) BP: 113/52 (08/15 1345) Pulse Rate: 95 (08/15 1345)  Labs: Recent Labs    01/17/18 0512 01/19/18 0408  CREATININE 0.67 0.76    Estimated Creatinine Clearance: 49 mL/min (by C-G formula based on SCr of 0.76 mg/dL).  Assessment: 77 year old female admitted for abdominal pain/emesis/AF. Patient on apixaban at home but was switched to Lovenox while NPO. Patient's PO status has improved and she has been back on apixaban since 8/12.  Goal of Therapy:  Monitor platelets by anticoagulation protocol: Yes   Plan:  Continue apixaban 5 mg PO BID for AF.  Will order CBC with am labs for monitoring per protocol.  Pharmacy will continue to follow.  Tawnya Crook, PharmD Pharmacy Resident  01/19/2018 2:34 PM

## 2018-01-19 NOTE — Progress Notes (Signed)
Occupational Therapy Treatment Patient Details Name: Jody [name] MRN: [id number] DOB: [birth year] Today's Date: 01/19/2018    History of present illness Pt is a 77 y.o. female presenting to hospital 12/27/17 d/t concerns of abdominal pain, nausea, and emesis; imaging showing perforated bowel (secondary diverticulitis).  Pt also with acute toxic metabolic encephalopathy, acute respiratory failure with hypoxia, a-fib with RVR, and acute anasarca.  Pt initially evaluated by PT 01/04/18.  S/p exploratory laparatomy, lysis of adhesions, abdominal washout, splenic flexure takedown and L colon resection with colostomy 01/05/18.  No new PT order received post op (pt intubated initially and therapy services discontinued).  Extubated 01/07/18.  New PT order received 01/17/18.  PMH includes moderate sized hiatal hernia, a-fib, lung CA, htn, anxiety, depression.   OT comments  Pt seen for OT tx this date. Pt sleeping soundly upon arrival, family in room. Family agreeable to OT attempting. Pt wakes easily to voice and touch, states, "I'll try" when asked about OT. Pt initially requesting to use the restroom. Pt required max verbal cues to remind her of the external catheter in place, as she attempted to reach for EOB approx 4x. Pt instructed in bed level with HOB elevated BUE there-ex (see below for details) and grooming activities requiring set up and min verbal cues for sequencing and problem solving, particularly with opening containers. Visual demo provided with max cues for there-ex with pt able to return demo. HR at rest in bed 107, up to 124 during there-ex. Pt continues to benefit from skilled OT services. Will continue to progress. STR remains most appropriate.    Follow Up Recommendations  SNF    Equipment Recommendations  3 in 1 bedside commode    Recommendations for Other Services      Precautions / Restrictions Precautions Precautions: Fall Precaution Comments: ostomy LUQ; TPN; abdominal  wound/incision Restrictions Weight Bearing Restrictions: No       Mobility Bed Mobility                  Transfers                      Balance                                           ADL either performed or assessed with clinical judgement   ADL Overall ADL's : Needs assistance/impaired     Grooming: Applying deodorant;Bed level;Set up Grooming Details (indicate cue type and reason): cues for how to open deodorant and lotion containers                                     Vision Baseline Vision/History: Wears glasses Wears Glasses: At all times Patient Visual Report: No change from baseline     Perception     Praxis      Cognition Arousal/Alertness: Awake/alert Behavior During Therapy: WFL for tasks assessed/performed Overall Cognitive Status: Impaired/Different from baseline Area of Impairment: Following commands;Orientation;Problem solving                 Orientation Level: Disoriented to;Place;Time;Situation   Memory: Decreased short-term memory Following Commands: Follows multi-step commands inconsistently;Follows multi-step commands with increased time     Problem Solving: Slow processing;Requires tactile cues;Requires verbal cues;Difficulty sequencing  Exercises Other Exercises Other Exercises: Pt instructed in BUE ther-ex including shoulder flex/ext/abd/add, elbow flex/ext 1x10 each   Shoulder Instructions       General Comments      Pertinent Vitals/ Pain       Pain Assessment: No/denies pain  Home Living                                          Prior Functioning/Environment              Frequency  Min 2X/week        Progress Toward Goals  OT Goals(current goals can now be found in the care plan section)  Progress towards OT goals: Progressing toward goals  Acute Rehab OT Goals Patient Stated Goal: to get stronger OT Goal Formulation: With  patient Time For Goal Achievement: 01/31/18 Potential to Achieve Goals: Good  Plan Discharge plan remains appropriate;Frequency remains appropriate    Co-evaluation                 AM-PAC PT "6 Clicks" Daily Activity     Outcome Measure   Help from another person eating meals?: A Lot Help from another person taking care of personal grooming?: A Little Help from another person toileting, which includes using toliet, bedpan, or urinal?: A Lot Help from another person bathing (including washing, rinsing, drying)?: A Lot Help from another person to put on and taking off regular upper body clothing?: A Lot Help from another person to put on and taking off regular lower body clothing?: A Lot 6 Click Score: 13    End of Session    OT Visit Diagnosis: Muscle weakness (generalized) (M62.81);Other abnormalities of gait and mobility (R26.89)   Activity Tolerance Patient tolerated treatment well   Patient Left in bed;with call bell/phone within reach;with bed alarm set;with family/visitor present   Nurse Communication          Time: 6244-6950 OT Time Calculation (min): 23 min  Charges: OT General Charges $OT Visit: 1 Visit OT Treatments $Self Care/Home Management : 8-22 mins $Therapeutic Exercise: 8-22 mins   Jeni Salles, MPH, MS, OTR/L ascom [phone] 01/19/18, 3:06 PM

## 2018-01-19 NOTE — Progress Notes (Signed)
[facility] [place] NAME: Jody [name]    MR#:  [id number]  DATE OF BIRTH:  06-Oct-1940  SUBJECTIVE:  CHIEF COMPLAINT:   Chief Complaint  Patient presents with  . Abdominal Pain  . Emesis  . Atrial Fibrillation   -Significantly tired, unable to do a lot with physical therapy today.  Sleeping this afternoon.  Poor oral intake.  REVIEW OF SYSTEMS:  Review of Systems  Constitutional: Positive for malaise/fatigue and weight loss. Negative for chills and fever.  HENT: Negative for congestion, ear discharge, hearing loss and nosebleeds.   Eyes: Negative for blurred vision and double vision.  Respiratory: Negative for cough, shortness of breath and wheezing.   Cardiovascular: Negative for chest pain and palpitations.  Gastrointestinal: Positive for abdominal pain. Negative for constipation, diarrhea, nausea and vomiting.  Genitourinary: Negative for dysuria.  Musculoskeletal: Negative for myalgias.  Neurological: Negative for dizziness, speech change, focal weakness, seizures, weakness and headaches.  Psychiatric/Behavioral: Negative for depression.    DRUG ALLERGIES:   Allergies  Allergen Reactions  . Poison Oak Extract Other (See Comments)    blistering  . Ampicillin Rash  . Penicillin V Potassium Rash    Has patient had a PCN reaction causing immediate rash, facial/tongue/throat swelling, SOB or lightheadedness with hypotension: No Has patient had a PCN reaction causing severe rash involving mucus membranes or skin necrosis: No Has patient had a PCN reaction that required hospitalization: No Has patient had a PCN reaction occurring within the last 10 years: No If all of the above answers are "NO", then may proceed with Cephalosporin use.     VITALS:  Blood pressure (!) 113/52, pulse 95, temperature 97.9 F (36.6 C), temperature source Oral, resp. rate (!) 40, height 4\' 11"  (1.499 m), weight 71.3 kg, SpO2 96 %.  PHYSICAL  EXAMINATION:  Physical Exam  GENERAL:  77 y.o.-year-old patient lying in the bed with no acute distress.  EYES: Pupils equal, round, reactive to light and accommodation. No scleral icterus. Extraocular muscles intact.  HEENT: Head atraumatic, normocephalic. Oropharynx and nasopharynx clear.  NECK:  Supple, no jugular venous distention. No thyroid enlargement, no tenderness.  LUNGS: Normal breath sounds bilaterally, no wheezing,  rhonchi or crepitation. No use of accessory muscles of respiration.  Bibasilar coarse Rales CARDIOVASCULAR: S1, S2 normal. No  rubs, or gallops.  2/6 systolic murmur is present ABDOMEN: Soft, nontender, nondistended.  Left lower quadrant colostomy bag in place.  Bowel sounds present. No organomegaly or mass.  EXTREMITIES: No pedal edema, cyanosis, or clubbing.  NEUROLOGIC: Cranial nerves II through XII are intact. Muscle strength 5/5 in all extremities. Sensation intact. Gait not checked.  Global weakness noted.  Following simple commands PSYCHIATRIC: The patient is alert, oriented x2 today SKIN: No obvious rash, lesion, or ulcer.    LABORATORY PANEL:   CBC Recent Labs  Lab 01/14/18 0512  WBC 8.0  HGB 9.4*  HCT 27.1*  PLT 358   ------------------------------------------------------------------------------------------------------------------  Chemistries  Recent Labs  Lab 01/16/18 0459  01/18/18 0537 01/19/18 0408  NA 135   < >  --  140  K 3.3*   < > 3.2* 4.0  CL 95*   < >  --  102  CO2 32   < >  --  30  GLUCOSE 160*   < >  --  113*  BUN 25*   < >  --  26*  CREATININE 0.64   < >  --  0.76  CALCIUM 7.9*   < >  --  8.9  MG 2.1  --  2.0  --   AST 61*  --   --   --   ALT 50*  --   --   --   ALKPHOS 160*  --   --   --   BILITOT 0.5  --   --   --    < > = values in this interval not displayed.   ------------------------------------------------------------------------------------------------------------------  Cardiac Enzymes No results for  input(s): TROPONINI in the last 168 hours. ------------------------------------------------------------------------------------------------------------------  RADIOLOGY:  No results found.  EKG:   Orders placed or performed during the hospital encounter of 12/27/17  . EKG 12-Lead  . EKG 12-Lead  . ED EKG within 10 minutes  . ED EKG within 10 minutes    ASSESSMENT AND PLAN:   Female with past medical history significant for A. fib on Eliquis, history of lung cancer, hypertension and diabetes brought to hospital secondary to abdominal pain and noted to have perforated bowels  1.  Perforated bowels-secondary to diverticulitis.  Appreciate surgical input. -Patient is status post laparotomy, left colon resection, abdominal washout and colostomy placement. -Was on TPN, speech therapy upgraded her diet to mechanical soft diet with nectar thick liquids. -Tapered off TPN..  2.  Acute systolic CHF with EF of 40 to 45%.  Also has diastolic dysfunction. - Not on any antibiotics at this time.  Chest x-ray showing pulmonary vascular congestion mild and also bibasilar atelectasis -On oral Lasix now  3.  A. fib with RVR-  on oral amiodarone.  Also on digoxin, Cardizem and metoprolol. -Adjust medications if blood pressure is stable -on  Eliquis for anticoagulation  4.  Hypoalbuminemia-replaced  5.  DVT prophylaxis-on Eliquis  6.  Acute delirium-on risperidone at bedtime and palliative care added Remeron.  Slowly improving, still not back to baseline. -Continue to monitor  Physical therapy consulted- recommending SNF Husband updated at bedside    All the records are reviewed and case discussed with Care Management/Social Workerr. Management plans discussed with the patient, family and they are in agreement.  CODE STATUS: DNR  TOTAL TIME TAKING CARE OF THIS PATIENT: 32 minutes.   POSSIBLE D/C IN 2-3 DAYS, DEPENDING ON CLINICAL CONDITION.   Gladstone Lighter M.D on 01/19/2018 at 3:21  PM  Between 7am to 6pm - Pager - [phone]  After 6pm go to [email or web address] - password [email or web address] [facility]  Office  [phone]  CC: Primary care physician; Baxter Hire, MD

## 2018-01-20 LAB — COMPREHENSIVE METABOLIC PANEL
ALT: 41 U/L (ref 0–44)
AST: 23 U/L (ref 15–41)
Albumin: 3.5 g/dL (ref 3.5–5.0)
Alkaline Phosphatase: 112 U/L (ref 38–126)
Anion gap: 10 (ref 5–15)
BUN: 28 mg/dL — AB (ref 8–23)
CHLORIDE: 104 mmol/L (ref 98–111)
CO2: 27 mmol/L (ref 22–32)
CREATININE: 0.88 mg/dL (ref 0.44–1.00)
Calcium: 8.9 mg/dL (ref 8.9–10.3)
GFR calc Af Amer: >60 mL/min (ref >60)
GFR calc non Af Amer: >60 mL/min (ref >60)
GLUCOSE: 156 mg/dL — AB (ref 70–99)
Potassium: 3.6 mmol/L (ref 3.5–5.1)
SODIUM: 141 mmol/L (ref 135–145)
Total Bilirubin: 1.1 mg/dL (ref 0.3–1.2)
Total Protein: 6.6 g/dL (ref 6.5–8.1)

## 2018-01-20 LAB — CBC
HCT: 28.2 % — ABNORMAL LOW (ref 35.0–47.0)
HEMOGLOBIN: 9.8 g/dL — AB (ref 12.0–16.0)
MCH: 29 pg (ref 26.0–34.0)
MCHC: 34.8 g/dL (ref 32.0–36.0)
MCV: 83.4 fL (ref 80.0–100.0)
PLATELETS: 357 10*3/uL (ref 150–440)
RBC: 3.38 MIL/uL — AB (ref 3.80–5.20)
RDW: 16.6 % — ABNORMAL HIGH (ref 11.5–14.5)
WBC: 8.4 10*3/uL (ref 3.6–11.0)

## 2018-01-20 LAB — DIGOXIN LEVEL: Digoxin Level: 0.8 ng/mL (ref 0.8–2.0)

## 2018-01-20 MED ORDER — ALPRAZOLAM 0.5 MG PO TABS
0.2500 mg | ORAL_TABLET | Freq: Three times a day (TID) | ORAL | Status: DC | PRN
  Administered 2018-01-20: 0.25 mg via ORAL
  Filled 2018-01-20: qty 1

## 2018-01-20 MED ORDER — POTASSIUM CHLORIDE CRYS ER 20 MEQ PO TBCR
40.0000 meq | EXTENDED_RELEASE_TABLET | Freq: Once | ORAL | Status: AC
  Administered 2018-01-20: 40 meq via ORAL
  Filled 2018-01-20: qty 2

## 2018-01-20 NOTE — Progress Notes (Signed)
[facility] [place] NAME: Jody [name]    MR#:  [id number]  DATE OF BIRTH:  1940-11-25  SUBJECTIVE:  CHIEF COMPLAINT:   Chief Complaint  Patient presents with  . Abdominal Pain  . Emesis  . Atrial Fibrillation   -Remains alert today.  Still significantly weak and requiring 2+ assistance to get to the chair.  REVIEW OF SYSTEMS:  Review of Systems  Constitutional: Positive for malaise/fatigue and weight loss. Negative for chills and fever.  HENT: Negative for congestion, ear discharge, hearing loss and nosebleeds.   Eyes: Negative for blurred vision and double vision.  Respiratory: Negative for cough, shortness of breath and wheezing.   Cardiovascular: Negative for chest pain and palpitations.  Gastrointestinal: Positive for abdominal pain. Negative for constipation, diarrhea, nausea and vomiting.  Genitourinary: Negative for dysuria.  Musculoskeletal: Negative for myalgias.  Neurological: Negative for dizziness, speech change, focal weakness, seizures, weakness and headaches.  Psychiatric/Behavioral: Negative for depression.    DRUG ALLERGIES:   Allergies  Allergen Reactions  . Poison Oak Extract Other (See Comments)    blistering  . Ampicillin Rash  . Penicillin V Potassium Rash    Has patient had a PCN reaction causing immediate rash, facial/tongue/throat swelling, SOB or lightheadedness with hypotension: No Has patient had a PCN reaction causing severe rash involving mucus membranes or skin necrosis: No Has patient had a PCN reaction that required hospitalization: No Has patient had a PCN reaction occurring within the last 10 years: No If all of the above answers are "NO", then may proceed with Cephalosporin use.     VITALS:  Blood pressure 102/70, pulse (!) 109, temperature 97.7 F (36.5 C), temperature source Axillary, resp. rate (!) 24, height 4\' 11"  (1.499 m), weight 69.8 kg, SpO2 99 %.  PHYSICAL EXAMINATION:    Physical Exam  GENERAL:  77 y.o.-year-old patient lying in the bed with no acute distress.  EYES: Pupils equal, round, reactive to light and accommodation. No scleral icterus. Extraocular muscles intact.  HEENT: Head atraumatic, normocephalic. Oropharynx and nasopharynx clear.  NECK:  Supple, no jugular venous distention. No thyroid enlargement, no tenderness.  LUNGS: Normal breath sounds bilaterally, no wheezing,  rhonchi or crepitation. No use of accessory muscles of respiration.  Bibasilar coarse Rales CARDIOVASCULAR: S1, S2 normal. No  rubs, or gallops.  2/6 systolic murmur is present ABDOMEN: Soft, nontender, nondistended.  Left lower quadrant colostomy bag in place.  Bowel sounds present. No organomegaly or mass.  EXTREMITIES: No pedal edema, cyanosis, or clubbing.  NEUROLOGIC: Cranial nerves II through XII are intact. Muscle strength 5/5 in all extremities. Sensation intact. Gait not checked.  Global weakness noted.  Following simple commands PSYCHIATRIC: The patient is alert, oriented x2 today SKIN: No obvious rash, lesion, or ulcer.    LABORATORY PANEL:   CBC Recent Labs  Lab 01/20/18 0943  WBC 8.4  HGB 9.8*  HCT 28.2*  PLT 357   ------------------------------------------------------------------------------------------------------------------  Chemistries  Recent Labs  Lab 01/18/18 0537  01/20/18 0943  NA  --    < > 141  K 3.2*   < > 3.6  CL  --    < > 104  CO2  --    < > 27  GLUCOSE  --    < > 156*  BUN  --    < > 28*  CREATININE  --    < > 0.88  CALCIUM  --    < > 8.9  MG 2.0  --   --   AST  --   --  23  ALT  --   --  41  ALKPHOS  --   --  112  BILITOT  --   --  1.1   < > = values in this interval not displayed.   ------------------------------------------------------------------------------------------------------------------  Cardiac Enzymes No results for input(s): TROPONINI in the last 168  hours. ------------------------------------------------------------------------------------------------------------------  RADIOLOGY:  No results found.  EKG:   Orders placed or performed during the hospital encounter of 12/27/17  . EKG 12-Lead  . EKG 12-Lead  . ED EKG within 10 minutes  . ED EKG within 10 minutes    ASSESSMENT AND PLAN:   Female with past medical history significant for A. fib on Eliquis, history of lung cancer, hypertension and diabetes brought to hospital secondary to abdominal pain and noted to have perforated bowels  1.  Perforated bowels-secondary to diverticulitis.  Appreciate surgical input. -Patient is status post laparotomy, left colon resection, abdominal washout and colostomy placement.  Hemoglobin is stable -Was on TPN, speech therapy upgraded her diet to mechanical soft diet with nectar thick liquids. -Tapered off TPN.. -Added Megace to improve her oral intake  2.  Acute systolic CHF with EF of 40 to 45%.  Also has diastolic dysfunction. - Not on any antibiotics at this time.  Chest x-ray showing pulmonary vascular congestion mild and also bibasilar atelectasis -On oral Lasix now-still sounds congested.-On room air now.  3.  A. fib with RVR-  on oral amiodarone.  Also on digoxin, Cardizem and metoprolol. -Adjust medications if blood pressure is stable -on  Eliquis for anticoagulation  4.  Acute delirium-  palliative care added Remeron.  Slowly improving, -almost back to baseline today.  Discontinue risperidone -Continue to monitor  5.  DVT prophylaxis-on Eliquis  Physical therapy consulted- recommending SNF Husband updated at bedside-family wants to take her home if possible.  Encouraging nursing and staff to work with patient throughout the day. Anticipate discharge to Sunday   All the records are reviewed and case discussed with Care Management/Social Workerr. Management plans discussed with the patient, family and they are in  agreement.  CODE STATUS: DNR  TOTAL TIME TAKING CARE OF THIS PATIENT: 36 minutes.   POSSIBLE D/C IN 1-2 DAYS, DEPENDING ON CLINICAL CONDITION.   Gladstone Lighter M.D on 01/20/2018 at 11:52 AM  Between 7am to 6pm - Pager - [phone]  After 6pm go to [email or web address] - password [email or web address] [facility]  Office  [phone]  CC: Primary care physician; Baxter Hire, MD

## 2018-01-20 NOTE — Clinical Social Work Note (Signed)
CSW spoke with husband and patient in patient's room this morning. CSW was inquiring if a decision had been made regarding taking her home versus having her go to rehab at discharge. Patient's husband stated he is going to take her home if that is okay with the physician. CSW stated that the physician could say yes or no but in the end, it was his decision and that he needed to decide if he could manage her in her current state. Patient's husband stated that he could. Shela Leff MSW,LCSW [phone]

## 2018-01-20 NOTE — Progress Notes (Signed)
Took her am pills without becoming SOB as she did yesterday.  Drank the full Nepro.  Ate 3-4 bites of egg. 4-5 bites of toast. 2-3 bites of muffin, and one bit of magic cup.

## 2018-01-20 NOTE — Progress Notes (Signed)
Physical Therapy Treatment Patient Details Name: Jody [name] MRN: [id number] DOB: 03-13-1941 Today's Date: 01/20/2018    History of Present Illness Pt is a 77 y.o. female presenting to hospital 12/27/17 d/t concerns of abdominal pain, nausea, and emesis; imaging showing perforated bowel (secondary diverticulitis).  Pt also with acute toxic metabolic encephalopathy, acute respiratory failure with hypoxia, a-fib with RVR, and acute anasarca.  Pt initially evaluated by PT 01/04/18.  S/p exploratory laparatomy, lysis of adhesions, abdominal washout, splenic flexure takedown and L colon resection with colostomy 01/05/18.  No new PT order received post op (pt intubated initially and therapy services discontinued).  Extubated 01/07/18.  New PT order received 01/17/18.  PMH includes moderate sized hiatal hernia, a-fib, lung CA, htn, anxiety, depression.    PT Comments    Pt continues to need mod assist 2+ for bed mobility transfers. Pt performed 4 sit to stands mod assist 2+, tolerating standing for roughly 10 seconds. Pt becomes very anxious with standing, and HR and RR increase acutely. Pt SpO2 remains above 90% on room air during all activities however. Pt was able to laterally step 2-3 steps but required max assist 2+ to do so and verbalized feeling very unstable. Pt struggles to shift hips anteriorly and leans posteriorly when in standing.  Used lateral scoot transfer mod assist 2+ to transfer pt to recliner. Pt will continue tobenefit from skilled PT to address above deficits and promote optimal return to PLOF. Continue to recommend transition to STR upon discharge from acute hospitalization.  Follow Up Recommendations  SNF     Equipment Recommendations  Rolling walker with 5" wheels    Recommendations for Other Services       Precautions / Restrictions Precautions Precautions: Fall Precaution Comments: ostomy LUQ; TPN; abdominal wound/incision Restrictions Weight Bearing Restrictions: No     Mobility  Bed Mobility Overal bed mobility: Needs Assistance Bed Mobility: Supine to Sit     Supine to sit: Mod assist Sit to supine: Mod assist   General bed mobility comments: assist for trunk and B LE's  Transfers Overall transfer level: Needs assistance Equipment used: Rolling walker (2 wheeled) Transfers: Sit to/from Stand;Lateral/Scoot Transfers Sit to Stand: Mod assist;+2 physical assistance        Lateral/Scoot Transfers: +2 physical assistance;Mod assist General transfer comment: Pt is very anxious about standing, and HR and RR elevate acutely with sit to stand. Pt was only able to tolerate standing ~10 seconds each sit to stadn trial (x 4). Pt was able to provide mod help from BUE for scoot transfer to recliner, but pt still very anxious about actvity.    Ambulation/Gait             General Gait Details: Pt able to laterally step with max assist 2+ no more than 1-2 feet.    Stairs             Wheelchair Mobility    Modified Rankin (Stroke Patients Only)       Balance Overall balance assessment: Needs assistance Sitting-balance support: Feet supported;Single extremity supported Sitting balance-Leahy Scale: Poor Sitting balance - Comments: requires at least single UE support for static sitting balance Postural control: Posterior lean Standing balance support: Bilateral upper extremity supported Standing balance-Leahy Scale: Poor Standing balance comment: Pt requires mod assist 2+ to maintain standing. Is unable to tolerate more than 10 secs.  Cognition Arousal/Alertness: Awake/alert Behavior During Therapy: WFL for tasks assessed/performed Overall Cognitive Status: Impaired/Different from baseline Area of Impairment: Following commands;Orientation;Problem solving                 Orientation Level: Disoriented to;Place;Time;Situation   Memory: Decreased short-term memory Following Commands:  Follows multi-step commands inconsistently;Follows multi-step commands with increased time     Problem Solving: Slow processing;Requires tactile cues;Requires verbal cues;Difficulty sequencing General Comments: Pt slow to process commands, Pt struggles to understand occasionally, and will ask PT to repeat.       Exercises General Exercises - Lower Extremity Ankle Circles/Pumps: 10 reps;Both;AROM Straight Leg Raises: AROM;Both;10 reps Other Exercises Other Exercises: Bed mobility: Supine to/from sit mod assist 2+; Trasnfers: Sit to stand mod assist 2+; Lateral scoot trasnfer to recliner mod assist 2+    General Comments        Pertinent Vitals/Pain      Home Living                      Prior Function            PT Goals (current goals can now be found in the care plan section) Progress towards PT goals: Progressing toward goals    Frequency    Min 2X/week      PT Plan Current plan remains appropriate    Co-evaluation              AM-PAC PT "6 Clicks" Daily Activity  Outcome Measure  Difficulty turning over in bed (including adjusting bedclothes, sheets and blankets)?: Unable Difficulty moving from lying on back to sitting on the side of the bed? : Unable Difficulty sitting down on and standing up from a chair with arms (e.g., wheelchair, bedside commode, etc,.)?: Unable Help needed moving to and from a bed to chair (including a wheelchair)?: Total Help needed walking in hospital room?: Total Help needed climbing 3-5 steps with a railing? : Total 6 Click Score: 6    End of Session Equipment Utilized During Treatment: Gait belt(Above ostomy and incision) Activity Tolerance: Patient limited by fatigue Patient left: in chair;with call bell/phone within reach;with chair alarm set;with family/visitor present;Other (comment) Nurse Communication: Mobility status;Need for lift equipment;Other (comment)(Need for hoyer lift) PT Visit Diagnosis: Other  abnormalities of gait and mobility (R26.89);Muscle weakness (generalized) (M62.81);Difficulty in walking, not elsewhere classified (R26.2)     Time: 0321-2248 PT Time Calculation (min) (ACUTE ONLY): 27 min  Charges:                       Hortencia Conradi, SPT 01/20/18,4:39 PM

## 2018-01-20 NOTE — Care Management Important Message (Signed)
Copy of signed IM left with patient and husband in room.

## 2018-01-21 LAB — GLUCOSE, CAPILLARY: GLUCOSE-CAPILLARY: 123 mg/dL — AB (ref 70–99)

## 2018-01-21 NOTE — Plan of Care (Signed)
  Problem: Education: Goal: Knowledge of General Education information will improve Description Including pain rating scale, medication(s)/side effects and non-pharmacologic comfort measures Outcome: Progressing   Problem: Clinical Measurements: Goal: Will remain free from infection Outcome: Progressing   Problem: Coping: Goal: Level of anxiety will decrease Outcome: Progressing   Problem: Pain Managment: Goal: General experience of comfort will improve Outcome: Progressing   Problem: Safety: Goal: Ability to remain free from injury will improve Outcome: Progressing   Problem: Skin Integrity: Goal: Risk for impaired skin integrity will decrease Outcome: Progressing

## 2018-01-21 NOTE — Progress Notes (Signed)
[facility] [place] NAME: Jody [name]    MR#:  [id number]  DATE OF BIRTH:  11/17/1940  SUBJECTIVE:  CHIEF COMPLAINT:   Chief Complaint  Patient presents with  . Abdominal Pain  . Emesis  . Atrial Fibrillation   -Sleepy today.  But arousable pleasantly confused.  Denies any pain.  Anxious.  Remains weak and 2+ assist  REVIEW OF SYSTEMS:  Review of Systems  Constitutional: Positive for malaise/fatigue and weight loss. Negative for chills and fever.  HENT: Negative for congestion, ear discharge, hearing loss and nosebleeds.   Eyes: Negative for blurred vision and double vision.  Respiratory: Negative for cough, shortness of breath and wheezing.   Cardiovascular: Negative for chest pain and palpitations.  Gastrointestinal: Positive for abdominal pain. Negative for constipation, diarrhea, nausea and vomiting.  Genitourinary: Negative for dysuria.  Musculoskeletal: Negative for myalgias.  Neurological: Negative for dizziness, speech change, focal weakness, seizures, weakness and headaches.  Psychiatric/Behavioral: Negative for depression.    DRUG ALLERGIES:   Allergies  Allergen Reactions  . Poison Oak Extract Other (See Comments)    blistering  . Ampicillin Rash  . Penicillin V Potassium Rash    Has patient had a PCN reaction causing immediate rash, facial/tongue/throat swelling, SOB or lightheadedness with hypotension: No Has patient had a PCN reaction causing severe rash involving mucus membranes or skin necrosis: No Has patient had a PCN reaction that required hospitalization: No Has patient had a PCN reaction occurring within the last 10 years: No If all of the above answers are "NO", then may proceed with Cephalosporin use.     VITALS:  Blood pressure (!) 109/58, pulse 93, temperature 98.4 F (36.9 C), resp. rate (!) 21, height 4\' 11"  (1.499 m), weight 68.9 kg, SpO2 99 %.  PHYSICAL EXAMINATION:  Physical  Exam  GENERAL:  77 y.o.-year-old patient lying in the bed with no acute distress.  EYES: Pupils equal, round, reactive to light and accommodation. No scleral icterus. Extraocular muscles intact.  HEENT: Head atraumatic, normocephalic. Oropharynx and nasopharynx clear.  NECK:  Supple, no jugular venous distention. No thyroid enlargement, no tenderness.  LUNGS: Normal breath sounds bilaterally, no wheezing,  rhonchi or crepitation. No use of accessory muscles of respiration.  Bibasilar coarse Rales CARDIOVASCULAR: S1, S2 normal. No  rubs, or gallops.  2/6 systolic murmur is present ABDOMEN: Soft, nontender, nondistended.  Left lower quadrant colostomy bag in place.  Bowel sounds present. No organomegaly or mass.  EXTREMITIES: No pedal edema, cyanosis, or clubbing.  NEUROLOGIC: Cranial nerves II through XII are intact. Muscle strength 5/5 in all extremities. Sensation intact. Gait not checked.  Global weakness noted.  Following simple commands PSYCHIATRIC: The patient is sleepy today SKIN: No obvious rash, lesion, or ulcer.    LABORATORY PANEL:   CBC Recent Labs  Lab 01/20/18 0943  WBC 8.4  HGB 9.8*  HCT 28.2*  PLT 357   ------------------------------------------------------------------------------------------------------------------  Chemistries  Recent Labs  Lab 01/18/18 0537  01/20/18 0943  NA  --    < > 141  K 3.2*   < > 3.6  CL  --    < > 104  CO2  --    < > 27  GLUCOSE  --    < > 156*  BUN  --    < > 28*  CREATININE  --    < > 0.88  CALCIUM  --    < > 8.9  MG 2.0  --   --  AST  --   --  23  ALT  --   --  41  ALKPHOS  --   --  112  BILITOT  --   --  1.1   < > = values in this interval not displayed.   ------------------------------------------------------------------------------------------------------------------  Cardiac Enzymes No results for input(s): TROPONINI in the last 168  hours. ------------------------------------------------------------------------------------------------------------------  RADIOLOGY:  No results found.  EKG:   Orders placed or performed during the hospital encounter of 12/27/17  . EKG 12-Lead  . EKG 12-Lead  . ED EKG within 10 minutes  . ED EKG within 10 minutes    ASSESSMENT AND PLAN:   Female with past medical history significant for A. fib on Eliquis, history of lung cancer, hypertension and diabetes brought to hospital secondary to abdominal pain and noted to have perforated bowels  1.  Perforated bowels-secondary to diverticulitis.  Appreciate surgical input. -Patient is status post laparotomy, left colon resection, abdominal washout and colostomy placement.  Hemoglobin is stable -off TPN, on mechanical soft diet with nectar thick liquids. -Added Megace to improve her oral intake  2.  Acute systolic CHF with EF of 40 to 45%.  Also has diastolic dysfunction. - Not on any antibiotics at this time.  Chest x-ray showing pulmonary vascular congestion mild and also bibasilar atelectasis -On oral Lasix  -On room air now.  3.  A. fib with RVR-  on oral amiodarone- decrease to 200mg  BID at discharge. -  Also on digoxin, Cardizem and metoprolol. -Adjust medications if blood pressure is stable -on  Eliquis for anticoagulation  4.  Acute delirium-  Resolved, removed remeron today as very sleepy  -Continue to monitor  5.  DVT prophylaxis-on Eliquis  Physical therapy consulted- recommending SNF Husband updated at bedside-family wants to take her home if possible.  - Encouraging nursing and staff to work with patient throughout the day. Anticipate discharge tomorrow   All the records are reviewed and case discussed with Care Management/Social Workerr. Management plans discussed with the patient, family and they are in agreement.  CODE STATUS: DNR  TOTAL TIME TAKING CARE OF THIS PATIENT: 37 minutes.   POSSIBLE D/C TOMORROW,  DEPENDING ON CLINICAL CONDITION.   Gladstone Lighter M.D on 01/21/2018 at 10:18 AM  Between 7am to 6pm - Pager - [phone]  After 6pm go to [email or web address] - password [email or web address] [facility]  Office  [phone]  CC: Primary care physician; Baxter Hire, MD

## 2018-01-22 LAB — BASIC METABOLIC PANEL
Anion gap: 7 (ref 5–15)
BUN: 28 mg/dL — AB (ref 8–23)
CALCIUM: 8.8 mg/dL — AB (ref 8.9–10.3)
CO2: 28 mmol/L (ref 22–32)
Chloride: 106 mmol/L (ref 98–111)
Creatinine, Ser: 0.93 mg/dL (ref 0.44–1.00)
GFR calc Af Amer: >60 mL/min (ref >60)
GFR, EST NON AFRICAN AMERICAN: 57 mL/min — AB (ref >60)
Glucose, Bld: 103 mg/dL — ABNORMAL HIGH (ref 70–99)
POTASSIUM: 4 mmol/L (ref 3.5–5.1)
Sodium: 141 mmol/L (ref 135–145)

## 2018-01-22 MED ORDER — DIGOXIN 125 MCG PO TABS: 0.1250 mg | ORAL_TABLET | Freq: Every day | ORAL | 0 refills | Status: DC

## 2018-01-22 MED ORDER — NEPRO/CARBSTEADY PO LIQD: 237.0000 mL | Freq: Two times a day (BID) | ORAL | 0 refills | Status: AC

## 2018-01-22 MED ORDER — AMIODARONE HCL 200 MG PO TABS: 200.0000 mg | ORAL_TABLET | Freq: Two times a day (BID) | ORAL | 1 refills | Status: DC

## 2018-01-22 MED ORDER — MEGESTROL ACETATE 400 MG/10ML PO SUSP: 400.0000 mg | Freq: Every day | ORAL | 0 refills | Status: AC

## 2018-01-22 NOTE — Progress Notes (Signed)
Pt awake alert and oriented. No pain no nausea.colostomy functioning. Bag appliance changed. picc line removed. Site dressed with op site. Pt tolerated these well. Pt to be discharged to home this am. disch instrutions, prescrips and portable dnr sheet given to husband.

## 2018-01-22 NOTE — Plan of Care (Signed)
  Problem: Education: Goal: Knowledge of General Education information will improve Description Including pain rating scale, medication(s)/side effects and non-pharmacologic comfort measures Outcome: Progressing   Problem: Clinical Measurements: Goal: Will remain free from infection Outcome: Progressing   Problem: Activity: Goal: Risk for activity intolerance will decrease Outcome: Progressing   Problem: Coping: Goal: Level of anxiety will decrease Outcome: Progressing   Problem: Pain Managment: Goal: General experience of comfort will improve Outcome: Progressing   Problem: Safety: Goal: Ability to remain free from injury will improve Outcome: Progressing   

## 2018-01-22 NOTE — Progress Notes (Signed)
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  She need to adjust her position frequently in the bed and have to have her head of the bed elevated at least at 30 degrees to prevent shortness of breath. A regular bed cannot help with this, so she will need a hospital bed

## 2018-01-22 NOTE — Care Management Note (Signed)
Case Management Note  Patient Details  Name: Jody [name] MRN: [id number] Date of Birth: October 01, 1940  Subjective/Objective:   Patient to be discharged per MD order. Orders in place for home health services. Previous RNCM had began workup for discharge with home [facility]. Referral confirmed with Jermaine from [facility] who accepts the patient for PT and  nursing services. No DME needs. Husband to provide discharge transport.  Ines Bloomer RN BSN RNCM [phone]                    Action/Plan:   Expected Discharge Date:  01/22/18               Expected Discharge Plan:  [place]  In-House Referral:     Discharge planning Services  CM Consult  Post Acute Care Choice:  Home Health Choice offered to:  Patient  DME Arranged:    DME Agency:     HH Arranged:  RN, PT [place] Agency:  [facility]  Status of Service:  Completed, signed off  If discussed at [place] of Stay Meetings, dates discussed:    Additional Comments:  Latanya Maudlin, RN 01/22/2018, 10:11 AM

## 2018-01-22 NOTE — Discharge Summary (Signed)
[facility] [place] NAME: Jody [name]    MR#:  [id number]  DATE OF BIRTH:  02-13-1941  DATE OF ADMISSION:  12/27/2017   ADMITTING PHYSICIAN: Hillary Bow, MD  DATE OF DISCHARGE: 01/22/2018 12:45 PM  PRIMARY CARE PHYSICIAN: Baxter Hire, MD   ADMISSION DIAGNOSIS:   Lactic acidosis [E87.2] Perforation bowel ([place]) [K63.1] A-fib ([place]) [I48.91] Atrial fibrillation with RVR ([place]) [I48.91] Abdominal pain, unspecified abdominal location [R10.9]  DISCHARGE DIAGNOSIS:   Active Problems:   Benign essential HTN   Atrial fibrillation, chronic (HCC)   Chemical diabetes   Borderline diabetes mellitus   Diverticulitis of colon with perforation   Atrial fibrillation with RVR (HCC)   Acute delirium   Goals of care, counseling/discussion   Palliative care encounter   Perforation bowel ([place])   Poor appetite   SECONDARY DIAGNOSIS:   Past Medical History:  Diagnosis Date  . Asthma   . Atrial fibrillation ([place])   . CHF (congestive heart failure) ([place])   . Lung cancer ([place])    10 yrs ago    HOSPITAL COURSE:   77 y/o Female with past medical history significant for A. fib on Eliquis, history of lung cancer, hypertension and diabetes brought to hospital secondary to abdominal pain and noted to have perforated bowels  1.  Perforated bowels-secondary to diverticulitis.  Appreciate surgical input. -Patient is status post laparotomy, left colon resection, abdominal washout and colostomy placement.  Hemoglobin is stable -off TPN, on mechanical soft diet with nectar thick liquids. -Added Megace to improve her oral intake  2.  Acute systolic CHF with EF of 40 to 45%.  Also has diastolic dysfunction. - Not on any antibiotics at this time.  Chest x-ray showing pulmonary vascular congestion mild and also bibasilar atelectasis -On oral Lasix  -On room air now.  3.  A. fib with RVR-  on oral amiodarone- decreased to 200mg  BID at  discharge. -  Also on digoxin, Cardizem and metoprolol.  Heart rate is better controlled - blood pressure is stable -on  Eliquis for anticoagulation  4.  Acute delirium-  Resolved,  -Discontinued risperidone and Remeron. -Continue to monitor   Physical therapy consulted- recommending SNF Husband updated at bedside-family wants to take her home if possible.  - Encouraging nursing and staff to work with patient -  -Patient has been more alert since last evening.  Physically more stronger with bed exercises as well.  Family comfortable taking her home.  We will arrange home health as high risk for readmission.  DISCHARGE CONDITIONS:   [place]  CONSULTS OBTAINED:   Treatment Team:  Yolonda Kida, MD Tommy Medal, Lavell Islam, MD Ubaldo Glassing Javier Docker, MD Gladstone Lighter, MD  DRUG ALLERGIES:   Allergies  Allergen Reactions  . Poison Oak Extract Other (See Comments)    blistering  . Ampicillin Rash  . Penicillin V Potassium Rash    Has patient had a PCN reaction causing immediate rash, facial/tongue/throat swelling, SOB or lightheadedness with hypotension: No Has patient had a PCN reaction causing severe rash involving mucus membranes or skin necrosis: No Has patient had a PCN reaction that required hospitalization: No Has patient had a PCN reaction occurring within the last 10 years: No If all of the above answers are "NO", then may proceed with Cephalosporin use.    DISCHARGE MEDICATIONS:   Allergies as of 01/22/2018      Reactions   Poison Oak Extract Other (See Comments)  blistering   Ampicillin Rash   Penicillin V Potassium Rash   Has patient had a PCN reaction causing immediate rash, facial/tongue/throat swelling, SOB or lightheadedness with hypotension: No Has patient had a PCN reaction causing severe rash involving mucus membranes or skin necrosis: No Has patient had a PCN reaction that required hospitalization: No Has patient had a PCN reaction occurring within  the last 10 years: No If all of the above answers are "NO", then may proceed with Cephalosporin use.      Medication List    STOP taking these medications   potassium chloride 10 MEQ tablet Commonly known as:  K-DUR,KLOR-CON     TAKE these medications   alendronate 70 MG tablet Commonly known as:  FOSAMAX Take 70 mg by mouth every Monday.   ALPRAZolam 0.25 MG tablet Commonly known as:  XANAX Take 0.25 mg by mouth at bedtime as needed for anxiety or sleep.   amiodarone 200 MG tablet Commonly known as:  PACERONE Take 1 tablet (200 mg total) by mouth 2 (two) times daily.   CALCIUM 600+D 600-200 MG-UNIT Tabs Generic drug:  Calcium Carbonate-Vitamin D Take 1 tablet by mouth daily.   digoxin 0.125 MG tablet Commonly known as:  LANOXIN Take 1 tablet (0.125 mg total) by mouth daily.   diltiazem 240 MG 24 hr capsule Commonly known as:  DILACOR XR Take 240 mg by mouth daily.   ELIQUIS 5 MG Tabs tablet Generic drug:  apixaban Take 5 mg by mouth every 12 (twelve) hours.   feeding supplement (NEPRO CARB STEADY) Liqd Take 237 mLs by mouth 2 (two) times daily between meals.   FLUoxetine 20 MG capsule Commonly known as:  PROZAC Take 20 mg by mouth daily.   furosemide 20 MG tablet Commonly known as:  LASIX Take 20 mg by mouth daily.   lovastatin 20 MG tablet Commonly known as:  MEVACOR Take 20 mg by mouth daily with supper.   megestrol 400 MG/10ML suspension Commonly known as:  MEGACE Take 10 mLs (400 mg total) by mouth daily.   metoprolol succinate 50 MG 24 hr tablet Commonly known as:  TOPROL-XL Take 1 tablet (50 mg total) by mouth daily. Take with or immediately following a meal. What changed:    when to take this  additional instructions   montelukast 10 MG tablet Commonly known as:  SINGULAIR Take 10 mg by mouth at bedtime.   MULTI-VITAMINS Tabs Take 1 tablet by mouth daily.   omeprazole 20 MG capsule Commonly known as:  PRILOSEC Take 20 mg by mouth  daily.        DISCHARGE INSTRUCTIONS:   1. PCP f/u in 1-2 weeks 2.  General surgery follow-up in 2 weeks 3.  Cardiology follow-up in 2 to 3 weeks  DIET:   Cardiac diet  ACTIVITY:   Activity as tolerated  OXYGEN:   Home Oxygen: No.  Oxygen Delivery: room air  DISCHARGE LOCATION:   home   If you experience worsening of your admission symptoms, develop shortness of breath, life threatening emergency, suicidal or homicidal thoughts you must seek medical attention immediately by calling 911 or calling your MD immediately  if symptoms less severe.  You Must read complete instructions/literature along with all the possible adverse reactions/side effects for all the Medicines you take and that have been prescribed to you. Take any new Medicines after you have completely understood and accpet all the possible adverse reactions/side effects.   Please note  You were cared for by  a hospitalist during your hospital stay. If you have any questions about your discharge medications or the care you received while you were in the hospital after you are discharged, you can call the unit and asked to speak with the hospitalist on call if the hospitalist that took care of you is not available. Once you are discharged, your primary care physician will handle any further medical issues. Please note that NO REFILLS for any discharge medications will be authorized once you are discharged, as it is imperative that you return to your primary care physician (or establish a relationship with a primary care physician if you do not have one) for your aftercare needs so that they can reassess your need for medications and monitor your lab values.    On the day of Discharge:  VITAL SIGNS:   Blood pressure (!) 104/59, pulse 88, temperature 98 F (36.7 C), temperature source Oral, resp. rate 18, height 4\' 11"  (1.499 m), weight 67.9 kg, SpO2 99 %.  PHYSICAL EXAMINATION:   GENERAL:  77 y.o.-year-old  patient lying in the bed with no acute distress.  EYES: Pupils equal, round, reactive to light and accommodation. No scleral icterus. Extraocular muscles intact.  HEENT: Head atraumatic, normocephalic. Oropharynx and nasopharynx clear.  NECK:  Supple, no jugular venous distention. No thyroid enlargement, no tenderness.  LUNGS: Normal breath sounds bilaterally, no wheezing,  rhonchi or crepitation. No use of accessory muscles of respiration.  Bibasilar coarse Rales CARDIOVASCULAR: S1, S2 normal. No  rubs, or gallops.  2/6 systolic murmur is present ABDOMEN: Soft, nontender, nondistended.  Left lower quadrant colostomy bag in place.  Bowel sounds present. No organomegaly or mass.  EXTREMITIES: No pedal edema, cyanosis, or clubbing.  NEUROLOGIC: Cranial nerves II through XII are intact. Muscle strength 5/5 in all extremities. Sensation intact. Gait not checked.  Global weakness noted.  Following simple commands PSYCHIATRIC: The patient is alert and oriented x 3 SKIN: No obvious rash, lesion, or ulcer.   DATA REVIEW:   CBC Recent Labs  Lab 01/20/18 0943  WBC 8.4  HGB 9.8*  HCT 28.2*  PLT 357    Chemistries  Recent Labs  Lab 01/18/18 0537  01/20/18 0943 01/22/18 0502  NA  --    < > 141 141  K 3.2*   < > 3.6 4.0  CL  --    < > 104 106  CO2  --    < > 27 28  GLUCOSE  --    < > 156* 103*  BUN  --    < > 28* 28*  CREATININE  --    < > 0.88 0.93  CALCIUM  --    < > 8.9 8.8*  MG 2.0  --   --   --   AST  --   --  23  --   ALT  --   --  41  --   ALKPHOS  --   --  112  --   BILITOT  --   --  1.1  --    < > = values in this interval not displayed.     Microbiology Results  Results for orders placed or performed during the hospital encounter of 12/27/17  Blood culture (routine x 2)     Status: None   Collection Time: 12/27/17 11:04 AM  Result Value Ref Range Status   Specimen Description BLOOD LEFT [facility]  Final   Special Requests   Final    BOTTLES  DRAWN AEROBIC AND ANAEROBIC Blood  Culture adequate volume   Culture   Final    NO GROWTH 5 DAYS Performed at [facility], [address] [place], [place] [address]    Report Status 01/01/2018 FINAL  Final  Blood culture (routine x 2)     Status: None   Collection Time: 12/27/17 11:04 AM  Result Value Ref Range Status   Specimen Description BLOOD LEFT FA  Final   Special Requests   Final    BOTTLES DRAWN AEROBIC AND ANAEROBIC Blood Culture adequate volume   Culture   Final    NO GROWTH 5 DAYS Performed at [facility], [address] [place], [place] [address]    Report Status 01/01/2018 FINAL  Final  MRSA PCR Screening     Status: None   Collection Time: 12/27/17 12:55 PM  Result Value Ref Range Status   MRSA by PCR NEGATIVE NEGATIVE Final    Comment:        The GeneXpert MRSA Assay (FDA approved for NASAL specimens only), is one component of a comprehensive MRSA colonization surveillance program. It is not intended to diagnose MRSA infection nor to guide or monitor treatment for MRSA infections. Performed at [facility], [address], [place], [place] [address]     RADIOLOGY:  No results found.   Management plans discussed with the patient, family and they are in agreement.  CODE STATUS:     Code Status Orders  (From admission, onward)         Start     Ordered   12/27/17 1602  Do not attempt resuscitation (DNR)  Continuous    Question Answer Comment  In the event of cardiac or respiratory ARREST Do not call a "code blue"   In the event of cardiac or respiratory ARREST Do not perform Intubation, CPR, defibrillation or ACLS   In the event of cardiac or respiratory ARREST Use medication by any route, position, wound care, and other measures to relive pain and suffering. May use oxygen, suction and manual treatment of airway obstruction as needed for comfort.      12/27/17 1601        Code Status History    Date Active Date Inactive Code Status Order ID  Comments User Context   12/27/2017 1204 12/27/2017 1601 Full Code [id number]  Benjamine Sprague, DO ED   12/27/2017 1204 12/27/2017 1204 Full Code [id number]  Benjamine Sprague, DO ED    Advance Directive Documentation     Most Recent Value  Type of Advance Directive  Living will  Pre-existing out of facility DNR order (yellow form or pink MOST form)  -  "MOST" Form in Place?  -     minutes.   TOTAL TIME TAKING CARE OF THIS PATIENT: 68 minutes  Rashard Ryle M.D on 01/22/2018 at 3:02 PM  Between 7am to 6pm - Pager - [phone]  After 6pm go to [email or web address] - Proofreader  [employer] [facility]  Office  [phone]  CC: Primary care physician; Baxter Hire, MD   Note: This dictation was prepared with Dragon dictation along with smaller phrase technology. Any transcriptional errors that result from this process are unintentional.

## 2018-01-25 ENCOUNTER — Telehealth: Payer: Self-pay

## 2018-01-25 NOTE — Telephone Encounter (Signed)
EMMI Follow-up: Received a voice message from the patients husband but when I returned the call no voice mail was set up.  Will see if they call again.

## 2018-03-30 ENCOUNTER — Emergency Department: Payer: Medicare Other

## 2018-03-30 ENCOUNTER — Emergency Department
Admission: EM | Admit: 2018-03-30 | Discharge: 2018-03-30 | Disposition: A | Discharge status: Home / Self Care | Payer: Medicare Other | Attending: Emergency Medicine | Admitting: Emergency Medicine

## 2018-03-30 ENCOUNTER — Other Ambulatory Visit: Payer: Self-pay

## 2018-03-30 DIAGNOSIS — I110 Hypertensive heart disease with heart failure: Secondary | ICD-10-CM | POA: Insufficient documentation

## 2018-03-30 DIAGNOSIS — J45909 Unspecified asthma, uncomplicated: Secondary | ICD-10-CM | POA: Insufficient documentation

## 2018-03-30 DIAGNOSIS — E860 Dehydration: Secondary | ICD-10-CM | POA: Diagnosis not present

## 2018-03-30 DIAGNOSIS — Z79899 Other long term (current) drug therapy: Secondary | ICD-10-CM | POA: Diagnosis not present

## 2018-03-30 DIAGNOSIS — Z85118 Personal history of other malignant neoplasm of bronchus and lung: Secondary | ICD-10-CM | POA: Insufficient documentation

## 2018-03-30 DIAGNOSIS — Z7901 Long term (current) use of anticoagulants: Secondary | ICD-10-CM | POA: Diagnosis not present

## 2018-03-30 DIAGNOSIS — R112 Nausea with vomiting, unspecified: Secondary | ICD-10-CM | POA: Insufficient documentation

## 2018-03-30 DIAGNOSIS — I509 Heart failure, unspecified: Secondary | ICD-10-CM | POA: Insufficient documentation

## 2018-03-30 LAB — URINALYSIS, COMPLETE (UACMP) WITH MICROSCOPIC
BILIRUBIN URINE: NEGATIVE
Bacteria, UA: NONE SEEN
Glucose, UA: NEGATIVE mg/dL
HGB URINE DIPSTICK: NEGATIVE
KETONES UR: NEGATIVE mg/dL
LEUKOCYTES UA: NEGATIVE
Nitrite: NEGATIVE
PH: 5 (ref 5.0–8.0)
Protein, ur: 30 mg/dL — AB
SPECIFIC GRAVITY, URINE: 1.02 (ref 1.005–1.030)

## 2018-03-30 LAB — CBC
HCT: 38.4 % (ref 36.0–46.0)
HEMOGLOBIN: 12.5 g/dL (ref 12.0–15.0)
MCH: 26.7 pg (ref 26.0–34.0)
MCHC: 32.6 g/dL (ref 30.0–36.0)
MCV: 81.9 fL (ref 80.0–100.0)
NRBC: 0 % (ref 0.0–0.2)
PLATELETS: 348 10*3/uL (ref 150–400)
RBC: 4.69 MIL/uL (ref 3.87–5.11)
RDW: 16.6 % — ABNORMAL HIGH (ref 11.5–15.5)
WBC: 9.9 10*3/uL (ref 4.0–10.5)

## 2018-03-30 LAB — HEPATIC FUNCTION PANEL
ALK PHOS: 64 U/L (ref 38–126)
ALT: 26 U/L (ref 0–44)
AST: 27 U/L (ref 15–41)
Albumin: 3.9 g/dL (ref 3.5–5.0)
Total Bilirubin: 1.1 mg/dL (ref 0.3–1.2)
Total Protein: 7.6 g/dL (ref 6.5–8.1)

## 2018-03-30 LAB — BASIC METABOLIC PANEL
ANION GAP: 10 (ref 5–15)
BUN: 13 mg/dL (ref 8–23)
CALCIUM: 9.3 mg/dL (ref 8.9–10.3)
CHLORIDE: 97 mmol/L — AB (ref 98–111)
CO2: 26 mmol/L (ref 22–32)
Creatinine, Ser: 1.28 mg/dL — ABNORMAL HIGH (ref 0.44–1.00)
GFR calc non Af Amer: 39 mL/min — ABNORMAL LOW (ref >60)
GFR, EST AFRICAN AMERICAN: 45 mL/min — AB (ref >60)
Glucose, Bld: 135 mg/dL — ABNORMAL HIGH (ref 70–99)
POTASSIUM: 3.5 mmol/L (ref 3.5–5.1)
Sodium: 133 mmol/L — ABNORMAL LOW (ref 135–145)

## 2018-03-30 LAB — TROPONIN I

## 2018-03-30 MED ORDER — ONDANSETRON HCL 4 MG/2ML IJ SOLN
4.0000 mg | Freq: Once | INTRAMUSCULAR | Status: AC
  Administered 2018-03-30: 4 mg via INTRAVENOUS

## 2018-03-30 MED ORDER — ONDANSETRON HCL 4 MG PO TABS: 4.0000 mg | ORAL_TABLET | Freq: Every day | ORAL | 0 refills | Status: DC | PRN

## 2018-03-30 MED ORDER — ONDANSETRON HCL 4 MG/2ML IJ SOLN
INTRAMUSCULAR | Status: AC
  Administered 2018-03-30: 4 mg via INTRAVENOUS
  Filled 2018-03-30: qty 2

## 2018-03-30 MED ORDER — SODIUM CHLORIDE 0.9 % IV BOLUS
1000.0000 mL | Freq: Once | INTRAVENOUS | Status: AC
  Administered 2018-03-30: 1000 mL via INTRAVENOUS

## 2018-03-30 NOTE — ED Triage Notes (Signed)
Pt in via [facility] from home with c/o general weakness, nausea and vomiting. Recently d/c from [facility] for diverticulitis and has had NV since then. Pt unable to get up from chair due to weakness. BP 114/68

## 2018-03-30 NOTE — ED Triage Notes (Signed)
Pt arrived via EMS from home with reports of weakness, nausea and vomiting for 2-3 days. Pt dx recently in the last few weeks with diverticulitis. Pt has LLQ colostomy as well.  Pt denies any pain. Pt states after she took her bath she was more weak than she had been and was unable to get up even from a chair.

## 2018-03-30 NOTE — ED Provider Notes (Addendum)
[facility] Emergency Department Provider Note  ___________________________________________   First MD Initiated Contact with Patient 03/30/18 1805     (approximate)  I have reviewed the triage vital signs and the nursing notes.   HISTORY  Chief Complaint Weakness   HPI Jody [name] is a 77 y.o. female with a history of atrial fibrillation as well as recent colostomy secondary to diverticulitis on Eliquis who was presented to the emergency department with weakness as well as nausea and vomiting.  She says that the nausea and vomiting has been ongoing over the past 2 to 3 days.  No burning with urination.  However, the patient had similar symptoms with a UTI several weeks ago.  Had improvement with treatment with Macrobid at that point but has not fully regained her strength since being admitted to the hospital for surgery.  Patient denies any abdominal pain.  Denies any focal weakness.  Says that she has had brown liquidy output from the ostomy.   Past Medical History:  Diagnosis Date  . Asthma   . Atrial fibrillation ([place])   . CHF (congestive heart failure) ([place])   . Lung cancer ([place])    10 yrs ago    Patient Active Problem List   Diagnosis Date Noted  . Perforation bowel ([place])   . Poor appetite   . Acute delirium   . Goals of care, counseling/discussion   . Palliative care encounter   . Diverticulitis of colon with perforation 12/27/2017  . Atrial fibrillation with RVR ([place]) 12/27/2017  . Malignant neoplasm of upper lobe of right lung ([place]) 12/31/2015  . 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 TI (tricuspid incompetence) 01/21/2015  . Anxiety 01/13/2015  . Clinical depression 01/13/2015  . 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 Borderline diabetes mellitus 01/13/2015  . Benign essential HTN 01/06/2015  . Essential (primary) hypertension 01/06/2015  . Atrial fibrillation, chronic ([place]) 05/20/2014  . Combined fat and carbohydrate induced hyperlipemia  05/20/2014  . Chronic atrial fibrillation 05/20/2014  . 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 Cough 11/20/2013  . Breath shortness 11/01/2013  . Breathlessness on exertion 10/30/2013    Past Surgical History:  Procedure Laterality Date  . COLOSTOMY N/A 01/05/2018   Procedure: COLOSTOMY;  Surgeon: Benjamine Sprague, DO;  Location: [facility] ORS;  Service: General;  Laterality: N/A;  . LAPAROTOMY N/A 01/05/2018   Procedure: EXPLORATORY LAPAROTOMY/ POSSIBLE BOWEL OBSTRUCTION;  Surgeon: Benjamine Sprague, DO;  Location: [facility] ORS;  Service: General;  Laterality: N/A;  . LYSIS OF ADHESION N/A 01/05/2018   Procedure: LYSIS OF ADHESION;  Surgeon: Benjamine Sprague, DO;  Location: [facility] ORS;  Service: General;  Laterality: N/A;    Prior to Admission medications   Medication Sig Start Date End Date Taking? Authorizing Provider  alendronate (FOSAMAX) 70 MG tablet Take 70 mg by mouth every Monday.     [provider]  ALPRAZolam Duanne Moron) 0.25 MG tablet Take 0.25 mg by mouth at bedtime as needed for anxiety or sleep.     [provider]  amiodarone (PACERONE) 200 MG tablet Take 1 tablet (200 mg total) by mouth 2 (two) times daily. 01/22/18   Gladstone Lighter, MD  apixaban (ELIQUIS) 5 MG TABS tablet Take 5 mg by mouth every 12 (twelve) hours.     [provider]  Calcium Carbonate-Vitamin D (CALCIUM 600+D) 600-200 MG-UNIT TABS Take 1 tablet by mouth daily.    [provider]  digoxin (LANOXIN) 0.125 MG tablet Take 1 tablet (0.125 mg total) by mouth  daily. 01/22/18   Gladstone Lighter, MD  diltiazem (DILACOR XR) 240 MG 24 hr capsule Take 240 mg by mouth daily.    [provider]  FLUoxetine (PROZAC) 20 MG capsule Take 20 mg by mouth daily.  02/20/14   [provider]  furosemide (LASIX) 20 MG tablet Take 20 mg by mouth daily.    [provider]  lovastatin (MEVACOR) 20 MG tablet Take 20 mg by mouth daily with supper.     [provider]  metoprolol  succinate (TOPROL XL) 50 MG 24 hr tablet Take 1 tablet (50 mg total) by mouth daily. Take with or immediately following a meal. Patient taking differently: Take 50 mg by mouth 2 (two) times daily.  02/10/15 12/27/17  Daymon Larsen, MD  montelukast (SINGULAIR) 10 MG tablet Take 10 mg by mouth at bedtime.  08/23/14   [provider]  Multiple Vitamin (MULTI-VITAMINS) TABS Take 1 tablet by mouth daily.     [provider]  omeprazole (PRILOSEC) 20 MG capsule Take 20 mg by mouth daily.  08/15/14   [provider]    Allergies Poison oak extract; Ampicillin; and Penicillin v potassium  Family History  Problem Relation Age of Onset  . Breast cancer Paternal Aunt     Social History Social History   Tobacco Use  . Smoking status: Never Smoker  . Smokeless tobacco: Never Used  Substance Use Topics  . Alcohol use: No  . Drug use: Not on file    Review of Systems  Constitutional: No fever/chills Eyes: No visual changes. ENT: No sore throat. Cardiovascular: Denies chest pain. Respiratory: Denies shortness of breath. Gastrointestinal: No abdominal pain.   No diarrhea.  No constipation. Genitourinary: Negative for dysuria. Musculoskeletal: Negative for back pain. Skin: Negative for rash. Neurological: Negative for headaches, focal weakness or numbness.   ____________________________________________   PHYSICAL EXAM:  VITAL SIGNS: ED Triage Vitals [03/30/18 1519]  Enc Vitals Group     BP 111/62     Pulse Rate (!) 102     Resp 18     Temp 98.4 F (36.9 C)     Temp Source Oral     SpO2 97 %     Weight 149 lb 11.1 oz (67.9 kg)     Height 4\' 11"  (1.499 m)     Head Circumference      Peak Flow      Pain Score 0     Pain Loc      Pain Edu?      Excl. in [place]?     Constitutional: Alert and oriented. in no acute distress. Eyes: Conjunctivae are normal.  Head: Atraumatic. Nose: No congestion/rhinnorhea. Mouth/Throat: Mucous membranes are moist.    Neck: No stridor.   Cardiovascular: Irregularly irregular rhythm.  Grossly normal heart sounds.  Respiratory: Normal respiratory effort.  No retractions. Lungs CTAB. Gastrointestinal: Soft and nontender. No distention.  Brown liquid stool in ostomy in the left of the abdomen. Musculoskeletal: No lower extremity tenderness nor edema.  No joint effusions. Neurologic:  Normal speech and language. No gross focal neurologic deficits are appreciated. Skin:  Skin is warm, dry and intact. No rash noted. Psychiatric: Mood and affect are normal. Speech and behavior are normal.  ____________________________________________   LABS (all labs ordered are listed, but only abnormal results are displayed)  Labs Reviewed  BASIC METABOLIC PANEL - Abnormal; Notable for the following components:      Result Value   Sodium 133 (*)  Chloride 97 (*)    Glucose, Bld 135 (*)    Creatinine, Ser 1.28 (*)    GFR calc non Af Amer 39 (*)    GFR calc Af Amer 45 (*)    All other components within normal limits  CBC - Abnormal; Notable for the following components:   RDW 16.6 (*)    All other components within normal limits  URINALYSIS, COMPLETE (UACMP) WITH MICROSCOPIC - Abnormal; Notable for the following components:   Color, Urine AMBER (*)    APPearance HAZY (*)    Protein, ur 30 (*)    All other components within normal limits  URINE CULTURE  TROPONIN I  HEPATIC FUNCTION PANEL   ____________________________________________  EKG  ED ECG REPORT I, Doran Stabler, the attending physician, personally viewed and interpreted this ECG.   Date: 03/30/2018  EKG Time: 1513  Rate: 83  Rhythm: atrial fibrillation, rate 83  Axis: Normal  Intervals:Incomplete right bundle branch block  ST&T Change: No ST segment elevation or depression.  T wave inversions in 2 3 and aVF as well as V4 through V6 which are unchanged from  previous.  ____________________________________________  RADIOLOGY  Nonobstructive bowel gas pattern on the KUB. ____________________________________________   PROCEDURES  Procedure(s) performed:   Procedures  Critical Care performed:   ____________________________________________   INITIAL IMPRESSION / ASSESSMENT AND PLAN / ED COURSE  Pertinent labs & imaging results that were available during my care of the patient were reviewed by me and considered in my medical decision making (see chart for details).  DDX: Acute kidney injury, dehydration, less light abnormality, UTI, deconditioning, ACS As part of my medical decision making, I reviewed the following data within the [facility] Notes from prior ED visits and hospital records  ----------------------------------------- 8:13 PM on 03/30/2018 -----------------------------------------  Patient at this time feeling much improved after fluids and Zofran.  Is tolerating p.o. crackers as well as juice.  Urine with few white blood cells.  However, no urinary complaints.  Patient just treated for UTI.  No frequency/burning.  Will send for culture.  Will discharge with Zofran.  Patient to follow-up with Dr. Edwina Barth.  Reassuring labs as well as abdominal flatplate.  Patient understanding of diagnosis as well as plan willing to comply.  We discussed the urine results and waiting for culture at this time. ____________________________________________   FINAL CLINICAL IMPRESSION(S) / ED DIAGNOSES  Nausea, vomiting and dehydration.  NEW MEDICATIONS STARTED DURING THIS VISIT:  New Prescriptions   No medications on file     Note:  This document was prepared using Dragon voice recognition software and may include unintentional dictation errors.     Orbie Pyo, MD 03/30/18 2014    Orbie Pyo, MD 03/30/18 2015

## 2018-04-01 LAB — URINE CULTURE

## 2018-04-03 ENCOUNTER — Other Ambulatory Visit: Payer: Self-pay | Admitting: Internal Medicine

## 2018-04-03 DIAGNOSIS — C3411 Malignant neoplasm of upper lobe, right bronchus or lung: Secondary | ICD-10-CM

## 2018-04-05 ENCOUNTER — Inpatient Hospital Stay: Payer: Medicare Other

## 2018-04-05 ENCOUNTER — Inpatient Hospital Stay: Payer: Medicare Other | Admitting: Internal Medicine

## 2018-04-05 NOTE — Progress Notes (Deleted)
[facility] OFFICE PROGRESS NOTE  Patient Care Team: Baxter Hire, MD as PCP - General (Internal Medicine)  Cancer Staging No matching staging information was found for the patient.   Oncology History   Carcinoma of lung, status post radiation therapy. Adenocarcinoma, T1N0M0. Stage I. September 2006  # CT December 29 2015- stable bilateral lung nodules; groundglass/infiltrates bilateral lower lungs     Malignant neoplasm of upper lobe of right lung ([place])   12/31/2015 Initial Diagnosis    Malignant neoplasm of upper lobe of right lung ([place])      INTERVAL HISTORY:  Jody [name] 77 y.o.  female pleasant patient above history of Stage I lung cancer is here For a follow-up.  Patient has intermittent history of vertigo; this is not getting worse.  Denies any worsening cough. Denies any worsening shortness of breath or chest pain or fevers. No nausea no vomiting. No headaches or bone pain.  REVIEW OF SYSTEMS:  A complete 10 point review of system is done which is negative except mentioned above/history of present illness.   PAST MEDICAL HISTORY :  Past Medical History:  Diagnosis Date  . Asthma   . Atrial fibrillation ([place])   . CHF (congestive heart failure) ([place])   . Lung cancer ([place])    10 yrs ago    PAST SURGICAL HISTORY :   Past Surgical History:  Procedure Laterality Date  . COLOSTOMY N/A 01/05/2018   Procedure: COLOSTOMY;  Surgeon: Benjamine Sprague, DO;  Location: [facility] ORS;  Service: General;  Laterality: N/A;  . LAPAROTOMY N/A 01/05/2018   Procedure: EXPLORATORY LAPAROTOMY/ POSSIBLE BOWEL OBSTRUCTION;  Surgeon: Benjamine Sprague, DO;  Location: [facility] ORS;  Service: General;  Laterality: N/A;  . LYSIS OF ADHESION N/A 01/05/2018   Procedure: LYSIS OF ADHESION;  Surgeon: Benjamine Sprague, DO;  Location: [facility] ORS;  Service: General;  Laterality: N/A;    FAMILY HISTORY :   Family History  Problem Relation Age of Onset  . Breast cancer Paternal Aunt     SOCIAL HISTORY:    Social History   Tobacco Use  . Smoking status: Never Smoker  . Smokeless tobacco: Never Used  Substance Use Topics  . Alcohol use: No  . Drug use: Not on file    ALLERGIES:  is allergic to poison oak extract; ampicillin; and penicillin v potassium.  MEDICATIONS:  Current Outpatient Medications  Medication Sig Dispense Refill  . alendronate (FOSAMAX) 70 MG tablet Take 70 mg by mouth every Monday.     . ALPRAZolam (XANAX) 0.25 MG tablet Take 0.25 mg by mouth at bedtime as needed for anxiety or sleep.     Marland Kitchen amiodarone (PACERONE) 200 MG tablet Take 1 tablet (200 mg total) by mouth 2 (two) times daily. 60 tablet 1  . apixaban (ELIQUIS) 5 MG TABS tablet Take 5 mg by mouth every 12 (twelve) hours.     . Calcium Carbonate-Vitamin D (CALCIUM 600+D) 600-200 MG-UNIT TABS Take 1 tablet by mouth daily.    . digoxin (LANOXIN) 0.125 MG tablet Take 1 tablet (0.125 mg total) by mouth daily. 30 tablet 0  . diltiazem (DILACOR XR) 240 MG 24 hr capsule Take 240 mg by mouth daily.    Marland Kitchen FLUoxetine (PROZAC) 20 MG capsule Take 20 mg by mouth daily.     . furosemide (LASIX) 20 MG tablet Take 20 mg by mouth daily.    Marland Kitchen lovastatin (MEVACOR) 20 MG tablet Take 20 mg by mouth daily with supper.     Marland Kitchen  metoprolol succinate (TOPROL XL) 50 MG 24 hr tablet Take 1 tablet (50 mg total) by mouth daily. Take with or immediately following a meal. (Patient taking differently: Take 50 mg by mouth 2 (two) times daily. ) 30 tablet 11  . montelukast (SINGULAIR) 10 MG tablet Take 10 mg by mouth at bedtime.     . Multiple Vitamin (MULTI-VITAMINS) TABS Take 1 tablet by mouth daily.     Marland Kitchen omeprazole (PRILOSEC) 20 MG capsule Take 20 mg by mouth daily.     . ondansetron (ZOFRAN) 4 MG tablet Take 1 tablet (4 mg total) by mouth daily as needed. 10 tablet 0   No current facility-administered medications for this visit.     PHYSICAL EXAMINATION: ECOG PERFORMANCE STATUS: 0 - Asymptomatic  There were no vitals taken for this  visit.  There were no vitals filed for this visit.  GENERAL: Well-nourished well-developed; Alert, no distress and comfortable.  Accompanied by her husband. EYES: no pallor or icterus OROPHARYNX: no thrush or ulceration; good dentition  NECK: supple, no masses felt LYMPH:  no palpable lymphadenopathy in the cervical, axillary or inguinal regions LUNGS: clear to auscultation and  No wheeze or crackles HEART/CVS: regular rate & rhythm and no murmurs; No lower extremity edema ABDOMEN:abdomen soft, non-tender and normal bowel sounds Musculoskeletal:no cyanosis of digits and no clubbing  PSYCH: alert & oriented x 3 with fluent speech NEURO: no focal motor/sensory deficits SKIN:  no rashes or significant lesions  LABORATORY DATA:  I have reviewed the data as listed    Component Value Date/Time   NA 133 (L) 03/30/2018 1522   NA 136 01/09/2014 0950   K 3.5 03/30/2018 1522   K 4.4 01/09/2014 0950   CL 97 (L) 03/30/2018 1522   CL 100 01/09/2014 0950   CO2 26 03/30/2018 1522   CO2 31 01/09/2014 0950   GLUCOSE 135 (H) 03/30/2018 1522   GLUCOSE 114 (H) 01/09/2014 0950   BUN 13 03/30/2018 1522   BUN 12 01/09/2014 0950   CREATININE 1.28 (H) 03/30/2018 1522   CREATININE 0.85 01/09/2014 0950   CALCIUM 9.3 03/30/2018 1522   CALCIUM 9.1 01/09/2014 0950   PROT 7.6 03/30/2018 1522   PROT 7.4 01/09/2014 0950   ALBUMIN 3.9 03/30/2018 1522   ALBUMIN 3.6 01/09/2014 0950   AST 27 03/30/2018 1522   AST 24 01/09/2014 0950   ALT 26 03/30/2018 1522   ALT 22 01/09/2014 0950   ALKPHOS 64 03/30/2018 1522   ALKPHOS 76 01/09/2014 0950   BILITOT 1.1 03/30/2018 1522   BILITOT 1.2 (H) 01/09/2014 0950   GFRNONAA 39 (L) 03/30/2018 1522   GFRNONAA >60 01/09/2014 0950   GFRAA 45 (L) 03/30/2018 1522   GFRAA >60 01/09/2014 0950    No results found for: SPEP, UPEP  Lab Results  Component Value Date   WBC 9.9 03/30/2018   NEUTROABS 11.9 (H) 01/12/2018   HGB 12.5 03/30/2018   HCT 38.4 03/30/2018    MCV 81.9 03/30/2018   PLT 348 03/30/2018      Chemistry      Component Value Date/Time   NA 133 (L) 03/30/2018 1522   NA 136 01/09/2014 0950   K 3.5 03/30/2018 1522   K 4.4 01/09/2014 0950   CL 97 (L) 03/30/2018 1522   CL 100 01/09/2014 0950   CO2 26 03/30/2018 1522   CO2 31 01/09/2014 0950   BUN 13 03/30/2018 1522   BUN 12 01/09/2014 0950   CREATININE 1.28 (H) 03/30/2018 1522  CREATININE 0.85 01/09/2014 0950      Component Value Date/Time   CALCIUM 9.3 03/30/2018 1522   CALCIUM 9.1 01/09/2014 0950   ALKPHOS 64 03/30/2018 1522   ALKPHOS 76 01/09/2014 0950   AST 27 03/30/2018 1522   AST 24 01/09/2014 0950   ALT 26 03/30/2018 1522   ALT 22 01/09/2014 0950   BILITOT 1.1 03/30/2018 1522   BILITOT 1.2 (H) 01/09/2014 0950       RADIOGRAPHIC STUDIES: I have personally reviewed the radiological images as listed and agreed with the findings in the report. No results found.   ASSESSMENT & PLAN:  No problem-specific Assessment & Plan notes found for this encounter.   No orders of the defined types were placed in this encounter.  All questions were answered. The patient knows to call the clinic with any problems, questions or concerns.    Faythe Casa, NP   Cammie Sickle, MD 04/05/2018 1:03 PM

## 2018-04-05 NOTE — Assessment & Plan Note (Deleted)
Stage I lung cancer status post resection 2005. Clinically no evidence of recurrence.   # Bilateral lung nodules-incidental surveillance bilateral pulmonary nodules. CT scan from 03/30/17 similar to previous scan January 2018 showing stability of numerous subcentimeter bilateral pulmonary nodules. Plan to discuss patient at [facility] Board in regards to interval and duration of screening given stability of scans.   # CT of chest w contrast in 1 year, few days later labs and follow-up with MD.

## 2018-10-21 IMAGING — DX DG ABD PORTABLE 1V
1 series · 1 of 1 positions shown · non-contrast
Comparison: CT abdomen and pelvis 01/04/2018

CLINICAL DATA: NG tube placement.

EXAM:
PORTABLE ABDOMEN - 1 VIEW

[abdomen supine]
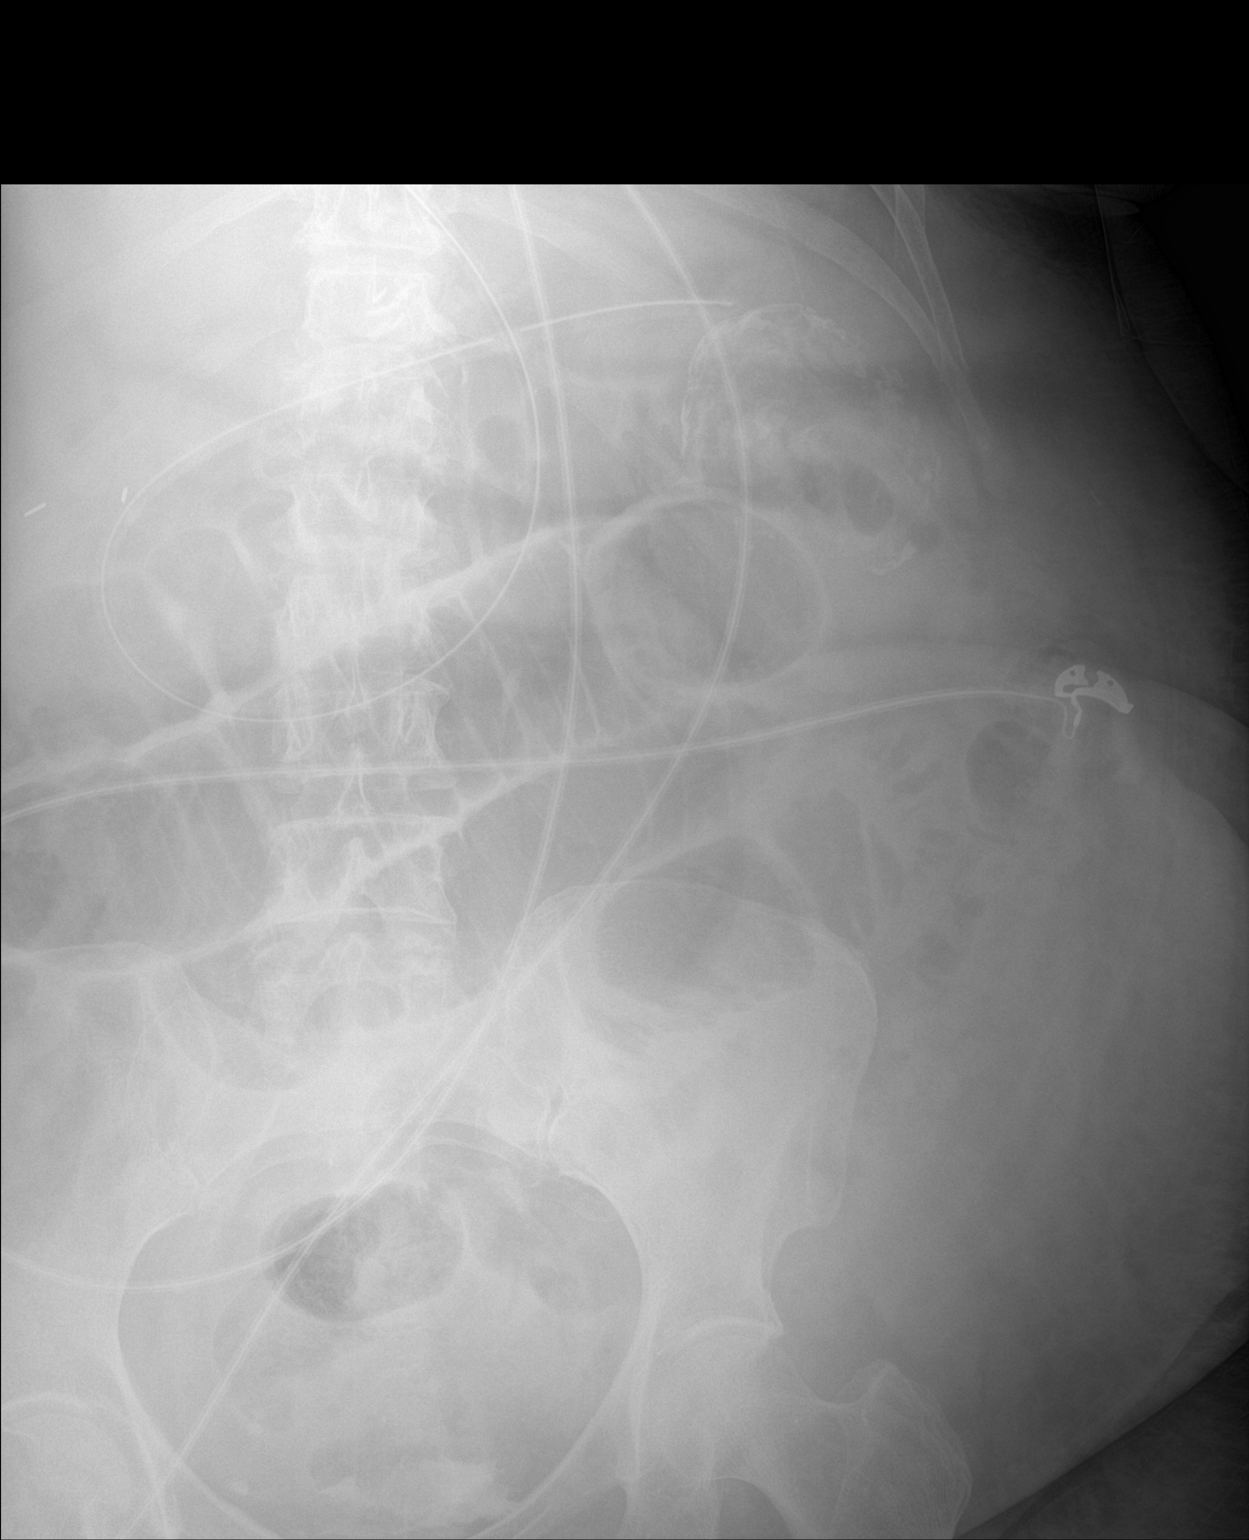

[1 of 1 positions shown; findings below may reference images not displayed]

FINDINGS: An enteric tube forms a loop in the central abdomen, likely in the
gastric body with tip directed into the more proximal stomach.
Multiple dilated loops of small bowel are similar to the earlier CT.
Left upper quadrant calcification corresponds to a splenic mass.
Cholecystectomy clips are noted.
IMPRESSION: 1. Enteric tube tip in the stomach.
2. Small bowel obstruction.

## 2018-10-24 ENCOUNTER — Other Ambulatory Visit: Payer: Self-pay | Admitting: Pulmonary Disease

## 2018-10-24 ENCOUNTER — Other Ambulatory Visit (HOSPITAL_COMMUNITY): Payer: Self-pay | Admitting: Pulmonary Disease

## 2018-10-24 DIAGNOSIS — R0602 Shortness of breath: Secondary | ICD-10-CM

## 2018-10-31 ENCOUNTER — Other Ambulatory Visit: Payer: Self-pay | Admitting: Pulmonary Disease

## 2018-10-31 DIAGNOSIS — R0602 Shortness of breath: Secondary | ICD-10-CM

## 2018-10-31 DIAGNOSIS — R1310 Dysphagia, unspecified: Secondary | ICD-10-CM

## 2018-11-01 ENCOUNTER — Ambulatory Visit
Admission: RE | Admit: 2018-11-01 | Discharge: 2018-11-01 | Disposition: A | Discharge status: Home / Self Care | Payer: Medicare Other | Source: Ambulatory Visit | Attending: Pulmonary Disease | Admitting: Pulmonary Disease

## 2018-11-01 ENCOUNTER — Other Ambulatory Visit: Payer: Self-pay

## 2018-11-01 DIAGNOSIS — R1312 Dysphagia, oropharyngeal phase: Secondary | ICD-10-CM | POA: Diagnosis present

## 2018-11-01 DIAGNOSIS — R0602 Shortness of breath: Secondary | ICD-10-CM | POA: Insufficient documentation

## 2018-11-01 NOTE — Therapy (Signed)
[place], [place], [address] Phone: [phone]   Fax:     Modified Barium Swallow  Patient Details  Name: Jody [name] MRN: [id number] Date of Birth: May 27, 1941 No data recorded  Encounter Date: 11/01/2018  End of Session - 11/01/18 1319    Visit Number  1    Number of Visits  1    Date for SLP Re-Evaluation  11/01/18    SLP Start Time  30    SLP Stop Time   76    SLP Time Calculation (min)  40 min    Activity Tolerance  Patient tolerated treatment well       Past Medical History:  Diagnosis Date  . Asthma   . Atrial fibrillation ([place])   . CHF (congestive heart failure) ([place])   . Lung cancer ([place])    10 yrs ago    Past Surgical History:  Procedure Laterality Date  . COLOSTOMY N/A 01/05/2018   Procedure: COLOSTOMY;  Surgeon: Benjamine Sprague, DO;  Location: [facility] ORS;  Service: General;  Laterality: N/A;  . LAPAROTOMY N/A 01/05/2018   Procedure: EXPLORATORY LAPAROTOMY/ POSSIBLE BOWEL OBSTRUCTION;  Surgeon: Benjamine Sprague, DO;  Location: [facility];  Service: General;  Laterality: N/A;  . LYSIS OF ADHESION N/A 01/05/2018   Procedure: LYSIS OF ADHESION;  Surgeon: Benjamine Sprague, DO;  Location: [facility] ORS;  Service: General;  Laterality: N/A;    There were no vitals filed for this visit.     Subjective: Patient behavior: (alertness, ability to follow instructions, etc.):  The patient is alert and verbally responsive.  She is able to follow directions for this study.  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 will obtain CT chest with contrast -History of lung CA- no obesity symptoms at this time, weight is stable without nocturnal  diaphoresis or febrile episodes- above CT chest imaging to evaluate for mediastinal and hilar lymphadenopathy in the context of possible recurrence of lung malignancy -GERD controlled on omeprazole-possible laryngopharyngeal reflux related cough -History of atopic asthma -patient endorses benefit to albuterol, will evaluate for possible reactive airway disease -will need full PFT   Objective:  Radiological Procedure: A videoflouroscopic evaluation of oral-preparatory, reflex initiation, and pharyngeal phases of the swallow was performed; as well as a screening of the upper esophageal phase.  I. POSTURE: Upright in MBS chair  II. VIEW: Lateral  III. COMPENSATORY STRATEGIES: N/A  IV. BOLUSES ADMINISTERED:   Thin Liquid: 1 small, 3 rapid consecutive   Nectar-thick Liquid: 1 moderate   Honey-thick Liquid: DNT   Puree: 2 teaspoon presentations   Mechanical Soft: 1/4 graham cracker in applesauce  V. RESULTS OF EVALUATION: A. ORAL PREPARATORY PHASE: (The lips, tongue, and velum are observed for strength and coordination)       **Overall Severity Rating: within normal limits   B. SWALLOW INITIATION/REFLEX: (The reflex is normal if "triggered" by the time the bolus reached the base of the tongue)  **Overall Severity Rating: Mild, triggers while falling from the valleculae to the pyriform sinuses with thin liquid  C. PHARYNGEAL PHASE: (Pharyngeal function is normal if the bolus shows rapid, smooth, and continuous transit through the pharynx and there is no pharyngeal residue after the swallow)  **Overall Severity Rating: Minimal; slightly reduced tongue  base retraction and hyolaryngeal excursion with trace pharyngeal residue  D. LARYNGEAL PENETRATION: (Material entering into the laryngeal inlet/vestibule but not aspirated) NONE  E. ASPIRATION: NONE  F. ESOPHAGEAL PHASE: (Screening of the upper esophagus) No observed abnormality within the viewable cervical esophagus  ASSESSMENT: This  78 year old woman; with chronic cough and concern for aspiration secondary personal report of aspiration with expectoration of food particles; is presenting with minimal oropharyngeal dysphagia characterized by delayed pharyngeal swallow initiation, reduced pharyngeal pressure generation, and trace pharyngeal residue post swallow.  Oral control of the bolus including oral hold, rotary mastication, and anterior to posterior transfer is within functional limits (slowed oral management, but no other concerns).   There is no observed laryngeal penetration or tracheal aspiration.  The patient is not at significant risk for prandial aspiration.    PLAN/RECOMMENDATIONS:   A. Diet: Regular   B. Swallowing Precautions: Standard aspiration precautions and reflux precautions   C. Recommended consultation to: follow up with MDs as recommended   D. Therapy recommendations: speech therapy is not indicated   E. Results and recommendations were discussed with the patient immediately following the study and the final report routed to the referring MD.    Oropharyngeal dysphagia - Plan: DG SWALLOW FUNC OP MEDICARE SPEECH PATH, DG SWALLOW FUNC OP MEDICARE SPEECH PATH  Shortness of breath - Plan: DG SWALLOW FUNC OP MEDICARE SPEECH PATH, DG SWALLOW FUNC OP MEDICARE SPEECH PATH        Problem List Patient Active Problem List   Diagnosis Date Noted  . Perforation bowel ([place])   . Poor appetite   . Acute delirium   . Goals of care, counseling/discussion   . Palliative care encounter   . Diverticulitis of colon with perforation 12/27/2017  . Atrial fibrillation with RVR ([place]) 12/27/2017  . Malignant neoplasm of upper lobe of right lung ([place]) 12/31/2015  . 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 TI (tricuspid incompetence) 01/21/2015  . Anxiety 01/13/2015  . Clinical depression 01/13/2015  . 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 Borderline diabetes mellitus 01/13/2015  . Benign essential HTN 01/06/2015  .  Essential (primary) hypertension 01/06/2015  . Atrial fibrillation, chronic ([place]) 05/20/2014  . 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 Chronic atrial fibrillation 05/20/2014  . 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 Cough 11/20/2013  . Breath shortness 11/01/2013  . Breathlessness on exertion 10/30/2013    Leroy Sea, MS/CCC- SLP  Lou Miner 11/01/2018, 1:20 PM  [facility] [place], [place], [address] Phone: [phone]   Fax:     Name: Jody [name] MRN: [id number] Date of Birth: 17-Aug-1940

## 2018-11-03 ENCOUNTER — Ambulatory Visit
Admission: RE | Admit: 2018-11-03 | Discharge: 2018-11-03 | Disposition: A | Discharge status: Home / Self Care | Payer: Medicare Other | Source: Ambulatory Visit | Attending: Pulmonary Disease | Admitting: Pulmonary Disease

## 2018-11-03 ENCOUNTER — Other Ambulatory Visit: Payer: Self-pay

## 2018-11-03 DIAGNOSIS — R0602 Shortness of breath: Secondary | ICD-10-CM | POA: Diagnosis present

## 2018-11-03 MED ORDER — IOHEXOL 300 MG/ML  SOLN
60.0000 mL | Freq: Once | INTRAMUSCULAR | Status: AC | PRN
  Administered 2018-11-03: 60 mL via INTRAVENOUS

## 2018-11-09 ENCOUNTER — Other Ambulatory Visit: Payer: Self-pay | Admitting: Pulmonary Disease

## 2018-11-09 ENCOUNTER — Other Ambulatory Visit (HOSPITAL_COMMUNITY): Payer: Self-pay | Admitting: Pulmonary Disease

## 2018-11-09 DIAGNOSIS — J4540 Moderate persistent asthma, uncomplicated: Secondary | ICD-10-CM

## 2018-11-23 ENCOUNTER — Ambulatory Visit: Payer: Self-pay | Admitting: Surgery

## 2018-11-23 NOTE — H&P (Signed)
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 female who is here for followup from above. Her mentation continues to improve, and is in office today with her CMA.  She states no issues with bag, but she really would prefer to proceed with reversal if possible.  Current Medications: has a current medication list which includes the following prescription(s): albuterol, alendronate, alprazolam, alprazolam, calcium carbonate-vitamin d3, digoxin, diltiazem, eliquis, fluoxetine, furosemide, lovastatin, metoprolol succinate, montelukast, montelukast, montelukast, multivitamin, omeprazole, ondansetron, potassium chloride, trazodone, and diltiazem.  Allergies:       Allergies  Allergen Reactions  . Ampicillin Rash  . Penicillin V Potassium Rash  . Poison Oak Extract Other (See Comments)    blistering    ROS: General: Denies weight loss, weight gain, fatigue, fevers, chills, and night sweats. Heart: Denies chest pain, palpitations, racing heart, irregular heartbeat, leg pain or swelling, and decreased activity tolerance. Respiratory: Denies breathing difficulty, shortness of breath, wheezing. GI: Denies change in appetite, heartburn, nausea, vomiting, constipation, diarrhea, and blood in stool. GU: Denies difficulty urinating, pain with urinating, urgency, frequency, blood in urine   Objective:   BP 103/72   Pulse 109   Wt 66.2 kg (145 lb 15.1 oz)   BMI 29.48 kg/m   Constitutional :  alert, appears stated age, cooperative and no distress  Gastrointestinal: midline incision healed.  ostomy pink, patent, productive, slightly protruding but no palpable lump surrounding hernia to indicate parastomal hernia.    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 Healing well.  No specific issues, but desires reversal.  The risk of surgery include, but not limited to, recurrence, bleeding, chronic pain, post-op infxn, post-op SBO or ileus, hernias, resection of bowel, re-anastamosis, possible ostomy again and need for re-operation to address said risks. The risks of general anesthetic, if used, includes MI, CVA, sudden death or even reaction to anesthetic medications also discussed. Alternatives include continued observation.  Benefits include no need for further ostomy.  Typical post-op recovery time of additional days in hospital for observation afterwards also discussed.  The patient verbalized understanding and all questions were answered to the patient's satisfaction.  Will wait for cards and pulm medical optimization to see if this will still be feasible.  Recently has developed a chronic cough.

## 2018-11-23 NOTE — H&P (View-Only) (Signed)
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 female who is here for followup from above. Her mentation continues to improve, and is in office today with her CMA.  She states no issues with bag, but she really would prefer to proceed with reversal if possible.  Current Medications: has a current medication list which includes the following prescription(s): albuterol, alendronate, alprazolam, alprazolam, calcium carbonate-vitamin d3, digoxin, diltiazem, eliquis, fluoxetine, furosemide, lovastatin, metoprolol succinate, montelukast, montelukast, montelukast, multivitamin, omeprazole, ondansetron, potassium chloride, trazodone, and diltiazem.  Allergies:       Allergies  Allergen Reactions  . Ampicillin Rash  . Penicillin V Potassium Rash  . Poison Oak Extract Other (See Comments)    blistering    ROS: General: Denies weight loss, weight gain, fatigue, fevers, chills, and night sweats. Heart: Denies chest pain, palpitations, racing heart, irregular heartbeat, leg pain or swelling, and decreased activity tolerance. Respiratory: Denies breathing difficulty, shortness of breath, wheezing. GI: Denies change in appetite, heartburn, nausea, vomiting, constipation, diarrhea, and blood in stool. GU: Denies difficulty urinating, pain with urinating, urgency, frequency, blood in urine   Objective:   BP 103/72   Pulse 109   Wt 66.2 kg (145 lb 15.1 oz)   BMI 29.48 kg/m   Constitutional :  alert, appears stated age, cooperative and no distress  Gastrointestinal: midline incision healed.  ostomy pink, patent, productive, slightly protruding but no palpable lump surrounding hernia to indicate parastomal hernia.    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 Healing well.  No specific issues, but desires reversal.  The risk of surgery include, but not limited to, recurrence, bleeding, chronic pain, post-op infxn, post-op SBO or ileus, hernias, resection of bowel, re-anastamosis, possible ostomy again and need for re-operation to address said risks. The risks of general anesthetic, if used, includes MI, CVA, sudden death or even reaction to anesthetic medications also discussed. Alternatives include continued observation.  Benefits include no need for further ostomy.  Typical post-op recovery time of additional days in hospital for observation afterwards also discussed.  The patient verbalized understanding and all questions were answered to the patient's satisfaction.  Will wait for cards and pulm medical optimization to see if this will still be feasible.  Recently has developed a chronic cough.

## 2018-11-24 ENCOUNTER — Encounter
Admission: RE | Admit: 2018-11-24 | Discharge: 2018-11-24 | Disposition: A | Discharge status: Home / Self Care | Payer: Medicare Other | Source: Ambulatory Visit | Attending: Surgery | Admitting: Surgery

## 2018-11-24 ENCOUNTER — Other Ambulatory Visit: Payer: Self-pay

## 2018-11-24 DIAGNOSIS — Z01818 Encounter for other preprocedural examination: Secondary | ICD-10-CM | POA: Diagnosis not present

## 2018-11-24 DIAGNOSIS — Z1159 Encounter for screening for other viral diseases: Secondary | ICD-10-CM | POA: Insufficient documentation

## 2018-11-24 HISTORY — DX: Cardiac arrhythmia, unspecified: I49.9

## 2018-11-24 HISTORY — DX: Essential (primary) hypertension: I10

## 2018-11-24 HISTORY — DX: Gastro-esophageal reflux disease without esophagitis: K21.9

## 2018-11-24 NOTE — Patient Instructions (Signed)
  Your procedure is scheduled on: Tuesday November 28, 2018 Report to Same Day Surgery 2nd floor [facility] Entrance-take elevator on left to 2nd floor.  Check in with surgery information desk.) To find out your arrival time, call [phone] 1:00-3:00 PM on Monday November 27, 2018  Remember: Instructions that are not followed completely may result in serious medical risk, up to and including death, or upon the discretion of your surgeon and anesthesiologist your surgery may need to be rescheduled.    __x__ 1. Do not eat food (including mints, candies, chewing gum) after midnight the night before your procedure. You may drink clear liquids up to 2 hours before you are scheduled to arrive at the hospital for your procedure.  Do not drink anything within 2 hours of your scheduled arrival to the hospital.  Approved clear liquids:  --Water or Apple juice without pulp  --Clear carbohydrate beverage such as Gatorade or Powerade  --Black Coffee or Clear Tea (No milk, no creamers, do not add anything to the coffee or tea)    __x__ 2. No Alcohol for 24 hours before or after surgery.   __x__ 3. No Smoking or e-cigarettes for 24 hours before surgery.  Do not use any chewable tobacco products for at least 6 hours before surgery.   __x__ 4. Notify your doctor if there is any change in your medical condition (cold, fever, infections).   __x__ 5. On the morning of surgery brush your teeth with toothpaste and water.  You may rinse your mouth with mouthwash if you wish.  Do not swallow any toothpaste or mouthwash.  Please read over the following fact sheets that you were given:   [facility] Preparing for Surgery and/or MRSA Information    __x__ Use CHG Soap as directed on instruction sheet.   Do not wear jewelry, make-up, hairpins, clips or nail polish on the day of surgery.  Do not wear lotions, powders, deodorant, or perfumes.   Do not shave below the face/neck 48 hours prior to surgery.    Do not bring valuables to the hospital.    [facility] is not responsible for any belongings or valuables.    For patients admitted to the hospital, discharge time is determined by your treatment team.  For patients discharged on the day of surgery, you will NOT be permitted to drive yourself home.  You must have a responsible adult with you for 24 hours after surgery.  __x__ Take these medicines on the morning of surgery with a SMALL SIP OF WATER:  1. Digoxin (Lanoxin)  2. Diltiazem (Dilacor)  3. Fluoxetine (Prozac)  4. Metoprolol (Toprol)  5. Omeprazole (Prilosec)  Skip your Furosemide (Lasix) only on the morning of surgery.  __x__ Use inhalers (Albuterol) on the day of surgery and bring them with you to the hospital.  __x__ Follow recommendations from Cardiologist, Pulmonologist or PCP regarding stopping Aspirin, Coumadin, Plavix, Eliquis, Effient, Pradaxa, and Pletal.  You have already stopped your Eliquis as directed by Dr. Nehemiah Massed.  __x__ TODAY: Avoid Anti-inflammatories such as Advil, Ibuprofen, Motrin, Aleve, Naproxen, Naprosyn, BC/Goodies powders or aspirin products. You may continue to take Tylenol and Celebrex.

## 2018-11-25 LAB — NOVEL CORONAVIRUS, NAA (HOSP ORDER, SEND-OUT TO REF LAB; TAT 18-24 HRS): SARS-CoV-2, NAA: NOT DETECTED

## 2018-11-27 MED ORDER — CIPROFLOXACIN IN D5W 400 MG/200ML IV SOLN
400.0000 mg | INTRAVENOUS | Status: AC
  Administered 2018-11-28: 400 mg via INTRAVENOUS

## 2018-11-27 MED ORDER — METRONIDAZOLE IN NACL 5-0.79 MG/ML-% IV SOLN
500.0000 mg | INTRAVENOUS | Status: AC
  Administered 2018-11-28: 500 mg via INTRAVENOUS
  Filled 2018-11-27: qty 100

## 2018-11-28 ENCOUNTER — Ambulatory Visit: Payer: Medicare Other | Admitting: Anesthesiology

## 2018-11-28 ENCOUNTER — Encounter
Admission: RE | Disposition: A | Discharge status: Home Health | Payer: Self-pay | Source: Home / Self Care | Attending: Surgery

## 2018-11-28 ENCOUNTER — Other Ambulatory Visit: Payer: Self-pay

## 2018-11-28 ENCOUNTER — Inpatient Hospital Stay
Admission: RE | Admit: 2018-11-28 | Discharge: 2018-12-03 | DRG: 345 | Disposition: A | Discharge status: Home Health | Payer: Medicare Other | Attending: Surgery | Admitting: Surgery

## 2018-11-28 ENCOUNTER — Encounter: Payer: Self-pay | Admitting: *Deleted

## 2018-11-28 DIAGNOSIS — I482 Chronic atrial fibrillation, unspecified: Secondary | ICD-10-CM | POA: Diagnosis present

## 2018-11-28 DIAGNOSIS — Z923 Personal history of irradiation: Secondary | ICD-10-CM

## 2018-11-28 DIAGNOSIS — J45909 Unspecified asthma, uncomplicated: Secondary | ICD-10-CM | POA: Diagnosis present

## 2018-11-28 DIAGNOSIS — I429 Cardiomyopathy, unspecified: Secondary | ICD-10-CM | POA: Diagnosis present

## 2018-11-28 DIAGNOSIS — Z85118 Personal history of other malignant neoplasm of bronchus and lung: Secondary | ICD-10-CM | POA: Diagnosis not present

## 2018-11-28 DIAGNOSIS — I110 Hypertensive heart disease with heart failure: Secondary | ICD-10-CM | POA: Diagnosis present

## 2018-11-28 DIAGNOSIS — K219 Gastro-esophageal reflux disease without esophagitis: Secondary | ICD-10-CM | POA: Diagnosis present

## 2018-11-28 DIAGNOSIS — E669 Obesity, unspecified: Secondary | ICD-10-CM | POA: Diagnosis present

## 2018-11-28 DIAGNOSIS — Z79899 Other long term (current) drug therapy: Secondary | ICD-10-CM

## 2018-11-28 DIAGNOSIS — Z433 Encounter for attention to colostomy: Secondary | ICD-10-CM | POA: Diagnosis present

## 2018-11-28 DIAGNOSIS — Z7901 Long term (current) use of anticoagulants: Secondary | ICD-10-CM

## 2018-11-28 DIAGNOSIS — Z6829 Body mass index (BMI) 29.0-29.9, adult: Secondary | ICD-10-CM

## 2018-11-28 DIAGNOSIS — I5020 Unspecified systolic (congestive) heart failure: Secondary | ICD-10-CM | POA: Diagnosis present

## 2018-11-28 DIAGNOSIS — Z9889 Other specified postprocedural states: Secondary | ICD-10-CM | POA: Diagnosis present

## 2018-11-28 HISTORY — PX: COLOSTOMY TAKEDOWN: SHX5783

## 2018-11-28 LAB — CBC
HCT: 35.3 % — ABNORMAL LOW (ref 36.0–46.0)
Hemoglobin: 11.6 g/dL — ABNORMAL LOW (ref 12.0–15.0)
MCH: 28.3 pg (ref 26.0–34.0)
MCHC: 32.9 g/dL (ref 30.0–36.0)
MCV: 86.1 fL (ref 80.0–100.0)
Platelets: 203 10*3/uL (ref 150–400)
RBC: 4.1 MIL/uL (ref 3.87–5.11)
RDW: 14.6 % (ref 11.5–15.5)
WBC: 17.1 10*3/uL — ABNORMAL HIGH (ref 4.0–10.5)
nRBC: 0 % (ref 0.0–0.2)

## 2018-11-28 LAB — CREATININE, SERUM
Creatinine, Ser: 1.06 mg/dL — ABNORMAL HIGH (ref 0.44–1.00)
GFR calc Af Amer: 57 mL/min — ABNORMAL LOW
GFR calc non Af Amer: 50 mL/min — ABNORMAL LOW

## 2018-11-28 LAB — POCT I-STAT 4, (NA,K, GLUC, HGB,HCT)
Glucose, Bld: 111 mg/dL — ABNORMAL HIGH (ref 70–99)
HCT: 34 % — ABNORMAL LOW (ref 36.0–46.0)
Hemoglobin: 11.6 g/dL — ABNORMAL LOW (ref 12.0–15.0)
Potassium: 4.7 mmol/L (ref 3.5–5.1)
Sodium: 130 mmol/L — ABNORMAL LOW (ref 135–145)

## 2018-11-28 SURGERY — CLOSURE, COLOSTOMY
Anesthesia: General

## 2018-11-28 MED ORDER — ROCURONIUM BROMIDE 100 MG/10ML IV SOLN
INTRAVENOUS | Status: DC | PRN
  Administered 2018-11-28: 10 mg via INTRAVENOUS
  Administered 2018-11-28: 50 mg via INTRAVENOUS
  Administered 2018-11-28: 20 mg via INTRAVENOUS

## 2018-11-28 MED ORDER — ONDANSETRON HCL 4 MG/2ML IJ SOLN
4.0000 mg | Freq: Four times a day (QID) | INTRAMUSCULAR | Status: DC | PRN
  Administered 2018-12-01: 4 mg via INTRAVENOUS
  Filled 2018-11-28: qty 2

## 2018-11-28 MED ORDER — FENTANYL CITRATE (PF) 100 MCG/2ML IJ SOLN: 25.0000 ug | INTRAMUSCULAR | Status: DC | PRN

## 2018-11-28 MED ORDER — LIDOCAINE HCL (CARDIAC) PF 100 MG/5ML IV SOSY
PREFILLED_SYRINGE | INTRAVENOUS | Status: DC | PRN
  Administered 2018-11-28: 80 mg via INTRAVENOUS

## 2018-11-28 MED ORDER — BUPIVACAINE HCL (PF) 0.25 % IJ SOLN
INTRAMUSCULAR | Status: AC
  Filled 2018-11-28: qty 30

## 2018-11-28 MED ORDER — HYDROCODONE-ACETAMINOPHEN 5-325 MG PO TABS: 1.0000 | ORAL_TABLET | Freq: Four times a day (QID) | ORAL | Status: DC | PRN

## 2018-11-28 MED ORDER — PHENYLEPHRINE HCL (PRESSORS) 10 MG/ML IV SOLN
INTRAVENOUS | Status: AC
  Filled 2018-11-28: qty 1

## 2018-11-28 MED ORDER — PRAVASTATIN SODIUM 20 MG PO TABS
20.0000 mg | ORAL_TABLET | Freq: Every day | ORAL | Status: DC
  Administered 2018-11-28 – 2018-12-02 (×5): 20 mg via ORAL
  Filled 2018-11-28 (×5): qty 1

## 2018-11-28 MED ORDER — ENOXAPARIN SODIUM 40 MG/0.4ML ~~LOC~~ SOLN
40.0000 mg | SUBCUTANEOUS | Status: DC
  Administered 2018-11-29 – 2018-12-01 (×3): 40 mg via SUBCUTANEOUS
  Filled 2018-11-28 (×3): qty 0.4

## 2018-11-28 MED ORDER — ONDANSETRON HCL 4 MG/2ML IJ SOLN
INTRAMUSCULAR | Status: AC
  Filled 2018-11-28: qty 2

## 2018-11-28 MED ORDER — PHENYLEPHRINE HCL (PRESSORS) 10 MG/ML IV SOLN
INTRAVENOUS | Status: DC | PRN
  Administered 2018-11-28: 50 ug via INTRAVENOUS
  Administered 2018-11-28: 100 ug via INTRAVENOUS
  Administered 2018-11-28: 50 ug via INTRAVENOUS
  Administered 2018-11-28: 150 ug via INTRAVENOUS
  Administered 2018-11-28: 50 ug via INTRAVENOUS
  Administered 2018-11-28: 100 ug via INTRAVENOUS
  Administered 2018-11-28 (×2): 50 ug via INTRAVENOUS
  Administered 2018-11-28 (×2): 100 ug via INTRAVENOUS
  Administered 2018-11-28: 150 ug via INTRAVENOUS
  Administered 2018-11-28: 50 ug via INTRAVENOUS
  Administered 2018-11-28: 100 ug via INTRAVENOUS

## 2018-11-28 MED ORDER — CHLORHEXIDINE GLUCONATE CLOTH 2 % EX PADS: 6.0000 | MEDICATED_PAD | Freq: Once | CUTANEOUS | Status: DC

## 2018-11-28 MED ORDER — ALBUTEROL SULFATE (2.5 MG/3ML) 0.083% IN NEBU: 2.5000 mg | INHALATION_SOLUTION | Freq: Four times a day (QID) | RESPIRATORY_TRACT | Status: DC | PRN

## 2018-11-28 MED ORDER — CIPROFLOXACIN IN D5W 400 MG/200ML IV SOLN
INTRAVENOUS | Status: AC
  Administered 2018-11-28: 400 mg via INTRAVENOUS
  Filled 2018-11-28: qty 200

## 2018-11-28 MED ORDER — DEXAMETHASONE SODIUM PHOSPHATE 10 MG/ML IJ SOLN
INTRAMUSCULAR | Status: AC
  Filled 2018-11-28: qty 1

## 2018-11-28 MED ORDER — ADULT MULTIVITAMIN W/MINERALS CH
1.0000 | ORAL_TABLET | Freq: Every day | ORAL | Status: DC
  Administered 2018-11-29 – 2018-12-03 (×5): 1 via ORAL
  Filled 2018-11-28 (×5): qty 1

## 2018-11-28 MED ORDER — GABAPENTIN 300 MG PO CAPS
ORAL_CAPSULE | ORAL | Status: AC
  Administered 2018-11-28: 07:00:00 300 mg via ORAL
  Filled 2018-11-28: qty 1

## 2018-11-28 MED ORDER — FLUOXETINE HCL 20 MG PO CAPS
20.0000 mg | ORAL_CAPSULE | Freq: Every day | ORAL | Status: DC
  Administered 2018-11-28 – 2018-12-03 (×6): 20 mg via ORAL
  Filled 2018-11-28 (×6): qty 1

## 2018-11-28 MED ORDER — SODIUM CHLORIDE 0.9 % IV SOLN
INTRAVENOUS | Status: DC | PRN
  Administered 2018-11-28: 20 ug/min via INTRAVENOUS

## 2018-11-28 MED ORDER — ONDANSETRON HCL 4 MG/2ML IJ SOLN
INTRAMUSCULAR | Status: DC | PRN
  Administered 2018-11-28: 4 mg via INTRAVENOUS

## 2018-11-28 MED ORDER — POTASSIUM CHLORIDE CRYS ER 10 MEQ PO TBCR
10.0000 meq | EXTENDED_RELEASE_TABLET | Freq: Every day | ORAL | Status: DC
  Administered 2018-11-28 – 2018-12-03 (×6): 10 meq via ORAL
  Filled 2018-11-28 (×6): qty 1

## 2018-11-28 MED ORDER — GABAPENTIN 300 MG PO CAPS
300.0000 mg | ORAL_CAPSULE | ORAL | Status: AC
  Administered 2018-11-28: 300 mg via ORAL

## 2018-11-28 MED ORDER — SODIUM CHLORIDE FLUSH 0.9 % IV SOLN
INTRAVENOUS | Status: AC
  Filled 2018-11-28: qty 10

## 2018-11-28 MED ORDER — ALPRAZOLAM 0.5 MG PO TABS: 0.5000 mg | ORAL_TABLET | Freq: Every evening | ORAL | Status: DC | PRN

## 2018-11-28 MED ORDER — ROCURONIUM BROMIDE 50 MG/5ML IV SOLN
INTRAVENOUS | Status: AC
  Filled 2018-11-28: qty 1

## 2018-11-28 MED ORDER — EPHEDRINE SULFATE 50 MG/ML IJ SOLN
INTRAMUSCULAR | Status: AC
  Administered 2018-11-28: 20 mg via INTRAVENOUS
  Filled 2018-11-28: qty 1

## 2018-11-28 MED ORDER — ALBUTEROL SULFATE HFA 108 (90 BASE) MCG/ACT IN AERS: 2.0000 | INHALATION_SPRAY | Freq: Four times a day (QID) | RESPIRATORY_TRACT | Status: DC | PRN

## 2018-11-28 MED ORDER — MONTELUKAST SODIUM 10 MG PO TABS
10.0000 mg | ORAL_TABLET | Freq: Every day | ORAL | Status: DC
  Administered 2018-11-28 – 2018-12-02 (×5): 10 mg via ORAL
  Filled 2018-11-28 (×5): qty 1

## 2018-11-28 MED ORDER — LACTATED RINGERS IV SOLN
INTRAVENOUS | Status: DC
  Administered 2018-11-28 (×2): via INTRAVENOUS

## 2018-11-28 MED ORDER — FUROSEMIDE 20 MG PO TABS
20.0000 mg | ORAL_TABLET | Freq: Every day | ORAL | Status: DC
  Administered 2018-11-29 – 2018-12-03 (×5): 20 mg via ORAL
  Filled 2018-11-28 (×5): qty 1

## 2018-11-28 MED ORDER — LACTATED RINGERS IV BOLUS
150.0000 mL | Freq: Once | INTRAVENOUS | Status: AC
  Administered 2018-11-28: 150 mL via INTRAVENOUS

## 2018-11-28 MED ORDER — SUGAMMADEX SODIUM 200 MG/2ML IV SOLN
INTRAVENOUS | Status: DC | PRN
  Administered 2018-11-28: 200 mg via INTRAVENOUS

## 2018-11-28 MED ORDER — ONDANSETRON HCL 4 MG PO TABS: 4.0000 mg | ORAL_TABLET | Freq: Four times a day (QID) | ORAL | Status: DC | PRN

## 2018-11-28 MED ORDER — CALCIUM CARBONATE-VITAMIN D 500-200 MG-UNIT PO TABS
1.0000 | ORAL_TABLET | Freq: Every day | ORAL | Status: DC
  Administered 2018-11-29 – 2018-12-03 (×5): 1 via ORAL
  Filled 2018-11-28 (×5): qty 1

## 2018-11-28 MED ORDER — LACTATED RINGERS IV SOLN
INTRAVENOUS | Status: DC
  Administered 2018-11-28 – 2018-11-29 (×3): via INTRAVENOUS

## 2018-11-28 MED ORDER — FENTANYL CITRATE (PF) 250 MCG/5ML IJ SOLN
INTRAMUSCULAR | Status: AC
  Filled 2018-11-28: qty 5

## 2018-11-28 MED ORDER — MORPHINE SULFATE (PF) 2 MG/ML IV SOLN: 2.0000 mg | INTRAVENOUS | Status: DC | PRN

## 2018-11-28 MED ORDER — SUGAMMADEX SODIUM 200 MG/2ML IV SOLN
INTRAVENOUS | Status: AC
  Filled 2018-11-28: qty 2

## 2018-11-28 MED ORDER — BUPIVACAINE LIPOSOME 1.3 % IJ SUSP
INTRAMUSCULAR | Status: AC
  Filled 2018-11-28: qty 20

## 2018-11-28 MED ORDER — SODIUM CHLORIDE FLUSH 0.9 % IV SOLN
INTRAVENOUS | Status: AC
  Filled 2018-11-28: qty 50

## 2018-11-28 MED ORDER — SODIUM CHLORIDE 0.9 % IV SOLN
INTRAVENOUS | Status: DC | PRN
  Administered 2018-11-28: 11:00:00 70 mL

## 2018-11-28 MED ORDER — DILTIAZEM HCL ER COATED BEADS 240 MG PO CP24
240.0000 mg | ORAL_CAPSULE | Freq: Every day | ORAL | Status: DC
  Administered 2018-11-29 – 2018-12-03 (×5): 240 mg via ORAL
  Filled 2018-11-28 (×4): qty 2
  Filled 2018-11-28: qty 1
  Filled 2018-11-28: qty 2
  Filled 2018-11-28 (×5): qty 1

## 2018-11-28 MED ORDER — ONDANSETRON HCL 4 MG/2ML IJ SOLN
4.0000 mg | Freq: Once | INTRAMUSCULAR | Status: AC | PRN
  Administered 2018-11-28: 4 mg via INTRAVENOUS

## 2018-11-28 MED ORDER — PROPOFOL 10 MG/ML IV BOLUS
INTRAVENOUS | Status: DC | PRN
  Administered 2018-11-28: 100 mg via INTRAVENOUS

## 2018-11-28 MED ORDER — CELECOXIB 200 MG PO CAPS
200.0000 mg | ORAL_CAPSULE | ORAL | Status: AC
  Administered 2018-11-28: 200 mg via ORAL

## 2018-11-28 MED ORDER — DIGOXIN 125 MCG PO TABS
0.1250 mg | ORAL_TABLET | Freq: Every day | ORAL | Status: DC
  Administered 2018-11-29 – 2018-12-02 (×4): 0.125 mg via ORAL
  Filled 2018-11-28 (×5): qty 1

## 2018-11-28 MED ORDER — TRAZODONE HCL 50 MG PO TABS
50.0000 mg | ORAL_TABLET | Freq: Every day | ORAL | Status: DC
  Administered 2018-11-28 – 2018-12-02 (×5): 50 mg via ORAL
  Filled 2018-11-28 (×5): qty 1

## 2018-11-28 MED ORDER — METOPROLOL SUCCINATE ER 50 MG PO TB24: 50.0000 mg | ORAL_TABLET | Freq: Every day | ORAL | Status: DC

## 2018-11-28 MED ORDER — SUCCINYLCHOLINE CHLORIDE 20 MG/ML IJ SOLN
INTRAMUSCULAR | Status: AC
  Filled 2018-11-28: qty 1

## 2018-11-28 MED ORDER — PANTOPRAZOLE SODIUM 40 MG PO TBEC
40.0000 mg | DELAYED_RELEASE_TABLET | Freq: Every day | ORAL | Status: DC
  Administered 2018-11-29 – 2018-12-03 (×5): 40 mg via ORAL
  Filled 2018-11-28 (×5): qty 1

## 2018-11-28 MED ORDER — EPHEDRINE SULFATE 50 MG/ML IJ SOLN
20.0000 mg | Freq: Once | INTRAMUSCULAR | Status: AC
  Administered 2018-11-28: 20 mg via INTRAVENOUS

## 2018-11-28 MED ORDER — ACETAMINOPHEN 500 MG PO TABS
ORAL_TABLET | ORAL | Status: AC
  Administered 2018-11-28: 1000 mg via ORAL
  Filled 2018-11-28: qty 2

## 2018-11-28 MED ORDER — TRAMADOL HCL 50 MG PO TABS: 50.0000 mg | ORAL_TABLET | Freq: Four times a day (QID) | ORAL | Status: DC | PRN

## 2018-11-28 MED ORDER — PROPOFOL 10 MG/ML IV BOLUS
INTRAVENOUS | Status: AC
  Filled 2018-11-28: qty 40

## 2018-11-28 MED ORDER — ACETAMINOPHEN 325 MG PO TABS
650.0000 mg | ORAL_TABLET | Freq: Four times a day (QID) | ORAL | Status: DC | PRN
  Filled 2018-11-28: qty 2

## 2018-11-28 MED ORDER — ALENDRONATE SODIUM 70 MG PO TABS: 70.0000 mg | ORAL_TABLET | ORAL | Status: DC

## 2018-11-28 MED ORDER — FENTANYL CITRATE (PF) 100 MCG/2ML IJ SOLN
INTRAMUSCULAR | Status: DC | PRN
  Administered 2018-11-28 (×3): 50 ug via INTRAVENOUS
  Administered 2018-11-28: 25 ug via INTRAVENOUS

## 2018-11-28 MED ORDER — DEXAMETHASONE SODIUM PHOSPHATE 10 MG/ML IJ SOLN
INTRAMUSCULAR | Status: DC | PRN
  Administered 2018-11-28: 5 mg via INTRAVENOUS

## 2018-11-28 MED ORDER — CELECOXIB 200 MG PO CAPS
ORAL_CAPSULE | ORAL | Status: AC
  Administered 2018-11-28: 200 mg via ORAL
  Filled 2018-11-28: qty 1

## 2018-11-28 MED ORDER — LIDOCAINE HCL (PF) 2 % IJ SOLN
INTRAMUSCULAR | Status: AC
  Filled 2018-11-28: qty 10

## 2018-11-28 MED ORDER — DILTIAZEM HCL 25 MG/5ML IV SOLN
25.0000 mg | Freq: Once | INTRAVENOUS | Status: DC
  Filled 2018-11-28: qty 5

## 2018-11-28 MED ORDER — BUPIVACAINE HCL 0.25 % IJ SOLN
INTRAMUSCULAR | Status: DC | PRN
  Administered 2018-11-28: 30 mL

## 2018-11-28 MED ORDER — ACETAMINOPHEN 500 MG PO TABS
1000.0000 mg | ORAL_TABLET | ORAL | Status: AC
  Administered 2018-11-28: 1000 mg via ORAL

## 2018-11-28 SURGICAL SUPPLY — 53 items
BULB RESERV EVAC DRAIN JP 100C (MISCELLANEOUS) ×2 IMPLANT
CANISTER SUCT 1200ML W/VALVE (MISCELLANEOUS) ×3 IMPLANT
CHLORAPREP W/TINT 26 (MISCELLANEOUS) ×3 IMPLANT
COVER WAND RF STERILE (DRAPES) ×3 IMPLANT
DRAIN CHANNEL JP 15F RND 16 (MISCELLANEOUS) ×2 IMPLANT
DRAPE LAPAROTOMY 100X77 ABD (DRAPES) ×3 IMPLANT
DRAPE LEGGINS SURG 28X43 STRL (DRAPES) ×2 IMPLANT
DRAPE UNDER BUTTOCK W/FLU (DRAPES) ×2 IMPLANT
DRSG OPSITE POSTOP 4X10 (GAUZE/BANDAGES/DRESSINGS) ×1 IMPLANT
DRSG OPSITE POSTOP 4X6 (GAUZE/BANDAGES/DRESSINGS) ×2 IMPLANT
DRSG OPSITE POSTOP 4X8 (GAUZE/BANDAGES/DRESSINGS) ×1 IMPLANT
DRSG TEGADERM 4X10 (GAUZE/BANDAGES/DRESSINGS) ×1 IMPLANT
DRSG TELFA 3X8 NADH (GAUZE/BANDAGES/DRESSINGS) IMPLANT
ELECT BLADE 6.5 EXT (BLADE) ×2 IMPLANT
ELECT CAUTERY BLADE 6.4 (BLADE) ×2 IMPLANT
ELECT REM PT RETURN 9FT ADLT (ELECTROSURGICAL) ×3
ELECTRODE REM PT RTRN 9FT ADLT (ELECTROSURGICAL) ×1 IMPLANT
GAUZE PACKING IODOFORM 1X5 (MISCELLANEOUS) ×2 IMPLANT
GAUZE SPONGE 4X4 12PLY STRL (GAUZE/BANDAGES/DRESSINGS) ×2 IMPLANT
GLOVE BIOGEL PI IND STRL 7.0 (GLOVE) ×1 IMPLANT
GLOVE BIOGEL PI INDICATOR 7.0 (GLOVE) ×6
GLOVE SURG SYN 6.5 ES PF (GLOVE) ×3 IMPLANT
GLOVE SURG SYN 6.5 PF PI (GLOVE) ×1 IMPLANT
GOWN STRL REUS W/ TWL LRG LVL3 (GOWN DISPOSABLE) ×2 IMPLANT
GOWN STRL REUS W/TWL LRG LVL3 (GOWN DISPOSABLE) ×6
HOLDER FOLEY CATH W/STRAP (MISCELLANEOUS) ×2 IMPLANT
KIT TURNOVER KIT A (KITS) ×3 IMPLANT
LABEL OR SOLS (LABEL) ×3 IMPLANT
LIGASURE IMPACT 36 18CM CVD LR (INSTRUMENTS) ×2 IMPLANT
NS IRRIG 1000ML POUR BTL (IV SOLUTION) ×3 IMPLANT
PACK BASIN MAJOR ARMC (MISCELLANEOUS) ×3 IMPLANT
PACK COLON CLEAN CLOSURE (MISCELLANEOUS) ×3 IMPLANT
PAD DRESSING TELFA 3X8 NADH (GAUZE/BANDAGES/DRESSINGS) ×1 IMPLANT
PREVENA INCISION MGT 90 150 (MISCELLANEOUS) ×2 IMPLANT
SPONGE DRAIN TRACH 4X4 STRL 2S (GAUZE/BANDAGES/DRESSINGS) ×2 IMPLANT
SPONGE LAP 18X18 RF (DISPOSABLE) ×3 IMPLANT
SPONGE VERSALON 4X4 4PLY (MISCELLANEOUS) ×2 IMPLANT
STAPLER RELOADABLE 65 2-0 SUT (MISCELLANEOUS) IMPLANT
STAPLER SKIN PROX 35W (STAPLE) ×2 IMPLANT
STAPLER SYS INTERNAL RELOAD SS (MISCELLANEOUS) ×6 IMPLANT
STAPLER TA28 THK CONTR EEA XL (STAPLE) ×2 IMPLANT
SUT ETHILON 3-0 FS-10 30 BLK (SUTURE) ×3
SUT PDS AB 1 TP1 54 (SUTURE) ×6 IMPLANT
SUT SILK 2 0 (SUTURE) ×2
SUT SILK 2-0 18XBRD TIE 12 (SUTURE) ×1 IMPLANT
SUT SILK 3 0 (SUTURE) ×2
SUT SILK 3-0 18XBRD TIE 12 (SUTURE) ×1 IMPLANT
SUT VIC AB 0 CT1 36 (SUTURE) ×2 IMPLANT
SUT VIC AB 3-0 SH 27 (SUTURE) ×4
SUT VIC AB 3-0 SH 27X BRD (SUTURE) ×2 IMPLANT
SUTURE EHLN 3-0 FS-10 30 BLK (SUTURE) IMPLANT
SYR BULB IRRIG 60ML STRL (SYRINGE) ×2 IMPLANT
TRAY FOLEY MTR SLVR 16FR STAT (SET/KITS/TRAYS/PACK) ×3 IMPLANT

## 2018-11-28 NOTE — Transfer of Care (Signed)
Immediate Anesthesia Transfer of Care Note  Patient: [name]  Procedure(s) Performed: COLOSTOMY TAKEDOWN, DIABETIC (N/A )  Patient Location: PACU  Anesthesia Type:General  Level of Consciousness: drowsy  Airway & Oxygen Therapy: Patient Spontanous Breathing and Patient connected to face mask oxygen  Post-op Assessment: Report given to RN and Post -op Vital signs reviewed and stable  Post vital signs: stable  Last Vitals:  Vitals Value Taken Time  BP 128/88 11/28/18 1155  Temp    Pulse    Resp 22 11/28/18 1157  SpO2    Vitals shown include unvalidated device data.  Last Pain:  Vitals:   11/28/18 0654  TempSrc: Temporal  PainSc: 0-No pain      Patients Stated Pain Goal: 0 (70/14/10 3013)  Complications: No apparent anesthesia complications

## 2018-11-28 NOTE — Anesthesia Procedure Notes (Signed)
Procedure Name: Intubation Date/Time: 11/28/2018 7:34 AM Performed by: Lavone Orn, CRNA Pre-anesthesia Checklist: Patient identified, Emergency Drugs available, Suction available, Patient being monitored and Timeout performed Patient Re-evaluated:Patient Re-evaluated prior to induction Oxygen Delivery Method: Circle system utilized Preoxygenation: Pre-oxygenation with 100% oxygen Induction Type: IV induction Ventilation: Mask ventilation without difficulty Laryngoscope Size: Mac and 3 Grade View: Grade I Tube type: Oral Tube size: 7.0 mm Number of attempts: 1 Airway Equipment and Method: Stylet Placement Confirmation: ETT inserted through vocal cords under direct vision,  positive ETCO2 and breath sounds checked- equal and bilateral Secured at: 19 (At teeth) cm Tube secured with: Tape Dental Injury: Teeth and Oropharynx as per pre-operative assessment

## 2018-11-28 NOTE — Interval H&P Note (Signed)
History and Physical Interval Note:  11/28/2018 12:22 PM  Jody [name]  has presented today for surgery, with the diagnosis of S/P colostomy creation.  The various methods of treatment have been discussed with the patient and family. After consideration of risks, benefits and other options for treatment, the patient has consented to  Procedure(s): COLOSTOMY TAKEDOWN, DIABETIC (N/A) as a surgical intervention.  The patient's history has been reviewed, patient examined, no change in status, stable for surgery.  I have reviewed the patient's chart and labs.  Questions were answered to the patient's satisfaction.     Bobbi Yount Lysle Pearl

## 2018-11-28 NOTE — Anesthesia Preprocedure Evaluation (Signed)
Anesthesia Evaluation  Patient identified by MRN, date of birth, ID band Patient awake    Reviewed: Allergy & Precautions, NPO status , Patient's Chart, lab work & pertinent test results  History of Anesthesia Complications Negative for: history of anesthetic complications  Airway Mallampati: II  TM Distance: >3 FB Neck ROM: Full    Dental no notable dental hx.    Pulmonary asthma , neg sleep apnea,    breath sounds clear to auscultation- rhonchi (-) wheezing      Cardiovascular hypertension, Pt. on medications +CHF  (-) CAD, (-) Past MI, (-) Cardiac Stents and (-) CABG + dysrhythmias Atrial Fibrillation  Rhythm:Regular Rate:Normal - Systolic murmurs and - Diastolic murmurs    Neuro/Psych neg Seizures PSYCHIATRIC DISORDERS Anxiety Depression negative neurological ROS     GI/Hepatic Neg liver ROS, GERD  ,  Endo/Other  negative endocrine ROSneg diabetes  Renal/GU negative Renal ROS     Musculoskeletal negative musculoskeletal ROS (+)   Abdominal (+) + obese,   Peds  Hematology negative hematology ROS (+)   Anesthesia Other Findings Past Medical History: No date: Asthma No date: Atrial fibrillation (HCC) No date: CHF (congestive heart failure) (HCC) No date: Dysrhythmia No date: GERD (gastroesophageal reflux disease) No date: Hypertension 02/2005: Primary adenocarcinoma of right lung ([facility])     Comment:  Rad tx's.    Reproductive/Obstetrics                             Anesthesia Physical Anesthesia Plan  ASA: III  Anesthesia Plan: General   Post-op Pain Management:    Induction: Intravenous  PONV Risk Score and Plan: 2 and Ondansetron and Dexamethasone  Airway Management Planned: Oral ETT  Additional Equipment:   Intra-op Plan:   Post-operative Plan: Extubation in OR  Informed Consent: I have reviewed the patients History and Physical, chart, labs and discussed the  procedure including the risks, benefits and alternatives for the proposed anesthesia with the patient or authorized representative who has indicated his/her understanding and acceptance.     Dental advisory given  Plan Discussed with: CRNA and Anesthesiologist  Anesthesia Plan Comments:         Anesthesia Quick Evaluation

## 2018-11-28 NOTE — OR Nursing (Signed)
Dr. Kayleen Memos is giving some Cardizem IV push.

## 2018-11-28 NOTE — Op Note (Signed)
Preoperative diagnosis: End colostomy status.  Postoperative diagnosis: End Colostomy Status.   Procedure: Open colostomy reversal  Anesthesia: GETA  Surgeon: Lysle Pearl Assistant: Windell Moment for exposure  Wound Classification: Clean Contaminated  Specimen: Anastomosis rings  Complications: None  EBL: 247mL  Indications:  Patient is a 78 y.o. female that underwent diverting end colostomy and Hartman's pouch for diverticulitis. Patient presents today for take down of his Hartmann's pouch.   Description of procedure: The patient was taken to the operating room and placed in the supine position. General endotracheal anesthesia was induced without any difficulty. A time-out was completed verifying correct patient, procedure, site, positioning, and implant(s) and/or special equipment prior to beginning this procedure. Both upper extremities were tucked.  A Foley catheter and an NG tube were placed. Preoperative antibiotics were given. The patient was re-positioned in the dorsal lithotomy position with stirrups and care was taken to pad all pressure points. The colostomy was covered with a Tegaderm to prevent fecal contamination of the surgical field. The patient's abdomen and perineum were then prepped and draped in the usual sterile fashion.  Incision made through the old midline scar.    The bowel was inspected for gross abnormities. No abnormalities were found.  Patient placed in reverse Trendelenburg and the small bowel attachments within the pelvis was removed using lap scissors and electrocautery.  Previously placed Prolene sutures were noted within the tissue.  Extensive dissection within the pelvic area taking over an hour, including dissection within the extraperitoneal space, finally allowed adequate visualization of the rectal stump.    Attention was turned toward taking down the colostomy.  An elliptical incision encompassing the colostomy was made using the electrocautery and deepened  through the subcutaneous and surrounding fascia. Once the proximal colon was freed from the fascia and reducible into the abdomen, a pursestring device was used to transect the end, and the anvil portion of a 28 mm EEA stapler was secured in place.  During dissection of the remaining colon wall of the colostomy site, it was noted that the LigaSure burn site was very close to the colon wall immediately proximal to the anvil site.  Decision was made to resect this portion and re-secure the anvil device with another pursestring device in order to avoid complications.  The colon was still easily noted to reach the rectal stump.  The more proximal colon did not have any further evidence of diverticuli, on gross examination and palpation.  NG tube was within the stomach.  The EEA stapler was advanced carefully by an assistant from below.  The antimesenteric boarders were aligned and proximal colon confirmed to not be twisted.  The stapler and was opened all the way but the base of the attachment site for the anchor was not able to be visualized.  Further dissection had to be carried out down to the actual bowel wall before noting the base of the attachment.  The previously noted supposedly rectal wall was thick peritoneum that was still overlying the actual stump, and the visible Prolene wounds were noted to be slightly more to the lateral aspect of where the attachment site came through the rectal wall.  Despite this, there was noted to be an adequate landing site for the new anastomosis, so decision was made to continue with the anastomosis.  The EEA stapler was attached to the anvil and fired. The stapler was removed and the rings of tissue retained in the stapler were inspected and found to be complete with intact rings. The  anastomosis was tested for patency and integrity by filling the abdomen with saline and insufflating the rectum with air while occluding the colon proximal to the anastomosis. No bubbles were  noted and air was seen to pass across the anastomosis proximally. The abdomen was copiously irrigated with normal saline.  Hemostasis was checked.  Due to the additional dissection required after firing the attachment site, a Blake drain was placed within the pelvis to further monitor the area.  The anastomosis in the end looked to be intact, healthy, with minimal tension.    Exparel infused along the midline incision as well as the former colostomy site.  The fascia was close with a running suture of 1 PDS x1.  Peritoneum at the former colostomy site was closed with running 0 Vicryl from within the abdominal cavity prior to closure.  Additional 0 Vicryl suture was then used from above to close the fascia at the colostomy site.  The skin was closed with interrupted 3-0 Vicryl and staples, with placement of 1 inch iodoform packing in between the staples at the colostomy site.  The colostomy site was then dressed with honeycomb dressing.  The midline incision was dressed with Praveena wound VAC.  The patient tolerated the procedure well and was extubated and taken to the postoperative care unit in stable condition.  At the end of the procedure sponge and instrument counts were correct.  NG tube and Foley remained in place.

## 2018-11-28 NOTE — Anesthesia Post-op Follow-up Note (Signed)
Anesthesia QCDR form completed.        

## 2018-11-28 NOTE — Progress Notes (Signed)

## 2018-11-28 NOTE — OR Nursing (Signed)
Dr. Kayleen Memos notified of heart rate climbing to 120s and 130s he administered 5 mg metoprolol with 150 ml bolus of fluids, patient has no complaints of pain and is tolerating some ice chips for dry mouth/

## 2018-11-29 ENCOUNTER — Inpatient Hospital Stay
Admission: RE | Admit: 2018-11-29 | Discharge: 2018-11-29 | Disposition: A | Discharge status: Home / Self Care | Payer: Medicare Other | Source: Ambulatory Visit | Attending: Nurse Practitioner | Admitting: Nurse Practitioner

## 2018-11-29 LAB — CBC
HCT: 31.6 % — ABNORMAL LOW (ref 36.0–46.0)
Hemoglobin: 10.4 g/dL — ABNORMAL LOW (ref 12.0–15.0)
MCH: 28.3 pg (ref 26.0–34.0)
MCHC: 32.9 g/dL (ref 30.0–36.0)
MCV: 85.9 fL (ref 80.0–100.0)
Platelets: 231 K/uL (ref 150–400)
RBC: 3.68 MIL/uL — ABNORMAL LOW (ref 3.87–5.11)
RDW: 14.6 % (ref 11.5–15.5)
WBC: 13.8 K/uL — ABNORMAL HIGH (ref 4.0–10.5)
nRBC: 0 % (ref 0.0–0.2)

## 2018-11-29 LAB — BASIC METABOLIC PANEL
Anion gap: 9 (ref 5–15)
BUN: 13 mg/dL (ref 8–23)
CO2: 28 mmol/L (ref 22–32)
Calcium: 8.1 mg/dL — ABNORMAL LOW (ref 8.9–10.3)
Chloride: 95 mmol/L — ABNORMAL LOW (ref 98–111)
Creatinine, Ser: 0.97 mg/dL (ref 0.44–1.00)
GFR calc Af Amer: >60 mL/min (ref >60)
GFR calc non Af Amer: 55 mL/min — ABNORMAL LOW (ref >60)
Glucose, Bld: 141 mg/dL — ABNORMAL HIGH (ref 70–99)
Potassium: 3.6 mmol/L (ref 3.5–5.1)
Sodium: 132 mmol/L — ABNORMAL LOW (ref 135–145)

## 2018-11-29 LAB — MAGNESIUM: Magnesium: 1.7 mg/dL (ref 1.7–2.4)

## 2018-11-29 LAB — PHOSPHORUS: Phosphorus: 3.9 mg/dL (ref 2.5–4.6)

## 2018-11-29 MED ORDER — METOPROLOL TARTRATE 5 MG/5ML IV SOLN
5.0000 mg | INTRAVENOUS | Status: DC | PRN
  Administered 2018-11-29 (×2): 5 mg via INTRAVENOUS
  Filled 2018-11-29 (×2): qty 5

## 2018-11-29 MED ORDER — AMIODARONE LOAD VIA INFUSION
150.0000 mg | Freq: Once | INTRAVENOUS | Status: DC
  Filled 2018-11-29: qty 83.34

## 2018-11-29 MED ORDER — METOPROLOL TARTRATE 5 MG/5ML IV SOLN
5.0000 mg | Freq: Once | INTRAVENOUS | Status: AC
  Administered 2018-11-29: 5 mg via INTRAVENOUS
  Filled 2018-11-29: qty 5

## 2018-11-29 MED ORDER — METOPROLOL SUCCINATE ER 50 MG PO TB24
50.0000 mg | ORAL_TABLET | Freq: Two times a day (BID) | ORAL | Status: DC
  Administered 2018-11-29 – 2018-12-03 (×9): 50 mg via ORAL
  Filled 2018-11-29 (×9): qty 1

## 2018-11-29 MED ORDER — AMIODARONE HCL 200 MG PO TABS
200.0000 mg | ORAL_TABLET | Freq: Two times a day (BID) | ORAL | Status: DC
  Administered 2018-11-29 – 2018-12-03 (×8): 200 mg via ORAL
  Filled 2018-11-29 (×8): qty 1

## 2018-11-29 MED ORDER — METOPROLOL TARTRATE 5 MG/5ML IV SOLN: 5.0000 mg | Freq: Four times a day (QID) | INTRAVENOUS | Status: DC | PRN

## 2018-11-29 MED ORDER — AMIODARONE HCL IN DEXTROSE 360-4.14 MG/200ML-% IV SOLN
60.0000 mg/h | INTRAVENOUS | Status: DC
  Filled 2018-11-29: qty 200

## 2018-11-29 MED ORDER — AMIODARONE HCL IN DEXTROSE 360-4.14 MG/200ML-% IV SOLN
30.0000 mg/h | INTRAVENOUS | Status: DC
  Filled 2018-11-29 (×2): qty 200

## 2018-11-29 NOTE — Plan of Care (Signed)
Pt alert and oriented x 4. Compliant with medications. Denied any pain or discomfort when asked. PRN Metoprolol administered x 1 for sustained pulse > 110 with positive results. Afib on tele. Wound vac intact to abdomen. Will continue to monitor.   Problem: Health Behavior/Discharge Planning: Goal: Ability to manage health-related needs will improve Outcome: Progressing   Problem: Clinical Measurements: Goal: Ability to maintain clinical measurements within normal limits will improve Outcome: Progressing Goal: Will remain free from infection Outcome: Progressing Goal: Respiratory complications will improve Outcome: Progressing Goal: Cardiovascular complication will be avoided Outcome: Progressing   Problem: Elimination: Goal: Will not experience complications related to urinary retention Outcome: Progressing   Problem: Pain Managment: Goal: General experience of comfort will improve Outcome: Progressing

## 2018-11-29 NOTE — Consult Note (Signed)
[facility] NAME: Jody [name]    MR#:  [id number]  DATE OF BIRTH:  1940-11-24  DATE OF CONSULT:  11/29/2018  PRIMARY CARE PHYSICIAN: Baxter Hire, MD   REQUESTING/REFERRING PHYSICIAN: Benjamine Sprague, MD  CHIEF COMPLAINT:  No chief complaint on file.   HISTORY OF PRESENT ILLNESS:  Jody [name]  is a 78 y.o. female with a known history of diverticulitis with diverting colostomy.  She is now status post colostomy reversal with Dr.Sakai.  Patient has a history of atrial fibrillation, A. fib with RVR, valvular heart disease, hypertension, cardiomyopathy with chronic systolic dysfunction.  She is followed by Dr. Nehemiah Massed.  Arrhythmias are managed with digoxin, diltiazem, and metoprolol at home.  Hospitalist service has been consulted for postoperative atrial fibrillation with RVR with heart rate 120s to 130s.  She is on apixaban at home.  However she has been started on Lovenox postop.  Postoperatively in recovery patient received IV diltiazem and Lopressor.  Patient is lying in bed with no complaints.  She denies chest pain or palpitations.  She denies shortness of breath.  She denies pain.  Patient is afebrile.  Labs on yesterday 11/28/2018 at 06 40 with potassium of 4.7.  Repeat BMP and magnesium level is pending.  Hospitalist service will continue to follow along with this patient until disposition home.   PAST MEDICAL HISTORY:   Past Medical History:  Diagnosis Date  . Asthma   . Atrial fibrillation ([place])   . CHF (congestive heart failure) ([place])   . Dysrhythmia   . GERD (gastroesophageal reflux disease)   . Hypertension   . Primary adenocarcinoma of right lung ([place]) 02/2005   Rad tx's.     PAST SURGICAL HISTOIRY:   Past Surgical History:  Procedure Laterality Date  . COLOSTOMY N/A 01/05/2018   Procedure: COLOSTOMY;  Surgeon: Benjamine Sprague, DO;  Location: [facility] ORS;  Service: General;  Laterality: N/A;  . COLOSTOMY TAKEDOWN N/A  11/28/2018   Procedure: COLOSTOMY TAKEDOWN, DIABETIC;  Surgeon: Benjamine Sprague, DO;  Location: [facility] ORS;  Service: General;  Laterality: N/A;  . LAPAROTOMY N/A 01/05/2018   Procedure: EXPLORATORY LAPAROTOMY/ POSSIBLE BOWEL OBSTRUCTION;  Surgeon: Benjamine Sprague, DO;  Location: [facility] ORS;  Service: General;  Laterality: N/A;  . LYSIS OF ADHESION N/A 01/05/2018   Procedure: LYSIS OF ADHESION;  Surgeon: Benjamine Sprague, DO;  Location: [facility] ORS;  Service: General;  Laterality: N/A;    SOCIAL HISTORY:   Social History   Tobacco Use  . Smoking status: Never Smoker  . Smokeless tobacco: Never Used  Substance Use Topics  . Alcohol use: No    FAMILY HISTORY:   Family History  Problem Relation Age of Onset  . Breast cancer Paternal Aunt     DRUG ALLERGIES:   Allergies  Allergen Reactions  . Poison Oak Extract Other (See Comments)    blistering  . Ampicillin Rash  . Penicillin V Potassium Rash    Has patient had a PCN reaction causing immediate rash, facial/tongue/throat swelling, SOB or lightheadedness with hypotension: No Has patient had a PCN reaction causing severe rash involving mucus membranes or skin necrosis: No Has patient had a PCN reaction that required hospitalization: No Has patient had a PCN reaction occurring within the last 10 years: No If all of the above answers are "NO", then may proceed with Cephalosporin use.     REVIEW OF SYSTEMS:   ROS   MEDICATIONS AT HOME:   Prior  to Admission medications   Medication Sig Start Date End Date Taking? Authorizing Provider  albuterol (PROVENTIL) (2.5 MG/3ML) 0.083% nebulizer solution Take 2.5 mg by nebulization every 6 (six) hours as needed for wheezing or shortness of breath.   Yes [provider]  albuterol (VENTOLIN HFA) 108 (90 Base) MCG/ACT inhaler Inhale 2 puffs into the lungs every 6 (six) hours as needed for wheezing or shortness of breath.   Yes [provider]  alendronate (FOSAMAX) 70 MG tablet Take 70 mg  by mouth every Monday.    Yes [provider]  ALPRAZolam Duanne Moron) 0.5 MG tablet Take 0.5 mg by mouth at bedtime as needed for anxiety or sleep.    Yes [provider]  apixaban (ELIQUIS) 5 MG TABS tablet Take 5 mg by mouth 2 (two) times daily.   Yes [provider]  Calcium Carbonate-Vitamin D (CALCIUM 600+D) 600-200 MG-UNIT TABS Take 1 tablet by mouth daily.   Yes [provider]  digoxin (LANOXIN) 0.125 MG tablet Take 1 tablet (0.125 mg total) by mouth daily. 01/22/18  Yes Gladstone Lighter, MD  diltiazem (DILACOR XR) 240 MG 24 hr capsule Take 240 mg by mouth daily.   Yes [provider]  FLUoxetine (PROZAC) 20 MG capsule Take 20 mg by mouth daily.  02/20/14  Yes [provider]  furosemide (LASIX) 20 MG tablet Take 20 mg by mouth daily.   Yes [provider]  lovastatin (MEVACOR) 20 MG tablet Take 20 mg by mouth daily with supper.    Yes [provider]  metoprolol succinate (TOPROL XL) 50 MG 24 hr tablet Take 1 tablet (50 mg total) by mouth daily. Take with or immediately following a meal. Patient taking differently: Take 50 mg by mouth 2 (two) times a day.  02/10/15 11/24/18 Yes Daymon Larsen, MD  montelukast (SINGULAIR) 10 MG tablet Take 10 mg by mouth at bedtime.  08/23/14  Yes [provider]  Multiple Vitamin (MULTI-VITAMINS) TABS Take 1 tablet by mouth daily.    Yes [provider]  omeprazole (PRILOSEC) 20 MG capsule Take 20 mg by mouth daily.  08/15/14  Yes [provider]  ondansetron (ZOFRAN) 4 MG tablet Take 1 tablet (4 mg total) by mouth daily as needed. Patient taking differently: Take 4 mg by mouth daily as needed for nausea or vomiting.  03/30/18  Yes Schaevitz, Randall An, MD  potassium chloride (K-DUR) 10 MEQ tablet Take 10 mEq by mouth daily.   Yes [provider]  traZODone (DESYREL) 50 MG tablet Take 50 mg by mouth at bedtime. 02/17/18 02/17/19 Yes [provider]  amiodarone (PACERONE) 200 MG tablet Take 1 tablet (200 mg total) by mouth 2 (two) times daily. Patient not taking: Reported on 11/21/2018 01/22/18   Gladstone Lighter, MD      VITAL SIGNS:  Blood pressure 115/87, pulse (!) 106, temperature 98.2 F (36.8 C), temperature source Oral, resp. rate 18, height 4\' 11"  (1.499 m), weight 70.3 kg, SpO2 95 %.  PHYSICAL EXAMINATION:  GENERAL:  78 y.o.-year-old patient lying in the bed with no acute distress.  EYES: Pupils equal, round, reactive to light and accommodation. No scleral icterus. Extraocular muscles intact.  HEENT: Head atraumatic, normocephalic. Right NG tube draining dark secretions. NECK:  Supple, no jugular venous distention. No thyroid enlargement, no tenderness.  LUNGS: Normal breath sounds bilaterally, no wheezing, rales, rhonchi . No use of accessory muscles of respiration.  CARDIOVASCULAR: S1, S2,Irregular rate, irregular rhythm. No murmurs,  rubs, gallops, clicks.  ABDOMEN: Soft, nontender, nondistended. Bowel sounds hypoactive. No organomegaly or mass.  EXTREMITIES: No pedal edema, cyanosis, or clubbing.  NEUROLOGIC: Cranial nerves II through XII are intact. No focal motor or sensory deficits appreciated bilaterally  PSYCHIATRIC: The patient is alert and oriented x 3. Good affect SKIN: No obvious rash, lesion, or ulcer.   LABORATORY PANEL:   CBC Recent Labs  Lab 11/28/18 1703  WBC 17.1*  HGB 11.6*  HCT 35.3*  PLT 203   ------------------------------------------------------------------------------------------------------------------  Chemistries  Recent Labs  Lab 11/28/18 0640 11/28/18 1703  NA 130*  --   K 4.7  --   GLUCOSE 111*  --   CREATININE  --  1.06*   ------------------------------------------------------------------------------------------------------------------  Cardiac Enzymes No results for input(s): TROPONINI in the last 168  hours. ------------------------------------------------------------------------------------------------------------------  RADIOLOGY:  No results found.   IMPRESSION AND PLAN:   1.  Atrial fibrillation with RVR - We will treat with IV Lopressor and continue to monitor closely - Patient is on Lovenox anticoagulation therapy - Patient's home medications with digoxin, diltiazem, and metoprolol have been continued - We will get echocardiogram-last echocardiogram on 01/10/2018 with 40 to 45% ejection fraction -We will consult cardiology for further evaluation and recommendations-patient sees Dr. Nehemiah Massed outpatient - BMP, magnesium labs pending  2.  Status post colostomy reversal - NG tube draining dark secretions to reservoir - No complaints of pain - No nausea  3.  Hypertension - Controlled -Patient's home medications with diltiazem, metoprolol, and Lasix have been continued  4.  GERD - Patient is on PPI therapy  We thank you for this consultation and we will continue to follow along with you during this patient's hospitalization until disposition home.    All the records are reviewed and case discussed with Consulting provider. Management plans discussed with the patient, family and they are in agreement.  CODE STATUS: Full Code  TOTAL TIME TAKING CARE OF THIS PATIENT: 45 minutes.    Aquia Harbour on 11/29/2018 at 4:13 AM  Between 7am to 6pm - Pager - [phone]  After 6pm go to [email or web address] - password [email or web address]  [facility]  Office  [phone]  CC: Primary care Physician: Baxter Hire, MD

## 2018-11-29 NOTE — Anesthesia Postprocedure Evaluation (Signed)
Anesthesia Post Note  Patient: Jody [name]  Procedure(s) Performed: COLOSTOMY TAKEDOWN, DIABETIC (N/A )  Patient location during evaluation: PACU Anesthesia Type: General Level of consciousness: awake and alert Pain management: pain level controlled Vital Signs Assessment: post-procedure vital signs reviewed and stable Respiratory status: spontaneous breathing, nonlabored ventilation, respiratory function stable and patient connected to nasal cannula oxygen Cardiovascular status: blood pressure returned to baseline and stable Postop Assessment: no apparent nausea or vomiting Anesthetic complications: no     Last Vitals:  Vitals:   11/29/18 0405 11/29/18 0421  BP: 115/87 101/78  Pulse: (!) 106 (!) 120  Resp:  18  Temp:  37 C  SpO2:  94%    Last Pain:  Vitals:   11/29/18 0421  TempSrc: Oral  PainSc:                  [name]

## 2018-11-29 NOTE — Consult Note (Signed)
Reason for Consult: Atrial fibrillation rapid ventricular response, cardiomyopathy Referring Physician: Gardiner Barefoot NP [name] primary Cardiologist [name] is an 78 y.o. female.  HPI: Patient is a 78 year old female history of diverticulitis with a colostomy in place recently had it reversed and developed postop rapid atrial fibrillation patient has a history of chronic A. fib on amiodarone rate control with digoxin metoprolol diltiazem anticoagulation with Eliquis patient is asymptomatic does not notice any palpitations or tachycardia no chest pain no shortness of breath heart rates about 130 cardiology was consulted for rate management  Past Medical History:  Diagnosis Date  . Asthma   . Atrial fibrillation ([place])   . CHF (congestive heart failure) ([place])   . Dysrhythmia   . GERD (gastroesophageal reflux disease)   . Hypertension   . Primary adenocarcinoma of right lung ([place]) 02/2005   Rad tx's.     Past Surgical History:  Procedure Laterality Date  . COLOSTOMY N/A 01/05/2018   Procedure: COLOSTOMY;  Surgeon: Benjamine Sprague, DO;  Location: [facility] ORS;  Service: General;  Laterality: N/A;  . COLOSTOMY TAKEDOWN N/A 11/28/2018   Procedure: COLOSTOMY TAKEDOWN, DIABETIC;  Surgeon: Benjamine Sprague, DO;  Location: [facility] ORS;  Service: General;  Laterality: N/A;  . LAPAROTOMY N/A 01/05/2018   Procedure: EXPLORATORY LAPAROTOMY/ POSSIBLE BOWEL OBSTRUCTION;  Surgeon: Benjamine Sprague, DO;  Location: [facility] OR[facility];  Service: General;  Laterality: N/A;  . LYSIS OF ADHESION N/A 01/05/2018   Procedure: LYSIS OF ADHESION;  Surgeon: Benjamine Sprague, DO;  Location: [facility] ORS;  Service: General;  Laterality: N/A;    Family History  Problem Relation Age of Onset  . Breast cancer Paternal Aunt     Social History:  reports that she has never smoked. She has never used smokeless tobacco. She reports that she does not drink alcohol or use drugs.  Allergies:  Allergies  Allergen Reactions  .  Poison Oak Extract Other (See Comments)    blistering  . Ampicillin Rash  . Penicillin V Potassium Rash    Has patient had a PCN reaction causing immediate rash, facial/tongue/throat swelling, SOB or lightheadedness with hypotension: No Has patient had a PCN reaction causing severe rash involving mucus membranes or skin necrosis: No Has patient had a PCN reaction that required hospitalization: No Has patient had a PCN reaction occurring within the last 10 years: No If all of the above answers are "NO", then may proceed with Cephalosporin use.     Medications: I have reviewed the patient's current medications.  Results for orders placed or performed during the hospital encounter of 11/28/18 (from the past 48 hour(s))  I-STAT 4, (NA,K, GLUC, HGB,HCT)     Status: Abnormal   Collection Time: 11/28/18  6:40 AM  Result Value Ref Range   Sodium 130 (L) 135 - 145 mmol/L   Potassium 4.7 3.5 - 5.1 mmol/L   Glucose, Bld 111 (H) 70 - 99 mg/dL   HCT 34.0 (L) 36.0 - 46.0 %   Hemoglobin 11.6 (L) 12.0 - 15.0 g/dL  CBC     Status: Abnormal   Collection Time: 11/28/18  5:03 PM  Result Value Ref Range   WBC 17.1 (H) 4.0 - 10.5 K/uL   RBC 4.10 3.87 - 5.11 MIL/uL   Hemoglobin 11.6 (L) 12.0 - 15.0 g/dL   HCT 35.3 (L) 36.0 - 46.0 %   MCV 86.1 80.0 - 100.0 fL   MCH 28.3 26.0 - 34.0 pg   MCHC 32.9 30.0 -  36.0 g/dL   RDW 14.6 11.5 - 15.5 %   Platelets 203 150 - 400 K/uL   nRBC 0.0 0.0 - 0.2 %    Comment: Performed at [facility], [address] [place], [place] [address]  Creatinine, serum     Status: Abnormal   Collection Time: 11/28/18  5:03 PM  Result Value Ref Range   Creatinine, Ser 1.06 (H) 0.44 - 1.00 mg/dL   GFR calc non Af Amer 50 (L) >60 mL/min   GFR calc Af Amer 57 (L) >60 mL/min    Comment: Performed at [facility], [address] [place], [place] [address]  Basic metabolic panel     Status: Abnormal   Collection Time: 11/29/18  3:46 AM  Result Value Ref Range    Sodium 132 (L) 135 - 145 mmol/L   Potassium 3.6 3.5 - 5.1 mmol/L   Chloride 95 (L) 98 - 111 mmol/L   CO2 28 22 - 32 mmol/L   Glucose, Bld 141 (H) 70 - 99 mg/dL   BUN 13 8 - 23 mg/dL   Creatinine, Ser 0.97 0.44 - 1.00 mg/dL   Calcium 8.1 (L) 8.9 - 10.3 mg/dL   GFR calc non Af Amer 55 (L) >60 mL/min   GFR calc Af Amer >60 >60 mL/min   Anion gap 9 5 - 15    Comment: Performed at [facility], [address] [place], [place] [address]  CBC     Status: Abnormal   Collection Time: 11/29/18  3:46 AM  Result Value Ref Range   WBC 13.8 (H) 4.0 - 10.5 K/uL   RBC 3.68 (L) 3.87 - 5.11 MIL/uL   Hemoglobin 10.4 (L) 12.0 - 15.0 g/dL   HCT 31.6 (L) 36.0 - 46.0 %   MCV 85.9 80.0 - 100.0 fL   MCH 28.3 26.0 - 34.0 pg   MCHC 32.9 30.0 - 36.0 g/dL   RDW 14.6 11.5 - 15.5 %   Platelets 231 150 - 400 K/uL   nRBC 0.0 0.0 - 0.2 %    Comment: Performed at [facility], [address], [place], [place] [address]  Magnesium     Status: None   Collection Time: 11/29/18  3:46 AM  Result Value Ref Range   Magnesium 1.7 1.7 - 2.4 mg/dL    Comment: Performed at [facility], [address] [place], [place] [address]  Phosphorus     Status: None   Collection Time: 11/29/18  3:46 AM  Result Value Ref Range   Phosphorus 3.9 2.5 - 4.6 mg/dL    Comment: Performed at [facility], [address] [place], [place] [address]    No results found.  Review of Systems  Constitutional: Positive for diaphoresis and malaise/fatigue.  HENT: Positive for congestion.   Eyes: Negative.   Respiratory: Positive for shortness of breath.   Cardiovascular: Positive for orthopnea and leg swelling.  Gastrointestinal: Positive for abdominal pain.  Genitourinary: Negative.   Musculoskeletal: Negative.   Skin: Negative.   Neurological: Positive for dizziness and weakness.  Endo/Heme/Allergies: Negative.   Psychiatric/Behavioral: Negative.    Blood pressure 120/81, pulse (!) 152,  temperature 98.6 F (37 C), temperature source Oral, resp. rate 20, height 4\' 11"  (1.499 m), weight 70.3 kg, SpO2 94 %. Physical Exam  Nursing note and vitals reviewed. Constitutional: She is oriented to person, place, and time. She appears well-developed and well-nourished.  HENT:  Head: Normocephalic and atraumatic.  Eyes: Pupils are  equal, round, and reactive to light. Conjunctivae and EOM are normal.  Neck: Normal range of motion. Neck supple.  Cardiovascular: Normal rate, regular rhythm and normal heart sounds.  Respiratory: Effort normal and breath sounds normal.  GI: Soft. Bowel sounds are normal.  Musculoskeletal: Normal range of motion.  Neurological: She is alert and oriented to person, place, and time. She has normal reflexes.  Skin: Skin is warm and dry.  Psychiatric: She has a normal mood and affect.    Assessment/Plan: Atrial fibrillation Postop colostomy reversal Diverticulitis Hypertension Cardiomyopathy Congestive heart failure systolic dysfunction Obesity Asthma GERD History of adenocarcinoma of the right lung status post radiation . Plan Recommend rate control for atrial fibrillation Cardizem metoprolol amiodarone digoxin Continue NG tube suction Hypertension control diltiazem and metoprolol Maintain PPI therapy for GERD Consider echocardiogram for further assessment previous cardiomyopathy ejection fraction around 40 to 45% Recommend conservative cardiac input and just work on rate control Short-term anticoagulation with Lovenox hopefully transition to  Eliquis   Jamina Macbeth D Kennah Hehr 11/29/2018, 11:27 AM

## 2018-11-29 NOTE — Progress Notes (Signed)
Subjective:  CC: Jody [name] is a 78 y.o. female  Hospital stay day 1, 1 Day Post-Op colostomy reversal  HPI: No acute complaints.    ROS:  General: Denies weight loss, weight gain, fatigue, fevers, chills, and night sweats. Heart: Denies chest pain, palpitations, racing heart, irregular heartbeat, leg pain or swelling, and decreased activity tolerance. Respiratory: Denies breathing difficulty, shortness of breath, wheezing, cough, and sputum. GI: Denies change in appetite, heartburn, nausea, vomiting, constipation, diarrhea, and blood in stool. GU: Denies difficulty urinating, pain with urinating, urgency, frequency, blood in urine.   Objective:   Temp:  [97.8 F (36.6 C)-98.9 F (37.2 C)] 98.7 F (37.1 C) (06/24 1656) Pulse Rate:  [102-152] 110 (06/24 1656) Resp:  [16-28] 20 (06/24 1222) BP: (100-132)/(64-89) 125/64 (06/24 1656) SpO2:  [91 %-99 %] 91 % (06/24 1656) Weight:  [65.6 kg] 65.6 kg (06/24 1656)     Height: 4\' 11"  (149.9 cm) Weight: 65.6 kg BMI (Calculated): 29.19   Intake/Output this shift:   Intake/Output Summary (Last 24 hours) at 11/29/2018 1725 Last data filed at 11/29/2018 1622 Gross per 24 hour  Intake 1690.28 ml  Output 840 ml  Net 850.28 ml    Constitutional :  alert, cooperative, appears stated age and no distress  Gastrointestinal: appropriate tenderness around suprapubic region and along incision site.  NG with minimial bilious output, JP with serosanguinous drainage, prevena wound vac intact and dressing over former ostomy site intact.   Skin: Cool and moist.   Psychiatric: Normal affect, non-agitated, not confused       LABS:  CMP Latest Ref Rng & Units 11/29/2018 11/28/2018 03/30/2018  Glucose 70 - 99 mg/dL 141(H) 111(H) 135(H)  BUN 8 - 23 mg/dL 13 - 13  Creatinine 0.44 - 1.00 mg/dL 0.97 1.06(H) 1.28(H)  Sodium 135 - 145 mmol/L 132(L) 130(L) 133(L)  Potassium 3.5 - 5.1 mmol/L 3.6 4.7 3.5  Chloride 98 - 111 mmol/L 95(L) - 97(L)  CO2 22 - 32  mmol/L 28 - 26  Calcium 8.9 - 10.3 mg/dL 8.1(L) - 9.3  Total Protein 6.5 - 8.1 g/dL - - 7.6  Total Bilirubin 0.3 - 1.2 mg/dL - - 1.1  Alkaline Phos 38 - 126 U/L - - 64  AST 15 - 41 U/L - - 27  ALT 0 - 44 U/L - - 26   CBC Latest Ref Rng & Units 11/29/2018 11/28/2018 11/28/2018  WBC 4.0 - 10.5 K/uL 13.8(H) 17.1(H) -  Hemoglobin 12.0 - 15.0 g/dL 10.4(L) 11.6(L) 11.6(L)  Hematocrit 36.0 - 46.0 % 31.6(L) 35.3(L) 34.0(L)  Platelets 150 - 400 K/uL 231 203 -    RADS: n/a Assessment:   S/p colostomy reveresal.  Doing well.  Clamp NG and check residual to see any issues.  Consider CLD if minimal residual.  Chronic a-fib.  Appreciate cardiology and hospitalist consult.  Will likely resume eliquis tomorrow of hgb stable and clinically continues to improve.  Remove foley

## 2018-11-29 NOTE — Progress Notes (Signed)
Dr. Lysle Pearl notified about pt's heart rate ranging from 120's to 130's. Received order for hospitalist consult for afib with rvr. Informed the Dr. that the consult must be completed by the Dr.  No further orders received at this time. Will continue to monitor.

## 2018-11-29 NOTE — Progress Notes (Signed)
[facility] [place] NAME: Jody [name]    MR#:  [id number]  DATE OF BIRTH:  1940/10/20  SUBJECTIVE:   Patient states she is feeling okay this morning.  She does endorse some abdominal pain, but states that it is moderate.  She denies any palpitations, chest pain, shortness of breath.  REVIEW OF SYSTEMS:  Review of Systems  Constitutional: Negative for chills and fever.  HENT: Negative for congestion and sore throat.   Eyes: Negative for blurred vision and double vision.  Respiratory: Negative for cough and hemoptysis.   Cardiovascular: Negative for chest pain and palpitations.  Gastrointestinal: Positive for abdominal pain. Negative for nausea and vomiting.  Genitourinary: Negative for dysuria and urgency.  Musculoskeletal: Negative for back pain and neck pain.  Neurological: Negative for dizziness and headaches.  Psychiatric/Behavioral: Negative for depression. The patient is not nervous/anxious.     DRUG ALLERGIES:   Allergies  Allergen Reactions  . Poison Oak Extract Other (See Comments)    blistering  . Ampicillin Rash  . Penicillin V Potassium Rash    Has patient had a PCN reaction causing immediate rash, facial/tongue/throat swelling, SOB or lightheadedness with hypotension: No Has patient had a PCN reaction causing severe rash involving mucus membranes or skin necrosis: No Has patient had a PCN reaction that required hospitalization: No Has patient had a PCN reaction occurring within the last 10 years: No If all of the above answers are "NO", then may proceed with Cephalosporin use.    VITALS:  Blood pressure 132/89, pulse (!) 134, temperature 98.9 F (37.2 C), temperature source Oral, resp. rate 20, height 4\' 11"  (1.499 m), weight 70.3 kg, SpO2 95 %. PHYSICAL EXAMINATION:  Physical Exam  GENERAL:  Laying in the bed with no acute distress.  HEENT: Head atraumatic, normocephalic. Pupils equal, round, reactive to light  and accommodation. No scleral icterus. Extraocular muscles intact. Oropharynx and nasopharynx clear.  NECK:  Supple, no jugular venous distention. No thyroid enlargement. LUNGS: Lungs are clear to auscultation bilaterally. No wheezes, crackles, rhonchi. No use of accessory muscles of respiration.  CARDIOVASCULAR: Irregularly irregular rhythm, tachycardic, S1, S2 normal. No murmurs, rubs, or gallops.  ABDOMEN: + Abdominal incision with wound VAC in place, + diffuse tenderness to palpation, no rebound or guarding. EXTREMITIES: No pedal edema, cyanosis, or clubbing.  NEUROLOGIC: CN 2-12 intact, no focal deficits. 5/5 muscle strength throughout all extremities. Sensation intact throughout. Gait not checked.  PSYCHIATRIC: The patient is alert and oriented x 3.  SKIN: No obvious rash, lesion, or ulcer.  LABORATORY PANEL:  Female CBC Recent Labs  Lab 11/29/18 0346  WBC 13.8*  HGB 10.4*  HCT 31.6*  PLT 231   ------------------------------------------------------------------------------------------------------------------ Chemistries  Recent Labs  Lab 11/29/18 0346  NA 132*  K 3.6  CL 95*  CO2 28  GLUCOSE 141*  BUN 13  CREATININE 0.97  CALCIUM 8.1*  MG 1.7   RADIOLOGY:  No results found. ASSESSMENT AND PLAN:   Chronic atrial fibrillation with RVR- heart rates remain elevated this morning -Increase metoprolol back to her home metoprolol XL 50 mg twice daily -Add IV Lopressor 5 mg as needed HR > 110 -Continue home digoxin and diltiazem -Echo pending [facility] Cardiology following -Continue Lovenox for now, with plan to transition back to home Eliquis when able  History of diverticulitis s/p colostomy reversal 6/23 -NG tube in place -IV pain medicines  Hypertension-blood pressures controlled -Continue home diltiazem, metoprolol, and Lasix  GERD-  stable -Continue PPI  Cardiology is following for rate control, so hospitalists will sign off at this time. Please re-consult if any  additional needs arise.  All the records are reviewed and case discussed with Care Management/Social Worker. Management plans discussed with the patient, family and they are in agreement.  CODE STATUS: Full Code  TOTAL TIME TAKING CARE OF THIS PATIENT: 40 minutes.   More than 50% of the time was spent in counseling/coordination of care: YES  POSSIBLE D/C UNKNOWN, DEPENDING ON CLINICAL CONDITION.   Berna Spare Mayo M.D on 11/29/2018 at 1:41 PM  Between 7am to 6pm - Pager [phone]  After 6pm go to [email or web address] - Proofreader  [facility]  Office  [phone]  CC: Primary care physician; Baxter Hire, MD  Note: This dictation was prepared with Dragon dictation along with smaller phrase technology. Any transcriptional errors that result from this process are unintentional.

## 2018-11-29 NOTE — Progress Notes (Signed)
Report called to Janett Billow, RN and patient transported to Rm 254.  Belongings transferred with patient including clothing, cell phone and charger.  Called patient's husband with update.

## 2018-11-29 NOTE — Progress Notes (Signed)
*  PRELIMINARY RESULTS* Echocardiogram 2D Echocardiogram has been performed.  Sherrie Sport 11/29/2018, 11:02 AM

## 2018-11-30 LAB — SURGICAL PATHOLOGY

## 2018-11-30 NOTE — Evaluation (Signed)
Physical Therapy Evaluation Patient Details Name: Jody [name] MRN: [id number] DOB: May 18, 1941 Today's Date: 11/30/2018   History of Present Illness  Patient is an 78 y/o female that presents for colostomy reversal on 6/23. She has since developed A-fib with RVR. She currently has wound vac and JP drain from abdomen.  Clinical Impression  Patient is an 78 y/o female that has largely been modified independent in her home environment with a RW in the home. She has caregivers coming throughout the day to assist with herself and her husband, who has recently had a surgery as well. She is able to perform bed mobility and transfers with minimal to no assistance from therapist this date, however ambulation distance was limited due to elevated HR for patient at baseline (110s to 120s in bed). She had no difficulty in ambulating from bed to chair with RW, and will be progressed in follow up treatment sessions for gait distance as HR is normalized. Given the anticipation she will continue to progress with strength/mobility as demonstrated in this session she would likely benefit from [place] after discharge.     Follow Up Recommendations Home health PT    Equipment Recommendations  Rolling walker with 5" wheels    Recommendations for Other Services       Precautions / Restrictions Precautions Precautions: Fall Restrictions Weight Bearing Restrictions: No      Mobility  Bed Mobility Overal bed mobility: Needs Assistance Bed Mobility: Supine to Sit     Supine to sit: Min assist     General bed mobility comments: Patient able to log roll, but needs very minimal assistance to bring torso upright.  Transfers Overall transfer level: Needs assistance Equipment used: Rolling walker (2 wheeled) Transfers: Sit to/from Stand Sit to Stand: Supervision         General transfer comment: No assistance with strength needed.  Ambulation/Gait Ambulation/Gait assistance: Supervision Gait Distance  (Feet): 3 Feet Assistive device: Rolling walker (2 wheeled) Gait Pattern/deviations: WFL(Within Functional Limits)   Gait velocity interpretation: <1.31 ft/sec, indicative of household ambulator General Gait Details: No overt deficits in gait, though deferred prolonged ambulation due to elevated resting HR.  Stairs            Wheelchair Mobility    Modified Rankin (Stroke Patients Only)       Balance Overall balance assessment: Modified Independent                                           Pertinent Vitals/Pain Pain Assessment: Faces Faces Pain Scale: Hurts little more Pain Location: Abdomen Pain Descriptors / Indicators: Aching;Operative site guarding Pain Intervention(s): Limited activity within patient's tolerance;Monitored during session    [place] expects to be discharged to:: Private residence Living Arrangements: Spouse/significant other Available Help at Discharge: Family Type of Home: House Home Access: Stairs to enter   [employer] of Steps: 2 steps no railing garage to mud room plus 4 steps with L railing mudroom to living area Home Layout: One level Home Equipment: Grab bars - tub/shower;Shower seat - built in      Prior Function Level of Independence: Independent         Comments: Patient reports she has an aide help throughout the day and family at night. Her husband is currently in a wheelchair.     Hand Dominance  Extremity/Trunk Assessment   Upper Extremity Assessment Upper Extremity Assessment: Overall WFL for tasks assessed    Lower Extremity Assessment Lower Extremity Assessment: Overall WFL for tasks assessed       Communication   Communication: No difficulties  Cognition Arousal/Alertness: Awake/alert Behavior During Therapy: WFL for tasks assessed/performed Overall Cognitive Status: Within Functional Limits for tasks assessed                                         General Comments General comments (skin integrity, edema, etc.): Drains in place throughout session.    Exercises     Assessment/Plan    PT Assessment Patient needs continued PT services  PT Problem List Decreased strength;Cardiopulmonary status limiting activity;Decreased balance;Decreased activity tolerance;Decreased mobility       PT Treatment Interventions DME instruction;Therapeutic exercise;Gait training;Balance training;Stair training;Therapeutic activities;Patient/family education    PT Goals (Current goals can be found in the Care Plan section)  Acute Rehab PT Goals Patient Stated Goal: To return home safely. PT Goal Formulation: With patient Time For Goal Achievement: 12/14/18 Potential to Achieve Goals: Good    Frequency Min 2X/week   Barriers to discharge Decreased caregiver support Her husband is also post-operative and is in a w/c per patient.    Co-evaluation               AM-PAC PT "6 Clicks" Mobility  Outcome Measure Help needed turning from your back to your side while in a flat bed without using bedrails?: A Little Help needed moving from lying on your back to sitting on the side of a flat bed without using bedrails?: A Little Help needed moving to and from a bed to a chair (including a wheelchair)?: None Help needed standing up from a chair using your arms (e.g., wheelchair or bedside chair)?: None Help needed to walk in hospital room?: A Little Help needed climbing 3-5 steps with a railing? : A Little 6 Click Score: 20    End of Session Equipment Utilized During Treatment: Gait belt Activity Tolerance: Patient tolerated treatment well Patient left: with chair alarm set;in chair;with call bell/phone within reach Nurse Communication: Mobility status PT Visit Diagnosis: Muscle weakness (generalized) (M62.81)    Time: 5852-7782 PT Time Calculation (min) (ACUTE ONLY): 21 min   Charges:   PT Evaluation $PT Eval Moderate  Complexity: 1 Mod      Royce Macadamia PT, DPT, CSCS      11/30/2018, 4:23 PM

## 2018-11-30 NOTE — Progress Notes (Signed)
No output in wound vac this shift,50 ml output from JP drain

## 2018-11-30 NOTE — Progress Notes (Signed)
Patient is status post colostomy reversal,JP drain in place with sanguinous output,mid abdominal incision with wound vac in place and functional,denies pain,vital signs within normal limits.

## 2018-12-01 LAB — CBC WITH DIFFERENTIAL/PLATELET
Abs Immature Granulocytes: 0.04 10*3/uL (ref 0.00–0.07)
Basophils Absolute: 0 10*3/uL (ref 0.0–0.1)
Basophils Relative: 0 %
Eosinophils Absolute: 0 10*3/uL (ref 0.0–0.5)
Eosinophils Relative: 0 %
HCT: 34.3 % — ABNORMAL LOW (ref 36.0–46.0)
Hemoglobin: 11.2 g/dL — ABNORMAL LOW (ref 12.0–15.0)
Immature Granulocytes: 0 %
Lymphocytes Relative: 3 %
Lymphs Abs: 0.4 10*3/uL — ABNORMAL LOW (ref 0.7–4.0)
MCH: 28.4 pg (ref 26.0–34.0)
MCHC: 32.7 g/dL (ref 30.0–36.0)
MCV: 86.8 fL (ref 80.0–100.0)
Monocytes Absolute: 0.5 10*3/uL (ref 0.1–1.0)
Monocytes Relative: 4 %
Neutro Abs: 10 10*3/uL — ABNORMAL HIGH (ref 1.7–7.7)
Neutrophils Relative %: 93 %
Platelets: 238 10*3/uL (ref 150–400)
RBC: 3.95 MIL/uL (ref 3.87–5.11)
RDW: 14.3 % (ref 11.5–15.5)
WBC: 11 10*3/uL — ABNORMAL HIGH (ref 4.0–10.5)
nRBC: 0 % (ref 0.0–0.2)

## 2018-12-01 LAB — BASIC METABOLIC PANEL
Anion gap: 10 (ref 5–15)
BUN: 11 mg/dL (ref 8–23)
CO2: 31 mmol/L (ref 22–32)
Calcium: 9.2 mg/dL (ref 8.9–10.3)
Chloride: 95 mmol/L — ABNORMAL LOW (ref 98–111)
Creatinine, Ser: 0.93 mg/dL (ref 0.44–1.00)
GFR calc Af Amer: >60 mL/min (ref >60)
GFR calc non Af Amer: 58 mL/min — ABNORMAL LOW (ref >60)
Glucose, Bld: 135 mg/dL — ABNORMAL HIGH (ref 70–99)
Potassium: 4.5 mmol/L (ref 3.5–5.1)
Sodium: 136 mmol/L (ref 135–145)

## 2018-12-01 LAB — MAGNESIUM: Magnesium: 2 mg/dL (ref 1.7–2.4)

## 2018-12-01 LAB — PHOSPHORUS: Phosphorus: 2.8 mg/dL (ref 2.5–4.6)

## 2018-12-01 MED ORDER — ENOXAPARIN SODIUM 60 MG/0.6ML ~~LOC~~ SOLN
60.0000 mg | Freq: Two times a day (BID) | SUBCUTANEOUS | Status: DC
  Administered 2018-12-01: 21:00:00 60 mg via SUBCUTANEOUS
  Filled 2018-12-01 (×2): qty 0.6

## 2018-12-01 NOTE — Care Management Important Message (Signed)
Important Message  Patient Details  Name: Jody [name] MRN: [id number] Date of Birth: 1940/09/13   Medicare Important Message Given:  Yes     Dannette Barbara 12/01/2018, 10:58 AM

## 2018-12-01 NOTE — Plan of Care (Signed)
  Problem: Health Behavior/Discharge Planning: Goal: Ability to manage health-related needs will improve Outcome: Progressing   Problem: Clinical Measurements: Goal: Ability to maintain clinical measurements within normal limits will improve Outcome: Progressing Goal: Will remain free from infection Outcome: Progressing Goal: Diagnostic test results will improve Outcome: Progressing Goal: Respiratory complications will improve Outcome: Progressing   Problem: Coping: Goal: Level of anxiety will decrease Outcome: Progressing   Problem: Elimination: Goal: Will not experience complications related to urinary retention Outcome: Progressing   Problem: Pain Managment: Goal: General experience of comfort will improve Outcome: Progressing

## 2018-12-01 NOTE — Progress Notes (Addendum)
Subjective:  CC: Jody [name] is a 78 y.o. female  Hospital stay day 3, 3 Days Post-Op colostomy reversal  HPI: Some increase phlegm last night.  Only had clear liquid diet for some reason  ROS:  General: Denies weight loss, weight gain, fatigue, fevers, chills, and night sweats. Heart: Denies chest pain, palpitations, racing heart, irregular heartbeat, leg pain or swelling, and decreased activity tolerance. Respiratory: Denies breathing difficulty, shortness of breath, wheezing, cough, and sputum. GI: Denies change in appetite, heartburn, nausea, vomiting, constipation, diarrhea, and blood in stool. GU: Denies difficulty urinating, pain with urinating, urgency, frequency, blood in urine.   Objective:   Temp:  [97.9 F (36.6 C)-98.5 F (36.9 C)] 97.9 F (36.6 C) (06/26 0734) Pulse Rate:  [50-122] 50 (06/26 0734) Resp:  [20] 20 (06/26 0410) BP: (122-147)/(77-95) 144/83 (06/26 0734) SpO2:  [92 %-96 %] 95 % (06/26 0734) Weight:  [65.9 kg] 65.9 kg (06/26 0410)     Height: 4\' 11"  (149.9 cm) Weight: 65.9 kg BMI (Calculated): 29.33   Intake/Output this shift:   Intake/Output Summary (Last 24 hours) at 12/01/2018 0750 Last data filed at 12/01/2018 0600 Gross per 24 hour  Intake 1500 ml  Output 1480 ml  Net 20 ml    Constitutional :  alert, cooperative, appears stated age and no distress  Respiratory:  clear to auscultation bilaterally  Cardiovascular:  irregularly irregular rhythm  Gastrointestinal: soft, no guarding, increased distention today.   .   Skin: Cool and moist. praveena vac still in place, ostomy site clean, JP with serosanguinous fluid  Psychiatric: Normal affect, non-agitated, not confused       LABS:  CMP Latest Ref Rng & Units 11/29/2018 11/28/2018 03/30/2018  Glucose 70 - 99 mg/dL 141(H) 111(H) 135(H)  BUN 8 - 23 mg/dL 13 - 13  Creatinine 0.44 - 1.00 mg/dL 0.97 1.06(H) 1.28(H)  Sodium 135 - 145 mmol/L 132(L) 130(L) 133(L)  Potassium 3.5 - 5.1 mmol/L 3.6 4.7  3.5  Chloride 98 - 111 mmol/L 95(L) - 97(L)  CO2 22 - 32 mmol/L 28 - 26  Calcium 8.9 - 10.3 mg/dL 8.1(L) - 9.3  Total Protein 6.5 - 8.1 g/dL - - 7.6  Total Bilirubin 0.3 - 1.2 mg/dL - - 1.1  Alkaline Phos 38 - 126 U/L - - 64  AST 15 - 41 U/L - - 27  ALT 0 - 44 U/L - - 26   CBC Latest Ref Rng & Units 11/29/2018 11/28/2018 11/28/2018  WBC 4.0 - 10.5 K/uL 13.8(H) 17.1(H) -  Hemoglobin 12.0 - 15.0 g/dL 10.4(L) 11.6(L) 11.6(L)  Hematocrit 36.0 - 46.0 % 31.6(L) 35.3(L) 34.0(L)  Platelets 150 - 400 K/uL 231 203 -    RADS: n/a Assessment:   S/p colostomy reversal.  Increased distention today, still no flatus or BM.  Will keep NPO for today and continue to monitor for possible ileus.  Encouraged ambulation and OOB to chair in meantime.  Ice chips ok for now.  ADDENDUM:  Passing flatus now, distention slightly better.  Will try clears again and advance slowly.  Therapeutic lovenox started in meantime.  Will transition back to eliquiss after JP drain removal.

## 2018-12-01 NOTE — Progress Notes (Signed)
Subjective:  CC: Jody [name] is a 78 y.o. female  Hospital stay day 2,  2 days post-op colostomy reversal  HPI: Doing well.  No flatus of BM  ROS:  General: Denies weight loss, weight gain, fatigue, fevers, chills, and night sweats. Heart: Denies chest pain, palpitations, racing heart, irregular heartbeat, leg pain or swelling, and decreased activity tolerance. Respiratory: Denies breathing difficulty, shortness of breath, wheezing, cough, and sputum. GI: Denies change in appetite, heartburn, nausea, vomiting, constipation, diarrhea, and blood in stool. GU: Denies difficulty urinating, pain with urinating, urgency, frequency, blood in urine.   Objective:   Temp:  [97.9 F (36.6 C)-98.5 F (36.9 C)] 97.9 F (36.6 C) (06/26 0734) Pulse Rate:  [50-122] 50 (06/26 0734) Resp:  [20] 20 (06/26 0410) BP: (122-147)/(77-95) 144/83 (06/26 0734) SpO2:  [92 %-96 %] 95 % (06/26 0734) Weight:  [65.9 kg] 65.9 kg (06/26 0410)     Height: 4\' 11"  (149.9 cm) Weight: 65.9 kg BMI (Calculated): 29.33   Intake/Output this shift:   Intake/Output Summary (Last 24 hours) at 12/01/2018 0747 Last data filed at 12/01/2018 0600 Gross per 24 hour  Intake 1500 ml  Output 1480 ml  Net 20 ml    Constitutional :  alert, cooperative, appears stated age and no distress  Cardiovascular:  irregularly irregular rhythm  Gastrointestinal: soft, non-tender; bowel sounds normal; no masses,  no organomegaly. Non-distended  Skin: Cool and moist. pravena vac in place, JP with serosanguinous drainage.  Ostomy site clean  Psychiatric: Normal affect, non-agitated, not confused       LABS:  CMP Latest Ref Rng & Units 11/29/2018 11/28/2018 03/30/2018  Glucose 70 - 99 mg/dL 141(H) 111(H) 135(H)  BUN 8 - 23 mg/dL 13 - 13  Creatinine 0.44 - 1.00 mg/dL 0.97 1.06(H) 1.28(H)  Sodium 135 - 145 mmol/L 132(L) 130(L) 133(L)  Potassium 3.5 - 5.1 mmol/L 3.6 4.7 3.5  Chloride 98 - 111 mmol/L 95(L) - 97(L)  CO2 22 - 32 mmol/L 28 -  26  Calcium 8.9 - 10.3 mg/dL 8.1(L) - 9.3  Total Protein 6.5 - 8.1 g/dL - - 7.6  Total Bilirubin 0.3 - 1.2 mg/dL - - 1.1  Alkaline Phos 38 - 126 U/L - - 64  AST 15 - 41 U/L - - 27  ALT 0 - 44 U/L - - 26   CBC Latest Ref Rng & Units 11/29/2018 11/28/2018 11/28/2018  WBC 4.0 - 10.5 K/uL 13.8(H) 17.1(H) -  Hemoglobin 12.0 - 15.0 g/dL 10.4(L) 11.6(L) 11.6(L)  Hematocrit 36.0 - 46.0 % 31.6(L) 35.3(L) 34.0(L)  Platelets 150 - 400 K/uL 231 203 -    RADS: n/a Assessment:   S/p colostomy reversal.  Packing removed from ostomy site today, and covered with 4x4.  Advance to soft since no complaints and abdomen exam remains ok.

## 2018-12-01 NOTE — Progress Notes (Signed)
Pt states she is passing gas.

## 2018-12-01 NOTE — Consult Note (Signed)
[place] for Enoxaparin Indication: atrial fibrillation  Allergies  Allergen Reactions  . Poison Oak Extract Other (See Comments)    blistering  . Ampicillin Rash  . Penicillin V Potassium Rash    Has patient had a PCN reaction causing immediate rash, facial/tongue/throat swelling, SOB or lightheadedness with hypotension: No Has patient had a PCN reaction causing severe rash involving mucus membranes or skin necrosis: No Has patient had a PCN reaction that required hospitalization: No Has patient had a PCN reaction occurring within the last 10 years: No If all of the above answers are "NO", then may proceed with Cephalosporin use.     Patient Measurements: Height: 4\' 11"  (149.9 cm) Weight: 145 lb 4.8 oz (65.9 kg) IBW/kg (Calculated) : 43.2  Vital Signs: Temp: 97.9 F (36.6 C) (06/26 0734) Temp Source: Oral (06/26 0734) BP: 144/83 (06/26 0734) Pulse Rate: 50 (06/26 0734)  Labs: Recent Labs    11/28/18 1703 11/29/18 0346 12/01/18 0759  HGB 11.6* 10.4* 11.2*  HCT 35.3* 31.6* 34.3*  PLT 203 231 238  CREATININE 1.06* 0.97 0.93    Estimated Creatinine Clearance: 39.8 mL/min (by C-G formula based on SCr of 0.93 mg/dL).   Medical History: Past Medical History:  Diagnosis Date  . Asthma   . Atrial fibrillation ([place])   . CHF (congestive heart failure) ([place])   . Dysrhythmia   . GERD (gastroesophageal reflux disease)   . Hypertension   . Primary adenocarcinoma of right lung ([place]) 02/2005   Rad tx's.     Medications:  Medications Prior to Admission  Medication Sig Dispense Refill Last Dose  . albuterol (PROVENTIL) (2.5 MG/3ML) 0.083% nebulizer solution Take 2.5 mg by nebulization every 6 (six) hours as needed for wheezing or shortness of breath.   11/27/2018 at Unknown time  . albuterol (VENTOLIN HFA) 108 (90 Base) MCG/ACT inhaler Inhale 2 puffs into the lungs every 6 (six) hours as needed for wheezing or shortness of breath.    Past Week at Unknown time  . alendronate (FOSAMAX) 70 MG tablet Take 70 mg by mouth every Monday.    11/25/2018  . ALPRAZolam (XANAX) 0.5 MG tablet Take 0.5 mg by mouth at bedtime as needed for anxiety or sleep.    11/27/2018 at Unknown time  . apixaban (ELIQUIS) 5 MG TABS tablet Take 5 mg by mouth 2 (two) times daily.   11/24/2018  . Calcium Carbonate-Vitamin D (CALCIUM 600+D) 600-200 MG-UNIT TABS Take 1 tablet by mouth daily.   11/27/2018 at Unknown time  . digoxin (LANOXIN) 0.125 MG tablet Take 1 tablet (0.125 mg total) by mouth daily. 30 tablet 0 11/28/2018 at Unknown time  . diltiazem (DILACOR XR) 240 MG 24 hr capsule Take 240 mg by mouth daily.   11/28/2018 at Unknown time  . FLUoxetine (PROZAC) 20 MG capsule Take 20 mg by mouth daily.    11/27/2018 at Unknown time  . furosemide (LASIX) 20 MG tablet Take 20 mg by mouth daily.   11/27/2018 at Unknown time  . lovastatin (MEVACOR) 20 MG tablet Take 20 mg by mouth daily with supper.    11/27/2018 at Unknown time  . metoprolol succinate (TOPROL XL) 50 MG 24 hr tablet Take 1 tablet (50 mg total) by mouth daily. Take with or immediately following a meal. (Patient taking differently: Take 50 mg by mouth 2 (two) times a day. ) 30 tablet 11   . montelukast (SINGULAIR) 10 MG tablet Take 10 mg by mouth at bedtime.  11/27/2018 at Unknown time  . Multiple Vitamin (MULTI-VITAMINS) TABS Take 1 tablet by mouth daily.    11/27/2018 at Unknown time  . omeprazole (PRILOSEC) 20 MG capsule Take 20 mg by mouth daily.    11/28/2018 at Unknown time  . ondansetron (ZOFRAN) 4 MG tablet Take 1 tablet (4 mg total) by mouth daily as needed. (Patient taking differently: Take 4 mg by mouth daily as needed for nausea or vomiting. ) 10 tablet 0 Past Week at Unknown time  . potassium chloride (K-DUR) 10 MEQ tablet Take 10 mEq by mouth daily.   11/27/2018 at Unknown time  . traZODone (DESYREL) 50 MG tablet Take 50 mg by mouth at bedtime.   11/27/2018 at Unknown time  . amiodarone (PACERONE)  200 MG tablet Take 1 tablet (200 mg total) by mouth 2 (two) times daily. (Patient not taking: Reported on 11/21/2018) 60 tablet 1 Not Taking at Unknown time   Scheduled:  . amiodarone  200 mg Oral BID  . calcium-vitamin D  1 tablet Oral Daily  . digoxin  0.125 mg Oral Daily  . diltiazem  240 mg Oral Daily  . enoxaparin (LOVENOX) injection  60 mg Subcutaneous Q12H  . FLUoxetine  20 mg Oral Daily  . furosemide  20 mg Oral Daily  . metoprolol succinate  50 mg Oral BID  . montelukast  10 mg Oral QHS  . multivitamin with minerals  1 tablet Oral Daily  . pantoprazole  40 mg Oral Daily  . potassium chloride  10 mEq Oral Daily  . pravastatin  20 mg Oral q1800  . traZODone  50 mg Oral QHS   Infusions:   PRN: acetaminophen, albuterol, ALPRAZolam, HYDROcodone-acetaminophen, metoprolol tartrate, morphine injection, ondansetron **OR** ondansetron (ZOFRAN) IV, traMADol Anti-infectives (From admission, onward)   Start     Dose/Rate Route Frequency Ordered Stop   11/28/18 0600  metroNIDAZOLE (FLAGYL) IVPB 500 mg     500 mg 100 mL/hr over 60 Minutes Intravenous On call to O.R. 11/27/18 2151 11/28/18 0808   11/28/18 0600  ciprofloxacin (CIPRO) IVPB 400 mg     400 mg 200 mL/hr over 60 Minutes Intravenous On call to O.R. 11/27/18 2151 11/28/18 0753      Assessment: Pt takes apixaban for afib PTA. POD#3 colostomy reversal. Switched DVT ppx enoxaparin to therapeutic enoxaparin to anticoagulant her. Not using apixaban just in case more intervention needs to be done.   Goal of Therapy:  Monitor platelets by anticoagulation protocol: Yes   Plan:  Lovenox 60 mg q12H. Will order CBC q 3 days. CBC currently stable.   Oswald Hillock, PharmD, BCPS 12/01/2018,12:54 PM

## 2018-12-02 ENCOUNTER — Encounter: Payer: Self-pay | Admitting: *Deleted

## 2018-12-02 LAB — BASIC METABOLIC PANEL
Anion gap: 9 (ref 5–15)
BUN: 14 mg/dL (ref 8–23)
CO2: 29 mmol/L (ref 22–32)
Calcium: 8.7 mg/dL — ABNORMAL LOW (ref 8.9–10.3)
Chloride: 98 mmol/L (ref 98–111)
Creatinine, Ser: 0.96 mg/dL (ref 0.44–1.00)
GFR calc Af Amer: >60 mL/min (ref >60)
GFR calc non Af Amer: 56 mL/min — ABNORMAL LOW (ref >60)
Glucose, Bld: 103 mg/dL — ABNORMAL HIGH (ref 70–99)
Potassium: 3.9 mmol/L (ref 3.5–5.1)
Sodium: 136 mmol/L (ref 135–145)

## 2018-12-02 LAB — CBC WITH DIFFERENTIAL/PLATELET
Abs Immature Granulocytes: 0.04 10*3/uL (ref 0.00–0.07)
Basophils Absolute: 0 10*3/uL (ref 0.0–0.1)
Basophils Relative: 0 %
Eosinophils Absolute: 0.1 10*3/uL (ref 0.0–0.5)
Eosinophils Relative: 1 %
HCT: 31.5 % — ABNORMAL LOW (ref 36.0–46.0)
Hemoglobin: 10.4 g/dL — ABNORMAL LOW (ref 12.0–15.0)
Immature Granulocytes: 1 %
Lymphocytes Relative: 9 %
Lymphs Abs: 0.6 10*3/uL — ABNORMAL LOW (ref 0.7–4.0)
MCH: 28.3 pg (ref 26.0–34.0)
MCHC: 33 g/dL (ref 30.0–36.0)
MCV: 85.8 fL (ref 80.0–100.0)
Monocytes Absolute: 0.5 10*3/uL (ref 0.1–1.0)
Monocytes Relative: 7 %
Neutro Abs: 5.8 10*3/uL (ref 1.7–7.7)
Neutrophils Relative %: 82 %
Platelets: 235 10*3/uL (ref 150–400)
RBC: 3.67 MIL/uL — ABNORMAL LOW (ref 3.87–5.11)
RDW: 14.3 % (ref 11.5–15.5)
WBC: 7 10*3/uL (ref 4.0–10.5)
nRBC: 0 % (ref 0.0–0.2)

## 2018-12-02 LAB — MAGNESIUM: Magnesium: 2.1 mg/dL (ref 1.7–2.4)

## 2018-12-02 LAB — PHOSPHORUS: Phosphorus: 2.9 mg/dL (ref 2.5–4.6)

## 2018-12-02 MED ORDER — ENOXAPARIN SODIUM 80 MG/0.8ML ~~LOC~~ SOLN
70.0000 mg | Freq: Two times a day (BID) | SUBCUTANEOUS | Status: DC
  Administered 2018-12-02 – 2018-12-03 (×3): 70 mg via SUBCUTANEOUS
  Filled 2018-12-02 (×2): qty 0.8

## 2018-12-02 NOTE — Consult Note (Signed)
[place] for Enoxaparin Indication: atrial fibrillation  Allergies  Allergen Reactions  . Poison Oak Extract Other (See Comments)    blistering  . Ampicillin Rash  . Penicillin V Potassium Rash    Has patient had a PCN reaction causing immediate rash, facial/tongue/throat swelling, SOB or lightheadedness with hypotension: No Has patient had a PCN reaction causing severe rash involving mucus membranes or skin necrosis: No Has patient had a PCN reaction that required hospitalization: No Has patient had a PCN reaction occurring within the last 10 years: No If all of the above answers are "NO", then may proceed with Cephalosporin use.     Patient Measurements: Height: 4\' 11"  (149.9 cm) Weight: 152 lb 8.9 oz (69.2 kg) IBW/kg (Calculated) : 43.2  Vital Signs: Temp: 98.2 F (36.8 C) (06/27 0733) Temp Source: Oral (06/27 0733) BP: 108/80 (06/27 0733) Pulse Rate: 110 (06/27 0733)  Labs: Recent Labs    12/01/18 0759 12/02/18 0555  HGB 11.2* 10.4*  HCT 34.3* 31.5*  PLT 238 235  CREATININE 0.93 0.96    Estimated Creatinine Clearance: 39.5 mL/min (by C-G formula based on SCr of 0.96 mg/dL).   Medical History: Past Medical History:  Diagnosis Date  . Asthma   . Atrial fibrillation ([place])   . CHF (congestive heart failure) ([place])   . Dysrhythmia   . GERD (gastroesophageal reflux disease)   . Hypertension   . Primary adenocarcinoma of right lung ([place]) 02/2005   Rad tx's.     Medications:  Medications Prior to Admission  Medication Sig Dispense Refill Last Dose  . albuterol (PROVENTIL) (2.5 MG/3ML) 0.083% nebulizer solution Take 2.5 mg by nebulization every 6 (six) hours as needed for wheezing or shortness of breath.   11/27/2018 at Unknown time  . albuterol (VENTOLIN HFA) 108 (90 Base) MCG/ACT inhaler Inhale 2 puffs into the lungs every 6 (six) hours as needed for wheezing or shortness of breath.   Past Week at Unknown time  .  alendronate (FOSAMAX) 70 MG tablet Take 70 mg by mouth every Monday.    11/25/2018  . ALPRAZolam (XANAX) 0.5 MG tablet Take 0.5 mg by mouth at bedtime as needed for anxiety or sleep.    11/27/2018 at Unknown time  . apixaban (ELIQUIS) 5 MG TABS tablet Take 5 mg by mouth 2 (two) times daily.   11/24/2018  . Calcium Carbonate-Vitamin D (CALCIUM 600+D) 600-200 MG-UNIT TABS Take 1 tablet by mouth daily.   11/27/2018 at Unknown time  . digoxin (LANOXIN) 0.125 MG tablet Take 1 tablet (0.125 mg total) by mouth daily. 30 tablet 0 11/28/2018 at Unknown time  . diltiazem (DILACOR XR) 240 MG 24 hr capsule Take 240 mg by mouth daily.   11/28/2018 at Unknown time  . FLUoxetine (PROZAC) 20 MG capsule Take 20 mg by mouth daily.    11/27/2018 at Unknown time  . furosemide (LASIX) 20 MG tablet Take 20 mg by mouth daily.   11/27/2018 at Unknown time  . lovastatin (MEVACOR) 20 MG tablet Take 20 mg by mouth daily with supper.    11/27/2018 at Unknown time  . metoprolol succinate (TOPROL XL) 50 MG 24 hr tablet Take 1 tablet (50 mg total) by mouth daily. Take with or immediately following a meal. (Patient taking differently: Take 50 mg by mouth 2 (two) times a day. ) 30 tablet 11   . montelukast (SINGULAIR) 10 MG tablet Take 10 mg by mouth at bedtime.    11/27/2018 at Unknown  time  . Multiple Vitamin (MULTI-VITAMINS) TABS Take 1 tablet by mouth daily.    11/27/2018 at Unknown time  . omeprazole (PRILOSEC) 20 MG capsule Take 20 mg by mouth daily.    11/28/2018 at Unknown time  . ondansetron (ZOFRAN) 4 MG tablet Take 1 tablet (4 mg total) by mouth daily as needed. (Patient taking differently: Take 4 mg by mouth daily as needed for nausea or vomiting. ) 10 tablet 0 Past Week at Unknown time  . potassium chloride (K-DUR) 10 MEQ tablet Take 10 mEq by mouth daily.   11/27/2018 at Unknown time  . traZODone (DESYREL) 50 MG tablet Take 50 mg by mouth at bedtime.   11/27/2018 at Unknown time  . amiodarone (PACERONE) 200 MG tablet Take 1 tablet  (200 mg total) by mouth 2 (two) times daily. (Patient not taking: Reported on 11/21/2018) 60 tablet 1 Not Taking at Unknown time   Scheduled:  . amiodarone  200 mg Oral BID  . calcium-vitamin D  1 tablet Oral Daily  . digoxin  0.125 mg Oral Daily  . diltiazem  240 mg Oral Daily  . enoxaparin (LOVENOX) injection  70 mg Subcutaneous Q12H  . FLUoxetine  20 mg Oral Daily  . furosemide  20 mg Oral Daily  . metoprolol succinate  50 mg Oral BID  . montelukast  10 mg Oral QHS  . multivitamin with minerals  1 tablet Oral Daily  . pantoprazole  40 mg Oral Daily  . potassium chloride  10 mEq Oral Daily  . pravastatin  20 mg Oral q1800  . traZODone  50 mg Oral QHS   Infusions:   PRN: acetaminophen, albuterol, ALPRAZolam, HYDROcodone-acetaminophen, metoprolol tartrate, morphine injection, ondansetron **OR** ondansetron (ZOFRAN) IV, traMADol Anti-infectives (From admission, onward)   Start     Dose/Rate Route Frequency Ordered Stop   11/28/18 0600  metroNIDAZOLE (FLAGYL) IVPB 500 mg     500 mg 100 mL/hr over 60 Minutes Intravenous On call to O.R. 11/27/18 2151 11/28/18 0808   11/28/18 0600  ciprofloxacin (CIPRO) IVPB 400 mg     400 mg 200 mL/hr over 60 Minutes Intravenous On call to O.R. 11/27/18 2151 11/28/18 0753      Assessment: Pt takes apixaban for afib PTA. POD#4 colostomy reversal. Switched DVT ppx enoxaparin to therapeutic enoxaparin to anticoagulate her. Not using apixaban just in case more intervention needs to be done.   Goal of Therapy:  Monitor platelets by anticoagulation protocol: Yes   Plan:  Lovenox 70 mg q12H. Will order CBC q 3 days. CBC currently stable.   Sundeep Destin K, RPH 12/02/2018,9:14 AM

## 2018-12-02 NOTE — Progress Notes (Signed)
12/02/2018  Subjective: Patient is 4 Days Post-Op s/p colostomy reversal.  No acute events overnight.  Patient reports appropriate soreness, well controlled with medication.  Tolerated clear liquids without any nausea or distention, and has been having flatus.    Vital signs: Temp:  [98 F (36.7 C)-99.1 F (37.3 C)] 98.2 F (36.8 C) (06/27 1524) Pulse Rate:  [79-110] 79 (06/27 1524) Resp:  [20] 20 (06/27 0346) BP: (108-133)/(74-88) 120/74 (06/27 1524) SpO2:  [94 %-96 %] 96 % (06/27 1524) Weight:  [69.2 kg] 69.2 kg (06/27 0347)   Intake/Output: 06/26 0701 - 06/27 0700 In: -  Out: 543 [Urine:200; Drains:343] Last BM Date: 11/29/18  Physical Exam: Constitutional: No acute distress Abdomen:  Soft, non-distended, appropriately tender to palpation.  Incision covered with praveena vac, without any leak issues.  Prior ostomy site clean, dry, with staples in place.  JP drain with serosanguinous fluid, but high output recorded.  Labs:  Recent Labs    12/01/18 0759 12/02/18 0555  WBC 11.0* 7.0  HGB 11.2* 10.4*  HCT 34.3* 31.5*  PLT 238 235   Recent Labs    12/01/18 0759 12/02/18 0555  NA 136 136  K 4.5 3.9  CL 95* 98  CO2 31 29  GLUCOSE 135* 103*  BUN 11 14  CREATININE 0.93 0.96  CALCIUM 9.2 8.7*   No results for input(s): LABPROT, INR in the last 72 hours.  Imaging: No results found.  Assessment/Plan: This is a 78 y.o. female s/p colostomy reversal.  --WBC normalized today.  No antibiotics needed. --Will advance diet to soft today.  Pending BM thus far, but having good flatus. --JP drain with high output but serosanguinous.  ?typographical error?  Will continue to monitor. --Could possibly d/c home tomorrow.  Per Dr. Lysle Pearl, would require therapeutic lovenox while JP drain in place, and would transition back to Eliquis after JP drain removal.   Melvyn Neth, MD [place]

## 2018-12-02 NOTE — Progress Notes (Signed)
Pt up to bsc. Voiding and passed small,soft- formed bm. Ambulated with walker 120 feet in hallway. tol well. painfree the entire day. Vss. Tele remains a-fib in 90's. jp drain yielded 25 ml's sero sang.

## 2018-12-02 NOTE — Plan of Care (Signed)
  Problem: Clinical Measurements: Goal: Respiratory complications will improve Outcome: Progressing Note: On room air   Problem: Activity: Goal: Risk for activity intolerance will decrease Outcome: Progressing Note: Up to [facility] with 1 assist   Problem: Nutrition: Goal: Adequate nutrition will be maintained Outcome: Progressing   Problem: Coping: Goal: Level of anxiety will decrease Outcome: Progressing   Problem: Pain Managment: Goal: General experience of comfort will improve Outcome: Progressing Note: No complaints of pain this shift   Problem: Safety: Goal: Ability to remain free from injury will improve Outcome: Progressing   Problem: Skin Integrity: Goal: Risk for impaired skin integrity will decrease Outcome: Progressing   Problem: Education: Goal: Knowledge of General Education information will improve Description: Including pain rating scale, medication(s)/side effects and non-pharmacologic comfort measures Outcome: Completed/Met

## 2018-12-03 LAB — PHOSPHORUS: Phosphorus: 2.7 mg/dL (ref 2.5–4.6)

## 2018-12-03 LAB — CBC WITH DIFFERENTIAL/PLATELET
Abs Immature Granulocytes: 0.04 10*3/uL (ref 0.00–0.07)
Basophils Absolute: 0 10*3/uL (ref 0.0–0.1)
Basophils Relative: 0 %
Eosinophils Absolute: 0.1 10*3/uL (ref 0.0–0.5)
Eosinophils Relative: 1 %
HCT: 32 % — ABNORMAL LOW (ref 36.0–46.0)
Hemoglobin: 10.6 g/dL — ABNORMAL LOW (ref 12.0–15.0)
Immature Granulocytes: 1 %
Lymphocytes Relative: 10 %
Lymphs Abs: 0.7 10*3/uL (ref 0.7–4.0)
MCH: 28.3 pg (ref 26.0–34.0)
MCHC: 33.1 g/dL (ref 30.0–36.0)
MCV: 85.3 fL (ref 80.0–100.0)
Monocytes Absolute: 0.6 10*3/uL (ref 0.1–1.0)
Monocytes Relative: 8 %
Neutro Abs: 5.6 10*3/uL (ref 1.7–7.7)
Neutrophils Relative %: 80 %
Platelets: 248 10*3/uL (ref 150–400)
RBC: 3.75 MIL/uL — ABNORMAL LOW (ref 3.87–5.11)
RDW: 14.1 % (ref 11.5–15.5)
WBC: 7.1 10*3/uL (ref 4.0–10.5)
nRBC: 0 % (ref 0.0–0.2)

## 2018-12-03 LAB — BASIC METABOLIC PANEL
Anion gap: 8 (ref 5–15)
BUN: 16 mg/dL (ref 8–23)
CO2: 31 mmol/L (ref 22–32)
Calcium: 8.7 mg/dL — ABNORMAL LOW (ref 8.9–10.3)
Chloride: 97 mmol/L — ABNORMAL LOW (ref 98–111)
Creatinine, Ser: 0.93 mg/dL (ref 0.44–1.00)
GFR calc Af Amer: >60 mL/min (ref >60)
GFR calc non Af Amer: 58 mL/min — ABNORMAL LOW (ref >60)
Glucose, Bld: 105 mg/dL — ABNORMAL HIGH (ref 70–99)
Potassium: 4 mmol/L (ref 3.5–5.1)
Sodium: 136 mmol/L (ref 135–145)

## 2018-12-03 LAB — MAGNESIUM: Magnesium: 2 mg/dL (ref 1.7–2.4)

## 2018-12-03 MED ORDER — ENOXAPARIN SODIUM 80 MG/0.8ML ~~LOC~~ SOLN: 65.0000 mg | Freq: Two times a day (BID) | SUBCUTANEOUS | Status: DC

## 2018-12-03 MED ORDER — ACETAMINOPHEN 325 MG PO TABS: 650.0000 mg | ORAL_TABLET | Freq: Four times a day (QID) | ORAL | Status: AC | PRN

## 2018-12-03 MED ORDER — HYDROCODONE-ACETAMINOPHEN 5-325 MG PO TABS: 1.0000 | ORAL_TABLET | Freq: Four times a day (QID) | ORAL | 0 refills | Status: DC | PRN

## 2018-12-03 NOTE — Progress Notes (Signed)
Pt to be discharged today. Dressing and drain and wound vac all removed by dr. Hampton Abbot. Iv and tele removed. disch instructions given to pt. Family to transport.

## 2018-12-03 NOTE — Discharge Summary (Signed)
Patient ID: Jody [name] MRN: [id number] DOB/AGE: [birth year] 78 y.o.  Admit date: 11/28/2018 Discharge date: 12/03/2018   Discharge Diagnoses:  Active Problems:   Chronic a-fib   History of colostomy reversal   Procedures:  Colostomy reversal  Hospital Course: Patient was admitted on 6/23 after undergoing a colostomy reversal in the OR with Dr. Lysle [name].  The patient tolerated the procedure well but developed rapid afib post-op.  Cardiology was consulted and recommended rate control with her home medications.  She had an echocardiogram but there is no read at this point.  Post-operatively, her pain was well controlled, and her diet was slowly advanced as she regained bowel function.  Due to her history of atrial fibrillation, she was on therapeutic Lovenox as a precaution.  She initially had a JP drain in the RLQ as well as a Praveena wound vac, which were both removed on 6/28 prior to discharge.  She worked with physical therapy who recommended home health PT.  Services for both PT and RN have been arranged at home for her care.  On exam, she is in no acute distress, with stable vital signs.  Her abdomen is soft, non-distended, appropriately sore to palpation.  JP drain with serosanguinous fluid was removed at bedside without complications.  Midline incision covered with Praveena vac which was also removed.  Her incision is clean, dry, with staples.  Colostomy site healing well, with staples, clean, dry.  She will follow up with Dr. Lysle [name] on 7/6.  Consults: Cardiology, PT  Disposition: Discharge disposition: 01-Home or Self Care       Discharge Instructions    Call MD for:  difficulty breathing, headache or visual disturbances   Complete by: As directed    Call MD for:  persistant nausea and vomiting   Complete by: As directed    Call MD for:  redness, tenderness, or signs of infection (pain, swelling, redness, odor or green/yellow discharge around incision site)   Complete by: As  directed    Call MD for:  severe uncontrolled pain   Complete by: As directed    Call MD for:  temperature >100.4   Complete by: As directed    Change dressing (specify)   Complete by: As directed    Right lower quadrant wound:  Change dressing with dry gauze once daily and as needed to keep the area clean and dry.  Secure with tape.  Continue dressing changes until wound fully sealed.  Midline incision:  Do not remove staples.  Can leave wound open to air, but if preferred, can cover with dry gauze dressing to be changed once daily.  Secure with tape.  Left incision:  Do not remove staples:  Can leave wound open to air, but if preferred, can cover with dry gauze dressing to be changed once daily.  Secure with tape.   Diet - low sodium heart healthy   Complete by: As directed    Discharge instructions   Complete by: As directed    1.  Patient may shower, but do not scrub wounds heavily and dab dry only. 2.  Do not submerge wounds in pool/tub for 1 week. 3.  Do not remove staples 4.  May resume your Eliquis tonight 12/03/18.   Driving Restrictions   Complete by: As directed    Do not drive while taking narcotics for pain control.   Increase activity slowly   Complete by: As directed    Lifting restrictions   Complete by: As directed  No heavy lifting or pushing of more than 10-15 lbs for 4 weeks.     Allergies as of 12/03/2018      Reactions   Poison Oak Extract Other (See Comments)   blistering   Ampicillin Rash   Penicillin V Potassium Rash   Has patient had a PCN reaction causing immediate rash, facial/tongue/throat swelling, SOB or lightheadedness with hypotension: No Has patient had a PCN reaction causing severe rash involving mucus membranes or skin necrosis: No Has patient had a PCN reaction that required hospitalization: No Has patient had a PCN reaction occurring within the last 10 years: No If all of the above answers are "NO", then may proceed with Cephalosporin  use.      Medication List    TAKE these medications   acetaminophen 325 MG tablet Commonly known as: TYLENOL Take 2 tablets (650 mg total) by mouth every 6 (six) hours as needed (fever > 101).   albuterol 108 (90 Base) MCG/ACT inhaler Commonly known as: VENTOLIN HFA Inhale 2 puffs into the lungs every 6 (six) hours as needed for wheezing or shortness of breath.   albuterol (2.5 MG/3ML) 0.083% nebulizer solution Commonly known as: PROVENTIL Take 2.5 mg by nebulization every 6 (six) hours as needed for wheezing or shortness of breath.   alendronate 70 MG tablet Commonly known as: FOSAMAX Take 70 mg by mouth every Monday.   ALPRAZolam 0.5 MG tablet Commonly known as: XANAX Take 0.5 mg by mouth at bedtime as needed for anxiety or sleep.   amiodarone 200 MG tablet Commonly known as: Pacerone Take 1 tablet (200 mg total) by mouth 2 (two) times daily.   Calcium 600+D 600-200 MG-UNIT Tabs Generic drug: Calcium Carbonate-Vitamin D Take 1 tablet by mouth daily.   digoxin 0.125 MG tablet Commonly known as: LANOXIN Take 1 tablet (0.125 mg total) by mouth daily.   diltiazem 240 MG 24 hr capsule Commonly known as: DILACOR XR Take 240 mg by mouth daily.   Eliquis 5 MG Tabs tablet Generic drug: apixaban Take 5 mg by mouth 2 (two) times daily.   FLUoxetine 20 MG capsule Commonly known as: PROZAC Take 20 mg by mouth daily.   furosemide 20 MG tablet Commonly known as: LASIX Take 20 mg by mouth daily.   HYDROcodone-acetaminophen 5-325 MG tablet Commonly known as: NORCO/VICODIN Take 1 tablet by mouth every 6 (six) hours as needed for moderate pain.   lovastatin 20 MG tablet Commonly known as: MEVACOR Take 20 mg by mouth daily with supper.   metoprolol succinate 50 MG 24 hr tablet Commonly known as: Toprol XL Take 1 tablet (50 mg total) by mouth daily. Take with or immediately following a meal. What changed:   when to take this  additional instructions   montelukast  10 MG tablet Commonly known as: SINGULAIR Take 10 mg by mouth at bedtime.   Multi-Vitamins Tabs Take 1 tablet by mouth daily.   omeprazole 20 MG capsule Commonly known as: PRILOSEC Take 20 mg by mouth daily.   ondansetron 4 MG tablet Commonly known as: Zofran Take 1 tablet (4 mg total) by mouth daily as needed. What changed: reasons to take this   potassium chloride 10 MEQ tablet Commonly known as: K-DUR Take 10 mEq by mouth daily.   traZODone 50 MG tablet Commonly known as: DESYREL Take 50 mg by mouth at bedtime.            Discharge Care Instructions  (From admission, onward)  Start     Ordered   12/03/18 0000  Change dressing (specify)    Comments: Right lower quadrant wound:  Change dressing with dry gauze once daily and as needed to keep the area clean and dry.  Secure with tape.  Continue dressing changes until wound fully sealed.  Midline incision:  Do not remove staples.  Can leave wound open to air, but if preferred, can cover with dry gauze dressing to be changed once daily.  Secure with tape.  Left incision:  Do not remove staples:  Can leave wound open to air, but if preferred, can cover with dry gauze dressing to be changed once daily.  Secure with tape.   12/03/18 1405         Follow-up Information    Benjamine Sprague, DO Follow up on 12/11/2018.   Specialty: Surgery Why: Patient has appointment with Dr. Lysle [name] on 12/11/18 Contact information: [address] [place] [address] [phone]

## 2018-12-03 NOTE — Plan of Care (Signed)
  Problem: Activity: Goal: Risk for activity intolerance will decrease Outcome: Progressing Note: Up to [facility] with 1 assist, tolerating well   Problem: Nutrition: Goal: Adequate nutrition will be maintained Outcome: Progressing Note: Up to a soft diet   Problem: Elimination: Goal: Will not experience complications related to bowel motility Outcome: Progressing Note: BM yesterday 12/02/2018 Goal: Will not experience complications related to urinary retention Outcome: Progressing   Problem: Pain Managment: Goal: General experience of comfort will improve Outcome: Progressing Note: No complaints of pain this shift   Problem: Safety: Goal: Ability to remain free from injury will improve Outcome: Progressing   Problem: Skin Integrity: Goal: Risk for impaired skin integrity will decrease Outcome: Progressing   Problem: Clinical Measurements: Goal: Respiratory complications will improve Outcome: Completed/Met Note: On room air   Problem: Coping: Goal: Level of anxiety will decrease Outcome: Completed/Met

## 2018-12-03 NOTE — TOC Initial Note (Signed)
Transition of Care [facility]) - Initial/Assessment Note    Patient Details  Name: Jody [name] MRN: [id number] Date of Birth: Jun 08, 1940  [facility] CM/SW Contact:    Latanya Maudlin, RN Phone Number: 12/03/2018, 11:22 AM  Clinical Narrative:  TOC consulted to assess for home health needs. Patient currently from home. Has a rolling walker. PT recommending home health and patient feels it would be beneficial. CMS Medicare.gov Compare Post Acute Care list reviewed with patient ans she has used [facility] in the past, referral placed with [facility]. Patient also has [facility] for aide care 3x per week.                   Expected Discharge Plan: [place] Barriers to Discharge: Continued Medical Work up   Patient Goals and CMS Choice   CMS Medicare.gov Compare Post Acute Care list provided to:: Patient Choice offered to / list presented to : Patient  Expected Discharge Plan and Services Expected Discharge Plan: [place]   Discharge Planning Services: CM Consult Post Acute Care Choice: [place] arrangements for the past 2 months: Single Family Home                           HH Arranged: RN, PT, Nurse's Aide [place] Agency: [place] ([place]) Date HH Agency Contacted: 12/03/18 Time [place]: 1121 Representative spoke with at [place]: Corene Cornea  Prior Living Arrangements/Services Living arrangements for the past 2 months: [place] with:: Spouse Patient language and need for interpreter reviewed:: Yes Do you feel safe going back to the place where you live?: Yes          Current home services: Homehealth aide Criminal Activity/Legal Involvement Pertinent to Current Situation/Hospitalization: No - Comment as needed  Activities of Daily Living Home Assistive Devices/Equipment: Environmental consultant (specify type) ADL Screening (condition at time of admission) Patient's cognitive ability adequate to safely  complete daily activities?: No Is the patient deaf or have difficulty hearing?: No Does the patient have difficulty seeing, even when wearing glasses/contacts?: No Does the patient have difficulty concentrating, remembering, or making decisions?: Yes Patient able to express need for assistance with ADLs?: Yes Does the patient have difficulty dressing or bathing?: Yes Independently performs ADLs?: No Communication: Independent Dressing (OT): Needs assistance Is this a change from baseline?: Pre-admission baseline Grooming: Needs assistance Is this a change from baseline?: Pre-admission baseline Feeding: Independent Bathing: Needs assistance Is this a change from baseline?: Pre-admission baseline Toileting: Needs assistance Is this a change from baseline?: Pre-admission baseline In/Out Bed: Needs assistance Is this a change from baseline?: Change from baseline, expected to last >3 days Walks in Home: Needs assistance Is this a change from baseline?: Pre-admission baseline Does the patient have difficulty walking or climbing stairs?: Yes Weakness of Legs: Both Weakness of Arms/Hands: Both  Permission Sought/Granted Permission sought to share information with : Case Manager                Emotional Assessment Appearance:: Appears stated age Attitude/Demeanor/Rapport: Gracious Affect (typically observed): Pleasant Orientation: : Oriented to Self, Oriented to Place, Oriented to  Time, Oriented to Situation      Admission diagnosis:  S P colostomy creation Patient Active Problem List   Diagnosis Date Noted  . Chronic a-fib 11/28/2018  . History of colostomy reversal 11/28/2018  . Perforation bowel ([place])   . Poor appetite   .  Acute delirium   . Goals of care, counseling/discussion   . Palliative care encounter   . Diverticulitis of colon with perforation 12/27/2017  . Atrial fibrillation with RVR ([place]) 12/27/2017  . Malignant neoplasm of upper lobe of right lung ([place])  12/31/2015  . 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 TI (tricuspid incompetence) 01/21/2015  . Anxiety 01/13/2015  . Clinical depression 01/13/2015  . 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 Borderline diabetes mellitus 01/13/2015  . Benign essential HTN 01/06/2015  . Essential (primary) hypertension 01/06/2015  . Atrial fibrillation, chronic ([place]) 05/20/2014  . 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 Chronic atrial fibrillation 05/20/2014  . 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 Cough 11/20/2013  . Breath shortness 11/01/2013  . Breathlessness on exertion 10/30/2013   PCP:  Baxter Hire, MD Pharmacy:   [id number] - [address], [place] [address] Phone: [phone] Fax: [phone]     Social Determinants of Health (SDOH) Interventions    Readmission Risk Interventions Readmission Risk Prevention Plan 01/22/2018  Transportation Screening Complete  Medication Review ([place]) Complete  PCP or Specialist appointment within 3-5 days of discharge Complete  HRI or [place] Complete  SW Recovery Care/Counseling Consult Complete  Palliative Care Screening Complete  Medication Reconcilation (Pharmacy) Complete  [place] Patient refused  Some recent data might be hidden

## 2019-01-25 LAB — ECHOCARDIOGRAM COMPLETE
Height: 59 in
Weight: 2479.73 oz

## 2019-05-08 ENCOUNTER — Ambulatory Visit
Admission: RE | Admit: 2019-05-08 | Discharge: 2019-05-08 | Disposition: A | Discharge status: Home / Self Care | Payer: Medicare Other | Source: Ambulatory Visit | Attending: Pulmonary Disease | Admitting: Pulmonary Disease

## 2019-05-08 ENCOUNTER — Other Ambulatory Visit: Payer: Self-pay

## 2019-05-08 DIAGNOSIS — I2510 Atherosclerotic heart disease of native coronary artery without angina pectoris: Secondary | ICD-10-CM | POA: Insufficient documentation

## 2019-05-08 DIAGNOSIS — K449 Diaphragmatic hernia without obstruction or gangrene: Secondary | ICD-10-CM | POA: Diagnosis not present

## 2019-05-08 DIAGNOSIS — R918 Other nonspecific abnormal finding of lung field: Secondary | ICD-10-CM | POA: Insufficient documentation

## 2019-05-08 DIAGNOSIS — I700 Atherosclerosis of aorta: Secondary | ICD-10-CM | POA: Insufficient documentation

## 2019-05-08 DIAGNOSIS — J4540 Moderate persistent asthma, uncomplicated: Secondary | ICD-10-CM | POA: Insufficient documentation

## 2019-06-28 ENCOUNTER — Other Ambulatory Visit: Payer: Self-pay | Admitting: Acute Care

## 2019-06-28 DIAGNOSIS — R413 Other amnesia: Secondary | ICD-10-CM

## 2019-07-08 ENCOUNTER — Other Ambulatory Visit: Payer: Self-pay

## 2019-07-08 ENCOUNTER — Ambulatory Visit
Admission: RE | Admit: 2019-07-08 | Discharge: 2019-07-08 | Disposition: A | Discharge status: Home / Self Care | Payer: Medicare Other | Source: Ambulatory Visit | Attending: Acute Care | Admitting: Acute Care

## 2019-07-08 DIAGNOSIS — R413 Other amnesia: Secondary | ICD-10-CM

## 2019-08-18 IMAGING — RF SWALLOWING FUNCTION
1 series · 1 of 1 positions shown · non-contrast
Comparison: None.

CLINICAL DATA: Dysphagia. Cough.

EXAM:
MODIFIED BARIUM SWALLOW
TECHNIQUE: Different consistencies of barium were administered orally to the
patient by the Speech Pathologist. Imaging of the pharynx was
performed in the lateral projection. The radiologist was present in
the fluoroscopy room for this study, providing personal supervision.
FLUOROSCOPY TIME:  Fluoroscopy Time:  [DATE]
Number of Acquired Spot Images: 1

[Series 1: cp_standard · 0.17mm/px · 1 of 1 slices shown]
[im 1/1]
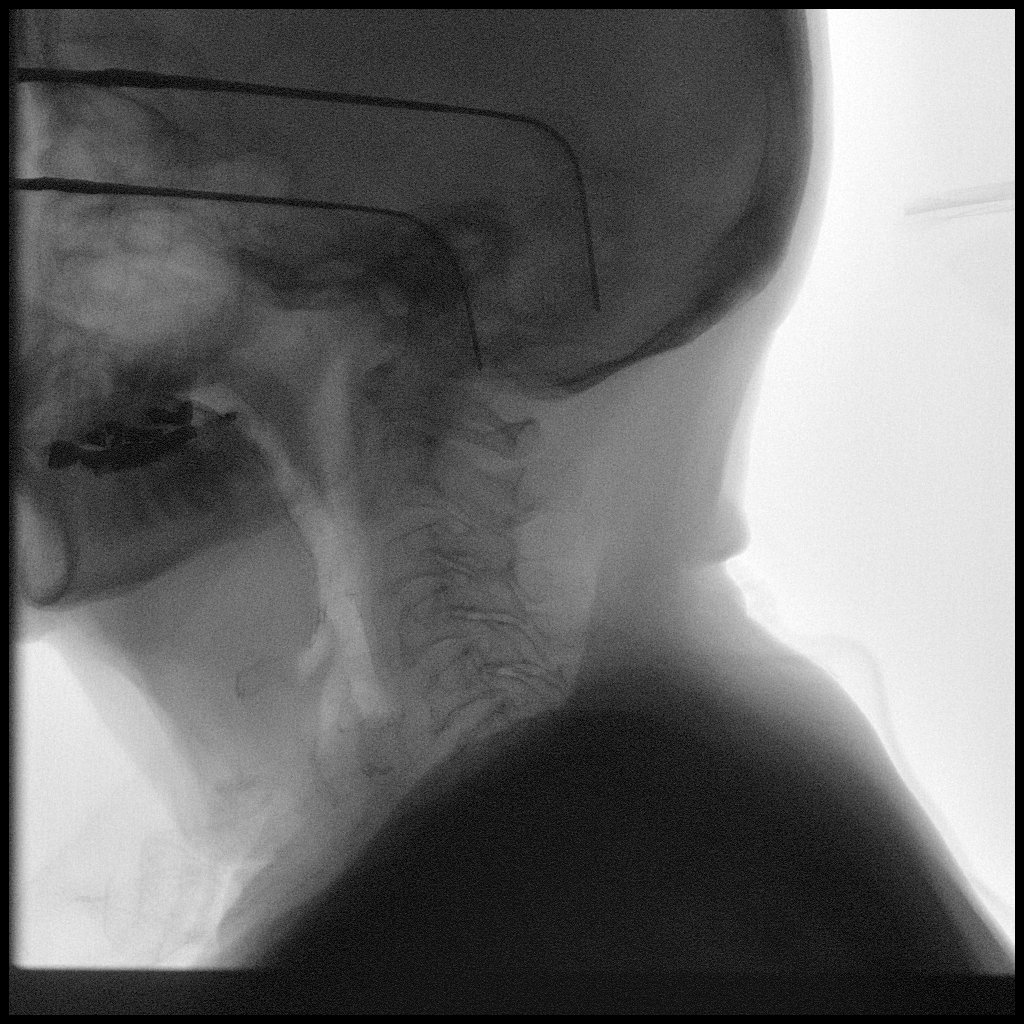

[1 of 1 positions shown; findings below may reference images not displayed]

FINDINGS: Fluoroscopic assistance was provided for modified barium swallow
examination performed by speech pathology.
IMPRESSION: Fluoroscopic assistance was provided for modified barium swallow
examination performed by speech pathology.

Please refer to the Speech Pathologists report for complete details
and recommendations.

## 2019-08-20 IMAGING — CT CT CHEST WITH CONTRAST
2 of 4 series · 15 of 36 positions shown, 18 images · IV contrast (omnipaque)
Comparison: 03/30/2017

CLINICAL DATA: Worsening dry cough and shortness of breath x 2-3
months. History of right upper lobe adenocarcinoma in 5996, status
post radiation.

EXAM:
CT CHEST WITH CONTRAST
TECHNIQUE: Multidetector CT imaging of the chest was performed during
intravenous contrast administration.
CONTRAST:  60mL OMNIPAQUE IOHEXOL 300 MG/ML  SOLN

[Series 2: axial chest · axial · 0.61mm/px · z∈[-1320,-1060]mm · 12 of 154 slices shown, 15 images]
[im 12/154  mediastinal]
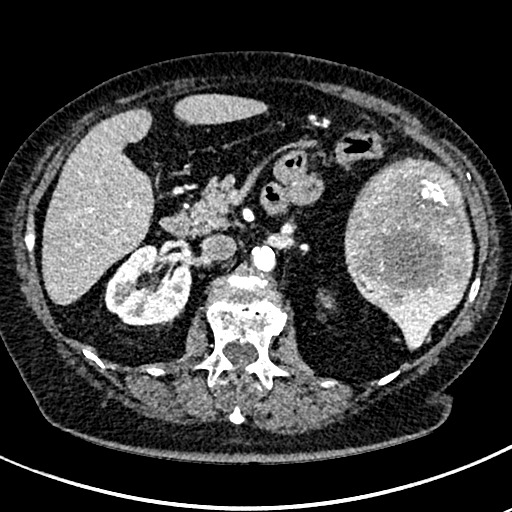
[im 12/154  lung]
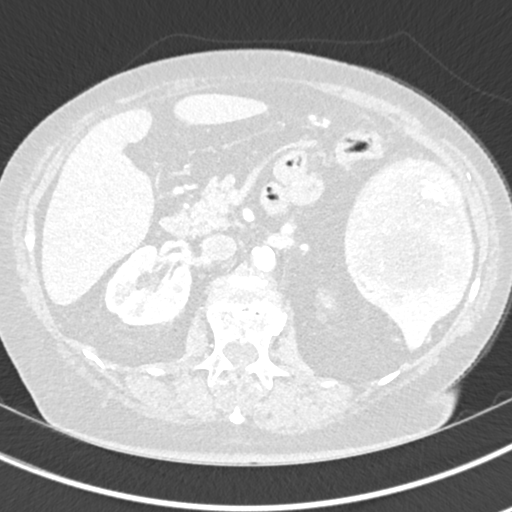
[im 24/154  lung]
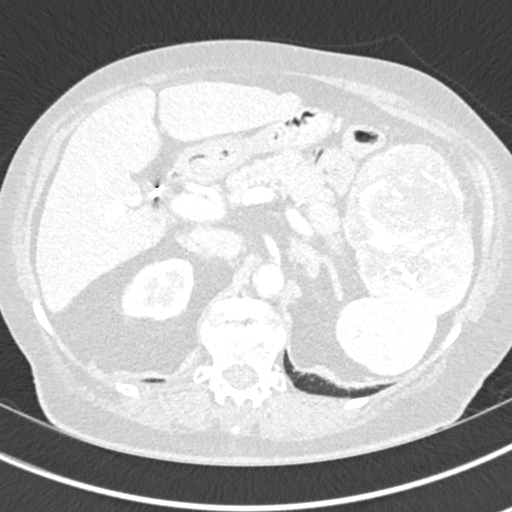
[im 36/154  lung]
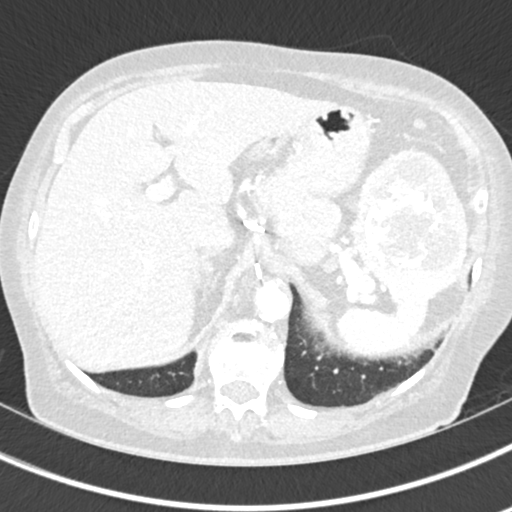
[im 48/154  lung]
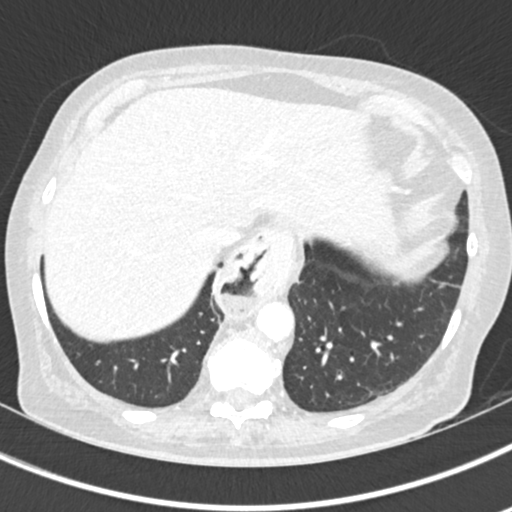
[im 59/154  mediastinal]
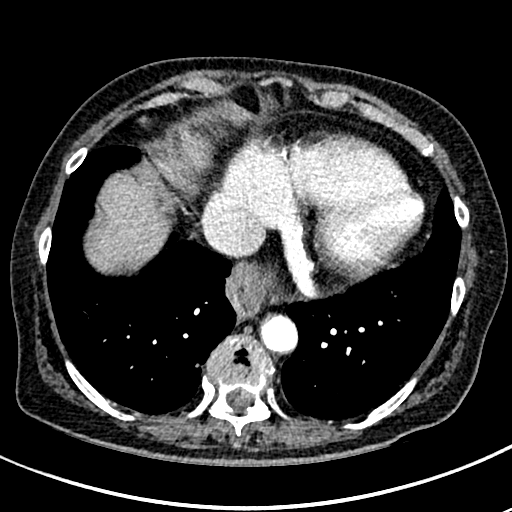
[im 59/154  lung]
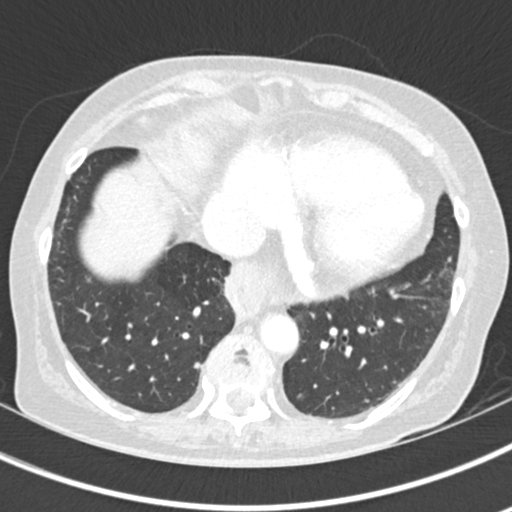
[im 71/154  lung]
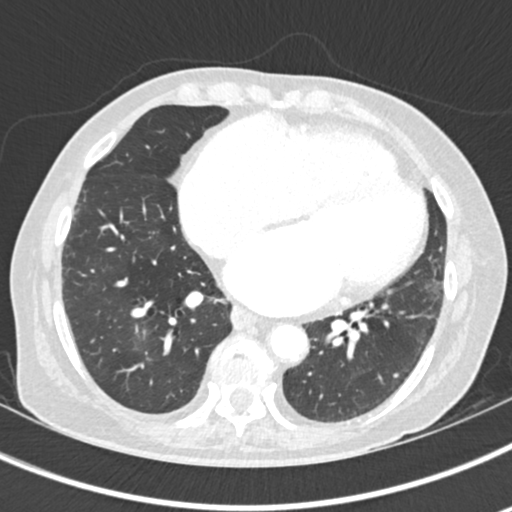
[im 83/154  lung]
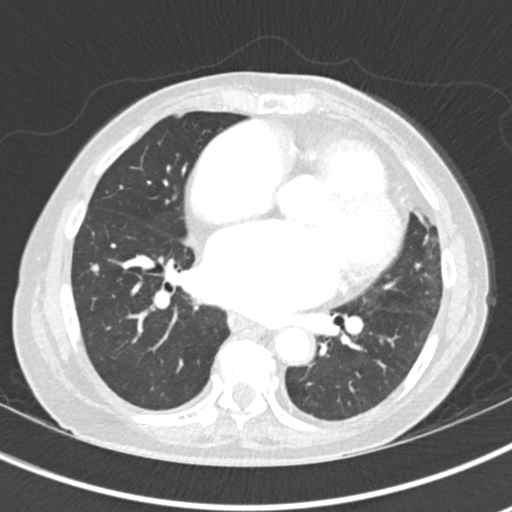
[im 95/154  lung]
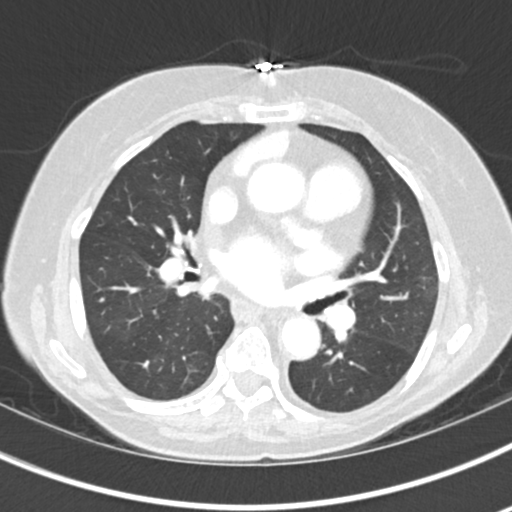
[im 106/154  mediastinal]
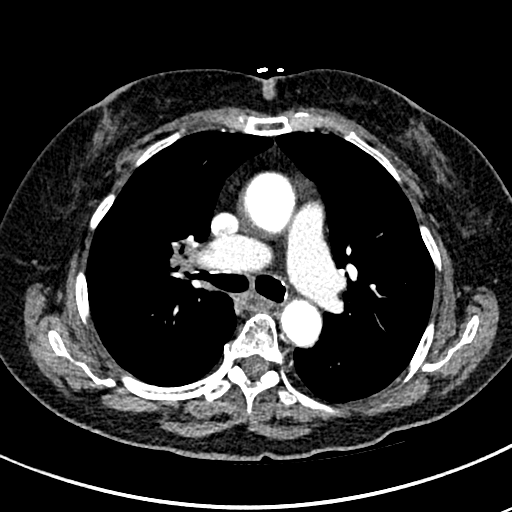
[im 106/154  lung]
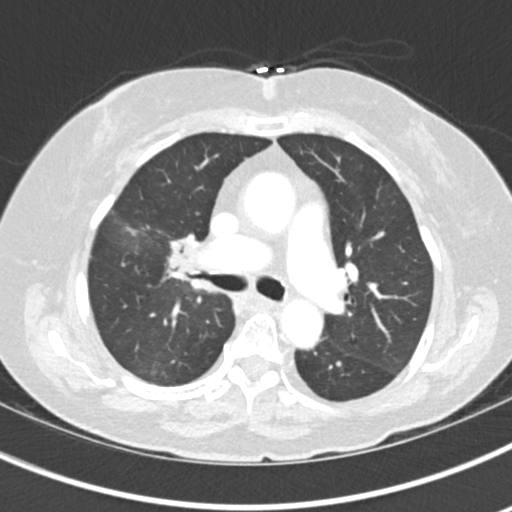
[im 118/154  lung]
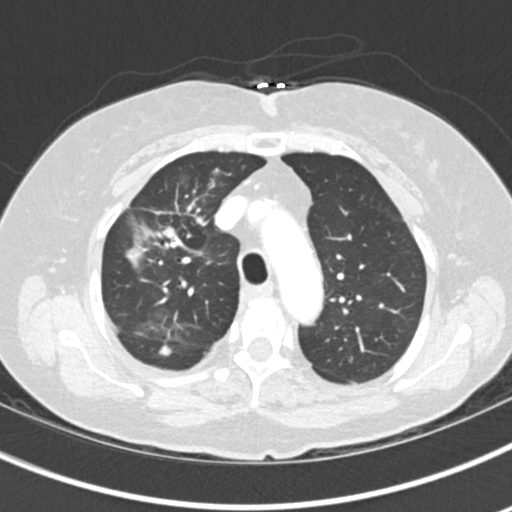
[im 130/154  lung]
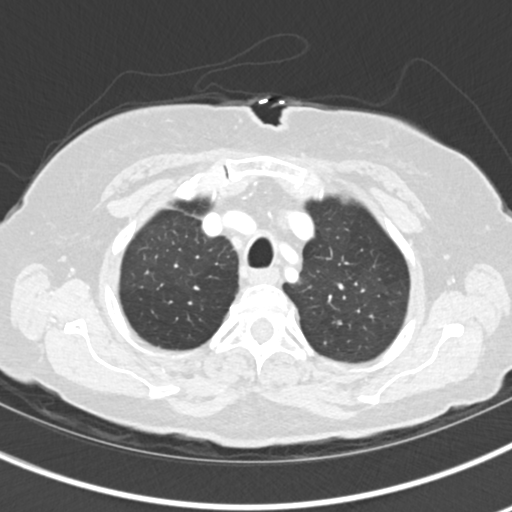
[im 142/154  lung]
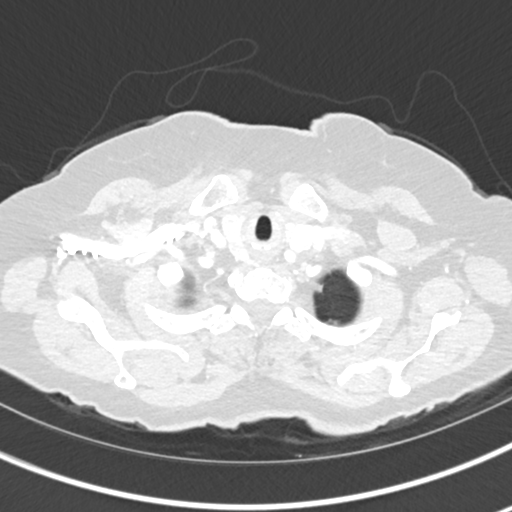

[Series 4: coronal chest · coronal · 0.60mm/px · 3 of 133 slices shown]
[im 27/133  lung]
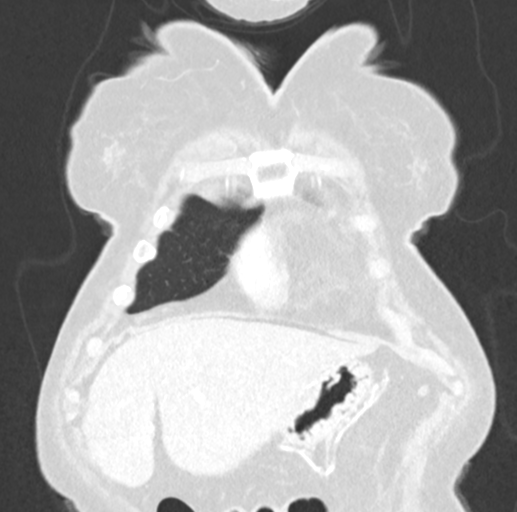
[im 53/133  lung]
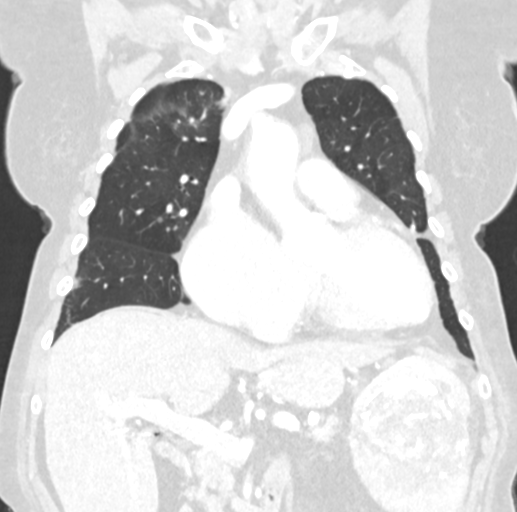
[im 80/133  lung]
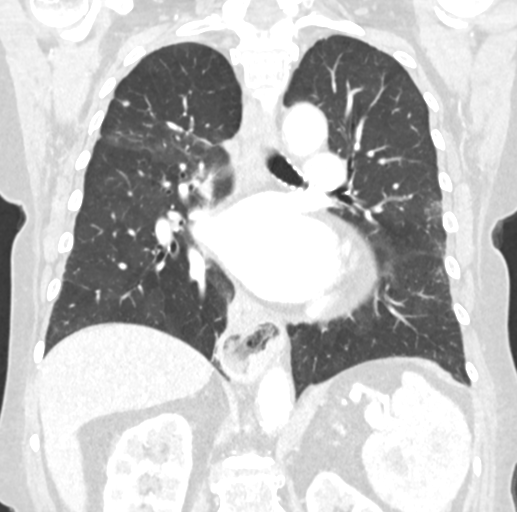

[15 of 36 positions shown; findings below may reference images not displayed]

FINDINGS: Cardiovascular: Cardiomegaly.  No pericardial effusion.

No evidence of thoracic aortic aneurysm. Atherosclerotic
calcifications of the aortic arch.

Coronary atherosclerosis of the LAD and right coronary artery.

Mediastinum/Nodes: No suspicious mediastinal lymphadenopathy.

Subcentimeter bilateral thyroid nodules, suggesting multinodular
goiter.

Lungs/Pleura: Radiation changes in the right upper lobe/suprahilar
region.

Numerous bilateral pulmonary nodules in the lungs bilaterally, some
of which are unchanged. For example:

--6 mm nodule in the medial left lung apex (series 3/image 18),
unchanged

--7 mm nodule in the superior segment right lower lobe (series
3/image 37), unchanged

--6 mm nodule in the left lower lobe (series 3/image 79), previously
6-7 mm

However, there are additional nodules/opacities which are
new/progressive. For example:

--2.3 cm subpleural ground-glass opacity in the lingula (series
3/image 64), new

--1.7 cm ground-glass opacity in the posterior right lower lobe
(series 3/image 82), new

--8 mm solid nodule in the posterior left lower lobe (series 3/image
112), new

No focal consolidation.

No pleural effusion or pneumothorax.

Upper Abdomen: Visualized upper abdomen is notable for multiple
calcified anterior splenic lesions, similar. Prior cholecystectomy.
Vascular calcifications. Stable mild thickening of the left adrenal
gland.

Musculoskeletal: Mild degenerative changes of the visualized
thoracolumbar spine.
IMPRESSION: Radiation changes in the right upper lobe/suprahilar region.

Numerous bilateral pulmonary nodules, many of which are unchanged
and may be benign. However, a dominant 8 mm left lower lobe nodule
is new, metastasis not excluded. Follow-up CT chest is suggested in
3-6 months.

Two ground-glass opacities in the lingula and posterior right lower
lobe are also new. Primary bronchogenic neoplasm could have this
appearance. Attention on follow-up is suggested to assess for
persistence.

Aortic Atherosclerosis ([id number]).

## 2019-10-01 ENCOUNTER — Other Ambulatory Visit
Admission: RE | Admit: 2019-10-01 | Discharge: 2019-10-01 | Disposition: A | Discharge status: Home / Self Care | Payer: Medicare Other | Source: Ambulatory Visit | Attending: Internal Medicine | Admitting: Internal Medicine

## 2019-10-01 DIAGNOSIS — I420 Dilated cardiomyopathy: Secondary | ICD-10-CM | POA: Diagnosis present

## 2019-10-01 LAB — DIGOXIN LEVEL: Digoxin Level: <0.2 ng/mL — ABNORMAL LOW (ref 0.8–2.0)

## 2020-01-30 ENCOUNTER — Encounter (HOSPITAL_COMMUNITY): Payer: Self-pay | Admitting: Anesthesiology

## 2020-01-30 ENCOUNTER — Other Ambulatory Visit: Admission: RE | Admit: 2020-01-30 | Payer: Medicare Other | Source: Ambulatory Visit

## 2020-02-01 ENCOUNTER — Encounter: Admission: RE | Payer: Self-pay | Source: Home / Self Care

## 2020-02-01 ENCOUNTER — Ambulatory Visit: Admission: RE | Admit: 2020-02-01 | Payer: Medicare Other | Source: Home / Self Care | Admitting: Ophthalmology

## 2020-02-01 SURGERY — PHACOEMULSIFICATION, CATARACT, WITH IOL INSERTION
Anesthesia: Topical | Laterality: Left

## 2020-04-23 ENCOUNTER — Encounter: Payer: Self-pay | Admitting: Ophthalmology

## 2020-04-23 ENCOUNTER — Other Ambulatory Visit: Payer: Self-pay

## 2020-04-25 ENCOUNTER — Other Ambulatory Visit
Admission: RE | Admit: 2020-04-25 | Discharge: 2020-04-25 | Disposition: A | Discharge status: Home / Self Care | Payer: Medicare Other | Source: Ambulatory Visit | Attending: Ophthalmology | Admitting: Ophthalmology

## 2020-04-25 ENCOUNTER — Other Ambulatory Visit: Payer: Self-pay

## 2020-04-25 DIAGNOSIS — Z01812 Encounter for preprocedural laboratory examination: Secondary | ICD-10-CM | POA: Diagnosis present

## 2020-04-25 DIAGNOSIS — Z20822 Contact with and (suspected) exposure to covid-19: Secondary | ICD-10-CM | POA: Insufficient documentation

## 2020-04-25 LAB — SARS CORONAVIRUS 2 (TAT 6-24 HRS): SARS Coronavirus 2: NEGATIVE

## 2020-04-25 NOTE — Discharge Instructions (Signed)

## 2020-04-29 ENCOUNTER — Ambulatory Visit
Admission: RE | Admit: 2020-04-29 | Discharge: 2020-04-29 | Disposition: A | Discharge status: Home / Self Care | Payer: Medicare Other | Attending: Ophthalmology | Admitting: Ophthalmology

## 2020-04-29 ENCOUNTER — Encounter
Admission: RE | Disposition: A | Discharge status: Home / Self Care | Payer: Self-pay | Source: Home / Self Care | Attending: Ophthalmology

## 2020-04-29 ENCOUNTER — Ambulatory Visit: Payer: Medicare Other | Admitting: Anesthesiology

## 2020-04-29 ENCOUNTER — Encounter: Payer: Self-pay | Admitting: Ophthalmology

## 2020-04-29 ENCOUNTER — Other Ambulatory Visit: Payer: Self-pay

## 2020-04-29 DIAGNOSIS — H2512 Age-related nuclear cataract, left eye: Secondary | ICD-10-CM | POA: Diagnosis present

## 2020-04-29 DIAGNOSIS — Z880 Allergy status to penicillin: Secondary | ICD-10-CM | POA: Diagnosis not present

## 2020-04-29 DIAGNOSIS — Z79899 Other long term (current) drug therapy: Secondary | ICD-10-CM | POA: Insufficient documentation

## 2020-04-29 DIAGNOSIS — Z85118 Personal history of other malignant neoplasm of bronchus and lung: Secondary | ICD-10-CM | POA: Insufficient documentation

## 2020-04-29 DIAGNOSIS — Z7901 Long term (current) use of anticoagulants: Secondary | ICD-10-CM | POA: Diagnosis not present

## 2020-04-29 DIAGNOSIS — Z7983 Long term (current) use of bisphosphonates: Secondary | ICD-10-CM | POA: Diagnosis not present

## 2020-04-29 HISTORY — PX: CATARACT EXTRACTION W/PHACO: SHX586

## 2020-04-29 HISTORY — DX: Other complications of anesthesia, initial encounter: T88.59XA

## 2020-04-29 HISTORY — DX: Other amnesia: R41.3

## 2020-04-29 HISTORY — DX: Dependence on other enabling machines and devices: Z99.89

## 2020-04-29 SURGERY — PHACOEMULSIFICATION, CATARACT, WITH IOL INSERTION
Anesthesia: Monitor Anesthesia Care | Site: Eye | Laterality: Left

## 2020-04-29 MED ORDER — LACTATED RINGERS IV SOLN: INTRAVENOUS | Status: DC

## 2020-04-29 MED ORDER — MIDAZOLAM HCL 2 MG/2ML IJ SOLN
INTRAMUSCULAR | Status: DC | PRN
  Administered 2020-04-29: 1 mg via INTRAVENOUS

## 2020-04-29 MED ORDER — EPINEPHRINE PF 1 MG/ML IJ SOLN
INTRAOCULAR | Status: DC | PRN
  Administered 2020-04-29: 67 mL via OPHTHALMIC

## 2020-04-29 MED ORDER — BRIMONIDINE TARTRATE-TIMOLOL 0.2-0.5 % OP SOLN
OPHTHALMIC | Status: DC | PRN
  Administered 2020-04-29: 1 [drp] via OPHTHALMIC

## 2020-04-29 MED ORDER — ACETAMINOPHEN 10 MG/ML IV SOLN: 1000.0000 mg | Freq: Once | INTRAVENOUS | Status: DC | PRN

## 2020-04-29 MED ORDER — ONDANSETRON HCL 4 MG/2ML IJ SOLN: 4.0000 mg | Freq: Once | INTRAMUSCULAR | Status: DC | PRN

## 2020-04-29 MED ORDER — LIDOCAINE HCL (PF) 2 % IJ SOLN
INTRAOCULAR | Status: DC | PRN
  Administered 2020-04-29: 2 mL

## 2020-04-29 MED ORDER — TETRACAINE HCL 0.5 % OP SOLN
1.0000 [drp] | OPHTHALMIC | Status: DC | PRN
  Administered 2020-04-29 (×3): 1 [drp] via OPHTHALMIC

## 2020-04-29 MED ORDER — MOXIFLOXACIN HCL 0.5 % OP SOLN
OPHTHALMIC | Status: DC | PRN
  Administered 2020-04-29: 0.2 mL via OPHTHALMIC

## 2020-04-29 MED ORDER — NA CHONDROIT SULF-NA HYALURON 40-17 MG/ML IO SOLN
INTRAOCULAR | Status: DC | PRN
  Administered 2020-04-29: 1 mL via INTRAOCULAR

## 2020-04-29 MED ORDER — ARMC OPHTHALMIC DILATING DROPS
1.0000 "application " | OPHTHALMIC | Status: DC | PRN
  Administered 2020-04-29 (×3): 1 via OPHTHALMIC

## 2020-04-29 MED ORDER — FENTANYL CITRATE (PF) 100 MCG/2ML IJ SOLN
INTRAMUSCULAR | Status: DC | PRN
  Administered 2020-04-29: 50 ug via INTRAVENOUS

## 2020-04-29 SURGICAL SUPPLY — 20 items
CANNULA ANT/CHMB 27G (MISCELLANEOUS) ×2 IMPLANT
CANNULA ANT/CHMB 27GA (MISCELLANEOUS) ×6 IMPLANT
CARTRIDGE ABBOTT (MISCELLANEOUS) ×2 IMPLANT
GLOVE SURG LX 8.0 MICRO (GLOVE) ×2
GLOVE SURG LX STRL 8.0 MICRO (GLOVE) ×1 IMPLANT
GLOVE SURG TRIUMPH 8.0 PF LTX (GLOVE) ×3 IMPLANT
GOWN STRL REUS W/ TWL LRG LVL3 (GOWN DISPOSABLE) ×2 IMPLANT
GOWN STRL REUS W/TWL LRG LVL3 (GOWN DISPOSABLE) ×6
LENS IOL TECNIS EYHANCE 24.5 (Intraocular Lens) ×2 IMPLANT
MARKER SKIN DUAL TIP RULER LAB (MISCELLANEOUS) ×3 IMPLANT
NDL FILTER BLUNT 18X1 1/2 (NEEDLE) ×1 IMPLANT
NEEDLE FILTER BLUNT 18X 1/2SAF (NEEDLE) ×2
NEEDLE FILTER BLUNT 18X1 1/2 (NEEDLE) ×1 IMPLANT
PACK EYE AFTER SURG (MISCELLANEOUS) ×3 IMPLANT
PACK OPTHALMIC (MISCELLANEOUS) ×3 IMPLANT
PACK PORFILIO (MISCELLANEOUS) ×3 IMPLANT
SYR 3ML LL SCALE MARK (SYRINGE) ×3 IMPLANT
SYR TB 1ML LUER SLIP (SYRINGE) ×3 IMPLANT
WATER STERILE IRR 250ML POUR (IV SOLUTION) ×3 IMPLANT
WIPE NON LINTING 3.25X3.25 (MISCELLANEOUS) ×3 IMPLANT

## 2020-04-29 NOTE — Anesthesia Preprocedure Evaluation (Signed)
Anesthesia Evaluation  Patient identified by MRN, date of birth, ID band Patient awake    Reviewed: Allergy & Precautions, NPO status , Patient's Chart, lab work & pertinent test results, reviewed documented beta blocker date and time   History of Anesthesia Complications Negative for: history of anesthetic complications  Airway Mallampati: III  TM Distance: >3 FB Neck ROM: Limited    Dental   Pulmonary asthma ,   RUL lung cancer   breath sounds clear to auscultation       Cardiovascular Exercise Tolerance: Poor hypertension, (-) angina+CHF and + DOE  + dysrhythmias (on Eliquis) Atrial Fibrillation  Rhythm:Regular Rate:Normal   TTE (12/2019): LVEF 40-45% WITH MILD LVH  MODERATE MR, TR  MILD AR, PR    Neuro/Psych PSYCHIATRIC DISORDERS Anxiety Depression Dementia    GI/Hepatic GERD  Controlled, Diverticulosis c/b perforation s/p colostomy & reversal   Endo/Other  diabetes (Pre-DM)  Renal/GU      Musculoskeletal   Abdominal   Peds  Hematology   Anesthesia Other Findings   Reproductive/Obstetrics                             Anesthesia Physical Anesthesia Plan  ASA: III  Anesthesia Plan: MAC   Post-op Pain Management:    Induction: Intravenous  PONV Risk Score and Plan: 2 and TIVA, Midazolam and Treatment may vary due to age or medical condition  Airway Management Planned: Nasal Cannula  Additional Equipment:   Intra-op Plan:   Post-operative Plan:   Informed Consent: I have reviewed the patients History and Physical, chart, labs and discussed the procedure including the risks, benefits and alternatives for the proposed anesthesia with the patient or authorized representative who has indicated his/her understanding and acceptance.       Plan Discussed with: CRNA and Anesthesiologist  Anesthesia Plan Comments:         Anesthesia Quick Evaluation

## 2020-04-29 NOTE — Anesthesia Procedure Notes (Signed)
Procedure Name: MAC Performed by: Luca Dyar, CRNA Pre-anesthesia Checklist: Patient identified, Emergency Drugs available, Suction available, Timeout performed and Patient being monitored Patient Re-evaluated:Patient Re-evaluated prior to induction Oxygen Delivery Method: Nasal cannula Placement Confirmation: positive ETCO2       

## 2020-04-29 NOTE — Transfer of Care (Signed)
Immediate Anesthesia Transfer of Care Note  Patient: Jody [name]  Procedure(s) Performed: CATARACT EXTRACTION PHACO AND INTRAOCULAR LENS PLACEMENT (IOC) LEFT 17.47 01:28.8 (Left Eye)  Patient Location: PACU  Anesthesia Type: MAC  Level of Consciousness: awake, alert  and patient cooperative  Airway and Oxygen Therapy: Patient Spontanous Breathing and Patient connected to supplemental oxygen  Post-op Assessment: Post-op Vital signs reviewed, Patient's Cardiovascular Status Stable, Respiratory Function Stable, Patent Airway and No signs of Nausea or vomiting  Post-op Vital Signs: Reviewed and stable  Complications: No complications documented.

## 2020-04-29 NOTE — H&P (Signed)
[employer]   Primary Care Physician:  Baxter Hire, MD Ophthalmologist: Dr. George Ina  Pre-Procedure History & Physical: HPI:  Jody [name] is a 79 y.o. female here for cataract surgery.   Past Medical History:  Diagnosis Date  . Asthma   . Atrial fibrillation ([place])   . CHF (congestive heart failure) ([place])   . 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 Dysrhythmia   . GERD (gastroesophageal reflux disease)   . Hypertension   . Memory difficulties   . Primary adenocarcinoma of right lung ([place]) 02/2005   Rad tx's.   . Uses walker     Past Surgical History:  Procedure Laterality Date  . COLOSTOMY N/A 01/05/2018   Procedure: COLOSTOMY;  Surgeon: Benjamine Sprague, DO;  Location: [facility] ORS;  Service: General;  Laterality: N/A;  . COLOSTOMY TAKEDOWN N/A 11/28/2018   Procedure: COLOSTOMY TAKEDOWN, DIABETIC;  Surgeon: Benjamine Sprague, DO;  Location: [facility] ORS;  Service: General;  Laterality: N/A;  . LAPAROTOMY N/A 01/05/2018   Procedure: EXPLORATORY LAPAROTOMY/ POSSIBLE BOWEL OBSTRUCTION;  Surgeon: Benjamine Sprague, DO;  Location: [facility] ORS;  Service: General;  Laterality: N/A;  . LYSIS OF ADHESION N/A 01/05/2018   Procedure: LYSIS OF ADHESION;  Surgeon: Benjamine Sprague, DO;  Location: [facility] ORS;  Service: General;  Laterality: N/A;    Prior to Admission medications   Medication Sig Start Date End Date Taking? Authorizing Provider  acetaminophen (TYLENOL) 325 MG tablet Take 2 tablets (650 mg total) by mouth every 6 (six) hours as needed (fever > 101). 12/03/18  Yes Piscoya, Jacqulyn Bath, MD  albuterol (PROVENTIL) (2.5 MG/3ML) 0.083% nebulizer solution Take 1.25 mg by nebulization every 6 (six) hours as needed for wheezing or shortness of breath.    Yes [provider]  albuterol (VENTOLIN HFA) 108 (90 Base) MCG/ACT inhaler Inhale 2 puffs into the lungs every 6 (six) hours as needed for wheezing or shortness of breath.    Yes [provider]  alendronate (FOSAMAX) 70  MG tablet Take 70 mg by mouth once a week.    Yes [provider]  ALPRAZolam Duanne Moron) 0.5 MG tablet Take 0.5 mg by mouth at bedtime.    Yes [provider]  apixaban (ELIQUIS) 5 MG TABS tablet Take 5 mg by mouth 2 (two) times daily.    Yes [provider]  Calcium Carbonate-Vitamin D (CALCIUM 600+D) 600-200 MG-UNIT TABS Take 1 tablet by mouth daily.   Yes [provider]  digoxin (LANOXIN) 0.125 MG tablet Take 1 tablet (0.125 mg total) by mouth daily. 01/22/18  Yes Gladstone Lighter, MD  diltiazem (DILACOR XR) 240 MG 24 hr capsule Take 240 mg by mouth daily.   Yes [provider]  FLUoxetine (PROZAC) 20 MG capsule Take 20 mg by mouth daily.  02/20/14  Yes [provider]  furosemide (LASIX) 20 MG tablet Take 20 mg by mouth daily.   Yes [provider]  lovastatin (MEVACOR) 20 MG tablet Take 20 mg by mouth daily with supper.    Yes [provider]  metoprolol succinate (TOPROL XL) 50 MG 24 hr tablet Take 1 tablet (50 mg total) by mouth daily. Take with or immediately following a meal. Patient taking differently: Take 50 mg by mouth 2 (two) times a day.  02/10/15 04/23/20 Yes Daymon Larsen, MD  montelukast (SINGULAIR) 10 MG tablet Take 10 mg by mouth at bedtime.  08/23/14  Yes [provider]  Multiple Vitamin (MULTI-VITAMINS)  TABS Take 1 tablet by mouth daily.    Yes [provider]  omeprazole (PRILOSEC) 20 MG capsule Take 20 mg by mouth in the morning and at bedtime.  08/15/14  Yes [provider]  ondansetron (ZOFRAN) 4 MG tablet Take 1 tablet (4 mg total) by mouth daily as needed. Patient taking differently: Take 4 mg by mouth daily as needed for nausea or vomiting.  03/30/18  Yes Schaevitz, Randall An, MD  traMADol (ULTRAM) 50 MG tablet Take 50 mg by mouth every 6 (six) hours as needed for moderate pain.   Yes [provider]  traZODone (DESYREL) 50 MG tablet Take 50 mg by mouth at  bedtime. 02/17/18 04/23/20 Yes [provider]  amiodarone (PACERONE) 200 MG tablet Take 1 tablet (200 mg total) by mouth 2 (two) times daily. Patient not taking: Reported on 11/21/2018 01/22/18   Gladstone Lighter, MD  potassium chloride (K-DUR) 10 MEQ tablet Take 10 mEq by mouth daily. Patient not taking: Reported on 04/23/2020    [provider]    Allergies as of 03/11/2020 - Review Complete 01/28/2020  Allergen Reaction Noted  . Poison oak extract Other (See Comments) 01/13/2015  . Ampicillin Rash 01/13/2015  . Penicillin v potassium Rash 01/13/2015    Family History  Problem Relation Age of Onset  . Breast cancer Paternal Aunt     Social History   Socioeconomic History  . Marital status: Married    Spouse name: Not on file  . Number of children: Not on file  . Years of education: Not on file  . Highest education level: Not on file  Occupational History  . Not on file  Tobacco Use  . Smoking status: Never Smoker  . Smokeless tobacco: Never Used  Vaping Use  . Vaping Use: Never used  Substance and Sexual Activity  . Alcohol use: No  . Drug use: Never  . Sexual activity: Not on file  Other Topics Concern  . Not on file  Social History Narrative  . Not on file   Social Determinants of Health   Financial Resource Strain:   . Difficulty of Paying Living Expenses: Not on file  Food Insecurity:   . Worried About Charity fundraiser in the Last Year: Not on file  . Ran Out of Food in the Last Year: Not on file  Transportation Needs:   . Lack of Transportation (Medical): Not on file  . Lack of Transportation (Non-Medical): Not on file  Physical Activity:   . Days of Exercise per Week: Not on file  . Minutes of Exercise per Session: Not on file  Stress:   . Feeling of Stress : Not on file  Social Connections:   . Frequency of Communication with Friends and Family: Not on file  . Frequency of Social Gatherings with Friends and Family: Not on file   . Attends Religious Services: Not on file  . Active Member of Clubs or Organizations: Not on file  . Attends Archivist Meetings: Not on file  . Marital Status: Not on file  Intimate Partner Violence:   . Fear of Current or Ex-Partner: Not on file  . Emotionally Abused: Not on file  . Physically Abused: Not on file  . Sexually Abused: Not on file    Review of Systems: See HPI, otherwise negative ROS  Physical Exam: BP (!) 138/99   Pulse 93   Temp 98.7 F (37.1 C) (Temporal)   Resp 18  Ht 4' 11.02" (1.499 m)   Wt 62.4 kg   SpO2 99%   BMI 27.76 kg/m  General:   Alert,  pleasant and cooperative in NAD Head:  Normocephalic and atraumatic. Respiratory:  Normal work of breathing. Heart:  Regular rate and rhythm.  Impression/Plan: Jody [name] is here for cataract surgery.  Risks, benefits, limitations, and alternatives regarding cataract surgery have been reviewed with the patient.  Questions have been answered.  All parties agreeable.   Birder Robson, MD  04/29/2020, 12:00 PM

## 2020-04-29 NOTE — Anesthesia Postprocedure Evaluation (Signed)
Anesthesia Post Note  Patient: Jody [name]  Procedure(s) Performed: CATARACT EXTRACTION PHACO AND INTRAOCULAR LENS PLACEMENT (IOC) LEFT 17.47 01:28.8 (Left Eye)     Patient location during evaluation: PACU Anesthesia Type: MAC Level of consciousness: awake and alert Pain management: pain level controlled Vital Signs Assessment: post-procedure vital signs reviewed and stable Respiratory status: spontaneous breathing, nonlabored ventilation, respiratory function stable and patient connected to nasal cannula oxygen Cardiovascular status: stable and blood pressure returned to baseline Postop Assessment: no apparent nausea or vomiting Anesthetic complications: no   No complications documented.  Bela Nyborg A  Fahed Morten

## 2020-04-29 NOTE — Op Note (Signed)
PREOPERATIVE DIAGNOSIS:  Nuclear sclerotic cataract of the left eye.   POSTOPERATIVE DIAGNOSIS:  Nuclear sclerotic cataract of the left eye.   OPERATIVE PROCEDURE:@   SURGEON:  Birder Robson, MD.   ANESTHESIA:  Anesthesiologist: Heniser, Fredric Dine, MD CRNA: Mayme Genta, CRNA  1.      Managed anesthesia care. 2.     0.57ml of Shugarcaine was instilled following the paracentesis   COMPLICATIONS:  None.   TECHNIQUE:   Stop and chop   DESCRIPTION OF PROCEDURE:  The patient was examined and consented in the preoperative holding area where the aforementioned topical anesthesia was applied to the left eye and then brought back to the Operating Room where the left eye was prepped and draped in the usual sterile ophthalmic fashion and a lid speculum was placed. A paracentesis was created with the side port blade and the anterior chamber was filled with viscoelastic. A near clear corneal incision was performed with the steel keratome. A continuous curvilinear capsulorrhexis was performed with a cystotome followed by the capsulorrhexis forceps. Hydrodissection and hydrodelineation were carried out with BSS on a blunt cannula. The lens was removed in a stop and chop  technique and the remaining cortical material was removed with the irrigation-aspiration handpiece. The capsular bag was inflated with viscoelastic and the Technis [id number] lens was placed in the capsular bag without complication. The remaining viscoelastic was removed from the eye with the irrigation-aspiration handpiece. The wounds were hydrated. The anterior chamber was flushed with BSS and the eye was inflated to physiologic pressure. 0.57ml Vigamox was placed in the anterior chamber. The wounds were found to be water tight. The eye was dressed with Combigan. The patient was given protective glasses to wear throughout the day and a shield with which to sleep tonight. The patient was also given drops with which to begin a drop regimen today  and will follow-up with me in one day. Implant Name Type Inv. Item Serial No. Manufacturer Lot No. LRB No. Used Action  LENS IOL TECNIS EYHANCE 24.5 - [id number] Intraocular Lens LENS IOL TECNIS EYHANCE 24.5 [id number] JOHNSON   Left 1 Implanted    Procedure(s): CATARACT EXTRACTION PHACO AND INTRAOCULAR LENS PLACEMENT (IOC) LEFT 17.47 01:28.8 (Left)  Electronically signed: Birder Robson 04/29/2020 12:32 PM

## 2020-05-05 ENCOUNTER — Other Ambulatory Visit: Payer: Self-pay | Admitting: Ophthalmology

## 2020-05-12 ENCOUNTER — Encounter: Payer: Self-pay | Admitting: Ophthalmology

## 2020-05-16 ENCOUNTER — Other Ambulatory Visit
Admission: RE | Admit: 2020-05-16 | Discharge: 2020-05-16 | Disposition: A | Discharge status: Home / Self Care | Payer: Medicare Other | Source: Ambulatory Visit | Attending: Ophthalmology | Admitting: Ophthalmology

## 2020-05-16 ENCOUNTER — Other Ambulatory Visit: Payer: Self-pay

## 2020-05-16 DIAGNOSIS — Z01812 Encounter for preprocedural laboratory examination: Secondary | ICD-10-CM | POA: Diagnosis present

## 2020-05-16 DIAGNOSIS — Z20822 Contact with and (suspected) exposure to covid-19: Secondary | ICD-10-CM | POA: Diagnosis not present

## 2020-05-16 NOTE — Discharge Instructions (Signed)
General Anesthesia, Adult, Care After This sheet gives you information about how to care for yourself after your procedure. Your health care provider may also give you more specific instructions. If you have problems or questions, contact your health care provider. What can I expect after the procedure? After the procedure, the following side effects are common:  Pain or discomfort at the IV site.  Nausea.  Vomiting.  Sore throat.  Trouble concentrating.  Feeling cold or chills.  Weak or tired.  Sleepiness and fatigue.  Soreness and body aches. These side effects can affect parts of the body that were not involved in surgery. Follow these instructions at home:  For at least 24 hours after the procedure:  Have a responsible adult stay with you. It is important to have someone help care for you until you are awake and alert.  Rest as needed.  Do not: ? Participate in activities in which you could fall or become injured. ? Drive. ? Use heavy machinery. ? Drink alcohol. ? Take sleeping pills or medicines that cause drowsiness. ? Make important decisions or sign legal documents. ? Take care of children on your own. Eating and drinking  Follow any instructions from your health care provider about eating or drinking restrictions.  When you feel hungry, start by eating small amounts of foods that are soft and easy to digest (bland), such as toast. Gradually return to your regular diet.  Drink enough fluid to keep your urine pale yellow.  If you vomit, rehydrate by drinking water, juice, or clear broth. General instructions  If you have sleep apnea, surgery and certain medicines can increase your risk for breathing problems. Follow instructions from your health care provider about wearing your sleep device: ? Anytime you are sleeping, including during daytime naps. ? While taking prescription pain medicines, sleeping medicines, or medicines that make you drowsy.  Return to  your normal activities as told by your health care provider. Ask your health care provider what activities are safe for you.  Take over-the-counter and prescription medicines only as told by your health care provider.  If you smoke, do not smoke without supervision.  Keep all follow-up visits as told by your health care provider. This is important. Contact a health care provider if:  You have nausea or vomiting that does not get better with medicine.  You cannot eat or drink without vomiting.  You have pain that does not get better with medicine.  You are unable to pass urine.  You develop a skin rash.  You have a fever.  You have redness around your IV site that gets worse. Get help right away if:  You have difficulty breathing.  You have chest pain.  You have blood in your urine or stool, or you vomit blood. Summary  After the procedure, it is common to have a sore throat or nausea. It is also common to feel tired.  Have a responsible adult stay with you for the first 24 hours after general anesthesia. It is important to have someone help care for you until you are awake and alert.  When you feel hungry, start by eating small amounts of foods that are soft and easy to digest (bland), such as toast. Gradually return to your regular diet.  Drink enough fluid to keep your urine pale yellow.  Return to your normal activities as told by your health care provider. Ask your health care provider what activities are safe for you. This information is not  intended to replace advice given to you by your health care provider. Make sure you discuss any questions you have with your health care provider. Document Revised: 05/27/2017 Document Reviewed: 01/07/2017 Elsevier Patient Education  [place].  Cataract Surgery, Care After This sheet gives you information about how to care for yourself after your procedure. Your health care provider may also give you more specific  instructions. If you have problems or questions, contact your health care provider. What can I expect after the procedure? After the procedure, it is common to have:  Itching.  Discomfort.  Fluid discharge.  Sensitivity to light and to touch.  Bruising in or around the eye.  Mild blurred vision. Follow these instructions at home: Eye care   Do not touch or rub your eyes.  Protect your eyes as told by your health care provider. You may be told to wear a protective eye shield or sunglasses.  Do not put a contact lens into the affected eye or eyes until your health care provider approves.  Keep the area around your eye clean and dry: ? Avoid swimming. ? Do not allow water to hit you directly in the face while showering. ? Keep soap and shampoo out of your eyes.  Check your eye every day for signs of infection. Watch for: ? Redness, swelling, or pain. ? Fluid, blood, or pus. ? Warmth. ? A bad smell. ? Vision that is getting worse. ? Sensitivity that is getting worse. Activity  Do not drive for 24 hours if you were given a sedative during your procedure.  Avoid strenuous activities, such as playing contact sports, for as long as told by your health care provider.  Do not drive or use heavy machinery until your health care provider approves.  Do not bend or lift heavy objects. Bending increases pressure in the eye. You can walk, climb stairs, and do light household chores.  Ask your health care provider when you can return to work. If you work in a dusty environment, you may be advised to wear protective eyewear for a period of time. General instructions  Take or apply over-the-counter and prescription medicines only as told by your health care provider. This includes eye drops.  Keep all follow-up visits as told by your health care provider. This is important. Contact a health care provider if:  You have increased bruising around your eye.  You have pain that is  not helped with medicine.  You have a fever.  You have redness, swelling, or pain in your eye.  You have fluid, blood, or pus coming from your incision.  Your vision gets worse.  Your sensitivity to light gets worse. Get help right away if:  You have sudden loss of vision.  You see flashes of light or spots (floaters).  You have severe eye pain.  You develop nausea or vomiting. Summary  After your procedure, it is common to have itching, discomfort, bruising, fluid discharge, or sensitivity to light.  Follow instructions from your health care provider about caring for your eye after the procedure.  Do not rub your eye after the procedure. You may need to wear eye protection or sunglasses. Do not wear contact lenses. Keep the area around your eye clean and dry.  Avoid activities that require a lot of effort. These include playing sports and lifting heavy objects.  Contact a health care provider if you have increased bruising, pain that does not go away, or a fever. Get help right  away if you suddenly lose your vision, see flashes of light or spots, or have severe pain in the eye. This information is not intended to replace advice given to you by your health care provider. Make sure you discuss any questions you have with your health care provider. Document Revised: 03/20/2019 Document Reviewed: 11/21/2017 Elsevier Patient Education  [place].

## 2020-05-17 LAB — SARS CORONAVIRUS 2 (TAT 6-24 HRS): SARS Coronavirus 2: NEGATIVE

## 2020-05-20 ENCOUNTER — Ambulatory Visit: Payer: Medicare Other | Admitting: Anesthesiology

## 2020-05-20 ENCOUNTER — Ambulatory Visit
Admission: RE | Admit: 2020-05-20 | Discharge: 2020-05-20 | Disposition: A | Discharge status: Home / Self Care | Payer: Medicare Other | Attending: Ophthalmology | Admitting: Ophthalmology

## 2020-05-20 ENCOUNTER — Other Ambulatory Visit: Payer: Self-pay

## 2020-05-20 ENCOUNTER — Encounter
Admission: RE | Disposition: A | Discharge status: Home / Self Care | Payer: Self-pay | Source: Home / Self Care | Attending: Ophthalmology

## 2020-05-20 DIAGNOSIS — Z881 Allergy status to other antibiotic agents status: Secondary | ICD-10-CM | POA: Insufficient documentation

## 2020-05-20 DIAGNOSIS — Z79899 Other long term (current) drug therapy: Secondary | ICD-10-CM | POA: Insufficient documentation

## 2020-05-20 DIAGNOSIS — Z880 Allergy status to penicillin: Secondary | ICD-10-CM | POA: Diagnosis not present

## 2020-05-20 DIAGNOSIS — Z7901 Long term (current) use of anticoagulants: Secondary | ICD-10-CM | POA: Diagnosis not present

## 2020-05-20 DIAGNOSIS — E1136 Type 2 diabetes mellitus with diabetic cataract: Secondary | ICD-10-CM | POA: Diagnosis present

## 2020-05-20 DIAGNOSIS — Z85118 Personal history of other malignant neoplasm of bronchus and lung: Secondary | ICD-10-CM | POA: Diagnosis not present

## 2020-05-20 DIAGNOSIS — Z9109 Other allergy status, other than to drugs and biological substances: Secondary | ICD-10-CM | POA: Insufficient documentation

## 2020-05-20 DIAGNOSIS — H2511 Age-related nuclear cataract, right eye: Secondary | ICD-10-CM | POA: Insufficient documentation

## 2020-05-20 HISTORY — PX: CATARACT EXTRACTION W/PHACO: SHX586

## 2020-05-20 SURGERY — PHACOEMULSIFICATION, CATARACT, WITH IOL INSERTION
Anesthesia: Monitor Anesthesia Care | Site: Eye | Laterality: Right

## 2020-05-20 MED ORDER — BRIMONIDINE TARTRATE-TIMOLOL 0.2-0.5 % OP SOLN
OPHTHALMIC | Status: DC | PRN
  Administered 2020-05-20: 1 [drp] via OPHTHALMIC

## 2020-05-20 MED ORDER — MEPERIDINE HCL 25 MG/ML IJ SOLN: 6.2500 mg | INTRAMUSCULAR | Status: DC | PRN

## 2020-05-20 MED ORDER — FENTANYL CITRATE (PF) 100 MCG/2ML IJ SOLN
INTRAMUSCULAR | Status: DC | PRN
  Administered 2020-05-20: 50 ug via INTRAVENOUS

## 2020-05-20 MED ORDER — OXYCODONE HCL 5 MG PO TABS: 5.0000 mg | ORAL_TABLET | Freq: Once | ORAL | Status: DC | PRN

## 2020-05-20 MED ORDER — LACTATED RINGERS IV SOLN: INTRAVENOUS | Status: DC

## 2020-05-20 MED ORDER — NA CHONDROIT SULF-NA HYALURON 40-17 MG/ML IO SOLN
INTRAOCULAR | Status: DC | PRN
  Administered 2020-05-20: 1 mL via INTRAOCULAR

## 2020-05-20 MED ORDER — LIDOCAINE HCL (PF) 2 % IJ SOLN
INTRAOCULAR | Status: DC | PRN
  Administered 2020-05-20: 2 mL

## 2020-05-20 MED ORDER — ARMC OPHTHALMIC DILATING DROPS
1.0000 "application " | OPHTHALMIC | Status: DC | PRN
  Administered 2020-05-20 (×3): 1 via OPHTHALMIC

## 2020-05-20 MED ORDER — TETRACAINE HCL 0.5 % OP SOLN
1.0000 [drp] | OPHTHALMIC | Status: DC | PRN
  Administered 2020-05-20 (×3): 1 [drp] via OPHTHALMIC

## 2020-05-20 MED ORDER — FENTANYL CITRATE (PF) 100 MCG/2ML IJ SOLN: 25.0000 ug | INTRAMUSCULAR | Status: DC | PRN

## 2020-05-20 MED ORDER — MOXIFLOXACIN HCL 0.5 % OP SOLN
OPHTHALMIC | Status: DC | PRN
  Administered 2020-05-20: 0.2 mL via OPHTHALMIC

## 2020-05-20 MED ORDER — PROMETHAZINE HCL 25 MG/ML IJ SOLN: 6.2500 mg | INTRAMUSCULAR | Status: DC | PRN

## 2020-05-20 MED ORDER — MIDAZOLAM HCL 2 MG/2ML IJ SOLN
INTRAMUSCULAR | Status: DC | PRN
  Administered 2020-05-20: 1 mg via INTRAVENOUS

## 2020-05-20 MED ORDER — EPINEPHRINE PF 1 MG/ML IJ SOLN
INTRAOCULAR | Status: DC | PRN
  Administered 2020-05-20: 11:00:00 56 mL via OPHTHALMIC

## 2020-05-20 MED ORDER — OXYCODONE HCL 5 MG/5ML PO SOLN: 5.0000 mg | Freq: Once | ORAL | Status: DC | PRN

## 2020-05-20 SURGICAL SUPPLY — 17 items
CANNULA ANT/CHMB 27GA (MISCELLANEOUS) ×6 IMPLANT
GLOVE SURG LX 8.0 MICRO (GLOVE) ×2
GLOVE SURG LX STRL 8.0 MICRO (GLOVE) ×1 IMPLANT
GLOVE SURG TRIUMPH 8.0 PF LTX (GLOVE) ×3 IMPLANT
GOWN STRL REUS W/ TWL LRG LVL3 (GOWN DISPOSABLE) ×2 IMPLANT
GOWN STRL REUS W/TWL LRG LVL3 (GOWN DISPOSABLE) ×6
LENS IOL TECNIS EYHANCE 23.5 (Intraocular Lens) ×3 IMPLANT
MARKER SKIN DUAL TIP RULER LAB (MISCELLANEOUS) ×3 IMPLANT
NEEDLE FILTER BLUNT 18X 1/2SAF (NEEDLE) ×2
NEEDLE FILTER BLUNT 18X1 1/2 (NEEDLE) ×1 IMPLANT
PACK EYE AFTER SURG (MISCELLANEOUS) ×3 IMPLANT
PACK OPTHALMIC (MISCELLANEOUS) ×3 IMPLANT
PACK PORFILIO (MISCELLANEOUS) ×3 IMPLANT
SYR 3ML LL SCALE MARK (SYRINGE) ×3 IMPLANT
SYR TB 1ML LUER SLIP (SYRINGE) ×3 IMPLANT
WATER STERILE IRR 250ML POUR (IV SOLUTION) ×3 IMPLANT
WIPE NON LINTING 3.25X3.25 (MISCELLANEOUS) ×3 IMPLANT

## 2020-05-20 NOTE — H&P (Signed)
[place]   Primary Care Physician:  Baxter Hire, MD Ophthalmologist: Dr. George Ina  Pre-Procedure History & Physical: HPI:  Jody [name] is a 79 y.o. female here for cataract surgery.   Past Medical History:  Diagnosis Date  . Asthma   . Atrial fibrillation ([place])   . CHF (congestive heart failure) ([place])   . 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 Dysrhythmia   . GERD (gastroesophageal reflux disease)   . Hypertension   . Memory difficulties   . Primary adenocarcinoma of right lung ([place]) 02/2005   Rad tx's.   . Uses walker     Past Surgical History:  Procedure Laterality Date  . CATARACT EXTRACTION W/PHACO Left 04/29/2020   Procedure: CATARACT EXTRACTION PHACO AND INTRAOCULAR LENS PLACEMENT (IOC) LEFT 17.47 01:28.8;  Surgeon: Birder Robson, MD;  Location: [place];  Service: Ophthalmology;  Laterality: Left;  . COLOSTOMY N/A 01/05/2018   Procedure: COLOSTOMY;  Surgeon: Benjamine Sprague, DO;  Location: [facility];  Service: General;  Laterality: N/A;  . COLOSTOMY TAKEDOWN N/A 11/28/2018   Procedure: COLOSTOMY TAKEDOWN, DIABETIC;  Surgeon: Benjamine Sprague, DO;  Location: [facility];  Service: General;  Laterality: N/A;  . LAPAROTOMY N/A 01/05/2018   Procedure: EXPLORATORY LAPAROTOMY/ POSSIBLE BOWEL OBSTRUCTION;  Surgeon: Benjamine Sprague, DO;  Location: [facility];  Service: General;  Laterality: N/A;  . LYSIS OF ADHESION N/A 01/05/2018   Procedure: LYSIS OF ADHESION;  Surgeon: Benjamine Sprague, DO;  Location: [facility];  Service: General;  Laterality: N/A;    Prior to Admission medications   Medication Sig Start Date End Date Taking? Authorizing Provider  acetaminophen (TYLENOL) 325 MG tablet Take 2 tablets (650 mg total) by mouth every 6 (six) hours as needed (fever > 101). 12/03/18  Yes Piscoya, Jacqulyn Bath, MD  albuterol (PROVENTIL) (2.5 MG/3ML) 0.083% nebulizer solution Take 1.25 mg by nebulization every 6 (six) hours as needed for wheezing or shortness of  breath.    Yes [provider]  albuterol (VENTOLIN HFA) 108 (90 Base) MCG/ACT inhaler Inhale 2 puffs into the lungs every 6 (six) hours as needed for wheezing or shortness of breath.    Yes [provider]  alendronate (FOSAMAX) 70 MG tablet Take 70 mg by mouth once a week.    Yes [provider]  ALPRAZolam Duanne Moron) 0.5 MG tablet Take 0.5 mg by mouth at bedtime.   Yes [provider]  apixaban (ELIQUIS) 5 MG TABS tablet Take 5 mg by mouth 2 (two) times daily.    Yes [provider]  Calcium Carbonate-Vitamin D 600-200 MG-UNIT TABS Take 1 tablet by mouth daily.   Yes [provider]  digoxin (LANOXIN) 0.125 MG tablet Take 1 tablet (0.125 mg total) by mouth daily. 01/22/18  Yes Gladstone Lighter, MD  diltiazem (DILACOR XR) 240 MG 24 hr capsule Take 240 mg by mouth daily.   Yes [provider]  FLUoxetine (PROZAC) 20 MG capsule Take 20 mg by mouth daily.  02/20/14  Yes [provider]  furosemide (LASIX) 20 MG tablet Take 20 mg by mouth daily.   Yes [provider]  lovastatin (MEVACOR) 20 MG tablet Take 20 mg by mouth daily with supper.    Yes [provider]  metoprolol succinate (TOPROL XL) 50 MG 24 hr tablet Take 1 tablet (50 mg total) by mouth daily. Take with or immediately following a meal. Patient taking differently: Take 50 mg by mouth 2 (two) times  a day. 02/10/15 05/12/20 Yes Daymon Larsen, MD  montelukast (SINGULAIR) 10 MG tablet Take 10 mg by mouth at bedtime.  08/23/14  Yes [provider]  Multiple Vitamin (MULTI-VITAMINS) TABS Take 1 tablet by mouth daily.    Yes [provider]  omeprazole (PRILOSEC) 20 MG capsule Take 20 mg by mouth in the morning and at bedtime.  08/15/14  Yes [provider]  ondansetron (ZOFRAN) 4 MG tablet Take 1 tablet (4 mg total) by mouth daily as needed. Patient taking differently: Take 4 mg by mouth daily as needed for nausea or vomiting.   03/30/18  Yes Schaevitz, Randall An, MD  traMADol (ULTRAM) 50 MG tablet Take 50 mg by mouth every 6 (six) hours as needed for moderate pain.   Yes [provider]  traZODone (DESYREL) 50 MG tablet Take 50 mg by mouth at bedtime. 02/17/18 05/20/20 Yes [provider]  amiodarone (PACERONE) 200 MG tablet Take 1 tablet (200 mg total) by mouth 2 (two) times daily. Patient not taking: Reported on 11/21/2018 01/22/18   Gladstone Lighter, MD  potassium chloride (K-DUR) 10 MEQ tablet Take 10 mEq by mouth daily. Patient not taking: Reported on 04/23/2020    [provider]    Allergies as of 05/06/2020 - Review Complete 04/29/2020  Allergen Reaction Noted  . Donepezil  04/23/2020  . Poison oak extract Other (See Comments) 01/13/2015  . Ampicillin Rash 01/13/2015  . Penicillin v potassium Rash 01/13/2015    Family History  Problem Relation Age of Onset  . Breast cancer Paternal Aunt     Social History   Socioeconomic History  . Marital status: Married    Spouse name: Not on file  . Number of children: Not on file  . Years of education: Not on file  . Highest education level: Not on file  Occupational History  . Not on file  Tobacco Use  . Smoking status: Never Smoker  . Smokeless tobacco: Never Used  Vaping Use  . Vaping Use: Never used  Substance and Sexual Activity  . Alcohol use: No  . Drug use: Never  . Sexual activity: Not on file  Other Topics Concern  . Not on file  Social History Narrative  . Not on file   Social Determinants of Health   Financial Resource Strain: Not on file  Food Insecurity: Not on file  Transportation Needs: Not on file  Physical Activity: Not on file  Stress: Not on file  Social Connections: Not on file  Intimate Partner Violence: Not on file    Review of Systems: See HPI, otherwise negative ROS  Physical Exam: BP 113/79   Pulse (!) 110   Temp (!) 96.8 F (36 C) (Temporal)   Wt 61.7 kg   SpO2 98%   BMI  27.45 kg/m  General:   Alert,  pleasant and cooperative in NAD Head:  Normocephalic and atraumatic. Respiratory:  Normal work of breathing. Heart:  Regular rate and rhythm.   Impression/Plan: Jody [name] is here for cataract surgery.  Risks, benefits, limitations, and alternatives regarding cataract surgery have been reviewed with the patient.  Questions have been answered.  All parties agreeable.   Birder Robson, MD  05/20/2020, 10:28 AM

## 2020-05-20 NOTE — Transfer of Care (Signed)
Immediate Anesthesia Transfer of Care Note  Patient: Jody [name]  Procedure(s) Performed: CATARACT EXTRACTION PHACO AND INTRAOCULAR LENS PLACEMENT (IOC) RIGHT 14.89 01:13.8 (Right Eye)  Patient Location: PACU  Anesthesia Type: MAC  Level of Consciousness: awake, alert  and patient cooperative  Airway and Oxygen Therapy: Patient Spontanous Breathing and Patient connected to supplemental oxygen  Post-op Assessment: Post-op Vital signs reviewed, Patient's Cardiovascular Status Stable, Respiratory Function Stable, Patent Airway and No signs of Nausea or vomiting  Post-op Vital Signs: Reviewed and stable  Complications: No complications documented.

## 2020-05-20 NOTE — Anesthesia Procedure Notes (Signed)
Procedure Name: MAC Date/Time: 05/20/2020 10:45 AM Performed by: Cameron Ali, CRNA Pre-anesthesia Checklist: Patient identified, Emergency Drugs available, Suction available, Timeout performed and Patient being monitored Patient Re-evaluated:Patient Re-evaluated prior to induction Oxygen Delivery Method: Nasal cannula Placement Confirmation: positive ETCO2

## 2020-05-20 NOTE — Op Note (Signed)
PREOPERATIVE DIAGNOSIS:  Nuclear sclerotic cataract of the right eye.   POSTOPERATIVE DIAGNOSIS:  Cataract   OPERATIVE PROCEDURE:@   SURGEON:  Birder Robson, MD.   ANESTHESIA:  Anesthesiologist: Marice Potter, MD CRNA: Cameron Ali, CRNA  1.      Managed anesthesia care. 2.      0.26ml of Shugarcaine was instilled in the eye following the paracentesis.   COMPLICATIONS:  None.   TECHNIQUE:   Stop and chop   DESCRIPTION OF PROCEDURE:  The patient was examined and consented in the preoperative holding area where the aforementioned topical anesthesia was applied to the right eye and then brought back to the Operating Room where the right eye was prepped and draped in the usual sterile ophthalmic fashion and a lid speculum was placed. A paracentesis was created with the side port blade and the anterior chamber was filled with viscoelastic. A near clear corneal incision was performed with the steel keratome. A continuous curvilinear capsulorrhexis was performed with a cystotome followed by the capsulorrhexis forceps. Hydrodissection and hydrodelineation were carried out with BSS on a blunt cannula. The lens was removed in a stop and chop  technique and the remaining cortical material was removed with the irrigation-aspiration handpiece. The capsular bag was inflated with viscoelastic and the Technis [id number]  lens was placed in the capsular bag without complication. The remaining viscoelastic was removed from the eye with the irrigation-aspiration handpiece. The wounds were hydrated. The anterior chamber was flushed with BSS and the eye was inflated to physiologic pressure. 0.48ml of Vigamox was placed in the anterior chamber. The wounds were found to be water tight. The eye was dressed with Combigan. The patient was given protective glasses to wear throughout the day and a shield with which to sleep tonight. The patient was also given drops with which to begin a drop regimen today and will  follow-up with me in one day. Implant Name Type Inv. Item Serial No. Manufacturer Lot No. LRB No. Used Action  LENS IOL TECNIS EYHANCE 23.5 - [id number] Intraocular Lens LENS IOL TECNIS EYHANCE 23.5 [id number] JOHNSON   Right 1 Implanted   Procedure(s): CATARACT EXTRACTION PHACO AND INTRAOCULAR LENS PLACEMENT (IOC) RIGHT 14.89 01:13.8 (Right)  Electronically signed: Birder Robson 05/20/2020 10:56 AM

## 2020-05-20 NOTE — Anesthesia Preprocedure Evaluation (Signed)
Anesthesia Evaluation  Patient identified by MRN, date of birth, ID band Patient awake    Reviewed: Allergy & Precautions, NPO status , Patient's Chart, lab work & pertinent test results, reviewed documented beta blocker date and time   History of Anesthesia Complications Negative for: history of anesthetic complications  Airway Mallampati: III  TM Distance: >3 FB Neck ROM: Limited    Dental   Pulmonary asthma ,   RUL lung cancer   breath sounds clear to auscultation       Cardiovascular Exercise Tolerance: Poor hypertension, (-) angina+CHF and + DOE  + dysrhythmias (on Eliquis) Atrial Fibrillation  Rhythm:Regular Rate:Normal   TTE (12/2019): LVEF 40-45% WITH MILD LVH  MODERATE MR, TR  MILD AR, PR    Neuro/Psych PSYCHIATRIC DISORDERS Anxiety Depression Dementia    GI/Hepatic GERD  Controlled, Diverticulosis c/b perforation s/p colostomy & reversal   Endo/Other  diabetes (Pre-DM)  Renal/GU      Musculoskeletal   Abdominal   Peds  Hematology   Anesthesia Other Findings   Reproductive/Obstetrics                             Anesthesia Physical  Anesthesia Plan  ASA: III  Anesthesia Plan: MAC   Post-op Pain Management:    Induction: Intravenous  PONV Risk Score and Plan: 2 and TIVA, Midazolam and Treatment may vary due to age or medical condition  Airway Management Planned: Nasal Cannula  Additional Equipment:   Intra-op Plan:   Post-operative Plan:   Informed Consent: I have reviewed the patients History and Physical, chart, labs and discussed the procedure including the risks, benefits and alternatives for the proposed anesthesia with the patient or authorized representative who has indicated his/her understanding and acceptance.       Plan Discussed with: CRNA and Anesthesiologist  Anesthesia Plan Comments:         Anesthesia Quick Evaluation

## 2020-05-20 NOTE — Anesthesia Postprocedure Evaluation (Signed)
Anesthesia Post Note  Patient: QUATISHA ZYLKA  Procedure(s) Performed: CATARACT EXTRACTION PHACO AND INTRAOCULAR LENS PLACEMENT (IOC) RIGHT 14.89 01:13.8 (Right Eye)     Patient location during evaluation: PACU Anesthesia Type: MAC Level of consciousness: awake and alert Pain management: pain level controlled Vital Signs Assessment: post-procedure vital signs reviewed and stable Respiratory status: spontaneous breathing, nonlabored ventilation, respiratory function stable and patient connected to nasal cannula oxygen Cardiovascular status: stable and blood pressure returned to baseline Postop Assessment: no apparent nausea or vomiting Anesthetic complications: no   No complications documented.  Yarielys Beed, Glade Stanford

## 2020-05-21 ENCOUNTER — Encounter: Payer: Self-pay | Admitting: Ophthalmology

## 2020-08-29 ENCOUNTER — Inpatient Hospital Stay
Admission: EM | Admit: 2020-08-29 | Discharge: 2020-09-01 | DRG: 291 | Disposition: A | Discharge status: Home / Self Care | Payer: Medicare Other | Attending: Internal Medicine | Admitting: Internal Medicine

## 2020-08-29 ENCOUNTER — Other Ambulatory Visit: Payer: Self-pay

## 2020-08-29 ENCOUNTER — Emergency Department: Payer: Medicare Other

## 2020-08-29 DIAGNOSIS — I10 Essential (primary) hypertension: Secondary | ICD-10-CM | POA: Diagnosis not present

## 2020-08-29 DIAGNOSIS — E871 Hypo-osmolality and hyponatremia: Secondary | ICD-10-CM | POA: Diagnosis present

## 2020-08-29 DIAGNOSIS — G309 Alzheimer's disease, unspecified: Secondary | ICD-10-CM | POA: Diagnosis present

## 2020-08-29 DIAGNOSIS — I5033 Acute on chronic diastolic (congestive) heart failure: Secondary | ICD-10-CM | POA: Diagnosis present

## 2020-08-29 DIAGNOSIS — I110 Hypertensive heart disease with heart failure: Secondary | ICD-10-CM | POA: Diagnosis present

## 2020-08-29 DIAGNOSIS — Z91048 Other nonmedicinal substance allergy status: Secondary | ICD-10-CM | POA: Diagnosis not present

## 2020-08-29 DIAGNOSIS — F0280 Dementia in other diseases classified elsewhere without behavioral disturbance: Secondary | ICD-10-CM | POA: Diagnosis present

## 2020-08-29 DIAGNOSIS — Z20822 Contact with and (suspected) exposure to covid-19: Secondary | ICD-10-CM | POA: Diagnosis present

## 2020-08-29 DIAGNOSIS — I4891 Unspecified atrial fibrillation: Principal | ICD-10-CM | POA: Diagnosis present

## 2020-08-29 DIAGNOSIS — I509 Heart failure, unspecified: Secondary | ICD-10-CM | POA: Diagnosis not present

## 2020-08-29 DIAGNOSIS — Z923 Personal history of irradiation: Secondary | ICD-10-CM | POA: Diagnosis not present

## 2020-08-29 DIAGNOSIS — Z880 Allergy status to penicillin: Secondary | ICD-10-CM | POA: Diagnosis not present

## 2020-08-29 DIAGNOSIS — Z7983 Long term (current) use of bisphosphonates: Secondary | ICD-10-CM

## 2020-08-29 DIAGNOSIS — E785 Hyperlipidemia, unspecified: Secondary | ICD-10-CM | POA: Diagnosis present

## 2020-08-29 DIAGNOSIS — F0390 Unspecified dementia without behavioral disturbance: Secondary | ICD-10-CM

## 2020-08-29 DIAGNOSIS — K219 Gastro-esophageal reflux disease without esophagitis: Secondary | ICD-10-CM | POA: Diagnosis present

## 2020-08-29 DIAGNOSIS — J90 Pleural effusion, not elsewhere classified: Secondary | ICD-10-CM | POA: Diagnosis not present

## 2020-08-29 DIAGNOSIS — Z85118 Personal history of other malignant neoplasm of bronchus and lung: Secondary | ICD-10-CM

## 2020-08-29 DIAGNOSIS — J45909 Unspecified asthma, uncomplicated: Secondary | ICD-10-CM | POA: Diagnosis present

## 2020-08-29 DIAGNOSIS — I480 Paroxysmal atrial fibrillation: Secondary | ICD-10-CM | POA: Diagnosis present

## 2020-08-29 DIAGNOSIS — Z79899 Other long term (current) drug therapy: Secondary | ICD-10-CM | POA: Diagnosis not present

## 2020-08-29 DIAGNOSIS — F32A Depression, unspecified: Secondary | ICD-10-CM | POA: Diagnosis present

## 2020-08-29 DIAGNOSIS — Z7901 Long term (current) use of anticoagulants: Secondary | ICD-10-CM | POA: Diagnosis not present

## 2020-08-29 DIAGNOSIS — Z888 Allergy status to other drugs, medicaments and biological substances status: Secondary | ICD-10-CM | POA: Diagnosis not present

## 2020-08-29 DIAGNOSIS — E878 Other disorders of electrolyte and fluid balance, not elsewhere classified: Secondary | ICD-10-CM | POA: Diagnosis present

## 2020-08-29 LAB — COMPREHENSIVE METABOLIC PANEL
ALT: 17 U/L (ref 0–44)
AST: 20 U/L (ref 15–41)
Albumin: 3.7 g/dL (ref 3.5–5.0)
Alkaline Phosphatase: 45 U/L (ref 38–126)
Anion gap: 10 (ref 5–15)
BUN: 17 mg/dL (ref 8–23)
CO2: 21 mmol/L — ABNORMAL LOW (ref 22–32)
Calcium: 9.1 mg/dL (ref 8.9–10.3)
Chloride: 97 mmol/L — ABNORMAL LOW (ref 98–111)
Creatinine, Ser: 0.92 mg/dL (ref 0.44–1.00)
GFR, Estimated: >60 mL/min (ref >60)
Glucose, Bld: 175 mg/dL — ABNORMAL HIGH (ref 70–99)
Potassium: 3.8 mmol/L (ref 3.5–5.1)
Sodium: 128 mmol/L — ABNORMAL LOW (ref 135–145)
Total Bilirubin: 1.5 mg/dL — ABNORMAL HIGH (ref 0.3–1.2)
Total Protein: 6.6 g/dL (ref 6.5–8.1)

## 2020-08-29 LAB — CBC
HCT: 36.5 % (ref 36.0–46.0)
Hemoglobin: 12.5 g/dL (ref 12.0–15.0)
MCH: 28.2 pg (ref 26.0–34.0)
MCHC: 34.2 g/dL (ref 30.0–36.0)
MCV: 82.2 fL (ref 80.0–100.0)
Platelets: 320 10*3/uL (ref 150–400)
RBC: 4.44 MIL/uL (ref 3.87–5.11)
RDW: 15.7 % — ABNORMAL HIGH (ref 11.5–15.5)
WBC: 10.9 10*3/uL — ABNORMAL HIGH (ref 4.0–10.5)
nRBC: 0 % (ref 0.0–0.2)

## 2020-08-29 LAB — URINALYSIS, COMPLETE (UACMP) WITH MICROSCOPIC
Bilirubin Urine: NEGATIVE
Glucose, UA: NEGATIVE mg/dL
Ketones, ur: NEGATIVE mg/dL
Nitrite: NEGATIVE
Protein, ur: NEGATIVE mg/dL
Specific Gravity, Urine: 1.004 — ABNORMAL LOW (ref 1.005–1.030)
pH: 6 (ref 5.0–8.0)

## 2020-08-29 LAB — RESP PANEL BY RT-PCR (FLU A&B, COVID) ARPGX2
Influenza A by PCR: NEGATIVE
Influenza B by PCR: NEGATIVE
SARS Coronavirus 2 by RT PCR: NEGATIVE

## 2020-08-29 LAB — TSH: TSH: 1.314 u[IU]/mL (ref 0.350–4.500)

## 2020-08-29 LAB — BRAIN NATRIURETIC PEPTIDE: B Natriuretic Peptide: 1896.8 pg/mL — ABNORMAL HIGH (ref 0.0–100.0)

## 2020-08-29 LAB — TROPONIN I (HIGH SENSITIVITY)
Troponin I (High Sensitivity): 7 ng/L (ref <18)
Troponin I (High Sensitivity): 7 ng/L (ref <18)

## 2020-08-29 LAB — MAGNESIUM: Magnesium: 1.7 mg/dL (ref 1.7–2.4)

## 2020-08-29 MED ORDER — ACETAMINOPHEN 650 MG RE SUPP: 650.0000 mg | Freq: Four times a day (QID) | RECTAL | Status: DC | PRN

## 2020-08-29 MED ORDER — MAGNESIUM HYDROXIDE 400 MG/5ML PO SUSP: 30.0000 mL | Freq: Every day | ORAL | Status: DC | PRN

## 2020-08-29 MED ORDER — TRAZODONE HCL 50 MG PO TABS
50.0000 mg | ORAL_TABLET | Freq: Every day | ORAL | Status: DC
  Administered 2020-08-30 – 2020-08-31 (×3): 50 mg via ORAL
  Filled 2020-08-29 (×3): qty 1

## 2020-08-29 MED ORDER — ALBUTEROL SULFATE HFA 108 (90 BASE) MCG/ACT IN AERS
2.0000 | INHALATION_SPRAY | Freq: Four times a day (QID) | RESPIRATORY_TRACT | Status: DC | PRN
  Filled 2020-08-29: qty 6.7

## 2020-08-29 MED ORDER — ALPRAZOLAM 0.5 MG PO TABS
0.5000 mg | ORAL_TABLET | Freq: Every day | ORAL | Status: DC
  Administered 2020-08-30 – 2020-08-31 (×3): 0.5 mg via ORAL
  Filled 2020-08-29 (×3): qty 1

## 2020-08-29 MED ORDER — ACETAMINOPHEN 325 MG PO TABS: 650.0000 mg | ORAL_TABLET | Freq: Four times a day (QID) | ORAL | Status: DC | PRN

## 2020-08-29 MED ORDER — ADULT MULTIVITAMIN W/MINERALS CH
1.0000 | ORAL_TABLET | Freq: Every day | ORAL | Status: DC
  Administered 2020-08-30 – 2020-09-01 (×3): 1 via ORAL
  Filled 2020-08-29 (×3): qty 1

## 2020-08-29 MED ORDER — MONTELUKAST SODIUM 10 MG PO TABS: 10.0000 mg | ORAL_TABLET | Freq: Every day | ORAL | Status: DC

## 2020-08-29 MED ORDER — DILTIAZEM HCL-DEXTROSE 125-5 MG/125ML-% IV SOLN (PREMIX)
5.0000 mg/h | INTRAVENOUS | Status: DC
  Administered 2020-08-29: 5 mg/h via INTRAVENOUS
  Administered 2020-08-30 (×2): 15 mg/h via INTRAVENOUS
  Filled 2020-08-29 (×4): qty 125

## 2020-08-29 MED ORDER — FUROSEMIDE 10 MG/ML IJ SOLN
40.0000 mg | Freq: Once | INTRAMUSCULAR | Status: AC
  Administered 2020-08-29: 40 mg via INTRAVENOUS
  Filled 2020-08-29: qty 4

## 2020-08-29 MED ORDER — FUROSEMIDE 10 MG/ML IJ SOLN
40.0000 mg | Freq: Two times a day (BID) | INTRAMUSCULAR | Status: DC
  Administered 2020-08-29 – 2020-08-31 (×4): 40 mg via INTRAVENOUS
  Filled 2020-08-29 (×4): qty 4

## 2020-08-29 MED ORDER — TRAZODONE HCL 50 MG PO TABS: 25.0000 mg | ORAL_TABLET | Freq: Every evening | ORAL | Status: DC | PRN

## 2020-08-29 MED ORDER — ALENDRONATE SODIUM 70 MG PO TABS
70.0000 mg | ORAL_TABLET | ORAL | Status: DC
Start: 2020-08-29 — End: 2020-08-29

## 2020-08-29 MED ORDER — ALBUTEROL SULFATE (2.5 MG/3ML) 0.083% IN NEBU: 1.2500 mg | INHALATION_SOLUTION | Freq: Four times a day (QID) | RESPIRATORY_TRACT | Status: DC | PRN

## 2020-08-29 MED ORDER — ONDANSETRON HCL 4 MG PO TABS: 4.0000 mg | ORAL_TABLET | Freq: Four times a day (QID) | ORAL | Status: DC | PRN

## 2020-08-29 MED ORDER — DIGOXIN 125 MCG PO TABS
0.1250 mg | ORAL_TABLET | Freq: Every day | ORAL | Status: DC
  Administered 2020-08-31 – 2020-09-01 (×2): 0.125 mg via ORAL
  Filled 2020-08-29 (×3): qty 1

## 2020-08-29 MED ORDER — AMIODARONE HCL 200 MG PO TABS
200.0000 mg | ORAL_TABLET | Freq: Two times a day (BID) | ORAL | Status: DC
  Administered 2020-08-30 – 2020-09-01 (×5): 200 mg via ORAL
  Filled 2020-08-29 (×6): qty 1

## 2020-08-29 MED ORDER — ONDANSETRON HCL 4 MG/2ML IJ SOLN: 4.0000 mg | Freq: Four times a day (QID) | INTRAMUSCULAR | Status: DC | PRN

## 2020-08-29 MED ORDER — PANTOPRAZOLE SODIUM 40 MG PO TBEC
40.0000 mg | DELAYED_RELEASE_TABLET | Freq: Every day | ORAL | Status: DC
  Administered 2020-08-30 – 2020-09-01 (×3): 40 mg via ORAL
  Filled 2020-08-29 (×3): qty 1

## 2020-08-29 MED ORDER — APIXABAN 5 MG PO TABS
5.0000 mg | ORAL_TABLET | Freq: Two times a day (BID) | ORAL | Status: DC
  Administered 2020-08-30 – 2020-08-31 (×3): 5 mg via ORAL
  Filled 2020-08-29 (×4): qty 1

## 2020-08-29 MED ORDER — METOPROLOL SUCCINATE ER 50 MG PO TB24
50.0000 mg | ORAL_TABLET | Freq: Two times a day (BID) | ORAL | Status: DC
  Administered 2020-08-30: 50 mg via ORAL
  Filled 2020-08-29: qty 1

## 2020-08-29 MED ORDER — CALCIUM CARBONATE-VITAMIN D 500-200 MG-UNIT PO TABS
1.0000 | ORAL_TABLET | Freq: Every day | ORAL | Status: DC
  Administered 2020-08-30 – 2020-09-01 (×3): 1 via ORAL
  Filled 2020-08-29 (×3): qty 1

## 2020-08-29 MED ORDER — POTASSIUM CHLORIDE 20 MEQ PO PACK
40.0000 meq | PACK | Freq: Once | ORAL | Status: AC
  Administered 2020-08-29: 40 meq via ORAL
  Filled 2020-08-29: qty 2

## 2020-08-29 MED ORDER — FLUOXETINE HCL 20 MG PO CAPS
20.0000 mg | ORAL_CAPSULE | Freq: Every day | ORAL | Status: DC
  Administered 2020-08-30 – 2020-09-01 (×3): 20 mg via ORAL
  Filled 2020-08-29 (×3): qty 1

## 2020-08-29 MED ORDER — METOPROLOL SUCCINATE ER 50 MG PO TB24: 50.0000 mg | ORAL_TABLET | Freq: Every day | ORAL | Status: DC

## 2020-08-29 MED ORDER — PRAVASTATIN SODIUM 20 MG PO TABS
20.0000 mg | ORAL_TABLET | Freq: Every day | ORAL | Status: DC
  Administered 2020-08-30 – 2020-08-31 (×2): 20 mg via ORAL
  Filled 2020-08-29 (×3): qty 1

## 2020-08-29 MED ORDER — TRAMADOL HCL 50 MG PO TABS: 50.0000 mg | ORAL_TABLET | Freq: Four times a day (QID) | ORAL | Status: DC | PRN

## 2020-08-29 MED ORDER — AMIODARONE HCL 200 MG PO TABS
200.0000 mg | ORAL_TABLET | Freq: Two times a day (BID) | ORAL | Status: DC
  Filled 2020-08-29: qty 1

## 2020-08-29 NOTE — ED Notes (Signed)
Pt found by ED staff standing in doorway calmly with IV removed and blood noted on gown. Pt had stool on legs and reported to staff she had to have a bowel movement. Pt was easy to redirect back to the bed and steady on her feet enough to stand while staff cleaned her legs and arms. New gown applied, bed sheets changed and new IV placed. Pt back in bed in NAD at this time.

## 2020-08-29 NOTE — H&P (Addendum)
[place]   PATIENT NAME: Jody [name]    MR#:  [id number]  DATE OF BIRTH:  1940/10/06  DATE OF ADMISSION:  08/29/2020  PRIMARY CARE PHYSICIAN: Baxter Hire, MD   Patient is coming from: Home  REQUESTING/REFERRING PHYSICIAN: Harvest Dark, MD CHIEF COMPLAINT:   Chief Complaint  Patient presents with  . Irregular Heart Beat  . Fatigue    HISTORY OF PRESENT ILLNESS:  Jody [name] is a 80 y.o. Caucasian female with medical history significant for atrial fibrillation on Eliquis, CHF, hypertension, GERD, dementia and right lung cancer status post radiotherapy, who presented to the ER with acute onset of worsening dyspnea with generalized weakness and fatigue for a couple of days.  She was noted to be in atrial fibrillation with rapid ventricular response by EMS with a rate in the 160s.  She was given 50 mg of IV Cardizem en route to the ER.  She admitted to palpitations without chest pain and has been having mild orthopnea with her dyspnea over the last week as well as dyspnea on exertion.  She denied any nausea or vomiting or abdominal pain.  She denied any paroxysmal nocturnal dyspnea.  No fever or chills.  No dysuria, oliguria or hematuria or flank pain.  No headache or dizziness or blurred vision or paresthesias or focal muscle weakness.  No bleeding diathesis.  ED Course: Upon presentation to the ER, blood pressure was 138/99 with heart rate 123 and respiratory rate of 35 with a temperature of 97.9 pulse currently 93% on room air and later heart rate was 134.  Labs revealed hyponatremia of 128 and hypochloremia of 97, blood glucose 175 and potassium of 3.8 total bili 1.5.  BNP came back 1896 and high-sensitivity troponin VII.  CBC showed WBC of 10.9 and was otherwise unremarkable.  Influenza antigens and COVID-19 PCR came back negative.  EKG as reviewed by me : Showed atrial fibrillation with rapid ventricle response of 130 with right axis deviation and T wave inversion  in anterolateral and inferior leads. Imaging: Portable chest x-ray showed congestive heart failure with pulmonary venous hypertension bilateral pleural effusions, moderate to large on the right and small on the left.  The patient was given 40 mg of IV Lasix and was placed on IV Cardizem drip which has been titrated with improvement of her heart rate so far to 117.  She will be admitted to the progressive cardiac unit bed for further evaluation and management. PAST MEDICAL HISTORY:   Past Medical History:  Diagnosis Date  . Asthma   . Atrial fibrillation ([place])   . CHF (congestive heart failure) ([place])   . 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 Dysrhythmia   . GERD (gastroesophageal reflux disease)   . Hypertension   . Memory difficulties   . Primary adenocarcinoma of right lung ([place]) 02/2005   Rad tx's.   . Uses walker     PAST SURGICAL HISTORY:   Past Surgical History:  Procedure Laterality Date  . CATARACT EXTRACTION W/PHACO Left 04/29/2020   Procedure: CATARACT EXTRACTION PHACO AND INTRAOCULAR LENS PLACEMENT (IOC) LEFT 17.47 01:28.8;  Surgeon: Birder Robson, MD;  Location: [place];  Service: Ophthalmology;  Laterality: Left;  . CATARACT EXTRACTION W/PHACO Right 05/20/2020   Procedure: CATARACT EXTRACTION PHACO AND INTRAOCULAR LENS PLACEMENT (IOC) RIGHT 14.89 01:13.8;  Surgeon: Birder Robson, MD;  Location: [place];  Service: Ophthalmology;  Laterality: Right;  . COLOSTOMY  N/A 01/05/2018   Procedure: COLOSTOMY;  Surgeon: Benjamine Sprague, DO;  Location: [facility];  Service: General;  Laterality: N/A;  . COLOSTOMY TAKEDOWN N/A 11/28/2018   Procedure: COLOSTOMY TAKEDOWN, DIABETIC;  Surgeon: Benjamine Sprague, DO;  Location: [facility];  Service: General;  Laterality: N/A;  . LAPAROTOMY N/A 01/05/2018   Procedure: EXPLORATORY LAPAROTOMY/ POSSIBLE BOWEL OBSTRUCTION;  Surgeon: Benjamine Sprague, DO;  Location: [facility];  Service: General;  Laterality:  N/A;  . LYSIS OF ADHESION N/A 01/05/2018   Procedure: LYSIS OF ADHESION;  Surgeon: Benjamine Sprague, DO;  Location: [facility];  Service: General;  Laterality: N/A;    SOCIAL HISTORY:   Social History   Tobacco Use  . Smoking status: Never Smoker  . Smokeless tobacco: Never Used  Substance Use Topics  . Alcohol use: No    FAMILY HISTORY:   Family History  Problem Relation Age of Onset  . Breast cancer Paternal Aunt     DRUG ALLERGIES:   Allergies  Allergen Reactions  . Donepezil     Loss of appetite  . Poison Oak Extract Other (See Comments)    blistering  . Ampicillin Rash  . Penicillin V Potassium Rash    Has patient had a PCN reaction causing immediate rash, facial/tongue/throat swelling, SOB or lightheadedness with hypotension: No Has patient had a PCN reaction causing severe rash involving mucus membranes or skin necrosis: No Has patient had a PCN reaction that required hospitalization: No Has patient had a PCN reaction occurring within the last 10 years: No If all of the above answers are "NO", then may proceed with Cephalosporin use.     REVIEW OF SYSTEMS:   ROS As per history of present illness. All pertinent systems were reviewed above. Constitutional, HEENT, cardiovascular, respiratory, GI, GU, musculoskeletal, neuro, psychiatric, endocrine, integumentary and hematologic systems were reviewed and are otherwise negative/unremarkable except for positive findings mentioned above in the HPI.   MEDICATIONS AT HOME:   Prior to Admission medications   Medication Sig Start Date End Date Taking? Authorizing Provider  acetaminophen (TYLENOL) 325 MG tablet Take 2 tablets (650 mg total) by mouth every 6 (six) hours as needed (fever > 101). 12/03/18   Olean Ree, MD  albuterol (PROVENTIL) (2.5 MG/3ML) 0.083% nebulizer solution Take 1.25 mg by nebulization every 6 (six) hours as needed for wheezing or shortness of breath.     [provider]  albuterol (VENTOLIN  HFA) 108 (90 Base) MCG/ACT inhaler Inhale 2 puffs into the lungs every 6 (six) hours as needed for wheezing or shortness of breath.     [provider]  alendronate (FOSAMAX) 70 MG tablet Take 70 mg by mouth once a week.     [provider]  ALPRAZolam Duanne Moron) 0.5 MG tablet Take 0.5 mg by mouth at bedtime.    [provider]  amiodarone (PACERONE) 200 MG tablet Take 1 tablet (200 mg total) by mouth 2 (two) times daily. Patient not taking: Reported on 11/21/2018 01/22/18   Gladstone Lighter, MD  apixaban (ELIQUIS) 5 MG TABS tablet Take 5 mg by mouth 2 (two) times daily.     [provider]  Calcium Carbonate-Vitamin D 600-200 MG-UNIT TABS Take 1 tablet by mouth daily.    [provider]  digoxin (LANOXIN) 0.125 MG tablet Take 1 tablet (0.125 mg total) by mouth daily. 01/22/18   Gladstone Lighter, MD  diltiazem (DILACOR XR) 240 MG 24 hr capsule Take 240 mg by mouth daily.    [provider]  FLUoxetine (PROZAC) 20 MG capsule Take 20 mg by mouth daily.  02/20/14   [provider]  furosemide (LASIX) 20 MG tablet Take 20 mg by mouth daily.    [provider]  lovastatin (MEVACOR) 20 MG tablet Take 20 mg by mouth daily with supper.     [provider]  metoprolol succinate (TOPROL XL) 50 MG 24 hr tablet Take 1 tablet (50 mg total) by mouth daily. Take with or immediately following a meal. Patient taking differently: Take 50 mg by mouth 2 (two) times a day. 02/10/15 05/12/20  Daymon Larsen, MD  montelukast (SINGULAIR) 10 MG tablet Take 10 mg by mouth at bedtime.  08/23/14   [provider]  Multiple Vitamin (MULTI-VITAMINS) TABS Take 1 tablet by mouth daily.     [provider]  omeprazole (PRILOSEC) 20 MG capsule Take 20 mg by mouth in the morning and at bedtime.  08/15/14   [provider]  ondansetron (ZOFRAN) 4 MG tablet Take 1 tablet (4 mg total) by mouth daily as needed. Patient taking  differently: Take 4 mg by mouth daily as needed for nausea or vomiting.  03/30/18   Schaevitz, Randall An, MD  potassium chloride (K-DUR) 10 MEQ tablet Take 10 mEq by mouth daily. Patient not taking: Reported on 04/23/2020    [provider]  traMADol (ULTRAM) 50 MG tablet Take 50 mg by mouth every 6 (six) hours as needed for moderate pain.    [provider]  traZODone (DESYREL) 50 MG tablet Take 50 mg by mouth at bedtime. 02/17/18 05/20/20  [provider]      VITAL SIGNS:  Blood pressure 114/87, pulse (!) 117, temperature 97.9 F (36.6 C), temperature source Oral, resp. rate (!) 35, height 4\' 11"  (1.499 m), weight 63.5 kg, SpO2 96 %.  PHYSICAL EXAMINATION:  Physical Exam  GENERAL:  80 y.o.-year-old Caucasian female patient lying in the bed with no acute distress with mild conversational dyspnea.Marland Kitchen  EYES: Pupils equal, round, reactive to light and accommodation. No scleral icterus. Extraocular muscles intact.  HEENT: Head atraumatic, normocephalic. Oropharynx and nasopharynx clear.  NECK:  Supple, no jugular venous distention. No thyroid enlargement, no tenderness.  LUNGS: Diminished bibasal and right midlung zone breath sounds with bibasal rales. CARDIOVASCULAR: Irregularly irregular tachycardic rhythm, S1, S2 normal. No murmurs, rubs, or gallops.  ABDOMEN: Soft, nondistended, nontender. Bowel sounds present. No organomegaly or mass.  EXTREMITIES: No pedal edema, cyanosis, or clubbing.  NEUROLOGIC: Cranial nerves II through XII are intact. Muscle strength 5/5 in all extremities. Sensation intact. Gait not checked.  PSYCHIATRIC: The patient is alert and oriented x 3.  Normal affect and good eye contact. SKIN: No obvious rash, lesion, or ulcer.   LABORATORY PANEL:   CBC Recent Labs  Lab 08/29/20 1742  WBC 10.9*  HGB 12.5  HCT 36.5  PLT 320    ------------------------------------------------------------------------------------------------------------------  Chemistries  Recent Labs  Lab 08/29/20 1742  NA 128*  K 3.8  CL 97*  CO2 21*  GLUCOSE 175*  BUN 17  CREATININE 0.92  CALCIUM 9.1  AST 20  ALT 17  ALKPHOS 45  BILITOT 1.5*   ------------------------------------------------------------------------------------------------------------------  Cardiac Enzymes No results for input(s): TROPONINI in the last 168 hours. ------------------------------------------------------------------------------------------------------------------  RADIOLOGY:  DG Chest Portable 1 View  Result Date: 08/29/2020 CLINICAL DATA:  Shortness of breath and weakness. Atrial fibrillation. EXAM: PORTABLE CHEST 1 VIEW COMPARISON:  01/17/2018 FINDINGS: Cardiomegaly. Aortic atherosclerosis. Bilateral pleural effusions, moderate to large on the  right and small on the left. Compressive atelectasis of the right lower lung. Pulmonary venous hypertension but without frank edema. Findings consistent with congestive heart failure. IMPRESSION: Congestive heart failure with pulmonary venous hypertension and bilateral pleural effusions, moderate to large on the right and small on the left. Electronically Signed   By: Nelson Chimes M.D.   On: 08/29/2020 17:54      IMPRESSION AND PLAN:  Active Problems:   Atrial fibrillation with rapid ventricular response ([place])  1.  Paroxysmal atrial fibrillation with rapid ventricular response. -The patient's CHA2DS2-VASc score is 5. -She will be admitted to a progressive unit bed. -We will continue her p.o. Eliquis. -We will resume her p.o. amiodarone and Lanoxin. -We will check TSH level. -We will continue her IV Cardizem drip to replace Cardizem CD at this time. -We will optimize her potassium and check magnesium level. -2D echo and a cardiology consult will be obtained. -I notified Dr. Clayborn Bigness about the  patient.  2.  Acute on chronic likely diastolic CHF with bilateral pleural effusions, right more than left. -The patient's last 2D echo was on 11/27/2018 and revealed an EF of 50 to 55% with moderate left atrial dilatation and moderate tricuspid regurgitation as well as moderate mitral regurgitation -The patient will be placed on diuresis with IV Lasix. -2D echo and cardiology consult will be obtained as mentioned above. -At this time we will monitor pleural effusions with diuresis as the patient is not hypoxic.  I do not think urgent thoracentesis especially on the right is currently needed but may be in the future.  3.   Dyslipidemia. -Statin therapy will be resumed.  4.  Essential hypertension. -We will continue her Toprol-XL.  5.  Dementia. -We will continue her Namenda.  6.  GERD. -PPI therapy will be resumed.  7. Depression. -We will continue Prozac.  DVT prophylaxis: We will continue Eliquis. Code Status: full code. Family Communication:  The plan of care was discussed in details with the patient (and family). I answered all questions. The patient agreed to proceed with the above mentioned plan. Further management will depend upon hospital course. Disposition Plan: Back to previous home environment Consults called: Cardiology consult as mentioned above.  All the records are reviewed and case discussed with ED provider.  Status is: Inpatient  Remains inpatient appropriate because:Hemodynamically unstable, Ongoing diagnostic testing needed not appropriate for outpatient work up, Unsafe d/c plan, IV treatments appropriate due to intensity of illness or inability to take PO and Inpatient level of care appropriate due to severity of illness   Dispo: The patient is from: Home              Anticipated d/c is to: Home              Patient currently is not medically stable to d/c.   Difficult to place patient No   TOTAL TIME TAKING CARE OF THIS PATIENT: 55 minutes.    Christel Mormon M.D on 08/29/2020 at 7:44 PM  [facility]   From 7 PM-7 AM, contact night-coverage [email or web address]  CC: Primary care physician; Baxter Hire, MD

## 2020-08-29 NOTE — Progress Notes (Signed)
PHARMACIST - PHYSICIAN COMMUNICATION  CONCERNING: P&T Medication Policy Regarding Oral Bisphosphonates  RECOMMENDATION: Your order for alendronate (Fosamax), ibandronate (Boniva), or risedronate (Actonel) has been discontinued at this time.  If the patient's post-hospital medical condition warrants safe use of this class of drugs, please resume the pre-hospital regimen upon discharge.  DESCRIPTION:  Alendronate (Fosamax), ibandronate (Boniva), and risedronate (Actonel) can cause severe esophageal erosions in patients who are unable to remain upright at least 30 minutes after taking this medication.   Since brief interruptions in therapy are thought to have minimal impact on bone mineral density, the [facility] has established that bisphosphonate orders should be routinely discontinued during hospitalization.   To override this safety policy and permit administration of Boniva, Fosamax, or Actonel in the hospital, prescribers must write "DO NOT HOLD" in the comments section when placing the order for this class of medications.   Renda Rolls, PharmD, [facility] 08/29/2020 11:53 PM

## 2020-08-29 NOTE — ED Triage Notes (Signed)
Pt presents to the [facility] via EMS from home with c/o fatigue and afib RVR. EMS states that they initially called for fatigue that has been ongoing for several days. Pt was found to be in afib RVR with rate in the 160's. Pt was given 15mg  cardizem en route with EMS.

## 2020-08-29 NOTE — ED Provider Notes (Signed)
[facility] Emergency Department Provider Note  Time seen: 5:41 PM  I have reviewed the triage vital signs and the nursing notes.   HISTORY  Chief Complaint Weakness shortness of breath  HPI Jody [name] is a 79 y.o. female with a past medical history of asthma, atrial fibrillation, CHF, gastric reflux, hypertension, dementia, presents to the emergency department for generalized weakness and shortness of breath.  According to EMS husband called EMS today for 2 days of generalized fatigue weakness and noted to look short of breath today.  No reported fever cough congestion vomiting or diarrhea.  Largely negative review of systems however strongly limited due to history of dementia.   Past Medical History:  Diagnosis Date  . Asthma   . Atrial fibrillation ([place])   . CHF (congestive heart failure) ([place])   . 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 Dysrhythmia   . GERD (gastroesophageal reflux disease)   . Hypertension   . Memory difficulties   . Primary adenocarcinoma of right lung ([place]) 02/2005   Rad tx's.   . Uses walker     Patient Active Problem List   Diagnosis Date Noted  . Chronic a-fib ([place]) 11/28/2018  . History of colostomy reversal 11/28/2018  . Perforation bowel ([place])   . Poor appetite   . Acute delirium   . Goals of care, counseling/discussion   . Palliative care encounter   . Diverticulitis of colon with perforation 12/27/2017  . Atrial fibrillation with RVR ([place]) 12/27/2017  . Malignant neoplasm of upper lobe of right lung ([place]) 12/31/2015  . 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 TI (tricuspid incompetence) 01/21/2015  . Anxiety 01/13/2015  . Clinical depression 01/13/2015  . 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 Borderline diabetes mellitus 01/13/2015  . Benign essential HTN 01/06/2015  . Essential (primary) hypertension 01/06/2015  . Atrial fibrillation, chronic ([place]) 05/20/2014  . Combined fat and carbohydrate  induced hyperlipemia 05/20/2014  . Chronic atrial fibrillation ([place]) 05/20/2014  . 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 Cough 11/20/2013  . Breath shortness 11/01/2013  . Breathlessness on exertion 10/30/2013    Past Surgical History:  Procedure Laterality Date  . CATARACT EXTRACTION W/PHACO Left 04/29/2020   Procedure: CATARACT EXTRACTION PHACO AND INTRAOCULAR LENS PLACEMENT (IOC) LEFT 17.47 01:28.8;  Surgeon: Birder Robson, MD;  Location: [place];  Service: Ophthalmology;  Laterality: Left;  . CATARACT EXTRACTION W/PHACO Right 05/20/2020   Procedure: CATARACT EXTRACTION PHACO AND INTRAOCULAR LENS PLACEMENT (IOC) RIGHT 14.89 01:13.8;  Surgeon: Birder Robson, MD;  Location: [place];  Service: Ophthalmology;  Laterality: Right;  . COLOSTOMY N/A 01/05/2018   Procedure: COLOSTOMY;  Surgeon: Benjamine Sprague, DO;  Location: [facility];  Service: General;  Laterality: N/A;  . COLOSTOMY TAKEDOWN N/A 11/28/2018   Procedure: COLOSTOMY TAKEDOWN, DIABETIC;  Surgeon: Benjamine Sprague, DO;  Location: [facility];  Service: General;  Laterality: N/A;  . LAPAROTOMY N/A 01/05/2018   Procedure: EXPLORATORY LAPAROTOMY/ POSSIBLE BOWEL OBSTRUCTION;  Surgeon: Benjamine Sprague, DO;  Location: [facility];  Service: General;  Laterality: N/A;  . LYSIS OF ADHESION N/A 01/05/2018   Procedure: LYSIS OF ADHESION;  Surgeon: Benjamine Sprague, DO;  Location: [facility];  Service: General;  Laterality: N/A;    Prior to Admission medications   Medication Sig Start Date End Date Taking? Authorizing Provider  acetaminophen (TYLENOL) 325 MG tablet Take 2 tablets (650 mg total) by mouth every 6 (six) hours as needed (fever > 101). 12/03/18  Olean Ree, MD  albuterol (PROVENTIL) (2.5 MG/3ML) 0.083% nebulizer solution Take 1.25 mg by nebulization every 6 (six) hours as needed for wheezing or shortness of breath.     [provider]  albuterol (VENTOLIN HFA) 108 (90 Base) MCG/ACT inhaler Inhale 2 puffs into the  lungs every 6 (six) hours as needed for wheezing or shortness of breath.     [provider]  alendronate (FOSAMAX) 70 MG tablet Take 70 mg by mouth once a week.     [provider]  ALPRAZolam Duanne Moron) 0.5 MG tablet Take 0.5 mg by mouth at bedtime.    [provider]  amiodarone (PACERONE) 200 MG tablet Take 1 tablet (200 mg total) by mouth 2 (two) times daily. Patient not taking: Reported on 11/21/2018 01/22/18   Gladstone Lighter, MD  apixaban (ELIQUIS) 5 MG TABS tablet Take 5 mg by mouth 2 (two) times daily.     [provider]  Calcium Carbonate-Vitamin D 600-200 MG-UNIT TABS Take 1 tablet by mouth daily.    [provider]  digoxin (LANOXIN) 0.125 MG tablet Take 1 tablet (0.125 mg total) by mouth daily. 01/22/18   Gladstone Lighter, MD  diltiazem (DILACOR XR) 240 MG 24 hr capsule Take 240 mg by mouth daily.    [provider]  FLUoxetine (PROZAC) 20 MG capsule Take 20 mg by mouth daily.  02/20/14   [provider]  furosemide (LASIX) 20 MG tablet Take 20 mg by mouth daily.    [provider]  lovastatin (MEVACOR) 20 MG tablet Take 20 mg by mouth daily with supper.     [provider]  metoprolol succinate (TOPROL XL) 50 MG 24 hr tablet Take 1 tablet (50 mg total) by mouth daily. Take with or immediately following a meal. Patient taking differently: Take 50 mg by mouth 2 (two) times a day. 02/10/15 05/12/20  Daymon Larsen, MD  montelukast (SINGULAIR) 10 MG tablet Take 10 mg by mouth at bedtime.  08/23/14   [provider]  Multiple Vitamin (MULTI-VITAMINS) TABS Take 1 tablet by mouth daily.     [provider]  omeprazole (PRILOSEC) 20 MG capsule Take 20 mg by mouth in the morning and at bedtime.  08/15/14   [provider]  ondansetron (ZOFRAN) 4 MG tablet Take 1 tablet (4 mg total) by mouth daily as needed. Patient taking differently: Take 4 mg by mouth daily as needed for nausea or  vomiting.  03/30/18   Schaevitz, Randall An, MD  potassium chloride (K-DUR) 10 MEQ tablet Take 10 mEq by mouth daily. Patient not taking: Reported on 04/23/2020    [provider]  traMADol (ULTRAM) 50 MG tablet Take 50 mg by mouth every 6 (six) hours as needed for moderate pain.    [provider]  traZODone (DESYREL) 50 MG tablet Take 50 mg by mouth at bedtime. 02/17/18 05/20/20  [provider]    Allergies  Allergen Reactions  . Donepezil     Loss of appetite  . Poison Oak Extract Other (See Comments)    blistering  . Ampicillin Rash  . Penicillin V Potassium Rash    Has patient had a PCN reaction causing immediate rash, facial/tongue/throat swelling, SOB or lightheadedness with hypotension: No Has patient had a PCN reaction causing severe rash involving mucus membranes or skin necrosis: No Has patient had a PCN reaction that required hospitalization: No Has patient had a PCN reaction occurring within the last 10 years: No If  all of the above answers are "NO", then may proceed with Cephalosporin use.     Family History  Problem Relation Age of Onset  . Breast cancer Paternal Aunt     Social History Social History   Tobacco Use  . Smoking status: Never Smoker  . Smokeless tobacco: Never Used  Vaping Use  . Vaping Use: Never used  Substance Use Topics  . Alcohol use: No  . Drug use: Never    Review of Systems Unable to obtain adequate/accurate review of systems secondary to baseline dementia. ____________________________________________   PHYSICAL EXAM:  Constitutional: Patient is awake and alert, lying in bed, no acute distress. Eyes: Normal exam ENT      Head: Normocephalic and atraumatic.      Mouth/Throat: Mucous membranes are moist. Cardiovascular: Irregular rhythm rate around 150 bpm. Respiratory: Normal respiratory effort without tachypnea nor retractions. Breath sounds are clear without wheeze rales or  rhonchi. Gastrointestinal: Soft and nontender. No distention.  No reaction to palpation. Musculoskeletal: Nontender with normal range of motion in all extremities.  Neurologic:  Normal speech and language. No gross focal neurologic deficits Skin:  Skin is warm, dry and intact.  Psychiatric: Mood and affect are normal.   ____________________________________________    EKG  EKG viewed and interpreted by myself shows atrial fibrillation with rapid ventricular response at 130 bpm with a narrow QRS, normal axis, normal intervals, nonspecific ST changes.  ____________________________________________    RADIOLOGY  Consistent with CHF  ____________________________________________   INITIAL IMPRESSION / ASSESSMENT AND PLAN / ED COURSE  Pertinent labs & imaging results that were available during my care of the patient were reviewed by me and considered in my medical decision making (see chart for details).   Patient presents emergency department for generalized weakness fatigue and shortness of breath over the past 2 days.  Patient lives at home with husband, has dementia and cannot contribute much to her history.  Patient found to be in atrial fibrillation with rapid ventricular response by EMS around 150 bpm.  Patient was given diltiazem by EMS with some effect currently around 130 bpm.  Given the continued A. fib with RVR we will place the patient on a diltiazem infusion.  We will check labs, chest x-ray, Covid swab and continue to closely monitor.  Differential would include infectious etiology, atrial fibrillation with rapid ventricular response, ACS, pneumonia or pneumothorax.  X-ray consistent with congestive heart failure.  Given atrial fibrillation with rapid ventricular sponsor patient on diltiazem infusion.  We will dose Lasix for CHF.  Patient will need admission to the hospital service for further treatment.  Jody [name] was evaluated in Emergency Department on 08/29/2020 for the  symptoms described in the history of present illness. She was evaluated in the context of the global COVID-19 pandemic, which necessitated consideration that the patient might be at risk for infection with the SARS-CoV-2 virus that causes COVID-19. Institutional protocols and algorithms that pertain to the evaluation of patients at risk for COVID-19 are in a state of rapid change based on information released by regulatory bodies including the CDC and federal and state organizations. These policies and algorithms were followed during the patient's care in the ED.  CRITICAL CARE Performed by: Harvest Dark   Total critical care time: 30 minutes  Critical care time was exclusive of separately billable procedures and treating other patients.  Critical care was necessary to treat or prevent imminent or life-threatening deterioration.  Critical care was time spent personally by  me on the following activities: development of treatment plan with patient and/or surrogate as well as nursing, discussions with consultants, evaluation of patient's response to treatment, examination of patient, obtaining history from patient or surrogate, ordering and performing treatments and interventions, ordering and review of laboratory studies, ordering and review of radiographic studies, pulse oximetry and re-evaluation of patient's condition.   ____________________________________________   FINAL CLINICAL IMPRESSION(S) / ED DIAGNOSES  Dyspnea Atrial fibrillation with rapid ventricular response CHF exacerbation   Harvest Dark, MD 08/29/20 2259

## 2020-08-30 ENCOUNTER — Encounter: Payer: Self-pay | Admitting: Family Medicine

## 2020-08-30 DIAGNOSIS — G309 Alzheimer's disease, unspecified: Secondary | ICD-10-CM

## 2020-08-30 DIAGNOSIS — F0280 Dementia in other diseases classified elsewhere without behavioral disturbance: Secondary | ICD-10-CM

## 2020-08-30 DIAGNOSIS — F0390 Unspecified dementia without behavioral disturbance: Secondary | ICD-10-CM

## 2020-08-30 DIAGNOSIS — J90 Pleural effusion, not elsewhere classified: Secondary | ICD-10-CM

## 2020-08-30 DIAGNOSIS — I509 Heart failure, unspecified: Secondary | ICD-10-CM

## 2020-08-30 LAB — BASIC METABOLIC PANEL
Anion gap: 10 (ref 5–15)
BUN: 16 mg/dL (ref 8–23)
CO2: 23 mmol/L (ref 22–32)
Calcium: 8.9 mg/dL (ref 8.9–10.3)
Chloride: 97 mmol/L — ABNORMAL LOW (ref 98–111)
Creatinine, Ser: 0.81 mg/dL (ref 0.44–1.00)
GFR, Estimated: >60 mL/min (ref >60)
Glucose, Bld: 117 mg/dL — ABNORMAL HIGH (ref 70–99)
Potassium: 4.3 mmol/L (ref 3.5–5.1)
Sodium: 130 mmol/L — ABNORMAL LOW (ref 135–145)

## 2020-08-30 LAB — CBC
HCT: 34.8 % — ABNORMAL LOW (ref 36.0–46.0)
Hemoglobin: 11.6 g/dL — ABNORMAL LOW (ref 12.0–15.0)
MCH: 27.7 pg (ref 26.0–34.0)
MCHC: 33.3 g/dL (ref 30.0–36.0)
MCV: 83.1 fL (ref 80.0–100.0)
Platelets: 256 10*3/uL (ref 150–400)
RBC: 4.19 MIL/uL (ref 3.87–5.11)
RDW: 15.7 % — ABNORMAL HIGH (ref 11.5–15.5)
WBC: 8.6 10*3/uL (ref 4.0–10.5)
nRBC: 0 % (ref 0.0–0.2)

## 2020-08-30 MED ORDER — DIGOXIN 0.25 MG/ML IJ SOLN
0.5000 mg | Freq: Once | INTRAMUSCULAR | Status: AC
  Administered 2020-08-30: 0.5 mg via INTRAVENOUS
  Filled 2020-08-30: qty 2

## 2020-08-30 MED ORDER — DILTIAZEM HCL 30 MG PO TABS
30.0000 mg | ORAL_TABLET | Freq: Four times a day (QID) | ORAL | Status: DC | PRN
  Administered 2020-08-30 – 2020-08-31 (×2): 30 mg via ORAL
  Filled 2020-08-30 (×2): qty 1

## 2020-08-30 MED ORDER — METOPROLOL SUCCINATE ER 100 MG PO TB24
100.0000 mg | ORAL_TABLET | Freq: Two times a day (BID) | ORAL | Status: DC
  Administered 2020-08-30 – 2020-08-31 (×3): 100 mg via ORAL
  Filled 2020-08-30 (×4): qty 1

## 2020-08-30 MED ORDER — DIGOXIN 0.25 MG/ML IJ SOLN
0.2500 mg | Freq: Once | INTRAMUSCULAR | Status: AC
  Administered 2020-08-30: 0.25 mg via INTRAVENOUS
  Filled 2020-08-30: qty 2

## 2020-08-30 MED ORDER — METOPROLOL TARTRATE 5 MG/5ML IV SOLN
2.5000 mg | INTRAVENOUS | Status: DC | PRN
  Administered 2020-08-30 – 2020-08-31 (×2): 2.5 mg via INTRAVENOUS
  Filled 2020-08-30 (×2): qty 5

## 2020-08-30 NOTE — Consult Note (Signed)
CARDIOLOGY CONSULT NOTE               Patient ID: Jody [name] MRN: [id number] DOB/AGE: 1941/01/01 80 y.o.  Admit date: 08/29/2020 Referring Physician Dr. Garner Gavel hospitalist Primary Physician [name] primary Primary Cardiologist Dr. Ubaldo Glassing Reason for Consultation rapid atrial fibrillation congestive heart failure shortness of breath  HPI: Patient 80 year old female history of chronic atrial fibrillation on Eliquis congestive heart failure hypertension GERD dementia right lung cancer status post radiation therapy presented with worsening dyspnea shortness of breath generalized weakness and fatigue over the last several days she was found to be in rapid atrial fibrillation rates above 150 she was given IV Cardizem which helped her rate patient denies any chest pain but has had palpitations no significant leg edema no nausea vomiting or diarrhea no headaches no back as per the syncope.  Patient was treated with supplemental oxygen diuretics and diltiazem to help with EKG was nondiagnostic with atrial fibrillation nonspecific ST-T wave changes she was Covid negative and given IV diuretic therapy  Review of systems complete and found to be negative unless listed above     Past Medical History:  Diagnosis Date  . Asthma   . Atrial fibrillation ([place])   . CHF (congestive heart failure) ([place])   . 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 Dysrhythmia   . GERD (gastroesophageal reflux disease)   . Hypertension   . Memory difficulties   . Primary adenocarcinoma of right lung ([place]) 02/2005   Rad tx's.   . Uses walker     Past Surgical History:  Procedure Laterality Date  . CATARACT EXTRACTION W/PHACO Left 04/29/2020   Procedure: CATARACT EXTRACTION PHACO AND INTRAOCULAR LENS PLACEMENT (IOC) LEFT 17.47 01:28.8;  Surgeon: Birder Robson, MD;  Location: [facility];  Service: Ophthalmology;  Laterality: Left;  . CATARACT EXTRACTION W/PHACO Right  05/20/2020   Procedure: CATARACT EXTRACTION PHACO AND INTRAOCULAR LENS PLACEMENT (IOC) RIGHT 14.89 01:13.8;  Surgeon: Birder Robson, MD;  Location: [facility];  Service: Ophthalmology;  Laterality: Right;  . COLOSTOMY N/A 01/05/2018   Procedure: COLOSTOMY;  Surgeon: Benjamine Sprague, DO;  Location: [facility];  Service: General;  Laterality: N/A;  . COLOSTOMY TAKEDOWN N/A 11/28/2018   Procedure: COLOSTOMY TAKEDOWN, DIABETIC;  Surgeon: Benjamine Sprague, DO;  Location: [facility];  Service: General;  Laterality: N/A;  . LAPAROTOMY N/A 01/05/2018   Procedure: EXPLORATORY LAPAROTOMY/ POSSIBLE BOWEL OBSTRUCTION;  Surgeon: Benjamine Sprague, DO;  Location: [facility];  Service: General;  Laterality: N/A;  . LYSIS OF ADHESION N/A 01/05/2018   Procedure: LYSIS OF ADHESION;  Surgeon: Benjamine Sprague, DO;  Location: [facility];  Service: General;  Laterality: N/A;    Medications Prior to Admission  Medication Sig Dispense Refill Last Dose  . acetaminophen (TYLENOL) 325 MG tablet Take 2 tablets (650 mg total) by mouth every 6 (six) hours as needed (fever > 101).   prn at prn  . albuterol (PROVENTIL) (2.5 MG/3ML) 0.083% nebulizer solution Take 1.25 mg by nebulization every 6 (six) hours as needed for wheezing or shortness of breath.    prn at prn  . albuterol (VENTOLIN HFA) 108 (90 Base) MCG/ACT inhaler Inhale 2 puffs into the lungs every 6 (six) hours as needed for wheezing or shortness of breath.    prn at prn  . alendronate (FOSAMAX) 70 MG tablet Take 70 mg by mouth once a week.    Past Week at Unknown time  . ALPRAZolam (  XANAX) 0.5 MG tablet Take 0.5 mg by mouth 2 (two) times daily as needed.   prn at prn  . apixaban (ELIQUIS) 5 MG TABS tablet Take 5 mg by mouth 2 (two) times daily.    Past Week at Unknown time  . Calcium Carbonate-Vitamin D 600-200 MG-UNIT TABS Take 1 tablet by mouth daily.   Past Week at Unknown time  . digoxin (LANOXIN) 0.125 MG tablet Take 1 tablet (0.125 mg total) by mouth daily. 30 tablet 0 Past  Week at Unknown time  . diltiazem (CARDIZEM CD) 120 MG 24 hr capsule Take 120 mg by mouth 2 (two) times daily.   Past Week at Unknown time  . FLUoxetine (PROZAC) 20 MG capsule Take 20 mg by mouth daily.    Past Week at Unknown time  . furosemide (LASIX) 20 MG tablet Take 20 mg by mouth daily.   Past Week at Unknown time  . lovastatin (MEVACOR) 20 MG tablet Take 20 mg by mouth daily with supper.    Past Week at Unknown time  . megestrol (MEGACE ES) 625 MG/5ML suspension Take 625 mg by mouth daily.   Past Week at Unknown time  . memantine (NAMENDA) 5 MG tablet Take 5 mg by mouth 2 (two) times daily.   Past Week at Unknown time  . metoprolol succinate (TOPROL XL) 50 MG 24 hr tablet Take 1 tablet (50 mg total) by mouth daily. Take with or immediately following a meal. (Patient taking differently: Take 50 mg by mouth 2 (two) times a day.) 30 tablet 11 Past Week at Unknown time  . Multiple Vitamin (MULTI-VITAMINS) TABS Take 1 tablet by mouth daily.    Past Week at Unknown time  . traMADol (ULTRAM) 50 MG tablet Take 50 mg by mouth every 6 (six) hours as needed for moderate pain.   prn at prn  . traZODone (DESYREL) 50 MG tablet Take 50 mg by mouth at bedtime.   prn at prn  . amiodarone (PACERONE) 200 MG tablet Take 1 tablet (200 mg total) by mouth 2 (two) times daily. (Patient not taking: No sig reported) 60 tablet 1 Not Taking at Unknown time  . montelukast (SINGULAIR) 10 MG tablet Take 10 mg by mouth at bedtime.  (Patient not taking: No sig reported)   Not Taking at Unknown time  . omeprazole (PRILOSEC) 20 MG capsule Take 20 mg by mouth in the morning and at bedtime.    prn at prn  . ondansetron (ZOFRAN) 4 MG tablet Take 1 tablet (4 mg total) by mouth daily as needed. (Patient not taking: No sig reported) 10 tablet 0 Completed Course at Unknown time  . potassium chloride (K-DUR) 10 MEQ tablet Take 10 mEq by mouth daily. (Patient not taking: No sig reported)   Not Taking at Unknown time   Social History    Socioeconomic History  . Marital status: Married    Spouse name: Not on file  . Number of children: Not on file  . Years of education: Not on file  . Highest education level: Not on file  Occupational History  . Not on file  Tobacco Use  . Smoking status: Never Smoker  . Smokeless tobacco: Never Used  Vaping Use  . Vaping Use: Never used  Substance and Sexual Activity  . Alcohol use: No  . Drug use: Never  . Sexual activity: Not on file  Other Topics Concern  . Not on file  Social History Narrative  . Not on file  Social Determinants of Health   Financial Resource Strain: Not on file  Food Insecurity: Not on file  Transportation Needs: Not on file  Physical Activity: Not on file  Stress: Not on file  Social Connections: Not on file  Intimate Partner Violence: Not on file    Family History  Problem Relation Age of Onset  . Breast cancer Paternal Aunt       Review of systems complete and found to be negative unless listed above      PHYSICAL EXAM  General: Well developed, well nourished, in no acute distress HEENT:  Normocephalic and atramatic Neck:  No JVD.  Lungs: Clear bilaterally to auscultation and percussion. Heart: Irregular irregular. Normal S1 and S2 without gallops or murmurs.  Abdomen: Bowel sounds are positive, abdomen soft and non-tender  Msk:  Back normal, normal gait. Normal strength and tone for age. Extremities: No clubbing, cyanosis or edema.   Neuro: Alert and oriented X 3. Psych:  Good affect, responds appropriately  Labs:   Lab Results  Component Value Date   WBC 8.6 08/30/2020   HGB 11.6 (L) 08/30/2020   HCT 34.8 (L) 08/30/2020   MCV 83.1 08/30/2020   PLT 256 08/30/2020    Recent Labs  Lab 08/29/20 1742 08/30/20 0500  NA 128* 130*  K 3.8 4.3  CL 97* 97*  CO2 21* 23  BUN 17 16  CREATININE 0.92 0.81  CALCIUM 9.1 8.9  PROT 6.6  --   BILITOT 1.5*  --   ALKPHOS 45  --   ALT 17  --   AST 20  --   GLUCOSE 175* 117*    Lab Results  Component Value Date   TROPONINI <0.03 03/30/2018   No results found for: CHOL No results found for: HDL No results found for: [facility] Lab Results  Component Value Date   TRIG 119 01/16/2018   TRIG 123 01/09/2018   TRIG 66 01/03/2018   No results found for: CHOLHDL No results found for: LDLDIRECT    Radiology: DG Chest Portable 1 View  Result Date: 08/29/2020 CLINICAL DATA:  Shortness of breath and weakness. Atrial fibrillation. EXAM: PORTABLE CHEST 1 VIEW COMPARISON:  01/17/2018 FINDINGS: Cardiomegaly. Aortic atherosclerosis. Bilateral pleural effusions, moderate to large on the right and small on the left. Compressive atelectasis of the right lower lung. Pulmonary venous hypertension but without frank edema. Findings consistent with congestive heart failure. IMPRESSION: Congestive heart failure with pulmonary venous hypertension and bilateral pleural effusions, moderate to large on the right and small on the left. Electronically Signed   By: Nelson Chimes M.D.   On: 08/29/2020 17:54    EKG: Atrial fibrillation nonspecific ST-T wave changes 70  ASSESSMENT AND PLAN:  Atrial fibrillation rapid ventricular response Congestive heart failure Shortness of breath GERD Hyponatremia Asthma Hypertension Primary adenocarcinoma right lung/XRT Hyperlipidemia . Plan Agree with admit to telemetry Rule out myocardial infarction Follow-up troponins EKGs Continue rate control diltiazem metoprolol amiodarone digoxin Continue anticoagulation Eliquis Maintain Mev[place]or for hyperlipidemia Correct electrolytes Heart failure therapy with Lasix metoprolol consider ACE or ARB Continue omeprazole therapy for reflux type symptoms Continue IV Lasix for shortness of breath and heart failure Echocardiogram for assessment of left ventricular function and valvular structures  Signed: Yolonda Kida MD 08/30/2020, 2:16 PM

## 2020-08-30 NOTE — Progress Notes (Signed)
[facility] NAME: Maiko Salais    MR#:  [id number]  DATE OF BIRTH:  1940/08/05  SUBJECTIVE:  patient seen in the emergency room. Husband at bedside. Patient has dementia. She answers most of the questions appropriately. Denies shortness of breath came in with increasing shortness of breath for couple days found to be in a fib with RVR. Patient has history of chronic atrial fibrillation. No chest pain today. Currently on Cardizem drip.  REVIEW OF SYSTEMS:   ROS Tolerating Diet: Tolerating PT:   DRUG ALLERGIES:   Allergies  Allergen Reactions  . Donepezil     Loss of appetite  . Poison Oak Extract Other (See Comments)    blistering  . Ampicillin Rash  . Penicillin V Potassium Rash    Has patient had a PCN reaction causing immediate rash, facial/tongue/throat swelling, SOB or lightheadedness with hypotension: No Has patient had a PCN reaction causing severe rash involving mucus membranes or skin necrosis: No Has patient had a PCN reaction that required hospitalization: No Has patient had a PCN reaction occurring within the last 10 years: No If all of the above answers are "NO", then may proceed with Cephalosporin use.     VITALS:  Blood pressure (!) 108/48, pulse 93, temperature 97.6 F (36.4 C), temperature source Oral, resp. rate (!) 26, height 4\' 11"  (1.499 m), weight 63.5 kg, SpO2 96 %.  PHYSICAL EXAMINATION:   Physical Exam  GENERAL:  80 y.o.-year-old patient lying in the bed with no acute distress.  LUNGS: decreasedbreath sounds bilaterally, no wheezing, rales, rhonchi. No use of accessory muscles of respiration.  CARDIOVASCULAR: S1, S2 normal. No murmurs, rubs, or gallops. irregulary irregular ABDOMEN: Soft, nontender, nondistended. Bowel sounds present. No organomegaly or mass.  EXTREMITIES: No cyanosis, clubbing or edema b/l.    NEUROLOGIC: nonfocal PSYCHIATRIC:  patient is alert and awake SKIN: No obvious rash,  lesion, or ulcer.   LABORATORY PANEL:  CBC Recent Labs  Lab 08/30/20 0500  WBC 8.6  HGB 11.6*  HCT 34.8*  PLT 256    Chemistries  Recent Labs  Lab 08/29/20 1742 08/29/20 1940 08/30/20 0500  NA 128*  --  130*  K 3.8  --  4.3  CL 97*  --  97*  CO2 21*  --  23  GLUCOSE 175*  --  117*  BUN 17  --  16  CREATININE 0.92  --  0.81  CALCIUM 9.1  --  8.9  MG  --  1.7  --   AST 20  --   --   ALT 17  --   --   ALKPHOS 45  --   --   BILITOT 1.5*  --   --    Cardiac Enzymes No results for input(s): TROPONINI in the last 168 hours. RADIOLOGY:  DG Chest Portable 1 View  Result Date: 08/29/2020 CLINICAL DATA:  Shortness of breath and weakness. Atrial fibrillation. EXAM: PORTABLE CHEST 1 VIEW COMPARISON:  01/17/2018 FINDINGS: Cardiomegaly. Aortic atherosclerosis. Bilateral pleural effusions, moderate to large on the right and small on the left. Compressive atelectasis of the right lower lung. Pulmonary venous hypertension but without frank edema. Findings consistent with congestive heart failure. IMPRESSION: Congestive heart failure with pulmonary venous hypertension and bilateral pleural effusions, moderate to large on the right and small on the left. Electronically Signed   By: Nelson Chimes M.D.   On: 08/29/2020 17:54   ASSESSMENT AND PLAN:  Bethena Roys  AMIE COWENS is a 80 y.o. Caucasian female with medical history significant for atrial fibrillation on Eliquis, CHF, hypertension, GERD, dementia and right lung cancer status post radiotherapy, who presented to the ER with acute onset of worsening dyspnea with generalized weakness and fatigue for a couple of days.  She was noted to be in atrial fibrillation with rapid ventricular response by EMS with a rate in the 160s.   Acute on Chronic atrial fibrillation with rapid ventricular response. - CHA2DS2-VASc score is 5. -continue her p.o. Eliquis. -- Patient currently on Cardizem drip. Heart rate 97 to 110. Received one dose of IV digoxin - p.o.  amiodarone, metoprolol and Lanoxin. -- Cardiology consultation with Dr. Clayborn Bigness   Acute on chronic likely diastolic CHF. Right pleural effusion -The patient's last 2D echo was on 11/27/2018 and revealed an EF of 50 to 55% with moderate left atrial dilatation and moderate tricuspid regurgitation as well as moderate mitral regurgitation -continue on diuresis with IV Lasix. -Consider right-sided thoracentesis if needed. -- Sats 98% on room air. Patient does not have respiratory distress   Dyslipidemia. -Statin therapy     Essential hypertension. -continue her Toprol-XL.   Dementia. - continue her Namenda.   GERD. -PPI therapy    Depression. - continue Prozac.  DVT prophylaxis: on Eliquis. Code Status: full code. Family Communication:  husband at bedside in the ER Disposition Plan: Back to previous home environment Consults : cardiology Dr. Clayborn Bigness Level of care: Progressive Cardiac Status is: Inpatient  Remains inpatient appropriate because:Inpatient level of care appropriate due to severity of illness   Dispo: The patient is from: Home              Anticipated d/c is to: Home              Patient currently is not medically stable to d/c.   Difficult to place patient No        TOTAL TIME TAKING CARE OF THIS PATIENT: 30 minutes.  >50% time spent on counselling and coordination of care  Note: This dictation was prepared with Dragon dictation along with smaller phrase technology. Any transcriptional errors that result from this process are unintentional.  Fritzi Mandes M.D    [facility]   CC: Primary care physician; Baxter Hire, MDPatient ID: Clint Lipps, female   DOB: [birth year], 80 y.o.   MRN: [id number]

## 2020-08-30 NOTE — ED Notes (Signed)
Notified Dr. Posey Pronto that pt's HR continues to range between 115-125 although pt is maxed out on her diltiazem drip at this time.

## 2020-08-30 NOTE — ED Notes (Signed)
Dr. Clayborn Bigness cardiology at bedside at this time.

## 2020-08-30 NOTE — ED Notes (Signed)
Pt declines any needs at this time

## 2020-08-30 NOTE — Progress Notes (Signed)
   08/30/20 1728  Assess: MEWS Score  BP 101/67  Pulse Rate (!) 130  Assess: MEWS Score  MEWS Temp 0  MEWS Systolic 0  MEWS Pulse 3  MEWS RR 1  MEWS LOC 0  MEWS Score 4  MEWS Score Color Red  Assess: if the MEWS score is Yellow or Red  Were vital signs taken at a resting state? Yes  Focused Assessment Change from prior assessment (see assessment flowsheet)  Early Detection of Sepsis Score *See Row Information* Low  MEWS guidelines implemented *See Row Information* Yes  Treat  MEWS Interventions Administered prn meds/treatments  Pain Scale 0-10  Pain Score 0  Patients Stated Pain Goal 0  Take Vital Signs  Increase Vital Sign Frequency  Red: Q 1hr X 4 then Q 4hr X 4, if remains red, continue Q 4hrs  Escalate  MEWS: Escalate Red: discuss with charge nurse/RN and provider, consider discussing with RRT  Notify: Charge Nurse/RN  Name of Charge Nurse/RN Notified Britt Bolognese, RN  Date Charge Nurse/RN Notified 08/30/20  Time Charge Nurse/RN Notified 1801  Notify: Provider  Provider Name/Title Dr. Rachael Fee  Date Provider Notified 08/30/20  Time Provider Notified 1805  Notification Type Page  Notification Reason Change in status  Provider response See new orders  Date of Provider Response 08/30/20  Time of Provider Response 1807  Notify: Rapid Response  Name of Rapid Response RN Notified Katie, RN  Date Rapid Response Notified 08/30/20  Time Rapid Response Notified 4132  Document  Patient Outcome Stabilized after interventions  Progress note created (see row info) Yes

## 2020-08-30 NOTE — ED Notes (Signed)
Pt's was changed with new brief and new linen.

## 2020-08-31 MED ORDER — MEMANTINE HCL 5 MG PO TABS
5.0000 mg | ORAL_TABLET | Freq: Two times a day (BID) | ORAL | Status: DC
  Administered 2020-08-31 – 2020-09-01 (×3): 5 mg via ORAL
  Filled 2020-08-31 (×4): qty 1

## 2020-08-31 MED ORDER — FUROSEMIDE 20 MG PO TABS
20.0000 mg | ORAL_TABLET | Freq: Every day | ORAL | Status: DC
  Administered 2020-09-01: 20 mg via ORAL
  Filled 2020-08-31: qty 1

## 2020-08-31 MED ORDER — MEGESTROL ACETATE 400 MG/10ML PO SUSP
600.0000 mg | Freq: Every day | ORAL | Status: DC
  Administered 2020-09-01: 600 mg via ORAL
  Filled 2020-08-31: qty 20

## 2020-08-31 MED ORDER — DILTIAZEM HCL 30 MG PO TABS
90.0000 mg | ORAL_TABLET | Freq: Two times a day (BID) | ORAL | Status: DC
  Administered 2020-08-31 – 2020-09-01 (×3): 90 mg via ORAL
  Filled 2020-08-31 (×2): qty 3

## 2020-08-31 MED ORDER — APIXABAN 2.5 MG PO TABS
2.5000 mg | ORAL_TABLET | Freq: Two times a day (BID) | ORAL | Status: DC
  Administered 2020-08-31 – 2020-09-01 (×2): 2.5 mg via ORAL
  Filled 2020-08-31 (×2): qty 1

## 2020-08-31 NOTE — Progress Notes (Addendum)
[facility] [place] NAME: Jody [name]    MR#:  [id number]  DATE OF BIRTH:  23-Mar-1941  SUBJECTIVE:   Patient resting quietly. No new issues overnight. She has been off Cardizem drip. Heart rate anywhere from 90--110 no family in the room today. REVIEW OF SYSTEMS:   Review of Systems  Constitutional: Negative for chills, fever and weight loss.  HENT: Negative for ear discharge, ear pain and nosebleeds.   Eyes: Negative for blurred vision, pain and discharge.  Respiratory: Negative for sputum production, shortness of breath, wheezing and stridor.   Cardiovascular: Negative for chest pain, palpitations, orthopnea and PND.  Gastrointestinal: Negative for abdominal pain, diarrhea, nausea and vomiting.  Genitourinary: Negative for frequency and urgency.  Musculoskeletal: Negative for back pain and joint pain.  Neurological: Negative for sensory change, speech change, focal weakness and weakness.  Psychiatric/Behavioral: Negative for depression and hallucinations. The patient is not nervous/anxious.    Tolerating Diet:yes Tolerating PT:   DRUG ALLERGIES:   Allergies  Allergen Reactions  . Donepezil     Loss of appetite  . Poison Oak Extract Other (See Comments)    blistering  . Ampicillin Rash  . Penicillin V Potassium Rash    Has patient had a PCN reaction causing immediate rash, facial/tongue/throat swelling, SOB or lightheadedness with hypotension: No Has patient had a PCN reaction causing severe rash involving mucus membranes or skin necrosis: No Has patient had a PCN reaction that required hospitalization: No Has patient had a PCN reaction occurring within the last 10 years: No If all of the above answers are "NO", then may proceed with Cephalosporin use.     VITALS:  Blood pressure (!) 121/91, pulse 98, temperature 98 F (36.7 C), temperature source Oral, resp. rate 20, height 4\' 11"  (1.499 m), weight 58.4 kg, SpO2 98  %.  PHYSICAL EXAMINATION:   Physical Exam  GENERAL:  80 y.o.-year-old patient lying in the bed with no acute distress.  LUNGS: decreasedbreath sounds bilaterally, no wheezing, rales, rhonchi. No use of accessory muscles of respiration.  CARDIOVASCULAR: S1, S2 normal. No murmurs, rubs, or gallops. irregulary irregular ABDOMEN: Soft, nontender, nondistended. Bowel sounds present. No organomegaly or mass.  EXTREMITIES: No cyanosis, clubbing or edema b/l.    NEUROLOGIC: nonfocal PSYCHIATRIC:  patient is alert and awake SKIN: No obvious rash, lesion, or ulcer.   LABORATORY PANEL:  CBC Recent Labs  Lab 08/30/20 0500  WBC 8.6  HGB 11.6*  HCT 34.8*  PLT 256    Chemistries  Recent Labs  Lab 08/29/20 1742 08/29/20 1940 08/30/20 0500  NA 128*  --  130*  K 3.8  --  4.3  CL 97*  --  97*  CO2 21*  --  23  GLUCOSE 175*  --  117*  BUN 17  --  16  CREATININE 0.92  --  0.81  CALCIUM 9.1  --  8.9  MG  --  1.7  --   AST 20  --   --   ALT 17  --   --   ALKPHOS 45  --   --   BILITOT 1.5*  --   --    Cardiac Enzymes No results for input(s): TROPONINI in the last 168 hours. RADIOLOGY:  DG Chest Portable 1 View  Result Date: 08/29/2020 CLINICAL DATA:  Shortness of breath and weakness. Atrial fibrillation. EXAM: PORTABLE CHEST 1 VIEW COMPARISON:  01/17/2018 FINDINGS: Cardiomegaly. Aortic atherosclerosis. Bilateral pleural effusions, moderate  to large on the right and small on the left. Compressive atelectasis of the right lower lung. Pulmonary venous hypertension but without frank edema. Findings consistent with congestive heart failure. IMPRESSION: Congestive heart failure with pulmonary venous hypertension and bilateral pleural effusions, moderate to large on the right and small on the left. Electronically Signed   By: Nelson Chimes M.D.   On: 08/29/2020 17:54   ASSESSMENT AND PLAN:  Jody [name] is a 80 y.o. Caucasian female with medical history significant for atrial fibrillation on  Eliquis, CHF, hypertension, GERD, dementia and right lung cancer status post radiotherapy, who presented to the ER with acute onset of worsening dyspnea with generalized weakness and fatigue for a couple of days.  She was noted to be in atrial fibrillation with rapid ventricular response by EMS with a rate in the 160s.   Acute on Chronic atrial fibrillation with rapid ventricular response. - CHA2DS2-VASc score is 5. -continue her p.o. Eliquis. -- Patient weaned off Cardizem drip. Heart rate 97 to 110.  - p.o. amiodarone, metoprolol and Lanoxin. -prn Cardizem 30 mg q6 per Cards -- Cardiology consultation with Dr. Clayborn Bigness noted   Acute on chronic likely diastolic CHF. Right pleural effusion -The patient's last 2D echo was on 11/27/2018 and revealed an EF of 50 to 55% with moderate left atrial dilatation and moderate tricuspid regurgitation as well as moderate mitral regurgitation -continue on diuresis with IV Lasix 40 mg qd (home dose 20 mg) --Good UOP 2300 cc -Consider right-sided thoracentesis if needed. -- Sats 98% on room air. Patient does not have respiratory distress   Dyslipidemia. -Statin therapy     Essential hypertension. -continue her Toprol-XL.   Dementia. - continue her Namenda.   GERD. -PPI therapy    Depression. - continue Prozac.  DVT prophylaxis: on Eliquis. Code Status: full code. Family Communication:  husband at bedside in the ER on 3/26 Disposition Plan: Back to previous home environment Consults : cardiology Dr. Clayborn Bigness Level of care: Progressive Cardiac Status is: Inpatient  Remains inpatient appropriate because:Inpatient level of care appropriate due to severity of illness   Dispo: The patient is from: Home              Anticipated d/c is to: Home              Patient currently is not medically stable to d/c.   Difficult to place patient No        TOTAL TIME TAKING CARE OF THIS PATIENT: 25 minutes.  >50% time spent on counselling  and coordination of care  Note: This dictation was prepared with Dragon dictation along with smaller phrase technology. Any transcriptional errors that result from this process are unintentional.  Fritzi Mandes M.D    [facility]   CC: Primary care physician; Baxter Hire, MDPatient ID: Clint Lipps, female   DOB: 1940-07-04, 80 y.o.   MRN: [id number]

## 2020-08-31 NOTE — Progress Notes (Signed)
   08/31/20 0008  Assess: MEWS Score  Temp 98.2 F (36.8 C)  BP 130/69  Pulse Rate (!) 116  ECG Heart Rate (!) 116  Resp 20  Level of Consciousness Alert  SpO2 98 %  O2 Device Room Air  Patient Activity (if Appropriate) In bed  Assess: if the MEWS score is Yellow or Red  Were vital signs taken at a resting state? Yes  Focused Assessment No change from prior assessment  Early Detection of Sepsis Score *See Row Information* Low  MEWS guidelines implemented *See Row Information* Yes (Will continue to check VSS Q2 as PRN Metop 2.5mg  IV is ordered for elevated HR Q1 hour)  Treat  MEWS Interventions Escalated (See documentation below);Administered scheduled meds/treatments  Pain Scale PAINAD  Breathing 0  Negative Vocalization 0  Facial Expression 0  Body Language 0  Consolability 0  PAINAD Score 0  Take Vital Signs  Increase Vital Sign Frequency  Yellow: Q 2hr X 2 then Q 4hr X 2, if remains yellow, continue Q 4hrs  Escalate  MEWS: Escalate Yellow: discuss with charge nurse/RN and consider discussing with provider and RRT  Notify: Charge Nurse/RN  Name of Charge Nurse/RN Notified Mericar RN  Date Charge Nurse/RN Notified 08/31/20  Time Charge Nurse/RN Notified 0020  Notify: Provider  Provider Name/Title B. Randol Kern NP  Date Provider Notified 08/31/20  Time Provider Notified 0045  Notification Type  (secure chat)  Notification Reason Change in status (Pt is yellow MEWS, fluctuates between Red and Yellow d/t Afib HR 102-140's. Admin Metop 2.5mg  and Cardizem 30mg  PO for MEWS. Pt otherwise stable. BP 130/69 (87). Will follow MEWS VSS protocol and monitor.- Thanks)  Provider response No new orders (Administer PRN as ordered and continue to monitor VSS)  Date of Provider Response 08/31/20  Time of Provider Response [phone]  Document  Patient Outcome Other (Comment) (Interventions implemented, pt remains on unit, stable, and will continue to monitor pt and VSS)  Progress note  created (see row info) Yes

## 2020-08-31 NOTE — Evaluation (Signed)
Physical Therapy Evaluation Patient Details Name: Jody [name] MRN: [id number] DOB: November 06, 1940 Today's Date: 08/31/2020   History of Present Illness  80 y.o. Caucasian female with medical history significant for atrial fibrillation on Eliquis, CHF, hypertension, GERD, dementia and right lung cancer status post radiotherapy, who presented to the ER with acute onset of worsening dyspnea with generalized weakness and fatigue for a couple of days.  She was noted to be in atrial fibrillation with rapid ventricular response by EMS with a rate in the 160s  Clinical Impression  Pt pleasantly confused but able and willing to participate with PT.  She did well with bed mobility and transfers, needing no direct assist, additionally she was able to ambulate with only minimal cuing; mostly to stay inside walker and to try lengthening her relatively choppy cadence (son reports he is near her baseline).  Pt did not have excessive fatigue but HR kept creeping up from 90s to 130s and when it did not consistently drop below 120bpm  PT halted further ambulation and returned pt to room.  Pt likely will not need further PT at discharge, but we will keep her on the schedule and hope to perform longer bout of ambulation and trial stairs.    Follow Up Recommendations No PT follow up (will maintain on caseload to trial steps and encourage mobility/activity while admitted)    Equipment Recommendations  None recommended by PT    Recommendations for Other Services       Precautions / Restrictions Precautions Precautions: Fall Restrictions Weight Bearing Restrictions: No      Mobility  Bed Mobility Overal bed mobility: Modified Independent             General bed mobility comments: EOB w/o assist    Transfers Overall transfer level: Modified independent Equipment used: Rolling walker (2 wheeled)             General transfer comment: able to rise to standing w/o assist, minimal  cuing  Ambulation/Gait Ambulation/Gait assistance: Min guard Gait Distance (Feet): 75 Feet Assistive device: Rolling walker (2 wheeled)       General Gait Details: Pt with short, choppy steps that son reports as close to her baseline.  Her O2 remained in the mid/high 90s the entire time but she did have gradually increasing HR, ultimately getting into the mid 130s and not dropping below 120 to PT suggested stopping before pt had much subjective fatigue.  Stairs            Wheelchair Mobility    Modified Rankin (Stroke Patients Only)       Balance Overall balance assessment: Modified Independent                                           Pertinent Vitals/Pain Pain Assessment: No/denies pain    Home Living Family/patient expects to be discharged to:: Private residence Living Arrangements: Spouse/significant other Available Help at Discharge: Family;Available 24 hours/day;Personal care attendant (aide M-F 11-5, son there daily and over the weekend) Type of Home: House Home Access: Stairs to enter Entrance Stairs-Rails:  (steps up to mud room with L rail + 4 steps into kitchen R rail) Entrance Stairs-Number of Steps: 3+4   Home Equipment: Walker - 2 wheels      Prior Function Level of Independence: Independent with assistive device(s)         Comments:  son reports that pt gets out of the home a few times a week and generally is able to manage in-home mobility w/o needing direct supervision, son helps with meds and meals     Hand Dominance        Extremity/Trunk Assessment   Upper Extremity Assessment Upper Extremity Assessment: Overall WFL for tasks assessed    Lower Extremity Assessment Lower Extremity Assessment: Overall WFL for tasks assessed       Communication   Communication: No difficulties  Cognition Arousal/Alertness: Awake/alert Behavior During Therapy: Flat affect Overall Cognitive Status: History of cognitive  impairments - at baseline                                        General Comments      Exercises     Assessment/Plan    PT Assessment Patient needs continued PT services  PT Problem List Decreased activity tolerance;Decreased balance;Decreased safety awareness;Decreased mobility;Decreased cognition;Decreased knowledge of use of DME       PT Treatment Interventions DME instruction;Gait training;Stair training;Functional mobility training;Therapeutic activities;Therapeutic exercise;Balance training;Patient/family education;Cognitive remediation    PT Goals (Current goals can be found in the Care Plan section)  Acute Rehab PT Goals Patient Stated Goal: go home PT Goal Formulation: With patient Time For Goal Achievement: 09/14/20 Potential to Achieve Goals: Good    Frequency Min 2X/week   Barriers to discharge        Co-evaluation               AM-PAC PT "6 Clicks" Mobility  Outcome Measure Help needed turning from your back to your side while in a flat bed without using bedrails?: None Help needed moving from lying on your back to sitting on the side of a flat bed without using bedrails?: None Help needed moving to and from a bed to a chair (including a wheelchair)?: None Help needed standing up from a chair using your arms (e.g., wheelchair or bedside chair)?: None Help needed to walk in hospital room?: A Little Help needed climbing 3-5 steps with a railing? : A Little 6 Click Score: 22    End of Session Equipment Utilized During Treatment: Gait belt Activity Tolerance: Patient tolerated treatment well;Patient limited by fatigue Patient left: with chair alarm set;with call bell/phone within reach;with family/visitor present Nurse Communication: Mobility status PT Visit Diagnosis: Muscle weakness (generalized) (M62.81);Difficulty in walking, not elsewhere classified (R26.2)    Time: 3212-2482 PT Time Calculation (min) (ACUTE ONLY): 26  min   Charges:   PT Evaluation $PT Eval Low Complexity: 1 Low PT Treatments $Gait Training: 8-22 mins        Kreg Shropshire, DPT 08/31/2020, 4:15 PM

## 2020-08-31 NOTE — Progress Notes (Signed)
[facility] Cardiology    SUBJECTIVE: Patient is disoriented confused but alert and able to answer questions.  Heart rate slightly irregular and tachycardic   Vitals:   08/31/20 0200 08/31/20 0400 08/31/20 0700 08/31/20 0720  BP: (!) 105/58 113/68 (!) 144/99 (!) 121/91  Pulse: 100 79 61 98  Resp: 20 20 (!) 28 20  Temp: 97.6 F (36.4 C) 98.8 F (37.1 C) 97.7 F (36.5 C) 98 F (36.7 C)  TempSrc: Oral Oral Axillary Oral  SpO2: 97% 96% 95% 98%  Weight:  58.4 kg    Height:         Intake/Output Summary (Last 24 hours) at 08/31/2020 0954 Last data filed at 08/31/2020 0500 Gross per 24 hour  Intake 696.15 ml  Output 2300 ml  Net -1603.85 ml      PHYSICAL EXAM  General: Well developed, well nourished, in no acute distress HEENT:  Normocephalic and atramatic Neck:  No JVD.  Lungs: Clear bilaterally to auscultation and percussion. Heart: HRRR . Normal S1 and S2 without gallops or murmurs.  Abdomen: Bowel sounds are positive, abdomen soft and non-tender  Msk:  Back normal, normal gait. Normal strength and tone for age. Extremities: No clubbing, cyanosis or edema.   Neuro: Alert and confusion dementia X 3. Psych:  Good affect, responds appropriately   LABS: Basic Metabolic Panel: Recent Labs    08/29/20 1742 08/29/20 1940 08/30/20 0500  NA 128*  --  130*  K 3.8  --  4.3  CL 97*  --  97*  CO2 21*  --  23  GLUCOSE 175*  --  117*  BUN 17  --  16  CREATININE 0.92  --  0.81  CALCIUM 9.1  --  8.9  MG  --  1.7  --    Liver Function Tests: Recent Labs    08/29/20 1742  AST 20  ALT 17  ALKPHOS 45  BILITOT 1.5*  PROT 6.6  ALBUMIN 3.7   No results for input(s): LIPASE, AMYLASE in the last 72 hours. CBC: Recent Labs    08/29/20 1742 08/30/20 0500  WBC 10.9* 8.6  HGB 12.5 11.6*  HCT 36.5 34.8*  MCV 82.2 83.1  PLT 320 256   Cardiac Enzymes: No results for input(s): CKTOTAL, CKMB, CKMBINDEX, TROPONINI in the last 72 hours. BNP: Invalid input(s):  POCBNP D-Dimer: No results for input(s): DDIMER in the last 72 hours. Hemoglobin A1C: No results for input(s): HGBA1C in the last 72 hours. Fasting Lipid Panel: No results for input(s): CHOL, HDL, LDLCALC, TRIG, CHOLHDL, LDLDIRECT in the last 72 hours. Thyroid Function Tests: Recent Labs    08/29/20 1940  TSH 1.314   Anemia Panel: No results for input(s): VITAMINB12, FOLATE, FERRITIN, TIBC, IRON, RETICCTPCT in the last 72 hours.  DG Chest Portable 1 View  Result Date: 08/29/2020 CLINICAL DATA:  Shortness of breath and weakness. Atrial fibrillation. EXAM: PORTABLE CHEST 1 VIEW COMPARISON:  01/17/2018 FINDINGS: Cardiomegaly. Aortic atherosclerosis. Bilateral pleural effusions, moderate to large on the right and small on the left. Compressive atelectasis of the right lower lung. Pulmonary venous hypertension but without frank edema. Findings consistent with congestive heart failure. IMPRESSION: Congestive heart failure with pulmonary venous hypertension and bilateral pleural effusions, moderate to large on the right and small on the left. Electronically Signed   By: Nelson Chimes M.D.   On: 08/29/2020 17:54     Echo preserved left ventricular function of 50%  TELEMETRY: Atrial fibrillation rate of 130 nonspecific findings  ASSESSMENT  AND PLAN:  Active Problems:   Atrial fibrillation with rapid ventricular response (HCC)   Acute on chronic congestive heart failure (HCC)   Pleural effusion on right   Alzheimer's dementia without behavioral disturbance (HCC)    Plan Atrial fibrillation rate of around 110 continue current therapy advance rate control meds Continue diuretic therapy for congestive heart failure Mild pleural effusion continue diuretic therapy Moderate to severe dementia no behavioral disturbance continue current therapy Transition diltiazem from as needed to regular 90 mg twice a day Continue anticoagulation with Eliquis 2.5 twice daily   Yolonda Kida,  MD 08/31/2020 9:54 AM

## 2020-08-31 NOTE — Progress Notes (Signed)
PHARMACY NOTE:  DOSAGE ADJUSTMENT  Current apixaban regimen includes a mismatch between dosage and suggested dosage.   Current apixaban dosage:  5 mg po BID  Indication: atrial fibrillation  Dose Reduction Criteria:  1) Serum creatinine ?1.5 mg/dL  2) ? 80 years of age  41) body weight ? 60 kg  Additional comments: this patient meets dose reduction criteria based on indication and meeting 2/3 above criteria    apixaban dosage has been changed to:  2.5 mg po BID    Thank you for allowing pharmacy to be a part of this patient's care.  Dallie Piles, [facility] 08/31/2020 1:11 PM

## 2020-09-01 MED ORDER — AMIODARONE HCL 200 MG PO TABS: 200.0000 mg | ORAL_TABLET | Freq: Two times a day (BID) | ORAL | 1 refills | Status: DC

## 2020-09-01 MED ORDER — DILTIAZEM HCL 60 MG PO TABS: 60.0000 mg | ORAL_TABLET | Freq: Two times a day (BID) | ORAL | 1 refills | Status: DC

## 2020-09-01 MED ORDER — METOPROLOL SUCCINATE ER 50 MG PO TB24: 50.0000 mg | ORAL_TABLET | Freq: Two times a day (BID) | ORAL | 2 refills | Status: DC

## 2020-09-01 MED ORDER — APIXABAN 2.5 MG PO TABS: 2.5000 mg | ORAL_TABLET | Freq: Two times a day (BID) | ORAL | 3 refills | Status: AC

## 2020-09-01 MED ORDER — AMIODARONE HCL 200 MG PO TABS: 200.0000 mg | ORAL_TABLET | Freq: Two times a day (BID) | ORAL | 2 refills | Status: DC

## 2020-09-01 MED ORDER — APIXABAN 2.5 MG PO TABS: 2.5000 mg | ORAL_TABLET | Freq: Two times a day (BID) | ORAL | 2 refills | Status: DC

## 2020-09-01 MED ORDER — METOPROLOL SUCCINATE ER 50 MG PO TB24: 50.0000 mg | ORAL_TABLET | Freq: Two times a day (BID) | ORAL | 2 refills | Status: AC

## 2020-09-01 NOTE — Plan of Care (Signed)

## 2020-09-01 NOTE — Progress Notes (Signed)
[facility] Cardiology    SUBJECTIVE: Has significant dementia difficult providing history 820 questions appears to be in no significant distress vital signs appear to be reasonably stable we will continue to advance medications and hopefully anticipate a speedy discharge   Vitals:   08/31/20 1629 08/31/20 1934 08/31/20 2220 09/01/20 0425  BP: 107/83 109/71 101/66 110/78  Pulse: 91 98 97 76  Resp: 18 20 20 18   Temp: 98 F (36.7 C) (!) 97.3 F (36.3 C)  98.6 F (37 C)  TempSrc: Axillary Oral  Oral  SpO2: 94% 98% 95% 98%  Weight:    57.2 kg  Height:         Intake/Output Summary (Last 24 hours) at 09/01/2020 7673 Last data filed at 08/31/2020 2115 Gross per 24 hour  Intake 60 ml  Output 1850 ml  Net -1790 ml      PHYSICAL EXAM  General: Well developed, well nourished, in no acute distress HEENT:  Normocephalic and atramatic Neck:  No JVD.  Lungs: Clear bilaterally to auscultation and percussion. Heart: Irregular. Normal S1 and S2 without gallops or murmurs.  Abdomen: Bowel sounds are positive, abdomen soft and non-tender  Msk:  Back normal, normal gait. Normal strength and tone for age. Extremities: No clubbing, cyanosis or edema.   Neuro: Alert and oriented X 3. Psych:  Good affect, responds appropriately   LABS: Basic Metabolic Panel: Recent Labs    08/29/20 1742 08/29/20 1940 08/30/20 0500  NA 128*  --  130*  K 3.8  --  4.3  CL 97*  --  97*  CO2 21*  --  23  GLUCOSE 175*  --  117*  BUN 17  --  16  CREATININE 0.92  --  0.81  CALCIUM 9.1  --  8.9  MG  --  1.7  --    Liver Function Tests: Recent Labs    08/29/20 1742  AST 20  ALT 17  ALKPHOS 45  BILITOT 1.5*  PROT 6.6  ALBUMIN 3.7   No results for input(s): LIPASE, AMYLASE in the last 72 hours. CBC: Recent Labs    08/29/20 1742 08/30/20 0500  WBC 10.9* 8.6  HGB 12.5 11.6*  HCT 36.5 34.8*  MCV 82.2 83.1  PLT 320 256   Cardiac Enzymes: No results for input(s): CKTOTAL, CKMB, CKMBINDEX,  TROPONINI in the last 72 hours. BNP: Invalid input(s): POCBNP D-Dimer: No results for input(s): DDIMER in the last 72 hours. Hemoglobin A1C: No results for input(s): HGBA1C in the last 72 hours. Fasting Lipid Panel: No results for input(s): CHOL, HDL, LDLCALC, TRIG, CHOLHDL, LDLDIRECT in the last 72 hours. Thyroid Function Tests: Recent Labs    08/29/20 1940  TSH 1.314   Anemia Panel: No results for input(s): VITAMINB12, FOLATE, FERRITIN, TIBC, IRON, RETICCTPCT in the last 72 hours.  No results found.   Echo preserved left ventricular function 50-55  TELEMETRY: Atrial fibrillation rate of around 98 nonspecific findings  ASSESSMENT AND PLAN:  Active Problems:   Atrial fibrillation with rapid ventricular response (HCC)   Acute on chronic congestive heart failure (HCC)   Pleural effusion on right   Alzheimer's dementia without behavioral disturbance (HCC) Tachycardia Mild heart failure  Plan Agree with telemetry Follow-up heart rate management and control for A. Fib Continue amiodarone load p.o. and subsequent maintenance Agree with Eliquis 2.5 twice a day for anticoagulation Rate control requires metoprolol 100 twice a day as well as Cardizem and digoxin will increase Cardizem to around 120 twice a  day Increase activity ambulate with assistance Continue low-dose diuretic therapy for mild heart failure Hopefully plan on discharge soon   Yolonda Kida, MD 09/01/2020 6:52 AM

## 2020-09-01 NOTE — Discharge Summary (Addendum)
[facility] NAME: Jody [name]    MR#:  [id number]  DATE OF BIRTH:  14-Apr-1941  DATE OF ADMISSION:  08/29/2020 ADMITTING PHYSICIAN: Christel Mormon, MD  DATE OF DISCHARGE: 09/01/2020  PRIMARY CARE PHYSICIAN: Baxter Hire, MD    ADMISSION DIAGNOSIS:  Atrial fibrillation with rapid ventricular response (HCC) [I48.91] Acute on chronic congestive heart failure, unspecified heart failure type ([place]) [I50.9]  DISCHARGE DIAGNOSIS:  Afib with RVR acute on chronic CHF, Acute on Chronic diastolic  SECONDARY DIAGNOSIS:   Past Medical History:  Diagnosis Date  . Asthma   . Atrial fibrillation ([place])   . CHF (congestive heart failure) ([place])   . 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 Dysrhythmia   . GERD (gastroesophageal reflux disease)   . Hypertension   . Memory difficulties   . 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   . Uses walker     HOSPITAL COURSE:   Jody Dancy Binghamis a 80 y.o.Caucasian femalewith medical history significant foratrial fibrillation on Eliquis, CHF, hypertension, GERD, dementia and right lung cancer status post radiotherapy, who presented to the ER with acute onset of worsening dyspnea with generalized weakness and fatigue for a couple of days. She was noted to be in atrial fibrillation with rapid ventricular response by EMS with a rate in the 160s.  Acute on Chronic atrial fibrillation with rapid ventricular response. - CHA2DS2-VASc score is 5. -continue her p.o. Eliquis 2.5 mg bid (per pharmacy) -- Patient weaned off Cardizem drip. Heart rate 97 to 110.  - p.o. amiodarone (new), metoprolol and Lanoxin. --added cardizem 60 mg bid with holding parameters (d/ced Cardizem CD) -- Cardiology consultation with Dr. Clayborn Bigness noted.  Acute on chronic likely diastolic CHF. Right pleural effusion -The patient's last 2D echo was on 6/22/2020and revealed an EF of 50  to 55% with moderate left atrial dilatation and moderate tricuspid regurgitation as well as moderate mitral regurgitation -continue on diuresis with IV Lasix 40 mg qd (home dose 20 mg)--changed to home dose lasix --Good UOP 2300 cc -- Sats 98% on room air. Patient does not have respiratory distress   Dyslipidemia. -Statin therapy    Essential hypertension. -continue her Toprol-XL.  Dementia. - continue her Namenda.  GERD. -PPI therapy    Depression. - continue Prozac.  DVT prophylaxis:on Eliquis. Code Status:full code. Family Communication: husband at bedside in the room today Disposition Plan:Back to previous home environment Consults : cardiology Dr. Clayborn Bigness Level of care: Progressive Cardiac Status is: Inpatient   Dispo: The patient is from: Home  Anticipated d/c is to: Home  Patient currently is  medically stable to d/c.              Difficult to place patient No  PT--no PT needs discharge instructions were discussed with patient and her husband. Patient will follow-up with Dr. Nehemiah Massed in a week to 10 days  CONSULTS OBTAINED:  Treatment Team:  Yolonda Kida, MD  DRUG ALLERGIES:   Allergies  Allergen Reactions  . Donepezil     Loss of appetite  . Poison Oak Extract Other (See Comments)    blistering  . Ampicillin Rash  . Penicillin V Potassium Rash    Has patient had a PCN reaction causing immediate rash, facial/tongue/throat swelling, SOB or lightheadedness with hypotension: No Has patient had a PCN reaction causing severe rash involving mucus membranes or skin necrosis: No  Has patient had a PCN reaction that required hospitalization: No Has patient had a PCN reaction occurring within the last 10 years: No If all of the above answers are "NO", then may proceed with Cephalosporin use.     DISCHARGE MEDICATIONS:   Allergies as of 09/01/2020      Reactions   Donepezil    Loss of appetite   Poison Oak  Extract Other (See Comments)   blistering   Ampicillin Rash   Penicillin V Potassium Rash   Has patient had a PCN reaction causing immediate rash, facial/tongue/throat swelling, SOB or lightheadedness with hypotension: No Has patient had a PCN reaction causing severe rash involving mucus membranes or skin necrosis: No Has patient had a PCN reaction that required hospitalization: No Has patient had a PCN reaction occurring within the last 10 years: No If all of the above answers are "NO", then may proceed with Cephalosporin use.      Medication List    STOP taking these medications   diltiazem 120 MG 24 hr capsule Commonly known as: CARDIZEM CD   montelukast 10 MG tablet Commonly known as: SINGULAIR   ondansetron 4 MG tablet Commonly known as: Zofran   potassium chloride 10 MEQ tablet Commonly known as: KLOR-CON     TAKE these medications   acetaminophen 325 MG tablet Commonly known as: TYLENOL Take 2 tablets (650 mg total) by mouth every 6 (six) hours as needed (fever > 101).   albuterol 108 (90 Base) MCG/ACT inhaler Commonly known as: VENTOLIN HFA Inhale 2 puffs into the lungs every 6 (six) hours as needed for wheezing or shortness of breath.   albuterol (2.5 MG/3ML) 0.083% nebulizer solution Commonly known as: PROVENTIL Take 1.25 mg by nebulization every 6 (six) hours as needed for wheezing or shortness of breath.   alendronate 70 MG tablet Commonly known as: FOSAMAX Take 70 mg by mouth once a week.   ALPRAZolam 0.5 MG tablet Commonly known as: XANAX Take 0.5 mg by mouth 2 (two) times daily as needed.   amiodarone 200 MG tablet Commonly known as: Pacerone Take 1 tablet (200 mg total) by mouth 2 (two) times daily.   apixaban 2.5 MG Tabs tablet Commonly known as: ELIQUIS Take 1 tablet (2.5 mg total) by mouth 2 (two) times daily. What changed:   medication strength  how much to take   Calcium Carbonate-Vitamin D 600-200 MG-UNIT Tabs Take 1 tablet by mouth  daily.   digoxin 0.125 MG tablet Commonly known as: LANOXIN Take 1 tablet (0.125 mg total) by mouth daily.   diltiazem 60 MG tablet Commonly known as: CARDIZEM Take 1 tablet (60 mg total) by mouth 2 (two) times daily. Hold if BP <120   FLUoxetine 20 MG capsule Commonly known as: PROZAC Take 20 mg by mouth daily.   furosemide 20 MG tablet Commonly known as: LASIX Take 20 mg by mouth daily.   lovastatin 20 MG tablet Commonly known as: MEVACOR Take 20 mg by mouth daily with supper.   megestrol 625 MG/5ML suspension Commonly known as: MEGACE ES Take 625 mg by mouth daily.   memantine 5 MG tablet Commonly known as: NAMENDA Take 5 mg by mouth 2 (two) times daily.   metoprolol succinate 50 MG 24 hr tablet Commonly known as: Toprol XL Take 1 tablet (50 mg total) by mouth in the morning and at bedtime. Hold if BP is <120 What changed:   when to take this  additional instructions   Multi-Vitamins Tabs Take  1 tablet by mouth daily.   omeprazole 20 MG capsule Commonly known as: PRILOSEC Take 20 mg by mouth in the morning and at bedtime.   traMADol 50 MG tablet Commonly known as: ULTRAM Take 50 mg by mouth every 6 (six) hours as needed for moderate pain.   traZODone 50 MG tablet Commonly known as: DESYREL Take 50 mg by mouth at bedtime.       If you experience worsening of your admission symptoms, develop shortness of breath, life threatening emergency, suicidal or homicidal thoughts you must seek medical attention immediately by calling 911 or calling your MD immediately  if symptoms less severe.  You Must read complete instructions/literature along with all the possible adverse reactions/side effects for all the Medicines you take and that have been prescribed to you. Take any new Medicines after you have completely understood and accept all the possible adverse reactions/side effects.   Please note  You were cared for by a hospitalist during your hospital stay. If  you have any questions about your discharge medications or the care you received while you were in the hospital after you are discharged, you can call the unit and asked to speak with the hospitalist on call if the hospitalist that took care of you is not available. Once you are discharged, your primary care physician will handle any further medical issues. Please note that NO REFILLS for any discharge medications will be authorized once you are discharged, as it is imperative that you return to your primary care physician (or establish a relationship with a primary care physician if you do not have one) for your aftercare needs so that they can reassess your need for medications and monitor your lab values. Today   SUBJECTIVE   Doing well. Patient desires to go home.  VITAL SIGNS:  Blood pressure (!) 97/52, pulse (!) 51, temperature 98 F (36.7 C), temperature source Oral, resp. rate 17, height 4\' 11"  (1.499 m), weight 57.2 kg, SpO2 97 %.  I/O:    Intake/Output Summary (Last 24 hours) at 09/01/2020 1221 Last data filed at 09/01/2020 1133 Gross per 24 hour  Intake 420 ml  Output 2150 ml  Net -1730 ml    PHYSICAL EXAMINATION:  GENERAL:  80 y.o.-year-old patient lying in the bed with no acute distress.  LUNGS: Normal breath sounds bilaterally, no wheezing, rales,rhonchi or crepitation. No use of accessory muscles of respiration.  CARDIOVASCULAR: S1, S2 normal. No murmurs, rubs, or gallops.  ABDOMEN: Soft, non-tender, non-distended. Bowel sounds present. No organomegaly or mass.  EXTREMITIES: No pedal edema, cyanosis, or clubbing.  NEUROLOGIC: Cranial nerves II through XII are intact. Muscle strength 5/5 in all extremities. Sensation intact. Gait not checked.  PSYCHIATRIC: The patient is alert and oriented x 3.  SKIN: No obvious rash, lesion, or ulcer.   DATA REVIEW:   CBC  Recent Labs  Lab 08/30/20 0500  WBC 8.6  HGB 11.6*  HCT 34.8*  PLT 256    Chemistries  Recent Labs  Lab  08/29/20 1742 08/29/20 1940 08/30/20 0500  NA 128*  --  130*  K 3.8  --  4.3  CL 97*  --  97*  CO2 21*  --  23  GLUCOSE 175*  --  117*  BUN 17  --  16  CREATININE 0.92  --  0.81  CALCIUM 9.1  --  8.9  MG  --  1.7  --   AST 20  --   --   ALT  17  --   --   ALKPHOS 45  --   --   BILITOT 1.5*  --   --     Microbiology Results   Recent Results (from the past 240 hour(s))  Resp Panel by RT-PCR (Flu A&B, Covid) Nasopharyngeal Swab     Status: None   Collection Time: 08/29/20  6:02 PM   Specimen: Nasopharyngeal Swab; Nasopharyngeal(NP) swabs in vial transport medium  Result Value Ref Range Status   SARS Coronavirus 2 by RT PCR NEGATIVE NEGATIVE Final    Comment: (NOTE) SARS-CoV-2 target nucleic acids are NOT DETECTED.  The SARS-CoV-2 RNA is generally detectable in upper respiratory specimens during the acute phase of infection. The lowest concentration of SARS-CoV-2 viral copies this assay can detect is 138 copies/mL. A negative result does not preclude SARS-Cov-2 infection and should not be used as the sole basis for treatment or other patient management decisions. A negative result may occur with  improper specimen collection/handling, submission of specimen other than nasopharyngeal swab, presence of viral mutation(s) within the areas targeted by this assay, and inadequate number of viral copies(<138 copies/mL). A negative result must be combined with clinical observations, patient history, and epidemiological information. The expected result is Negative.  Fact Sheet for Patients:  EntrepreneurPulse.com.au  Fact Sheet for Healthcare Providers:  IncredibleEmployment.be  This test is no t yet approved or cleared by the [place] FDA and  has been authorized for detection and/or diagnosis of SARS-CoV-2 by FDA under an Emergency Use Authorization (EUA). This EUA will remain  in effect (meaning this test can be used) for the duration of  the COVID-19 declaration under Section 564(b)(1) of the Act, 21 U.S.C.section 360bbb-3(b)(1), unless the authorization is terminated  or revoked sooner.       Influenza A by PCR NEGATIVE NEGATIVE Final   Influenza B by PCR NEGATIVE NEGATIVE Final    Comment: (NOTE) The Xpert Xpress SARS-CoV-2/FLU/RSV plus assay is intended as an aid in the diagnosis of influenza from Nasopharyngeal swab specimens and should not be used as a sole basis for treatment. Nasal washings and aspirates are unacceptable for Xpert Xpress SARS-CoV-2/FLU/RSV testing.  Fact Sheet for Patients: EntrepreneurPulse.com.au  Fact Sheet for Healthcare Providers: IncredibleEmployment.be  This test is not yet approved or cleared by the [place] FDA and has been authorized for detection and/or diagnosis of SARS-CoV-2 by FDA under an Emergency Use Authorization (EUA). This EUA will remain in effect (meaning this test can be used) for the duration of the COVID-19 declaration under Section 564(b)(1) of the Act, 21 U.S.C. section 360bbb-3(b)(1), unless the authorization is terminated or revoked.  Performed at [facility], [address], [place], [place] [address]     RADIOLOGY:  No results found.   CODE STATUS:     Code Status Orders  (From admission, onward)         Start     Ordered   08/29/20 1939  Full code  Continuous        08/29/20 1942        Code Status History    Date Active Date Inactive Code Status Order ID Comments User Context   11/28/2018 1654 12/03/2018 1842 Full Code [id number]  Benjamine Sprague, DO Inpatient   12/27/2017 1601 01/22/2018 1704 DNR [id number]  Flora Lipps, MD Inpatient   12/27/2017 1204 12/27/2017 1601 Full Code [id number]  Benjamine Sprague, DO ED   12/27/2017 1204 12/27/2017 1204 Full Code [id number]  Benjamine Sprague, DO ED  Advance Care Planning Activity    Advance Directive Documentation   Flowsheet Row Most Recent Value  Type of  Advance Directive Living will  Pre-existing out of facility DNR order (yellow form or pink MOST form) --  "MOST" Form in Place? --       TOTAL TIME TAKING CARE OF THIS PATIENT: *35* minutes.    Fritzi Mandes M.D  [facility]    CC: Primary care physician; Baxter Hire, MD

## 2020-09-01 NOTE — Care Management Important Message (Signed)
Important Message  Patient Details  Name: Jody [name] MRN: [id number] Date of Birth: [birth year]   Medicare Important Message Given:  Yes     Dannette Barbara 09/01/2020, 11:56 AM

## 2020-09-22 ENCOUNTER — Other Ambulatory Visit: Payer: Self-pay

## 2020-09-22 ENCOUNTER — Other Ambulatory Visit
Admission: RE | Admit: 2020-09-22 | Discharge: 2020-09-22 | Disposition: A | Discharge status: Home / Self Care | Payer: Medicare Other | Source: Ambulatory Visit | Attending: Internal Medicine | Admitting: Internal Medicine

## 2020-09-22 DIAGNOSIS — Z01812 Encounter for preprocedural laboratory examination: Secondary | ICD-10-CM | POA: Diagnosis present

## 2020-09-22 DIAGNOSIS — Z20822 Contact with and (suspected) exposure to covid-19: Secondary | ICD-10-CM | POA: Insufficient documentation

## 2020-09-23 LAB — SARS CORONAVIRUS 2 (TAT 6-24 HRS): SARS Coronavirus 2: NEGATIVE

## 2020-09-24 ENCOUNTER — Encounter
Admission: RE | Disposition: A | Discharge status: Home / Self Care | Payer: Self-pay | Source: Ambulatory Visit | Attending: Internal Medicine

## 2020-09-24 ENCOUNTER — Other Ambulatory Visit: Payer: Self-pay

## 2020-09-24 ENCOUNTER — Encounter: Payer: Self-pay | Admitting: Internal Medicine

## 2020-09-24 ENCOUNTER — Ambulatory Visit
Admission: RE | Admit: 2020-09-24 | Discharge: 2020-09-24 | Disposition: A | Discharge status: Home / Self Care | Payer: Medicare Other | Source: Ambulatory Visit | Attending: Internal Medicine | Admitting: Internal Medicine

## 2020-09-24 ENCOUNTER — Ambulatory Visit: Payer: Medicare Other | Admitting: Anesthesiology

## 2020-09-24 DIAGNOSIS — I480 Paroxysmal atrial fibrillation: Secondary | ICD-10-CM | POA: Diagnosis present

## 2020-09-24 HISTORY — PX: CARDIOVERSION: SHX1299

## 2020-09-24 SURGERY — CARDIOVERSION
Anesthesia: General

## 2020-09-24 MED ORDER — SODIUM CHLORIDE 0.9 % IV SOLN: INTRAVENOUS | Status: DC | PRN

## 2020-09-24 MED ORDER — PROPOFOL 10 MG/ML IV BOLUS
INTRAVENOUS | Status: DC | PRN
  Administered 2020-09-24: 10 mg via INTRAVENOUS
  Administered 2020-09-24: 5 mg via INTRAVENOUS
  Administered 2020-09-24: 40 mg via INTRAVENOUS

## 2020-09-24 MED ORDER — ONDANSETRON HCL 4 MG/2ML IJ SOLN: 4.0000 mg | Freq: Once | INTRAMUSCULAR | Status: DC | PRN

## 2020-09-24 NOTE — Anesthesia Postprocedure Evaluation (Signed)
Anesthesia Post Note  Patient: Jody [name]  Procedure(s) Performed: CARDIOVERSION (N/A )  Patient location during evaluation: Specials Recovery Anesthesia Type: General Level of consciousness: awake and alert Pain management: pain level controlled Vital Signs Assessment: post-procedure vital signs reviewed and stable Respiratory status: spontaneous breathing, nonlabored ventilation, respiratory function stable and patient connected to nasal cannula oxygen Cardiovascular status: blood pressure returned to baseline and stable Postop Assessment: no apparent nausea or vomiting Anesthetic complications: no   No complications documented.   Last Vitals:  Vitals:   09/24/20 0800 09/24/20 0801  BP:  (!) 126/59  Pulse: (!) 52 (!) 55  Resp: 20 (!) 25  Temp:    SpO2: 100% 100%    Last Pain:  Vitals:   09/24/20 0733  TempSrc: Oral  PainSc: 0-No pain                 Arita Miss

## 2020-09-24 NOTE — Transfer of Care (Signed)
Immediate Anesthesia Transfer of Care Note  Patient: Jody [name]  Procedure(s) Performed: CARDIOVERSION (N/A )  Patient Location: Radiology  Anesthesia Type:General  Level of Consciousness: drowsy and patient cooperative  Airway & Oxygen Therapy: Patient Spontanous Breathing and Patient connected to face mask oxygen  Post-op Assessment: Report given to RN and Post -op Vital signs reviewed and stable  Post vital signs: Reviewed and stable  Last Vitals:  Vitals Value Taken Time  BP 126/59 09/24/20 0801  Temp    Pulse 52 09/24/20 0809  Resp 25 09/24/20 0809  SpO2 100 % 09/24/20 0809  Vitals shown include unvalidated device data.  Last Pain:  Vitals:   09/24/20 0733  TempSrc: Oral  PainSc: 0-No pain         Complications: No complications documented.

## 2020-09-24 NOTE — Anesthesia Procedure Notes (Signed)
Procedure Name: General with mask airway Performed by: Kelton Pillar, CRNA Pre-anesthesia Checklist: Patient identified, Emergency Drugs available, Suction available, Patient being monitored and Timeout performed Patient Re-evaluated:Patient Re-evaluated prior to induction Oxygen Delivery Method: Simple face mask Induction Type: IV induction Placement Confirmation: positive ETCO2 and CO2 detector Dental Injury: Teeth and Oropharynx as per pre-operative assessment

## 2020-09-24 NOTE — Anesthesia Preprocedure Evaluation (Signed)
Anesthesia Evaluation  Patient identified by MRN, date of birth, ID band Patient awake and Patient confused    Reviewed: Allergy & Precautions, NPO status , Patient's Chart, lab work & pertinent test results, reviewed documented beta blocker date and time   History of Anesthesia Complications Negative for: history of anesthetic complications  Airway Mallampati: II  TM Distance: >3 FB Neck ROM: Limited    Dental  (+) Teeth Intact   Pulmonary asthma , neg sleep apnea, neg COPD, Patient abstained from smoking.Not current smoker,   RUL lung cancer   breath sounds clear to auscultation       Cardiovascular Exercise Tolerance: Poor METShypertension, (-) angina+CHF and + DOE  (-) CAD and (-) Past MI + dysrhythmias (on Eliquis) Atrial Fibrillation + Valvular Problems/Murmurs MR  Rhythm:Irregular Rate:Tachycardia   TTE (12/2019): LVEF 40-45% WITH MILD LVH  MODERATE MR, TR  MILD AR, PR    Neuro/Psych PSYCHIATRIC DISORDERS Anxiety Depression Dementia negative neurological ROS     GI/Hepatic GERD  Controlled,(+)     (-) substance abuse  ,  Diverticulosis c/b perforation s/p colostomy & reversal   Endo/Other  diabetes (Pre-DM)  Renal/GU negative Renal ROS     Musculoskeletal   Abdominal   Peds  Hematology   Anesthesia Other Findings Past Medical History: No date: Asthma No date: Atrial fibrillation (HCC) No date: CHF (congestive heart failure) (HCC) No date: Complication of anesthesia     Comment:  Slow to wake after colostomy No date: Dysrhythmia No date: GERD (gastroesophageal reflux disease) No date: Hypertension No date: Memory difficulties 02/2005: Primary adenocarcinoma of right lung ([place])     Comment:  Rad tx's.  No date: Uses walker  Reproductive/Obstetrics                            Anesthesia Physical  Anesthesia Plan  ASA: III  Anesthesia Plan: General   Post-op Pain  Management:    Induction: Intravenous  PONV Risk Score and Plan: 3 and Ondansetron, Propofol infusion and TIVA  Airway Management Planned: Nasal Cannula  Additional Equipment: None  Intra-op Plan:   Post-operative Plan:   Informed Consent: I have reviewed the patients History and Physical, chart, labs and discussed the procedure including the risks, benefits and alternatives for the proposed anesthesia with the patient or authorized representative who has indicated his/her understanding and acceptance.     Dental advisory given and Consent reviewed with POA  Plan Discussed with: CRNA and Surgeon  Anesthesia Plan Comments: (Discussed risks of anesthesia with husband at bedside (Due to patient's dementia and lack of capacity to consent), including possibility of difficulty with spontaneous ventilation under anesthesia necessitating airway intervention, PONV, and rare risks such as cardiac or respiratory or neurological events. Husband understands.)        Anesthesia Quick Evaluation

## 2020-09-24 NOTE — CV Procedure (Signed)
Electrical Cardioversion Procedure Note Jody [name] [id number] [birth year]  Procedure: Electrical Cardioversion Indications:  Paroxysmal non valvular atrial fibrillation  Procedure Details Consent: Risks of procedure as well as the alternatives and risks of each were explained to the (patient/caregiver).  Consent for procedure obtained. Time Out: Verified patient identification, verified procedure, site/side was marked, verified correct patient position, special equipment/implants available, medications/allergies/relevent history reviewed, required imaging and test results available.  Performed  Patient placed on cardiac monitor, pulse oximetry, supplemental oxygen as necessary.  Sedation given: Propofol and versed as per anesthesia  Pacer pads placed anterior and posterior chest.  Cardioverted 1 time(s).  Cardioverted at 120J.  Evaluation Findings: Post procedure EKG shows: NSR Complications: None Patient did tolerate procedure well.   [name] M.D. [facility] 09/24/2020, 8:01 AM

## 2020-09-25 ENCOUNTER — Encounter: Payer: Self-pay | Admitting: Internal Medicine

## 2020-10-14 ENCOUNTER — Inpatient Hospital Stay
Admission: EM | Admit: 2020-10-14 | Discharge: 2020-10-18 | DRG: 682 | Disposition: A | Discharge status: Skilled Nursing Facility | Payer: Medicare Other | Attending: Internal Medicine | Admitting: Internal Medicine

## 2020-10-14 ENCOUNTER — Emergency Department: Payer: Medicare Other

## 2020-10-14 DIAGNOSIS — I482 Chronic atrial fibrillation, unspecified: Secondary | ICD-10-CM | POA: Diagnosis present

## 2020-10-14 DIAGNOSIS — E878 Other disorders of electrolyte and fluid balance, not elsewhere classified: Secondary | ICD-10-CM | POA: Diagnosis present

## 2020-10-14 DIAGNOSIS — I081 Rheumatic disorders of both mitral and tricuspid valves: Secondary | ICD-10-CM | POA: Diagnosis present

## 2020-10-14 DIAGNOSIS — F0280 Dementia in other diseases classified elsewhere without behavioral disturbance: Secondary | ICD-10-CM | POA: Diagnosis present

## 2020-10-14 DIAGNOSIS — K219 Gastro-esophageal reflux disease without esophagitis: Secondary | ICD-10-CM | POA: Diagnosis present

## 2020-10-14 DIAGNOSIS — E785 Hyperlipidemia, unspecified: Secondary | ICD-10-CM | POA: Diagnosis present

## 2020-10-14 DIAGNOSIS — I5042 Chronic combined systolic (congestive) and diastolic (congestive) heart failure: Secondary | ICD-10-CM | POA: Diagnosis present

## 2020-10-14 DIAGNOSIS — I4891 Unspecified atrial fibrillation: Secondary | ICD-10-CM | POA: Diagnosis present

## 2020-10-14 DIAGNOSIS — J690 Pneumonitis due to inhalation of food and vomit: Secondary | ICD-10-CM | POA: Diagnosis present

## 2020-10-14 DIAGNOSIS — F0390 Unspecified dementia without behavioral disturbance: Secondary | ICD-10-CM

## 2020-10-14 DIAGNOSIS — I480 Paroxysmal atrial fibrillation: Secondary | ICD-10-CM

## 2020-10-14 DIAGNOSIS — Z20822 Contact with and (suspected) exposure to covid-19: Secondary | ICD-10-CM | POA: Diagnosis present

## 2020-10-14 DIAGNOSIS — Z7901 Long term (current) use of anticoagulants: Secondary | ICD-10-CM

## 2020-10-14 DIAGNOSIS — Z7983 Long term (current) use of bisphosphonates: Secondary | ICD-10-CM

## 2020-10-14 DIAGNOSIS — G309 Alzheimer's disease, unspecified: Secondary | ICD-10-CM | POA: Diagnosis present

## 2020-10-14 DIAGNOSIS — Z923 Personal history of irradiation: Secondary | ICD-10-CM

## 2020-10-14 DIAGNOSIS — F32A Depression, unspecified: Secondary | ICD-10-CM | POA: Diagnosis present

## 2020-10-14 DIAGNOSIS — I130 Hypertensive heart and chronic kidney disease with heart failure and stage 1 through stage 4 chronic kidney disease, or unspecified chronic kidney disease: Secondary | ICD-10-CM | POA: Diagnosis present

## 2020-10-14 DIAGNOSIS — R627 Adult failure to thrive: Secondary | ICD-10-CM | POA: Diagnosis present

## 2020-10-14 DIAGNOSIS — R54 Age-related physical debility: Secondary | ICD-10-CM | POA: Diagnosis present

## 2020-10-14 DIAGNOSIS — Z888 Allergy status to other drugs, medicaments and biological substances status: Secondary | ICD-10-CM

## 2020-10-14 DIAGNOSIS — E871 Hypo-osmolality and hyponatremia: Secondary | ICD-10-CM

## 2020-10-14 DIAGNOSIS — Z79899 Other long term (current) drug therapy: Secondary | ICD-10-CM

## 2020-10-14 DIAGNOSIS — R531 Weakness: Secondary | ICD-10-CM

## 2020-10-14 DIAGNOSIS — C3490 Malignant neoplasm of unspecified part of unspecified bronchus or lung: Secondary | ICD-10-CM

## 2020-10-14 DIAGNOSIS — J181 Lobar pneumonia, unspecified organism: Secondary | ICD-10-CM

## 2020-10-14 DIAGNOSIS — K449 Diaphragmatic hernia without obstruction or gangrene: Secondary | ICD-10-CM | POA: Diagnosis present

## 2020-10-14 DIAGNOSIS — N179 Acute kidney failure, unspecified: Secondary | ICD-10-CM | POA: Diagnosis not present

## 2020-10-14 DIAGNOSIS — Z6825 Body mass index (BMI) 25.0-25.9, adult: Secondary | ICD-10-CM

## 2020-10-14 DIAGNOSIS — Z880 Allergy status to penicillin: Secondary | ICD-10-CM

## 2020-10-14 DIAGNOSIS — F419 Anxiety disorder, unspecified: Secondary | ICD-10-CM | POA: Diagnosis present

## 2020-10-14 DIAGNOSIS — R55 Syncope and collapse: Secondary | ICD-10-CM

## 2020-10-14 DIAGNOSIS — Z803 Family history of malignant neoplasm of breast: Secondary | ICD-10-CM

## 2020-10-14 DIAGNOSIS — R630 Anorexia: Secondary | ICD-10-CM | POA: Diagnosis present

## 2020-10-14 DIAGNOSIS — R1110 Vomiting, unspecified: Secondary | ICD-10-CM | POA: Diagnosis present

## 2020-10-14 DIAGNOSIS — E876 Hypokalemia: Secondary | ICD-10-CM

## 2020-10-14 DIAGNOSIS — Z85118 Personal history of other malignant neoplasm of bronchus and lung: Secondary | ICD-10-CM

## 2020-10-14 DIAGNOSIS — E1122 Type 2 diabetes mellitus with diabetic chronic kidney disease: Secondary | ICD-10-CM | POA: Diagnosis present

## 2020-10-14 DIAGNOSIS — N182 Chronic kidney disease, stage 2 (mild): Secondary | ICD-10-CM | POA: Diagnosis present

## 2020-10-14 LAB — CBC
HCT: 42.3 % (ref 36.0–46.0)
Hemoglobin: 15 g/dL (ref 12.0–15.0)
MCH: 28.5 pg (ref 26.0–34.0)
MCHC: 35.5 g/dL (ref 30.0–36.0)
MCV: 80.4 fL (ref 80.0–100.0)
Platelets: 333 10*3/uL (ref 150–400)
RBC: 5.26 MIL/uL — ABNORMAL HIGH (ref 3.87–5.11)
RDW: 15.6 % — ABNORMAL HIGH (ref 11.5–15.5)
WBC: 12.3 10*3/uL — ABNORMAL HIGH (ref 4.0–10.5)
nRBC: 0 % (ref 0.0–0.2)

## 2020-10-14 LAB — BASIC METABOLIC PANEL
Anion gap: 14 (ref 5–15)
BUN: 16 mg/dL (ref 8–23)
CO2: 20 mmol/L — ABNORMAL LOW (ref 22–32)
Calcium: 8 mg/dL — ABNORMAL LOW (ref 8.9–10.3)
Chloride: 93 mmol/L — ABNORMAL LOW (ref 98–111)
Creatinine, Ser: 1.47 mg/dL — ABNORMAL HIGH (ref 0.44–1.00)
GFR, Estimated: 35 mL/min — ABNORMAL LOW (ref >60)
Glucose, Bld: 112 mg/dL — ABNORMAL HIGH (ref 70–99)
Potassium: 2.5 mmol/L — CL (ref 3.5–5.1)
Sodium: 127 mmol/L — ABNORMAL LOW (ref 135–145)

## 2020-10-14 LAB — CBG MONITORING, ED: Glucose-Capillary: 102 mg/dL — ABNORMAL HIGH (ref 70–99)

## 2020-10-14 LAB — T4, FREE: Free T4: 2.09 ng/dL — ABNORMAL HIGH (ref 0.61–1.12)

## 2020-10-14 LAB — TROPONIN I (HIGH SENSITIVITY): Troponin I (High Sensitivity): 30 ng/L — ABNORMAL HIGH (ref <18)

## 2020-10-14 LAB — TSH: TSH: 6.338 u[IU]/mL — ABNORMAL HIGH (ref 0.350–4.500)

## 2020-10-14 MED ORDER — DILTIAZEM HCL 25 MG/5ML IV SOLN
10.0000 mg | Freq: Once | INTRAVENOUS | Status: AC
  Administered 2020-10-14: 10 mg via INTRAVENOUS
  Filled 2020-10-14: qty 5

## 2020-10-14 MED ORDER — POTASSIUM CHLORIDE CRYS ER 20 MEQ PO TBCR
60.0000 meq | EXTENDED_RELEASE_TABLET | Freq: Once | ORAL | Status: AC
  Administered 2020-10-15: 60 meq via ORAL
  Filled 2020-10-14: qty 3

## 2020-10-14 MED ORDER — CALCIUM GLUCONATE 10 % IV SOLN
1.0000 g | Freq: Once | INTRAVENOUS | Status: AC
  Administered 2020-10-14: 1 g via INTRAVENOUS
  Filled 2020-10-14: qty 10

## 2020-10-14 NOTE — ED Notes (Signed)
Pt presenting to ED after syncopal episode, husband caught her. Pt showing afib on monitor, HR 140s. Pt's husband states she was recently seen and treated for afib. Pt denies pain

## 2020-10-14 NOTE — ED Notes (Signed)
EKG given to [facility] MD

## 2020-10-14 NOTE — ED Triage Notes (Signed)
Pt to ED from home via EMS for syncope, reports she had syncopal episode while on the toilet and was caught by her husband. Denies any injury or falling to the ground.  Pt in A. Fib for EMS, rate 130-140. Pt denies Nausea, SOB, or chest pain. Pt recently seen here for a.fib and discharged 4/20.

## 2020-10-14 NOTE — ED Provider Notes (Incomplete)
[facility] Emergency Department Provider Note   ____________________________________________   Event Date/Time   First MD Initiated Contact with Patient 10/14/20 2307     (approximate)  I have reviewed the triage vital signs and the nursing notes.   HISTORY  Chief Complaint Loss of Consciousness and Abnormal ECG    HPI Jody [name] is a 80 y.o. female brought to the ED via EMS from home with a chief complaint of syncope.  Husband was helping patient from the toilet after urination back into her wheelchair when she collapsed and had a syncopal episode lasting 2 to 3 minutes.  Husband was able to get her into the wheelchair so she did not strike her head.  History of atrial fibrillation on metoprolol and Eliquis.  Arrives to the ED in atrial fibrillation with RVR.  Spouse states patient vomited x1 after syncopal episode.  Patient just states she felt funny and lightheaded.  Reports she was diaphoretic and short of breath after the episode.  Denies recent fever, cough, chest pain, abdominal pain, dysuria or diarrhea.     Past Medical History:  Diagnosis Date  . Asthma   . Atrial fibrillation ([place])   . CHF (congestive heart failure) ([place])   . 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 Dysrhythmia   . GERD (gastroesophageal reflux disease)   . Hypertension   . Memory difficulties   . Primary adenocarcinoma of right lung ([place]) 02/2005   Rad tx's.   . Uses walker     Patient Active Problem List   Diagnosis Date Noted  . Acute on chronic congestive heart failure ([place])   . Pleural effusion on right   . Alzheimer's dementia without behavioral disturbance ([place])   . Atrial fibrillation with rapid ventricular response ([place]) 08/29/2020  . Chronic a-fib ([place]) 11/28/2018  . History of colostomy reversal 11/28/2018  . Perforation bowel ([place])   . Poor appetite   . Acute delirium   . Goals of care, counseling/discussion   . Palliative  care encounter   . Diverticulitis of colon with perforation 12/27/2017  . Atrial fibrillation with RVR ([place]) 12/27/2017  . Malignant neoplasm of upper lobe of right lung ([place]) 12/31/2015  . 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 TI (tricuspid incompetence) 01/21/2015  . Anxiety 01/13/2015  . Clinical depression 01/13/2015  . 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 Borderline diabetes mellitus 01/13/2015  . Benign essential HTN 01/06/2015  . Essential (primary) hypertension 01/06/2015  . Atrial fibrillation, chronic ([place]) 05/20/2014  . 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 Chronic atrial fibrillation ([place]) 05/20/2014  . 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 Cough 11/20/2013  . Breath shortness 11/01/2013  . Breathlessness on exertion 10/30/2013    Past Surgical History:  Procedure Laterality Date  . CARDIOVERSION N/A 09/24/2020   Procedure: CARDIOVERSION;  Surgeon: Corey Skains, MD;  Location: [facility] ORS;  Service: Cardiovascular;  Laterality: N/A;  . CATARACT EXTRACTION W/PHACO Left 04/29/2020   Procedure: CATARACT EXTRACTION PHACO AND INTRAOCULAR LENS PLACEMENT (IOC) LEFT 17.47 01:28.8;  Surgeon: Birder Robson, MD;  Location: [place];  Service: Ophthalmology;  Laterality: Left;  . CATARACT EXTRACTION W/PHACO Right 05/20/2020   Procedure: CATARACT EXTRACTION PHACO AND INTRAOCULAR LENS PLACEMENT (IOC) RIGHT 14.89 01:13.8;  Surgeon: Birder Robson, MD;  Location: [place];  Service: Ophthalmology;  Laterality: Right;  . COLOSTOMY N/A 01/05/2018   Procedure: COLOSTOMY;  Surgeon: Benjamine Sprague, DO;  Location: [facility] ORS;  Service: General;  Laterality: N/A;  . COLOSTOMY TAKEDOWN N/A 11/28/2018   Procedure: COLOSTOMY TAKEDOWN, DIABETIC;  Surgeon: Benjamine Sprague, DO;  Location: [facility] ORS;  Service: General;  Laterality: N/A;  . LAPAROTOMY N/A 01/05/2018   Procedure: EXPLORATORY LAPAROTOMY/ POSSIBLE BOWEL OBSTRUCTION;  Surgeon: Benjamine Sprague, DO;   Location: [facility] OR[facility];  Service: General;  Laterality: N/A;  . LYSIS OF ADHESION N/A 01/05/2018   Procedure: LYSIS OF ADHESION;  Surgeon: Benjamine Sprague, DO;  Location: [facility] ORS;  Service: General;  Laterality: N/A;    Prior to Admission medications   Medication Sig Start Date End Date Taking? Authorizing Provider  acetaminophen (TYLENOL) 325 MG tablet Take 2 tablets (650 mg total) by mouth every 6 (six) hours as needed (fever > 101). Patient taking differently: Take 325 mg by mouth every 6 (six) hours as needed for moderate pain. 12/03/18   Olean Ree, MD  albuterol (PROVENTIL) (2.5 MG/3ML) 0.083% nebulizer solution Take 2.5 mg by nebulization every 6 (six) hours as needed for wheezing or shortness of breath.    [provider]  albuterol (VENTOLIN HFA) 108 (90 Base) MCG/ACT inhaler Inhale 2 puffs into the lungs every 6 (six) hours as needed for wheezing or shortness of breath.     [provider]  alendronate (FOSAMAX) 70 MG tablet Take 70 mg by mouth every Saturday.    [provider]  ALPRAZolam Duanne Moron) 0.5 MG tablet Take 0.5 mg by mouth at bedtime.    [provider]  amiodarone (PACERONE) 200 MG tablet Take 1 tablet (200 mg total) by mouth 2 (two) times daily. 09/01/20   Fritzi Mandes, MD  apixaban (ELIQUIS) 2.5 MG TABS tablet Take 1 tablet (2.5 mg total) by mouth 2 (two) times daily. 09/01/20   Fritzi Mandes, MD  Calcium Carbonate-Vitamin D 600-200 MG-UNIT TABS Take 1 tablet by mouth daily.    [provider]  digoxin (LANOXIN) 0.125 MG tablet Take 1 tablet (0.125 mg total) by mouth daily. 01/22/18   Gladstone Lighter, MD  FLUoxetine (PROZAC) 40 MG capsule Take 40 mg by mouth daily.    [provider]  furosemide (LASIX) 20 MG tablet Take 20 mg by mouth daily.    [provider]  lovastatin (MEVACOR) 20 MG tablet Take 20 mg by mouth daily with supper.     [provider]  megestrol (MEGACE ES) 625 MG/5ML suspension Take 625 mg by  mouth daily. 08/21/20   [provider]  memantine (NAMENDA) 5 MG tablet Take 5 mg by mouth 2 (two) times daily. 08/23/20   [provider]  metoprolol succinate (TOPROL XL) 50 MG 24 hr tablet Take 1 tablet (50 mg total) by mouth in the morning and at bedtime. Hold if BP is <120 and HR<70 09/01/20   Fritzi Mandes, MD  Multiple Vitamin (MULTI-VITAMINS) TABS Take 1 tablet by mouth daily.     [provider]  omeprazole (PRILOSEC) 20 MG capsule Take 20 mg by mouth in the morning and at bedtime.  08/15/14   [provider]  traMADol (ULTRAM) 50 MG tablet Take 50 mg by mouth every 6 (six) hours as needed for moderate pain.    [provider]  traZODone (DESYREL) 50 MG tablet Take 50 mg by mouth at bedtime.    [provider]    Allergies Donepezil, Poison oak extract, Ampicillin, and Penicillin v potassium  Family History  Problem Relation Age of Onset  . Breast cancer Paternal Aunt     Social  History Social History   Tobacco Use  . Smoking status: Never Smoker  . Smokeless tobacco: Never Used  Vaping Use  . Vaping Use: Never used  Substance Use Topics  . Alcohol use: No  . Drug use: Never    Review of Systems  Constitutional: No fever/chills Eyes: No visual changes. ENT: No sore throat. Cardiovascular: Positive for palpitations.  Denies chest pain. Respiratory: Positive for shortness of breath. Gastrointestinal: No abdominal pain.  Positive for vomiting.  No diarrhea.  No constipation. Genitourinary: Negative for dysuria. Musculoskeletal: Negative for back pain. Skin: Negative for rash. Neurological: Positive for syncope.  Negative for headaches, focal weakness or numbness.   ____________________________________________   PHYSICAL EXAM:  VITAL SIGNS: ED Triage Vitals  Enc Vitals Group     BP 10/14/20 2231 115/73     Pulse Rate 10/14/20 2231 (!) 151     Resp 10/14/20 2231 18     Temp 10/14/20 2231 97.6 F (36.4 C)      Temp Source 10/14/20 2231 Oral     SpO2 10/14/20 2231 97 %     Weight 10/14/20 2232 126 lb 15.8 oz (57.6 kg)     Height 10/14/20 2232 4\' 11"  (1.499 m)     Head Circumference --      Peak Flow --      Pain Score 10/14/20 2232 0     Pain Loc --      Pain Edu? --      Excl. in [place]? --     Constitutional: Alert and oriented.  Elderly appearing and in mild acute distress. Eyes: Conjunctivae are normal. PERRL. EOMI. Head: Atraumatic. Nose: No congestion/rhinnorhea. Mouth/Throat: Mucous membranes are mildly dry. Neck: No stridor.   Cardiovascular: Tachycardic rate, irregular rhythm. Grossly normal heart sounds.  Good peripheral circulation. Respiratory: Increased respiratory effort.  No retractions. Lungs CTAB. Gastrointestinal: Soft and nontender to light or deep palpation. No distention. No abdominal bruits. No CVA tenderness. Musculoskeletal: No lower extremity tenderness nor edema.  No joint effusions. Neurologic:  Normal speech and language. No gross focal neurologic deficits are appreciated.  Skin:  Skin is warm, dry and intact. No rash noted. Psychiatric: Mood and affect are normal. Speech and behavior are normal.  ____________________________________________   LABS (all labs ordered are listed, but only abnormal results are displayed)  Labs Reviewed  BASIC METABOLIC PANEL - Abnormal; Notable for the following components:      Result Value   Sodium 127 (*)    Potassium 2.5 (*)    Chloride 93 (*)    CO2 20 (*)    Glucose, Bld 112 (*)    Creatinine, Ser 1.47 (*)    Calcium 8.0 (*)    GFR, Estimated 35 (*)    All other components within normal limits  CBC - Abnormal; Notable for the following components:   WBC 12.3 (*)    RBC 5.26 (*)    RDW 15.6 (*)    All other components within normal limits  CBG MONITORING, ED - Abnormal; Notable for the following components:   Glucose-Capillary 102 (*)    All other components within normal limits  RESP PANEL BY RT-PCR (FLU A&B,  COVID) ARPGX2  URINALYSIS, COMPLETE (UACMP) WITH MICROSCOPIC  BRAIN NATRIURETIC PEPTIDE  TSH  T4, FREE  TROPONIN I (HIGH SENSITIVITY)   ____________________________________________  EKG  ED ECG REPORT I, Katrenia Alkins J, the attending physician, personally viewed and interpreted this ECG.   Date: 10/14/2020  EKG Time: 2234  Rate:  151  Rhythm: atrial fibrillation, rate 151  Axis: Normal  Intervals:none  ST&T Change: Nonspecific  ____________________________________________  RADIOLOGY I, Curstin Schmale J, personally viewed and evaluated these images (plain radiographs) as part of my medical decision making, as well as reviewing the written report by the radiologist.  ED MD interpretation: No acute cardiopulmonary process  Official radiology report(s): DG Chest Port 1 View  Result Date: 10/14/2020 CLINICAL DATA:  Recent syncopal episode EXAM: PORTABLE CHEST 1 VIEW COMPARISON:  08/29/2020 FINDINGS: Cardiac shadow is at the upper limits of normal in size. Aortic calcifications are noted. Previously seen right-sided pleural effusion has resolved in the interval from the prior exam. Previously seen vascular congestion has improved significantly when compare with the prior exam. There are some areas of increased density in the right suprahilar region consistent with prior radiation therapy and scarring. This is stable from multiple previous CTs dating back to at least 2020. No focal infiltrate or effusion is noted. IMPRESSION: Resolution of previously seen right-sided pleural effusion. Post radiation changes in the right suprahilar region. No acute abnormality noted. Electronically Signed   By: Inez Catalina M.D.   On: 10/14/2020 23:31    ____________________________________________   PROCEDURES  Procedure(s) performed (including Critical Care):  .1-3 Lead EKG Interpretation Performed by: Paulette Blanch, MD Authorized by: Paulette Blanch, MD     Interpretation: abnormal     ECG rate:  151    ECG rate assessment: tachycardic     Rhythm: atrial fibrillation     Ectopy: none     Conduction: normal   Comments:     Patient placed on cardiac monitor to evaluate for arrhythmias    CRITICAL CARE Performed by: Paulette Blanch   Total critical care time: *** minutes  Critical care time was exclusive of separately billable procedures and treating other patients.  Critical care was necessary to treat or prevent imminent or life-threatening deterioration.  Critical care was time spent personally by me on the following activities: development of treatment plan with patient and/or surrogate as well as nursing, discussions with consultants, evaluation of patient's response to treatment, examination of patient, obtaining history from patient or surrogate, ordering and performing treatments and interventions, ordering and review of laboratory studies, ordering and review of radiographic studies, pulse oximetry and re-evaluation of patient's condition.  ____________________________________________   INITIAL IMPRESSION / ASSESSMENT AND PLAN / ED COURSE  As part of my medical decision making, I reviewed the following data within the [place] History obtained from family, Nursing notes reviewed and incorporated, Labs reviewed, EKG interpreted, Old chart reviewed, Radiograph reviewed, Discussed with admitting physician and Notes from prior ED visits     80 year old female presenting for syncope, atrial fibrillation in RVR. Differential diagnosis includes, but is not limited to, ACS, aortic dissection, pulmonary embolism, cardiac tamponade, pneumothorax, pneumonia, pericarditis, myocarditis, GI-related causes including esophagitis/gastritis, and musculoskeletal chest wall pain.    Will obtain cardiac work-up, chest x-ray, CT head.  Blood pressure is marginal; will administer calcium gluconate prior to IV diltiazem bolus for rate control.  Anticipate hospitalization.       ____________________________________________   FINAL CLINICAL IMPRESSION(S) / ED DIAGNOSES  Final diagnoses:  Hypokalemia  Syncope, unspecified syncope type  Atrial fibrillation with rapid ventricular response [facility])     ED Discharge Orders    None      *Please note:  CELLA CAPPELLO was evaluated in Emergency Department on 10/14/2020 for the symptoms described in the history of present  illness. She was evaluated in the context of the global COVID-19 pandemic, which necessitated consideration that the patient might be at risk for infection with the SARS-CoV-2 virus that causes COVID-19. Institutional protocols and algorithms that pertain to the evaluation of patients at risk for COVID-19 are in a state of rapid change based on information released by regulatory bodies including the CDC and federal and state organizations. These policies and algorithms were followed during the patient's care in the ED.  Some ED evaluations and interventions may be delayed as a result of limited staffing during and the pandemic.*   Note:  This document was prepared using Dragon voice recognition software and may include unintentional dictation errors.

## 2020-10-14 NOTE — ED Provider Notes (Signed)
[facility] Emergency Department Provider Note   ____________________________________________   Event Date/Time   First MD Initiated Contact with Patient 10/14/20 2307     (approximate)  I have reviewed the triage vital signs and the nursing notes.   HISTORY  Chief Complaint Loss of Consciousness and Abnormal ECG    HPI Jody [name] is a 80 y.o. female brought to the ED via EMS from home with a chief complaint of syncope.  Husband was helping patient from the toilet after urination back into her wheelchair when she collapsed and had a syncopal episode lasting 2 to 3 minutes.  Husband was able to get her into the wheelchair so she did not strike her head.  History of atrial fibrillation on metoprolol and Eliquis.  Arrives to the ED in atrial fibrillation with RVR.  Spouse states patient vomited x1 after syncopal episode.  Patient just states she felt funny and lightheaded.  Reports she was diaphoretic and short of breath after the episode.  Denies recent fever, cough, chest pain, abdominal pain, dysuria or diarrhea.     Past Medical History:  Diagnosis Date  . Asthma   . Atrial fibrillation ([place])   . CHF (congestive heart failure) ([place])   . 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 Dysrhythmia   . GERD (gastroesophageal reflux disease)   . Hypertension   . Memory difficulties   . Primary adenocarcinoma of right lung ([place]) 02/2005   Rad tx's.   . Uses walker     Patient Active Problem List   Diagnosis Date Noted  . Acute on chronic congestive heart failure ([place])   . Pleural effusion on right   . Alzheimer's dementia without behavioral disturbance ([place])   . Atrial fibrillation with rapid ventricular response ([place]) 08/29/2020  . Chronic a-fib ([place]) 11/28/2018  . History of colostomy reversal 11/28/2018  . Perforation bowel ([place])   . Poor appetite   . Acute delirium   . Goals of care, counseling/discussion   . Palliative  care encounter   . Diverticulitis of colon with perforation 12/27/2017  . Atrial fibrillation with RVR ([place]) 12/27/2017  . Malignant neoplasm of upper lobe of right lung ([place]) 12/31/2015  . 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 TI (tricuspid incompetence) 01/21/2015  . Anxiety 01/13/2015  . Clinical depression 01/13/2015  . 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 Borderline diabetes mellitus 01/13/2015  . Benign essential HTN 01/06/2015  . Essential (primary) hypertension 01/06/2015  . Atrial fibrillation, chronic ([place]) 05/20/2014  . 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 Chronic atrial fibrillation ([place]) 05/20/2014  . 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 Cough 11/20/2013  . Breath shortness 11/01/2013  . Breathlessness on exertion 10/30/2013    Past Surgical History:  Procedure Laterality Date  . CARDIOVERSION N/A 09/24/2020   Procedure: CARDIOVERSION;  Surgeon: Corey Skains, MD;  Location: [facility];  Service: Cardiovascular;  Laterality: N/A;  . CATARACT EXTRACTION W/PHACO Left 04/29/2020   Procedure: CATARACT EXTRACTION PHACO AND INTRAOCULAR LENS PLACEMENT (IOC) LEFT 17.47 01:28.8;  Surgeon: Birder Robson, MD;  Location: [place];  Service: Ophthalmology;  Laterality: Left;  . CATARACT EXTRACTION W/PHACO Right 05/20/2020   Procedure: CATARACT EXTRACTION PHACO AND INTRAOCULAR LENS PLACEMENT (IOC) RIGHT 14.89 01:13.8;  Surgeon: Birder Robson, MD;  Location: [place];  Service: Ophthalmology;  Laterality: Right;  . COLOSTOMY N/A 01/05/2018   Procedure: COLOSTOMY;  Surgeon: Benjamine Sprague, DO;  Location: [facility];  Service: General;  Laterality: N/A;  . COLOSTOMY TAKEDOWN N/A 11/28/2018   Procedure: COLOSTOMY TAKEDOWN, DIABETIC;  Surgeon: Benjamine Sprague, DO;  Location: [facility];  Service: General;  Laterality: N/A;  . LAPAROTOMY N/A 01/05/2018   Procedure: EXPLORATORY LAPAROTOMY/ POSSIBLE BOWEL OBSTRUCTION;  Surgeon: Benjamine Sprague, DO;   Location: [facility];  Service: General;  Laterality: N/A;  . LYSIS OF ADHESION N/A 01/05/2018   Procedure: LYSIS OF ADHESION;  Surgeon: Benjamine Sprague, DO;  Location: [facility];  Service: General;  Laterality: N/A;    Prior to Admission medications   Medication Sig Start Date End Date Taking? Authorizing Provider  acetaminophen (TYLENOL) 325 MG tablet Take 2 tablets (650 mg total) by mouth every 6 (six) hours as needed (fever > 101). Patient taking differently: Take 325 mg by mouth every 6 (six) hours as needed for moderate pain. 12/03/18   Olean Ree, MD  albuterol (PROVENTIL) (2.5 MG/3ML) 0.083% nebulizer solution Take 2.5 mg by nebulization every 6 (six) hours as needed for wheezing or shortness of breath.    [provider]  albuterol (VENTOLIN HFA) 108 (90 Base) MCG/ACT inhaler Inhale 2 puffs into the lungs every 6 (six) hours as needed for wheezing or shortness of breath.     [provider]  alendronate (FOSAMAX) 70 MG tablet Take 70 mg by mouth every Saturday.    [provider]  ALPRAZolam Duanne Moron) 0.5 MG tablet Take 0.5 mg by mouth at bedtime.    [provider]  amiodarone (PACERONE) 200 MG tablet Take 1 tablet (200 mg total) by mouth 2 (two) times daily. 09/01/20   Fritzi Mandes, MD  apixaban (ELIQUIS) 2.5 MG TABS tablet Take 1 tablet (2.5 mg total) by mouth 2 (two) times daily. 09/01/20   Fritzi Mandes, MD  Calcium Carbonate-Vitamin D 600-200 MG-UNIT TABS Take 1 tablet by mouth daily.    [provider]  digoxin (LANOXIN) 0.125 MG tablet Take 1 tablet (0.125 mg total) by mouth daily. 01/22/18   Gladstone Lighter, MD  FLUoxetine (PROZAC) 40 MG capsule Take 40 mg by mouth daily.    [provider]  furosemide (LASIX) 20 MG tablet Take 20 mg by mouth daily.    [provider]  lovastatin (MEVACOR) 20 MG tablet Take 20 mg by mouth daily with supper.     [provider]  megestrol (MEGACE ES) 625 MG/5ML suspension Take 625 mg by  mouth daily. 08/21/20   [provider]  memantine (NAMENDA) 5 MG tablet Take 5 mg by mouth 2 (two) times daily. 08/23/20   [provider]  metoprolol succinate (TOPROL XL) 50 MG 24 hr tablet Take 1 tablet (50 mg total) by mouth in the morning and at bedtime. Hold if BP is <120 and HR<70 09/01/20   Fritzi Mandes, MD  Multiple Vitamin (MULTI-VITAMINS) TABS Take 1 tablet by mouth daily.     [provider]  omeprazole (PRILOSEC) 20 MG capsule Take 20 mg by mouth in the morning and at bedtime.  08/15/14   [provider]  traMADol (ULTRAM) 50 MG tablet Take 50 mg by mouth every 6 (six) hours as needed for moderate pain.    [provider]  traZODone (DESYREL) 50 MG tablet Take 50 mg by mouth at bedtime.    [provider]    Allergies Donepezil, Poison oak extract, Ampicillin, and Penicillin v potassium  Family History  Problem Relation Age of Onset  . Breast cancer Paternal Aunt     Social  History Social History   Tobacco Use  . Smoking status: Never Smoker  . Smokeless tobacco: Never Used  Vaping Use  . Vaping Use: Never used  Substance Use Topics  . Alcohol use: No  . Drug use: Never    Review of Systems  Constitutional: No fever/chills Eyes: No visual changes. ENT: No sore throat. Cardiovascular: Positive for palpitations.  Denies chest pain. Respiratory: Positive for shortness of breath. Gastrointestinal: No abdominal pain.  Positive for vomiting.  No diarrhea.  No constipation. Genitourinary: Negative for dysuria. Musculoskeletal: Negative for back pain. Skin: Negative for rash. Neurological: Positive for syncope.  Negative for headaches, focal weakness or numbness.   ____________________________________________   PHYSICAL EXAM:  VITAL SIGNS: ED Triage Vitals  Enc Vitals Group     BP 10/14/20 2231 115/73     Pulse Rate 10/14/20 2231 (!) 151     Resp 10/14/20 2231 18     Temp 10/14/20 2231 97.6 F (36.4 C)      Temp Source 10/14/20 2231 Oral     SpO2 10/14/20 2231 97 %     Weight 10/14/20 2232 126 lb 15.8 oz (57.6 kg)     Height 10/14/20 2232 4\' 11"  (1.499 m)     Head Circumference --      Peak Flow --      Pain Score 10/14/20 2232 0     Pain Loc --      Pain Edu? --      Excl. in [place]? --     Constitutional: Alert and oriented.  Elderly appearing and in mild acute distress. Eyes: Conjunctivae are normal. PERRL. EOMI. Head: Atraumatic. Nose: No congestion/rhinnorhea. Mouth/Throat: Mucous membranes are mildly dry. Neck: No stridor.   Cardiovascular: Tachycardic rate, irregular rhythm. Grossly normal heart sounds.  Good peripheral circulation. Respiratory: Increased respiratory effort.  No retractions. Lungs CTAB. Gastrointestinal: Soft and nontender to light or deep palpation. No distention. No abdominal bruits. No CVA tenderness. Musculoskeletal: No lower extremity tenderness nor edema.  No joint effusions. Neurologic:  Normal speech and language. No gross focal neurologic deficits are appreciated.  Skin:  Skin is warm, dry and intact. No rash noted. Psychiatric: Mood and affect are normal. Speech and behavior are normal.  ____________________________________________   LABS (all labs ordered are listed, but only abnormal results are displayed)  Labs Reviewed  BASIC METABOLIC PANEL - Abnormal; Notable for the following components:      Result Value   Sodium 127 (*)    Potassium 2.5 (*)    Chloride 93 (*)    CO2 20 (*)    Glucose, Bld 112 (*)    Creatinine, Ser 1.47 (*)    Calcium 8.0 (*)    GFR, Estimated 35 (*)    All other components within normal limits  CBC - Abnormal; Notable for the following components:   WBC 12.3 (*)    RBC 5.26 (*)    RDW 15.6 (*)    All other components within normal limits  BRAIN NATRIURETIC PEPTIDE - Abnormal; Notable for the following components:   B Natriuretic Peptide 445.0 (*)    All other components within normal limits  TSH - Abnormal;  Notable for the following components:   TSH 6.338 (*)    All other components within normal limits  T4, FREE - Abnormal; Notable for the following components:   Free T4 2.09 (*)    All other components within normal limits  CBG MONITORING, ED - Abnormal; Notable for the following  components:   Glucose-Capillary 102 (*)    All other components within normal limits  TROPONIN I (HIGH SENSITIVITY) - Abnormal; Notable for the following components:   Troponin I (High Sensitivity) 30 (*)    All other components within normal limits  RESP PANEL BY RT-PCR (FLU A&B, COVID) ARPGX2  URINALYSIS, COMPLETE (UACMP) WITH MICROSCOPIC  BASIC METABOLIC PANEL  CBC   ____________________________________________  EKG  ED ECG REPORT I, Tajai Suder J, the attending physician, personally viewed and interpreted this ECG.   Date: 10/14/2020  EKG Time: 2234  Rate: 151  Rhythm: atrial fibrillation, rate 151  Axis: Normal  Intervals:none  ST&T Change: Nonspecific  ____________________________________________  RADIOLOGY I, Jru Pense J, personally viewed and evaluated these images (plain radiographs) as part of my medical decision making, as well as reviewing the written report by the radiologist.  ED MD interpretation: No acute cardiopulmonary process; no ICH  Official radiology report(s): CT Head Wo Contrast  Result Date: 10/15/2020 CLINICAL DATA:  Mental status change. Unknown cause. Syncopal episode. Status post ground level fall. EXAM: CT HEAD WITHOUT CONTRAST TECHNIQUE: Contiguous axial images were obtained from the base of the skull through the vertex without intravenous contrast. COMPARISON:  CT head 01/11/2018 FINDINGS: Brain: Cerebral ventricle sizes are concordant with the degree of cerebral volume loss. Patchy and confluent areas of decreased attenuation are noted throughout the deep and periventricular white matter of the cerebral hemispheres bilaterally, compatible with chronic microvascular  ischemic disease. No evidence of large-territorial acute infarction. No parenchymal hemorrhage. No mass lesion. No extra-axial collection. No mass effect or midline shift. No hydrocephalus. Basilar cisterns are patent. Vascular: No hyperdense vessel. Atherosclerotic calcifications are present within the cavernous internal carotid arteries. Skull: No acute fracture or focal lesion. Sinuses/Orbits: Paranasal sinuses and mastoid air cells are clear. Bilateral lens replacement. Otherwise orbits are unremarkable. Other: None. IMPRESSION: No acute intracranial abnormality. Electronically Signed   By: Iven Finn M.D.   On: 10/15/2020 00:12   DG Chest Port 1 View  Result Date: 10/14/2020 CLINICAL DATA:  Recent syncopal episode EXAM: PORTABLE CHEST 1 VIEW COMPARISON:  08/29/2020 FINDINGS: Cardiac shadow is at the upper limits of normal in size. Aortic calcifications are noted. Previously seen right-sided pleural effusion has resolved in the interval from the prior exam. Previously seen vascular congestion has improved significantly when compare with the prior exam. There are some areas of increased density in the right suprahilar region consistent with prior radiation therapy and scarring. This is stable from multiple previous CTs dating back to at least 2020. No focal infiltrate or effusion is noted. IMPRESSION: Resolution of previously seen right-sided pleural effusion. Post radiation changes in the right suprahilar region. No acute abnormality noted. Electronically Signed   By: Inez Catalina M.D.   On: 10/14/2020 23:31    ____________________________________________   PROCEDURES  Procedure(s) performed (including Critical Care):  .1-3 Lead EKG Interpretation Performed by: Paulette Blanch, MD Authorized by: Paulette Blanch, MD     Interpretation: abnormal     ECG rate:  151   ECG rate assessment: tachycardic     Rhythm: atrial fibrillation     Ectopy: none     Conduction: normal   Comments:      Patient placed on cardiac monitor to evaluate for arrhythmias    CRITICAL CARE Performed by: Paulette Blanch   Total critical care time: 45 minutes  Critical care time was exclusive of separately billable procedures and treating other patients.  Critical care was necessary to  treat or prevent imminent or life-threatening deterioration.  Critical care was time spent personally by me on the following activities: development of treatment plan with patient and/or surrogate as well as nursing, discussions with consultants, evaluation of patient's response to treatment, examination of patient, obtaining history from patient or surrogate, ordering and performing treatments and interventions, ordering and review of laboratory studies, ordering and review of radiographic studies, pulse oximetry and re-evaluation of patient's condition.  ____________________________________________   INITIAL IMPRESSION / ASSESSMENT AND PLAN / ED COURSE  As part of my medical decision making, I reviewed the following data within the [place] History obtained from family, Nursing notes reviewed and incorporated, Labs reviewed, EKG interpreted, Old chart reviewed, Radiograph reviewed, Discussed with admitting physician and Notes from prior ED visits     80 year old female presenting for syncope, atrial fibrillation in RVR. Differential diagnosis includes, but is not limited to, ACS, aortic dissection, pulmonary embolism, cardiac tamponade, pneumothorax, pneumonia, pericarditis, myocarditis, GI-related causes including esophagitis/gastritis, and musculoskeletal chest wall pain.    Will obtain cardiac work-up, chest x-ray, CT head.  Blood pressure is marginal; will administer calcium gluconate prior to IV diltiazem bolus for rate control.  Anticipate hospitalization.  Clinical Course as of 10/15/20 6759  Tue Oct 14, 2020  2333 Heart rate remains in atrial fibrillation 126; will administer second bolus  IV Cardizem. [JS]  Wed Oct 15, 2020  0145 Rate improved transiently, now back to 130s.  Will initiate diltiazem drip. [JS]    Clinical Course User Index [JS] Paulette Blanch, MD     ____________________________________________   FINAL CLINICAL IMPRESSION(S) / ED DIAGNOSES  Final diagnoses:  Hypokalemia  Syncope, unspecified syncope type  Atrial fibrillation with rapid ventricular response [facility])     ED Discharge Orders    None      *Please note:  NAJMAH CARRADINE was evaluated in Emergency Department on 10/15/2020 for the symptoms described in the history of present illness. She was evaluated in the context of the global COVID-19 pandemic, which necessitated consideration that the patient might be at risk for infection with the SARS-CoV-2 virus that causes COVID-19. Institutional protocols and algorithms that pertain to the evaluation of patients at risk for COVID-19 are in a state of rapid change based on information released by regulatory bodies including the CDC and federal and state organizations. These policies and algorithms were followed during the patient's care in the ED.  Some ED evaluations and interventions may be delayed as a result of limited staffing during and the pandemic.*   Note:  This document was prepared using Dragon voice recognition software and may include unintentional dictation errors.   Paulette Blanch, MD 10/15/20 [id number]

## 2020-10-15 ENCOUNTER — Other Ambulatory Visit: Payer: Self-pay

## 2020-10-15 ENCOUNTER — Encounter: Payer: Self-pay | Admitting: Family Medicine

## 2020-10-15 DIAGNOSIS — I130 Hypertensive heart and chronic kidney disease with heart failure and stage 1 through stage 4 chronic kidney disease, or unspecified chronic kidney disease: Secondary | ICD-10-CM | POA: Diagnosis present

## 2020-10-15 DIAGNOSIS — I4891 Unspecified atrial fibrillation: Secondary | ICD-10-CM

## 2020-10-15 DIAGNOSIS — E785 Hyperlipidemia, unspecified: Secondary | ICD-10-CM | POA: Diagnosis present

## 2020-10-15 DIAGNOSIS — E871 Hypo-osmolality and hyponatremia: Secondary | ICD-10-CM

## 2020-10-15 DIAGNOSIS — E878 Other disorders of electrolyte and fluid balance, not elsewhere classified: Secondary | ICD-10-CM | POA: Diagnosis present

## 2020-10-15 DIAGNOSIS — I5042 Chronic combined systolic (congestive) and diastolic (congestive) heart failure: Secondary | ICD-10-CM | POA: Diagnosis present

## 2020-10-15 DIAGNOSIS — E876 Hypokalemia: Secondary | ICD-10-CM | POA: Diagnosis present

## 2020-10-15 DIAGNOSIS — R54 Age-related physical debility: Secondary | ICD-10-CM | POA: Diagnosis present

## 2020-10-15 DIAGNOSIS — N179 Acute kidney failure, unspecified: Secondary | ICD-10-CM | POA: Diagnosis present

## 2020-10-15 DIAGNOSIS — E1122 Type 2 diabetes mellitus with diabetic chronic kidney disease: Secondary | ICD-10-CM | POA: Diagnosis present

## 2020-10-15 DIAGNOSIS — K219 Gastro-esophageal reflux disease without esophagitis: Secondary | ICD-10-CM | POA: Diagnosis present

## 2020-10-15 DIAGNOSIS — R531 Weakness: Secondary | ICD-10-CM

## 2020-10-15 DIAGNOSIS — R1110 Vomiting, unspecified: Secondary | ICD-10-CM | POA: Diagnosis present

## 2020-10-15 DIAGNOSIS — J690 Pneumonitis due to inhalation of food and vomit: Secondary | ICD-10-CM | POA: Diagnosis present

## 2020-10-15 DIAGNOSIS — F0390 Unspecified dementia without behavioral disturbance: Secondary | ICD-10-CM

## 2020-10-15 DIAGNOSIS — F32A Depression, unspecified: Secondary | ICD-10-CM | POA: Diagnosis present

## 2020-10-15 DIAGNOSIS — R627 Adult failure to thrive: Secondary | ICD-10-CM | POA: Diagnosis present

## 2020-10-15 DIAGNOSIS — J181 Lobar pneumonia, unspecified organism: Secondary | ICD-10-CM | POA: Diagnosis not present

## 2020-10-15 DIAGNOSIS — Z20822 Contact with and (suspected) exposure to covid-19: Secondary | ICD-10-CM | POA: Diagnosis present

## 2020-10-15 DIAGNOSIS — F0280 Dementia in other diseases classified elsewhere without behavioral disturbance: Secondary | ICD-10-CM | POA: Diagnosis present

## 2020-10-15 DIAGNOSIS — N189 Chronic kidney disease, unspecified: Secondary | ICD-10-CM

## 2020-10-15 DIAGNOSIS — F419 Anxiety disorder, unspecified: Secondary | ICD-10-CM | POA: Diagnosis present

## 2020-10-15 DIAGNOSIS — N182 Chronic kidney disease, stage 2 (mild): Secondary | ICD-10-CM | POA: Diagnosis present

## 2020-10-15 DIAGNOSIS — G309 Alzheimer's disease, unspecified: Secondary | ICD-10-CM | POA: Diagnosis present

## 2020-10-15 DIAGNOSIS — I482 Chronic atrial fibrillation, unspecified: Secondary | ICD-10-CM | POA: Diagnosis present

## 2020-10-15 DIAGNOSIS — R55 Syncope and collapse: Secondary | ICD-10-CM

## 2020-10-15 DIAGNOSIS — I480 Paroxysmal atrial fibrillation: Secondary | ICD-10-CM | POA: Diagnosis present

## 2020-10-15 LAB — URINALYSIS, COMPLETE (UACMP) WITH MICROSCOPIC
Bilirubin Urine: NEGATIVE
Glucose, UA: NEGATIVE mg/dL
Ketones, ur: NEGATIVE mg/dL
Nitrite: NEGATIVE
Protein, ur: NEGATIVE mg/dL
Specific Gravity, Urine: 1.006 (ref 1.005–1.030)
pH: 6 (ref 5.0–8.0)

## 2020-10-15 LAB — CBC
HCT: 37.1 % (ref 36.0–46.0)
Hemoglobin: 13.2 g/dL (ref 12.0–15.0)
MCH: 28.4 pg (ref 26.0–34.0)
MCHC: 35.6 g/dL (ref 30.0–36.0)
MCV: 79.8 fL — ABNORMAL LOW (ref 80.0–100.0)
Platelets: 303 10*3/uL (ref 150–400)
RBC: 4.65 MIL/uL (ref 3.87–5.11)
RDW: 15.6 % — ABNORMAL HIGH (ref 11.5–15.5)
WBC: 9.8 10*3/uL (ref 4.0–10.5)
nRBC: 0 % (ref 0.0–0.2)

## 2020-10-15 LAB — BASIC METABOLIC PANEL
Anion gap: 11 (ref 5–15)
BUN: 15 mg/dL (ref 8–23)
CO2: 21 mmol/L — ABNORMAL LOW (ref 22–32)
Calcium: 8 mg/dL — ABNORMAL LOW (ref 8.9–10.3)
Chloride: 96 mmol/L — ABNORMAL LOW (ref 98–111)
Creatinine, Ser: 1.13 mg/dL — ABNORMAL HIGH (ref 0.44–1.00)
GFR, Estimated: 49 mL/min — ABNORMAL LOW (ref >60)
Glucose, Bld: 105 mg/dL — ABNORMAL HIGH (ref 70–99)
Potassium: 3.1 mmol/L — ABNORMAL LOW (ref 3.5–5.1)
Sodium: 128 mmol/L — ABNORMAL LOW (ref 135–145)

## 2020-10-15 LAB — DIGOXIN LEVEL: Digoxin Level: 0.2 ng/mL — ABNORMAL LOW (ref 0.8–2.0)

## 2020-10-15 LAB — RESP PANEL BY RT-PCR (FLU A&B, COVID) ARPGX2
Influenza A by PCR: NEGATIVE
Influenza B by PCR: NEGATIVE
SARS Coronavirus 2 by RT PCR: NEGATIVE

## 2020-10-15 LAB — BRAIN NATRIURETIC PEPTIDE: B Natriuretic Peptide: 445 pg/mL — ABNORMAL HIGH (ref 0.0–100.0)

## 2020-10-15 LAB — MAGNESIUM: Magnesium: 1.1 mg/dL — ABNORMAL LOW (ref 1.7–2.4)

## 2020-10-15 MED ORDER — ACETAMINOPHEN 650 MG RE SUPP: 650.0000 mg | Freq: Four times a day (QID) | RECTAL | Status: DC | PRN

## 2020-10-15 MED ORDER — AMIODARONE HCL 200 MG PO TABS
200.0000 mg | ORAL_TABLET | Freq: Two times a day (BID) | ORAL | Status: DC
  Administered 2020-10-15 – 2020-10-17 (×5): 200 mg via ORAL
  Filled 2020-10-15 (×5): qty 1

## 2020-10-15 MED ORDER — TRAZODONE HCL 50 MG PO TABS
50.0000 mg | ORAL_TABLET | Freq: Every day | ORAL | Status: DC
  Administered 2020-10-15 – 2020-10-17 (×3): 50 mg via ORAL
  Filled 2020-10-15 (×3): qty 1

## 2020-10-15 MED ORDER — FLUOXETINE HCL 20 MG PO CAPS
40.0000 mg | ORAL_CAPSULE | Freq: Every day | ORAL | Status: DC
  Administered 2020-10-15 – 2020-10-18 (×4): 40 mg via ORAL
  Filled 2020-10-15 (×4): qty 2

## 2020-10-15 MED ORDER — DILTIAZEM HCL-DEXTROSE 125-5 MG/125ML-% IV SOLN (PREMIX)
5.0000 mg/h | INTRAVENOUS | Status: DC
  Administered 2020-10-15: 10 mg/h via INTRAVENOUS
  Administered 2020-10-15 – 2020-10-16 (×2): 5 mg/h via INTRAVENOUS
  Filled 2020-10-15 (×3): qty 125

## 2020-10-15 MED ORDER — ONDANSETRON HCL 4 MG PO TABS: 4.0000 mg | ORAL_TABLET | Freq: Four times a day (QID) | ORAL | Status: DC | PRN

## 2020-10-15 MED ORDER — APIXABAN 2.5 MG PO TABS
2.5000 mg | ORAL_TABLET | Freq: Two times a day (BID) | ORAL | Status: DC
  Administered 2020-10-15 – 2020-10-18 (×7): 2.5 mg via ORAL
  Filled 2020-10-15 (×7): qty 1

## 2020-10-15 MED ORDER — DILTIAZEM HCL 25 MG/5ML IV SOLN
10.0000 mg | Freq: Once | INTRAVENOUS | Status: AC
  Administered 2020-10-15: 10 mg via INTRAVENOUS
  Filled 2020-10-15: qty 5

## 2020-10-15 MED ORDER — SODIUM CHLORIDE 0.9 % IV SOLN: INTRAVENOUS | Status: DC

## 2020-10-15 MED ORDER — MEGESTROL ACETATE 625 MG/5ML PO SUSP: 625.0000 mg | Freq: Every day | ORAL | Status: DC

## 2020-10-15 MED ORDER — DIGOXIN 125 MCG PO TABS: 0.1250 mg | ORAL_TABLET | Freq: Every day | ORAL | Status: DC

## 2020-10-15 MED ORDER — METOPROLOL SUCCINATE ER 50 MG PO TB24
50.0000 mg | ORAL_TABLET | Freq: Every day | ORAL | Status: DC
  Administered 2020-10-15: 50 mg via ORAL
  Filled 2020-10-15 (×2): qty 1

## 2020-10-15 MED ORDER — MAGNESIUM HYDROXIDE 400 MG/5ML PO SUSP: 30.0000 mL | Freq: Every day | ORAL | Status: DC | PRN

## 2020-10-15 MED ORDER — CALCIUM CARBONATE-VITAMIN D 500-200 MG-UNIT PO TABS
1.0000 | ORAL_TABLET | Freq: Every day | ORAL | Status: DC
  Administered 2020-10-16 – 2020-10-18 (×3): 1 via ORAL
  Filled 2020-10-15 (×3): qty 1

## 2020-10-15 MED ORDER — FUROSEMIDE 20 MG PO TABS
20.0000 mg | ORAL_TABLET | Freq: Every day | ORAL | Status: DC
  Administered 2020-10-15: 20 mg via ORAL
  Filled 2020-10-15: qty 1

## 2020-10-15 MED ORDER — ALPRAZOLAM 0.5 MG PO TABS
0.5000 mg | ORAL_TABLET | Freq: Every day | ORAL | Status: DC
  Administered 2020-10-15 – 2020-10-17 (×3): 0.5 mg via ORAL
  Filled 2020-10-15 (×3): qty 1

## 2020-10-15 MED ORDER — POTASSIUM CHLORIDE CRYS ER 20 MEQ PO TBCR
40.0000 meq | EXTENDED_RELEASE_TABLET | Freq: Once | ORAL | Status: AC
  Administered 2020-10-15: 40 meq via ORAL
  Filled 2020-10-15: qty 2

## 2020-10-15 MED ORDER — PANTOPRAZOLE SODIUM 40 MG PO TBEC
40.0000 mg | DELAYED_RELEASE_TABLET | Freq: Every day | ORAL | Status: DC
  Administered 2020-10-15: 40 mg via ORAL
  Filled 2020-10-15 (×2): qty 1

## 2020-10-15 MED ORDER — APIXABAN 2.5 MG PO TABS: 2.5000 mg | ORAL_TABLET | Freq: Two times a day (BID) | ORAL | Status: DC

## 2020-10-15 MED ORDER — TRAZODONE HCL 50 MG PO TABS: 25.0000 mg | ORAL_TABLET | Freq: Every evening | ORAL | Status: DC | PRN

## 2020-10-15 MED ORDER — ONDANSETRON HCL 4 MG/2ML IJ SOLN: 4.0000 mg | Freq: Four times a day (QID) | INTRAMUSCULAR | Status: DC | PRN

## 2020-10-15 MED ORDER — ALBUTEROL SULFATE (2.5 MG/3ML) 0.083% IN NEBU: 2.5000 mg | INHALATION_SOLUTION | Freq: Four times a day (QID) | RESPIRATORY_TRACT | Status: DC | PRN

## 2020-10-15 MED ORDER — MEGESTROL ACETATE 400 MG/10ML PO SUSP
625.0000 mg | Freq: Every day | ORAL | Status: DC
  Administered 2020-10-16 – 2020-10-18 (×3): 625 mg via ORAL
  Filled 2020-10-15 (×3): qty 20

## 2020-10-15 MED ORDER — ACETAMINOPHEN 325 MG PO TABS: 650.0000 mg | ORAL_TABLET | Freq: Four times a day (QID) | ORAL | Status: DC | PRN

## 2020-10-15 MED ORDER — MEMANTINE HCL 5 MG PO TABS
5.0000 mg | ORAL_TABLET | Freq: Two times a day (BID) | ORAL | Status: DC
  Administered 2020-10-15 – 2020-10-18 (×7): 5 mg via ORAL
  Filled 2020-10-15 (×8): qty 1

## 2020-10-15 MED ORDER — ALBUTEROL SULFATE HFA 108 (90 BASE) MCG/ACT IN AERS: 2.0000 | INHALATION_SPRAY | Freq: Four times a day (QID) | RESPIRATORY_TRACT | Status: DC | PRN

## 2020-10-15 MED ORDER — PRAVASTATIN SODIUM 20 MG PO TABS: 20.0000 mg | ORAL_TABLET | Freq: Every day | ORAL | Status: DC

## 2020-10-15 MED ORDER — AMIODARONE HCL 200 MG PO TABS: 200.0000 mg | ORAL_TABLET | Freq: Two times a day (BID) | ORAL | Status: DC

## 2020-10-15 MED ORDER — METOPROLOL SUCCINATE ER 50 MG PO TB24
50.0000 mg | ORAL_TABLET | Freq: Two times a day (BID) | ORAL | Status: DC
  Administered 2020-10-15 – 2020-10-18 (×5): 50 mg via ORAL
  Filled 2020-10-15 (×6): qty 1

## 2020-10-15 MED ORDER — TRAMADOL HCL 50 MG PO TABS
50.0000 mg | ORAL_TABLET | Freq: Four times a day (QID) | ORAL | Status: DC | PRN
  Administered 2020-10-15: 50 mg via ORAL
  Filled 2020-10-15: qty 1

## 2020-10-15 MED ORDER — ALENDRONATE SODIUM 70 MG PO TABS: 70.0000 mg | ORAL_TABLET | ORAL | Status: DC

## 2020-10-15 MED ORDER — ADULT MULTIVITAMIN W/MINERALS CH
1.0000 | ORAL_TABLET | Freq: Every day | ORAL | Status: DC
  Administered 2020-10-15 – 2020-10-18 (×4): 1 via ORAL
  Filled 2020-10-15 (×5): qty 1

## 2020-10-15 MED ORDER — ENSURE ENLIVE PO LIQD
237.0000 mL | Freq: Two times a day (BID) | ORAL | Status: DC
  Administered 2020-10-15 – 2020-10-17 (×5): 237 mL via ORAL

## 2020-10-15 MED ORDER — MAGNESIUM SULFATE 4 GM/100ML IV SOLN
4.0000 g | Freq: Once | INTRAVENOUS | Status: AC
  Administered 2020-10-15: 4 g via INTRAVENOUS
  Filled 2020-10-15 (×2): qty 100

## 2020-10-15 NOTE — Progress Notes (Signed)

## 2020-10-15 NOTE — Evaluation (Signed)
Physical Therapy Evaluation Patient Details Name: Jody [name] MRN: [id number] DOB: 14-Sep-1940 Today's Date: 10/15/2020   History of Present Illness  Pt admitted for Afib with RVR with complaints of LOC in bathroom. History includes asthma, Afib, CHF, and GERD. Pt with recent admission for similar symptoms.  Clinical Impression  Pt is a pleasant 80 year old female who was admitted for Afib with RVR. HR being controlled through IV cardizem drip. Pt performs bed mobility with min assist, transfers with mod assist and ambulation with min assist and RW. Does appear confused but able to participate in care. Pt demonstrates deficits with strength/mobility. Doesn't appear to be at baseline level. Would benefit from skilled PT to address above deficits and promote optimal return to PLOF; recommend transition to STR upon discharge from acute hospitalization.     Follow Up Recommendations SNF    Equipment Recommendations  None recommended by PT    Recommendations for Other Services       Precautions / Restrictions Precautions Precautions: Fall Restrictions Weight Bearing Restrictions: No      Mobility  Bed Mobility Overal bed mobility: Needs Assistance Bed Mobility: Supine to Sit     Supine to sit: Min assist     General bed mobility comments: needs assist for sliding B LEs off bed and cues for initiation of movement. Once seated, able to sit with upright posture    Transfers Overall transfer level: Needs assistance Equipment used: Rolling walker (2 wheeled) Transfers: Sit to/from Stand Sit to Stand: Mod assist         General transfer comment: needs cues for sequencing and initiation. Once standing, forward flexed posture noted. HR monitored throughout.  Ambulation/Gait Ambulation/Gait assistance: Min assist Gait Distance (Feet): 5 Feet Assistive device: Rolling walker (2 wheeled) Gait Pattern/deviations: Step-to pattern     General Gait Details: ambulated from  bed->chair with cues for sequencing. RW used. HR at 82bpm and increased to 94bpm with exertion. Further ambulation deferred to fatigue  Stairs            Wheelchair Mobility    Modified Rankin (Stroke Patients Only)       Balance Overall balance assessment: Needs assistance Sitting-balance support: Feet supported Sitting balance-Leahy Scale: Good     Standing balance support: Bilateral upper extremity supported Standing balance-Leahy Scale: Fair                               Pertinent Vitals/Pain Pain Assessment: No/denies pain    Home Living Family/patient expects to be discharged to:: Private residence Living Arrangements: Spouse/significant other Available Help at Discharge: Family;Available 24 hours/day;Personal care attendant (has aide there M-F for 8hr/day) Type of Home: House Home Access: Stairs to enter Entrance Stairs-Rails: Can reach both Entrance Stairs-Number of Steps: 3-4 steps Home Layout: One level Home Equipment: Walker - 2 wheels;Wheelchair - manual;Bedside commode      Prior Function Level of Independence: Needs assistance   Gait / Transfers Assistance Needed: Pt was previously able to ambulate house hold distances with RW, however most recently has been limited to transfers with RW and most mobility through [place].     Comments: reports recent falls and general decline in mobility     Hand Dominance        Extremity/Trunk Assessment   Upper Extremity Assessment Upper Extremity Assessment: Generalized weakness (B UE grossly 3+/5)    Lower Extremity Assessment Lower Extremity Assessment: Generalized weakness (B LE grossly  4/5)       Communication   Communication: No difficulties  Cognition Arousal/Alertness: Awake/alert Behavior During Therapy: WFL for tasks assessed/performed Overall Cognitive Status: History of cognitive impairments - at baseline                                        General  Comments      Exercises     Assessment/Plan    PT Assessment Patient needs continued PT services  PT Problem List Decreased strength;Decreased activity tolerance;Decreased balance;Decreased mobility;Decreased cognition       PT Treatment Interventions Gait training;Therapeutic exercise;Balance training    PT Goals (Current goals can be found in the Care Plan section)  Acute Rehab PT Goals Patient Stated Goal: to get stronger PT Goal Formulation: With patient Time For Goal Achievement: 10/29/20 Potential to Achieve Goals: Good    Frequency Min 2X/week   Barriers to discharge        Co-evaluation               AM-PAC PT "6 Clicks" Mobility  Outcome Measure Help needed turning from your back to your side while in a flat bed without using bedrails?: A Little Help needed moving from lying on your back to sitting on the side of a flat bed without using bedrails?: A Little Help needed moving to and from a bed to a chair (including a wheelchair)?: A Little Help needed standing up from a chair using your arms (e.g., wheelchair or bedside chair)?: A Little Help needed to walk in hospital room?: A Little Help needed climbing 3-5 steps with a railing? : A Lot 6 Click Score: 17    End of Session Equipment Utilized During Treatment: Gait belt Activity Tolerance: Patient tolerated treatment well Patient left: in chair;with chair alarm set;with family/visitor present Nurse Communication: Mobility status PT Visit Diagnosis: Muscle weakness (generalized) (M62.81);Difficulty in walking, not elsewhere classified (R26.2);Unsteadiness on feet (R26.81)    Time: 0263-7858 PT Time Calculation (min) (ACUTE ONLY): 22 min   Charges:   PT Evaluation $PT Eval Low Complexity: [place], PT, DPT [phone]   Reginold Beale 10/15/2020, 4:43 PM

## 2020-10-15 NOTE — H&P (Signed)
[place]   PATIENT NAME: Jody [name]    MR#:  [id number]  DATE OF BIRTH:  06-Oct-1940  DATE OF ADMISSION:  10/14/2020  PRIMARY CARE PHYSICIAN: Baxter Hire, MD   Patient is coming from: Home  REQUESTING/REFERRING PHYSICIAN: Lurline Hare, MD  CHIEF COMPLAINT:   Chief Complaint  Patient presents with  . Loss of Consciousness  . Abnormal ECG    HISTORY OF PRESENT ILLNESS:  Jody [name] is a 80 y.o. Caucasian female with medical history significant for asthma, atrial fibrillation, CHF, GERD and hypertension, who presented to the emergency room with acute onset of syncope.  The patient and her husband help her from the toilet after urination back to her wheelchair when she collapsed and remained unconscious for 2 to 3 minutes.  There were no falls or injuries.  No head injuries.  When the patient came to the ER she was found in atrial fibrillation with RVR.  She stated that she felt funny and lightheaded and was diaphoretic and had shortness of breath during this episode.  No recent fever or chills.  No nausea or vomiting or abdominal pain.  She denied any chest pain or even palpitations.  No dysuria, oliguria or hematuria or flank pain.  Most recent 2D echo in June 2020 revealed EF of 50 to 16%, diastolic dysfunction, moderately dilated left atrial dilatation and moderate mitral valve regurgitation and moderate tricuspid regurgitation.  ED Course: Upon presentation to the ER heart rate was 151 with otherwise normal vital signs.  Labs revealed hyponatremia 127 with hypochloremia of 93, potassium of 2.5 a creatinine of 1.47 up from 0.8 on 08/30/2020.  BNP was 445 and has history of troponin I was 30.  CBC showed leukocytosis of 12.3 with TSH of 6.3 and free T4 of 2.09.  Influenza antigens and COVID-19 PCR came back negative. EKG as reviewed by me : Showed atrial fibrillation with RVR of 151.   Imaging: Chest x-ray showed resolution of previously seen right-sided pleural effusion  and postradiation changes in the right suprahilar area.  The patient was given 10 mg of IV Cardizem, and hypocalcemic and 8 and 60 mg 1 p.o. potassium chloride.  The patient will be admitted to a progressive unit with her further evaluation and management. PAST MEDICAL HISTORY:   Past Medical History:  Diagnosis Date  . Asthma   . Atrial fibrillation ([place])   . CHF (congestive heart failure) ([place])   . 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 Dysrhythmia   . GERD (gastroesophageal reflux disease)   . Hypertension   . Memory difficulties   . Primary adenocarcinoma of right lung ([place]) 02/2005   Rad tx's.   . Uses walker     PAST SURGICAL HISTORY:   Past Surgical History:  Procedure Laterality Date  . CARDIOVERSION N/A 09/24/2020   Procedure: CARDIOVERSION;  Surgeon: Corey Skains, MD;  Location: [facility];  Service: Cardiovascular;  Laterality: N/A;  . CATARACT EXTRACTION W/PHACO Left 04/29/2020   Procedure: CATARACT EXTRACTION PHACO AND INTRAOCULAR LENS PLACEMENT (IOC) LEFT 17.47 01:28.8;  Surgeon: Birder Robson, MD;  Location: [place];  Service: Ophthalmology;  Laterality: Left;  . CATARACT EXTRACTION W/PHACO Right 05/20/2020   Procedure: CATARACT EXTRACTION PHACO AND INTRAOCULAR LENS PLACEMENT (IOC) RIGHT 14.89 01:13.8;  Surgeon: Birder Robson, MD;  Location: [place];  Service: Ophthalmology;  Laterality: Right;  . COLOSTOMY N/A 01/05/2018   Procedure: COLOSTOMY;  Surgeon:  Lysle Pearl, Isami, DO;  Location: [facility];  Service: General;  Laterality: N/A;  . COLOSTOMY TAKEDOWN N/A 11/28/2018   Procedure: COLOSTOMY TAKEDOWN, DIABETIC;  Surgeon: Benjamine Sprague, DO;  Location: [facility];  Service: General;  Laterality: N/A;  . LAPAROTOMY N/A 01/05/2018   Procedure: EXPLORATORY LAPAROTOMY/ POSSIBLE BOWEL OBSTRUCTION;  Surgeon: Benjamine Sprague, DO;  Location: [facility];  Service: General;  Laterality: N/A;  . LYSIS OF ADHESION N/A 01/05/2018    Procedure: LYSIS OF ADHESION;  Surgeon: Benjamine Sprague, DO;  Location: [facility];  Service: General;  Laterality: N/A;    SOCIAL HISTORY:   Social History   Tobacco Use  . Smoking status: Never Smoker  . Smokeless tobacco: Never Used  Substance Use Topics  . Alcohol use: No    FAMILY HISTORY:   Family History  Problem Relation Age of Onset  . Breast cancer Paternal Aunt     DRUG ALLERGIES:   Allergies  Allergen Reactions  . Donepezil     Loss of appetite  . Poison Oak Extract Other (See Comments)    blistering  . Ampicillin Rash  . Penicillin V Potassium Rash    Has patient had a PCN reaction causing immediate rash, facial/tongue/throat swelling, SOB or lightheadedness with hypotension: No Has patient had a PCN reaction causing severe rash involving mucus membranes or skin necrosis: No Has patient had a PCN reaction that required hospitalization: No Has patient had a PCN reaction occurring within the last 10 years: No If all of the above answers are "NO", then may proceed with Cephalosporin use.     REVIEW OF SYSTEMS:   ROS As per history of present illness. All pertinent systems were reviewed above. Constitutional, HEENT, cardiovascular, respiratory, GI, GU, musculoskeletal, neuro, psychiatric, endocrine, integumentary and hematologic systems were reviewed and are otherwise negative/unremarkable except for positive findings mentioned above in the HPI.   MEDICATIONS AT HOME:   Prior to Admission medications   Medication Sig Start Date End Date Taking? Authorizing Provider  acetaminophen (TYLENOL) 325 MG tablet Take 2 tablets (650 mg total) by mouth every 6 (six) hours as needed (fever > 101). Patient taking differently: Take 325 mg by mouth every 6 (six) hours as needed for moderate pain. 12/03/18   Olean Ree, MD  albuterol (PROVENTIL) (2.5 MG/3ML) 0.083% nebulizer solution Take 2.5 mg by nebulization every 6 (six) hours as needed for wheezing or shortness of  breath.    [provider]  albuterol (VENTOLIN HFA) 108 (90 Base) MCG/ACT inhaler Inhale 2 puffs into the lungs every 6 (six) hours as needed for wheezing or shortness of breath.     [provider]  alendronate (FOSAMAX) 70 MG tablet Take 70 mg by mouth every Saturday.    [provider]  ALPRAZolam Duanne Moron) 0.5 MG tablet Take 0.5 mg by mouth at bedtime.    [provider]  amiodarone (PACERONE) 200 MG tablet Take 1 tablet (200 mg total) by mouth 2 (two) times daily. 09/01/20   Fritzi Mandes, MD  apixaban (ELIQUIS) 2.5 MG TABS tablet Take 1 tablet (2.5 mg total) by mouth 2 (two) times daily. 09/01/20   Fritzi Mandes, MD  Calcium Carbonate-Vitamin D 600-200 MG-UNIT TABS Take 1 tablet by mouth daily.    [provider]  digoxin (LANOXIN) 0.125 MG tablet Take 1 tablet (0.125 mg total) by mouth daily. 01/22/18   Gladstone Lighter, MD  FLUoxetine (PROZAC) 40 MG capsule Take 40 mg by mouth daily.    [provider]  furosemide (LASIX) 20 MG tablet Take 20 mg by mouth daily.    [provider]  lovastatin (MEVACOR) 20 MG tablet Take 20 mg by mouth daily with supper.     [provider]  megestrol (MEGACE ES) 625 MG/5ML suspension Take 625 mg by mouth daily. 08/21/20   [provider]  memantine (NAMENDA) 5 MG tablet Take 5 mg by mouth 2 (two) times daily. 08/23/20   [provider]  metoprolol succinate (TOPROL XL) 50 MG 24 hr tablet Take 1 tablet (50 mg total) by mouth in the morning and at bedtime. Hold if BP is <120 and HR<70 09/01/20   Fritzi Mandes, MD  Multiple Vitamin (MULTI-VITAMINS) TABS Take 1 tablet by mouth daily.     [provider]  omeprazole (PRILOSEC) 20 MG capsule Take 20 mg by mouth in the morning and at bedtime.  08/15/14   [provider]  traMADol (ULTRAM) 50 MG tablet Take 50 mg by mouth every 6 (six) hours as needed for moderate pain.    [provider]  traZODone (DESYREL) 50  MG tablet Take 50 mg by mouth at bedtime.    [provider]      VITAL SIGNS:  Blood pressure 119/86, pulse (!) 115, temperature 97.6 F (36.4 C), temperature source Oral, resp. rate (!) 27, height 4\' 11"  (1.499 m), weight 57.6 kg, SpO2 99 %.  PHYSICAL EXAMINATION:  Physical Exam  GENERAL:  80 y.o.-year-old female patient lying in the bed with no acute distress.  EYES: Pupils equal, round, reactive to light and accommodation. No scleral icterus. Extraocular muscles intact.  HEENT: Head atraumatic, normocephalic. Oropharynx and nasopharynx clear.  NECK:  Supple, no jugular venous distention. No thyroid enlargement, no tenderness.  LUNGS: Normal breath sounds bilaterally, no wheezing, rales,rhonchi or crepitation. No use of accessory muscles of respiration.  CARDIOVASCULAR: Irregularly irregular tachycardic rhythm, S1, S2 normal. No murmurs, rubs, or gallops.  ABDOMEN: Soft, nondistended, nontender. Bowel sounds present. No organomegaly or mass.  EXTREMITIES: No pedal edema, cyanosis, or clubbing.  NEUROLOGIC: Cranial nerves II through XII are intact. Muscle strength 5/5 in all extremities. Sensation intact. Gait not checked.  PSYCHIATRIC: The patient is alert and oriented x 3.  Normal affect and good eye contact. SKIN: No obvious rash, lesion, or ulcer.   LABORATORY PANEL:   CBC Recent Labs  Lab 10/14/20 2234  WBC 12.3*  HGB 15.0  HCT 42.3  PLT 333   ------------------------------------------------------------------------------------------------------------------  Chemistries  Recent Labs  Lab 10/14/20 2234  NA 127*  K 2.5*  CL 93*  CO2 20*  GLUCOSE 112*  BUN 16  CREATININE 1.47*  CALCIUM 8.0*   ------------------------------------------------------------------------------------------------------------------  Cardiac Enzymes No results for input(s): TROPONINI in the last 168  hours. ------------------------------------------------------------------------------------------------------------------  RADIOLOGY:  CT Head Wo Contrast  Result Date: 10/15/2020 CLINICAL DATA:  Mental status change. Unknown cause. Syncopal episode. Status post ground level fall. EXAM: CT HEAD WITHOUT CONTRAST TECHNIQUE: Contiguous axial images were obtained from the base of the skull through the vertex without intravenous contrast. COMPARISON:  CT head 01/11/2018 FINDINGS: Brain: Cerebral ventricle sizes are concordant with the degree of cerebral volume loss. Patchy and confluent areas of decreased attenuation are noted throughout the deep and periventricular white matter of the cerebral hemispheres bilaterally, compatible with chronic microvascular ischemic disease. No evidence of large-territorial acute infarction. No parenchymal hemorrhage. No mass lesion. No extra-axial collection. No mass effect or midline shift. No hydrocephalus. Basilar cisterns are patent. Vascular: No hyperdense vessel.  Atherosclerotic calcifications are present within the cavernous internal carotid arteries. Skull: No acute fracture or focal lesion. Sinuses/Orbits: Paranasal sinuses and mastoid air cells are clear. Bilateral lens replacement. Otherwise orbits are unremarkable. Other: None. IMPRESSION: No acute intracranial abnormality. Electronically Signed   By: Iven Finn M.D.   On: 10/15/2020 00:12   DG Chest Port 1 View  Result Date: 10/14/2020 CLINICAL DATA:  Recent syncopal episode EXAM: PORTABLE CHEST 1 VIEW COMPARISON:  08/29/2020 FINDINGS: Cardiac shadow is at the upper limits of normal in size. Aortic calcifications are noted. Previously seen right-sided pleural effusion has resolved in the interval from the prior exam. Previously seen vascular congestion has improved significantly when compare with the prior exam. There are some areas of increased density in the right suprahilar region consistent with prior  radiation therapy and scarring. This is stable from multiple previous CTs dating back to at least 2020. No focal infiltrate or effusion is noted. IMPRESSION: Resolution of previously seen right-sided pleural effusion. Post radiation changes in the right suprahilar region. No acute abnormality noted. Electronically Signed   By: Inez Catalina M.D.   On: 10/14/2020 23:31      IMPRESSION AND PLAN:  Active Problems:   Atrial fibrillation with RVR (HCC)  1.  Atrial fibrillation with rapid ventricle response. - The patient will be placed under observation progressive unit bed. - We will continue IV Cardizem drip if needed. - We will continue digoxin. - We will aggressively place potassium. - We will continue amiodarone and Eliquis.  2.  Hypokalemia. - This likely contributing to #1. - Aggressive potassium replacement will be pursued and mag level will be checked.  3.  Acute kidney injury. - The patient be hydrated with IV normal saline and will follow BMP. -We will avoid nephrotoxins.  4.  GERD. - We will continue PPI therapy.  5.  Dyslipidemia. - We will continue statin therapy.  6.  Depression and anxiety. - We will continue Prozac and Xanax.  DVT prophylaxis: Continue Eliquis. Code Status: full code. Family Communication:  The plan of care was discussed in details with the patient (and family). I answered all questions. The patient agreed to proceed with the above mentioned plan. Further management will depend upon hospital course. Disposition Plan: Back to previous home environment Consults called: Cardiology. All the records are reviewed and case discussed with ED provider.  Status is: Observation  The patient remains OBS appropriate and will d/c before 2 midnights.  Dispo: The patient is from: Home              Anticipated d/c is to: Home              Patient currently is not medically stable to d/c.   Difficult to place patient No  TOTAL TIME TAKING CARE OF THIS  PATIENT: 55 minutes.    Christel Mormon M.D on 10/15/2020 at 1:09 AM  [facility]   From 7 PM-7 AM, contact night-coverage [email or web address]  CC: Primary care physician; Baxter Hire, MD

## 2020-10-15 NOTE — Consult Note (Signed)
CARDIOLOGY CONSULT NOTE               Patient ID: KADANCE MCCUISTION MRN: [id number] DOB/AGE: 1940-09-14 80 y.o.  Admit date: 10/14/2020 Referring Physician Leslye Peer Primary Physician [facility] Cardiologist Nehemiah Massed Reason for Consultation atrial fibrillation with RVR  HPI: 80 year old female referred for evaluation of atrial fibrillation with RVR. The patient has a history of chronic atrial fibrillation on amiodarone, digoxin, metoprolol succinate, and low dose Eliquis, status post successful cardioversion on 9/93/7169, chronic diastolic and systolic CHF, essential hypertension, hyperlipidemia, type II diabetes, asthma, tricuspid regurgitation, Alzheimer's dementia.  The patient presented to [facility] ER late last night for evaluation of syncope. Apparently, the patient was standing at the sink with her husband assisting her to clean up when she had an apparent syncopal episode, fell back into the wheelchair behind her, and was difficult to arouse. EMS was called. For the last 3 to 4 weeks, the patient has not been eating well since an admission in late March for acute on chronic diastolic heart failure.  The patient was noted to be in atrial fibrillation with RVR with rates in the 150s.  The patient was given IV Cardizem, then started on Cardizem drip. Admission labs notable for sodium 127, chloride 93, potassium 2.5, magnesium 1.1, creatinine 1.47, BNP 445, TSH 6.338, free T4 2.0, WBC 12.3, high-sensitivity troponin 30. Head CT was negative.  Chest x-ray negative for acute abnormality. 2D echocardiogram 12/27/2019 revealed LVEF 40 to 45% with moderate mitral and tricuspid regurgitation. The patient has baseline dementia and is unable to provide elaborate history. She answers questions with brief responses. She denies chest pain, reports feeling tired and short of breath. The patient's son at the bedside contributes to the history. Per the son, she has not complained of shortness of breath, chest  pain, or had peripheral edema.    Review of systems incomplete due to patient's baseline dementia.    Past Medical History:  Diagnosis Date  . Asthma   . Atrial fibrillation ([place])   . CHF (congestive heart failure) ([place])   . 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 Dysrhythmia   . GERD (gastroesophageal reflux disease)   . Hypertension   . Memory difficulties   . Primary adenocarcinoma of right lung ([place]) 02/2005   Rad tx's.   . Uses walker     Past Surgical History:  Procedure Laterality Date  . CARDIOVERSION N/A 09/24/2020   Procedure: CARDIOVERSION;  Surgeon: Corey Skains, MD;  Location: [facility] ORS;  Service: Cardiovascular;  Laterality: N/A;  . CATARACT EXTRACTION W/PHACO Left 04/29/2020   Procedure: CATARACT EXTRACTION PHACO AND INTRAOCULAR LENS PLACEMENT (IOC) LEFT 17.47 01:28.8;  Surgeon: Birder Robson, MD;  Location: [place];  Service: Ophthalmology;  Laterality: Left;  . CATARACT EXTRACTION W/PHACO Right 05/20/2020   Procedure: CATARACT EXTRACTION PHACO AND INTRAOCULAR LENS PLACEMENT (IOC) RIGHT 14.89 01:13.8;  Surgeon: Birder Robson, MD;  Location: [place];  Service: Ophthalmology;  Laterality: Right;  . COLOSTOMY N/A 01/05/2018   Procedure: COLOSTOMY;  Surgeon: Benjamine Sprague, DO;  Location: [facility] ORS;  Service: General;  Laterality: N/A;  . COLOSTOMY TAKEDOWN N/A 11/28/2018   Procedure: COLOSTOMY TAKEDOWN, DIABETIC;  Surgeon: Benjamine Sprague, DO;  Location: [facility];  Service: General;  Laterality: N/A;  . LAPAROTOMY N/A 01/05/2018   Procedure: EXPLORATORY LAPAROTOMY/ POSSIBLE BOWEL OBSTRUCTION;  Surgeon: Benjamine Sprague, DO;  Location: [facility];  Service: General;  Laterality: N/A;  . LYSIS OF ADHESION  N/A 01/05/2018   Procedure: LYSIS OF ADHESION;  Surgeon: Benjamine Sprague, DO;  Location: [facility];  Service: General;  Laterality: N/A;    Medications Prior to Admission  Medication Sig Dispense Refill Last Dose  . acetaminophen  (TYLENOL) 325 MG tablet Take 2 tablets (650 mg total) by mouth every 6 (six) hours as needed (fever > 101). (Patient taking differently: Take 325 mg by mouth every 6 (six) hours as needed for moderate pain.)     . albuterol (PROVENTIL) (2.5 MG/3ML) 0.083% nebulizer solution Take 2.5 mg by nebulization every 6 (six) hours as needed for wheezing or shortness of breath.     Marland Kitchen albuterol (VENTOLIN HFA) 108 (90 Base) MCG/ACT inhaler Inhale 2 puffs into the lungs every 6 (six) hours as needed for wheezing or shortness of breath.      Marland Kitchen alendronate (FOSAMAX) 70 MG tablet Take 70 mg by mouth every Saturday.     . ALPRAZolam (XANAX) 0.5 MG tablet Take 0.5 mg by mouth at bedtime.     Marland Kitchen amiodarone (PACERONE) 200 MG tablet Take 1 tablet (200 mg total) by mouth 2 (two) times daily. 60 tablet 1   . apixaban (ELIQUIS) 2.5 MG TABS tablet Take 1 tablet (2.5 mg total) by mouth 2 (two) times daily. 60 tablet 3   . Calcium Carbonate-Vitamin D 600-200 MG-UNIT TABS Take 1 tablet by mouth daily.     . digoxin (LANOXIN) 0.125 MG tablet Take 1 tablet (0.125 mg total) by mouth daily. 30 tablet 0   . FLUoxetine (PROZAC) 40 MG capsule Take 40 mg by mouth daily.     . furosemide (LASIX) 20 MG tablet Take 20 mg by mouth daily.     Marland Kitchen lovastatin (MEVACOR) 20 MG tablet Take 20 mg by mouth daily with supper.      . megestrol (MEGACE ES) 625 MG/5ML suspension Take 625 mg by mouth daily.     . memantine (NAMENDA) 5 MG tablet Take 5 mg by mouth 2 (two) times daily.     . metoprolol succinate (TOPROL XL) 50 MG 24 hr tablet Take 1 tablet (50 mg total) by mouth in the morning and at bedtime. Hold if BP is <120 and HR<70 60 tablet 2   . Multiple Vitamin (MULTI-VITAMINS) TABS Take 1 tablet by mouth daily.      Marland Kitchen omeprazole (PRILOSEC) 20 MG capsule Take 20 mg by mouth in the morning and at bedtime.      . traMADol (ULTRAM) 50 MG tablet Take 50 mg by mouth every 6 (six) hours as needed for moderate pain.     . traZODone (DESYREL) 50 MG tablet  Take 50 mg by mouth at bedtime.      Social History   Socioeconomic History  . Marital status: Married    Spouse name: Not on file  . Number of children: Not on file  . Years of education: Not on file  . Highest education level: Not on file  Occupational History  . Not on file  Tobacco Use  . Smoking status: Never Smoker  . Smokeless tobacco: Never Used  Vaping Use  . Vaping Use: Never used  Substance and Sexual Activity  . Alcohol use: No  . Drug use: Never  . Sexual activity: Not on file  Other Topics Concern  . Not on file  Social History Narrative  . Not on file   Social Determinants of Health   Financial Resource Strain: Not on file  Food Insecurity: Not on  file  Transportation Needs: Not on file  Physical Activity: Not on file  Stress: Not on file  Social Connections: Not on file  Intimate Partner Violence: Not on file    Family History  Problem Relation Age of Onset  . Breast cancer Paternal Aunt       Review of systems complete and found to be negative unless listed above      PHYSICAL EXAM  General: Elderly female sitting up in recliner, fidgeting with wires and IV lines, in no acute distress HEENT:  Normocephalic and atramatic Neck:  No JVD.  Lungs: Clear bilaterally to auscultation of anterior chest, normal effort of breathing on room air Heart: HRRR . Normal S1 and S2 without gallops or murmurs.  Abdomen: soft, nondistended Extremities: No clubbing, cyanosis or edema.   Neuro: Alert, disoriented   Labs:   Lab Results  Component Value Date   WBC 9.8 10/15/2020   HGB 13.2 10/15/2020   HCT 37.1 10/15/2020   MCV 79.8 (L) 10/15/2020   PLT 303 10/15/2020    Recent Labs  Lab 10/15/20 0522  NA 128*  K 3.1*  CL 96*  CO2 21*  BUN 15  CREATININE 1.13*  CALCIUM 8.0*  GLUCOSE 105*   Lab Results  Component Value Date   TROPONINI <0.03 03/30/2018   No results found for: CHOL No results found for: HDL No results found for: [facility] Lab  Results  Component Value Date   TRIG 119 01/16/2018   TRIG 123 01/09/2018   TRIG 66 01/03/2018   No results found for: CHOLHDL No results found for: LDLDIRECT    Radiology: CT Head Wo Contrast  Result Date: 10/15/2020 CLINICAL DATA:  Mental status change. Unknown cause. Syncopal episode. Status post ground level fall. EXAM: CT HEAD WITHOUT CONTRAST TECHNIQUE: Contiguous axial images were obtained from the base of the skull through the vertex without intravenous contrast. COMPARISON:  CT head 01/11/2018 FINDINGS: Brain: Cerebral ventricle sizes are concordant with the degree of cerebral volume loss. Patchy and confluent areas of decreased attenuation are noted throughout the deep and periventricular white matter of the cerebral hemispheres bilaterally, compatible with chronic microvascular ischemic disease. No evidence of large-territorial acute infarction. No parenchymal hemorrhage. No mass lesion. No extra-axial collection. No mass effect or midline shift. No hydrocephalus. Basilar cisterns are patent. Vascular: No hyperdense vessel. Atherosclerotic calcifications are present within the cavernous internal carotid arteries. Skull: No acute fracture or focal lesion. Sinuses/Orbits: Paranasal sinuses and mastoid air cells are clear. Bilateral lens replacement. Otherwise orbits are unremarkable. Other: None. IMPRESSION: No acute intracranial abnormality. Electronically Signed   By: Iven Finn M.D.   On: 10/15/2020 00:12   DG Chest Port 1 View  Result Date: 10/14/2020 CLINICAL DATA:  Recent syncopal episode EXAM: PORTABLE CHEST 1 VIEW COMPARISON:  08/29/2020 FINDINGS: Cardiac shadow is at the upper limits of normal in size. Aortic calcifications are noted. Previously seen right-sided pleural effusion has resolved in the interval from the prior exam. Previously seen vascular congestion has improved significantly when compare with the prior exam. There are some areas of increased density in the right  suprahilar region consistent with prior radiation therapy and scarring. This is stable from multiple previous CTs dating back to at least 2020. No focal infiltrate or effusion is noted. IMPRESSION: Resolution of previously seen right-sided pleural effusion. Post radiation changes in the right suprahilar region. No acute abnormality noted. Electronically Signed   By: Inez Catalina M.D.   On: 10/14/2020 23:31  EKG: atrial fibrillation with RVR  ASSESSMENT AND PLAN:  1. Atrial fibrillation with RVR, status post successful cardioversion 09/24/20, currently on diltiazem drip, PO amiodarone, digoxin, and metoprolol succinate, and Eliquis for stroke prevention. Currently in sinus rhythm. 2. Syncope, in the setting of severe hypokalemia, hypomagnesemia, and hyponatremia, with recent poor PO intake, and atrial fibrillation with RVR. 3. Dementia 4. Severe hypokalemia, 2.5 5. Severe hypomagnesemia, 1.1 6. Hyponatremia, 127 7. Acute kidney injury, creatinine 1.47 (up from 0.81 in 08/2020) 8. Chronic systolic and diastolic CHF, does not appear fluid overloaded. BNP elevated to 445 (down from 1896 2 months ago)  Recommendations: 1. Titrate diltiazem drip to off, and continue amiodarone, metoprolol, and digoxin at current doses 2. Continue Eliquis for stroke prevention 3. Continue to replenish magnesium and potassium 4. Continue gentle hydration in setting of AKI with monitoring for fluid overload 5. Continue to monitor on telemetry  Signed: Clabe Seal PA-C 10/15/2020, 3:51 PM

## 2020-10-15 NOTE — Progress Notes (Signed)
Patient ID: Jody [name], female   DOB: 1940/12/28, 80 y.o.   MRN: [id number] [facility] PROGRESS NOTE  Jody [name]:[id number] DOB: August 23, 1940 DOA: 10/14/2020 PCP: Baxter Hire, MD  HPI/Subjective: The patient is a poor historian.  As per husband, last 3 to 4 weeks she has not been eating well.  She has had a previous hospitalization for heart failure and since then has not regained her appetite.  Patient had a syncopal episode and was found to be in rapid atrial fibrillation in the emergency room.  Patient was started on Cardizem drip.  Objective: Vitals:   10/15/20 1156 10/15/20 1250  BP: (!) 97/50 108/61  Pulse: 88 97  Resp: 18   Temp: 98.5 F (36.9 C)   SpO2: 99%    No intake or output data in the 24 hours ending 10/15/20 1435 Filed Weights   10/14/20 2232 10/15/20 1156  Weight: 57.6 kg 52.8 kg    ROS: Review of Systems  Respiratory: Negative for shortness of breath.   Cardiovascular: Negative for chest pain.  Gastrointestinal: Negative for abdominal pain.   Exam: Physical Exam HENT:     Head: Normocephalic.     Mouth/Throat:     Pharynx: No oropharyngeal exudate.  Eyes:     General: Lids are normal.     Conjunctiva/sclera: Conjunctivae normal.  Cardiovascular:     Rate and Rhythm: Tachycardia present. Rhythm irregularly irregular.     Heart sounds: Normal heart sounds, S1 normal and S2 normal.  Pulmonary:     Breath sounds: Normal breath sounds. No decreased breath sounds, wheezing, rhonchi or rales.  Abdominal:     Palpations: Abdomen is soft.     Tenderness: There is no abdominal tenderness.  Musculoskeletal:     Right ankle: Swelling present.     Left ankle: Swelling present.  Skin:    General: Skin is warm.     Findings: No rash.  Neurological:     Mental Status: She is alert.     Comments: Answers yes or no questions.  Does not elaborate much.  Able to straight leg raise.       Data Reviewed: Basic Metabolic Panel: Recent Labs   Lab 10/14/20 2234 10/15/20 0522  NA 127* 128*  K 2.5* 3.1*  CL 93* 96*  CO2 20* 21*  GLUCOSE 112* 105*  BUN 16 15  CREATININE 1.47* 1.13*  CALCIUM 8.0* 8.0*  MG  --  1.1*   CBC: Recent Labs  Lab 10/14/20 2234 10/15/20 0522  WBC 12.3* 9.8  HGB 15.0 13.2  HCT 42.3 37.1  MCV 80.4 79.8*  PLT 333 303   BNP (last 3 results) Recent Labs    08/29/20 1742 10/14/20 2234  BNP 1,896.8* 445.0*    CBG: Recent Labs  Lab 10/14/20 2244  GLUCAP 102*    Recent Results (from the past 240 hour(s))  Resp Panel by RT-PCR (Flu A&B, Covid) Nasopharyngeal Swab     Status: None   Collection Time: 10/14/20 11:35 PM   Specimen: Nasopharyngeal Swab; Nasopharyngeal(NP) swabs in vial transport medium  Result Value Ref Range Status   SARS Coronavirus 2 by RT PCR NEGATIVE NEGATIVE Final    Comment: (NOTE) SARS-CoV-2 target nucleic acids are NOT DETECTED.  The SARS-CoV-2 RNA is generally detectable in upper respiratory specimens during the acute phase of infection. The lowest concentration of SARS-CoV-2 viral copies this assay can detect is 138 copies/mL. A negative result does not preclude SARS-Cov-2 infection and should not  be used as the sole basis for treatment or other patient management decisions. A negative result may occur with  improper specimen collection/handling, submission of specimen other than nasopharyngeal swab, presence of viral mutation(s) within the areas targeted by this assay, and inadequate number of viral copies(<138 copies/mL). A negative result must be combined with clinical observations, patient history, and epidemiological information. The expected result is Negative.  Fact Sheet for Patients:  EntrepreneurPulse.com.au  Fact Sheet for Healthcare Providers:  IncredibleEmployment.be  This test is no t yet approved or cleared by the [place] FDA and  has been authorized for detection and/or diagnosis of SARS-CoV-2  by FDA under an Emergency Use Authorization (EUA). This EUA will remain  in effect (meaning this test can be used) for the duration of the COVID-19 declaration under Section 564(b)(1) of the Act, 21 U.S.C.section 360bbb-3(b)(1), unless the authorization is terminated  or revoked sooner.       Influenza A by PCR NEGATIVE NEGATIVE Final   Influenza B by PCR NEGATIVE NEGATIVE Final    Comment: (NOTE) The Xpert Xpress SARS-CoV-2/FLU/RSV plus assay is intended as an aid in the diagnosis of influenza from Nasopharyngeal swab specimens and should not be used as a sole basis for treatment. Nasal washings and aspirates are unacceptable for Xpert Xpress SARS-CoV-2/FLU/RSV testing.  Fact Sheet for Patients: EntrepreneurPulse.com.au  Fact Sheet for Healthcare Providers: IncredibleEmployment.be  This test is not yet approved or cleared by the [place] FDA and has been authorized for detection and/or diagnosis of SARS-CoV-2 by FDA under an Emergency Use Authorization (EUA). This EUA will remain in effect (meaning this test can be used) for the duration of the COVID-19 declaration under Section 564(b)(1) of the Act, 21 U.S.C. section 360bbb-3(b)(1), unless the authorization is terminated or revoked.  Performed at [facility], [address] [place], [place] [address]      Studies: CT Head Wo Contrast  Result Date: 10/15/2020 CLINICAL DATA:  Mental status change. Unknown cause. Syncopal episode. Status post ground level fall. EXAM: CT HEAD WITHOUT CONTRAST TECHNIQUE: Contiguous axial images were obtained from the base of the skull through the vertex without intravenous contrast. COMPARISON:  CT head 01/11/2018 FINDINGS: Brain: Cerebral ventricle sizes are concordant with the degree of cerebral volume loss. Patchy and confluent areas of decreased attenuation are noted throughout the deep and periventricular white matter of the cerebral  hemispheres bilaterally, compatible with chronic microvascular ischemic disease. No evidence of large-territorial acute infarction. No parenchymal hemorrhage. No mass lesion. No extra-axial collection. No mass effect or midline shift. No hydrocephalus. Basilar cisterns are patent. Vascular: No hyperdense vessel. Atherosclerotic calcifications are present within the cavernous internal carotid arteries. Skull: No acute fracture or focal lesion. Sinuses/Orbits: Paranasal sinuses and mastoid air cells are clear. Bilateral lens replacement. Otherwise orbits are unremarkable. Other: None. IMPRESSION: No acute intracranial abnormality. Electronically Signed   By: Iven Finn M.D.   On: 10/15/2020 00:12   DG Chest Port 1 View  Result Date: 10/14/2020 CLINICAL DATA:  Recent syncopal episode EXAM: PORTABLE CHEST 1 VIEW COMPARISON:  08/29/2020 FINDINGS: Cardiac shadow is at the upper limits of normal in size. Aortic calcifications are noted. Previously seen right-sided pleural effusion has resolved in the interval from the prior exam. Previously seen vascular congestion has improved significantly when compare with the prior exam. There are some areas of increased density in the right suprahilar region consistent with prior radiation therapy and scarring. This is stable from multiple previous CTs dating back to at  least 2020. No focal infiltrate or effusion is noted. IMPRESSION: Resolution of previously seen right-sided pleural effusion. Post radiation changes in the right suprahilar region. No acute abnormality noted. Electronically Signed   By: Inez Catalina M.D.   On: 10/14/2020 23:31    Scheduled Meds: . ALPRAZolam  0.5 mg Oral QHS  . amiodarone  200 mg Oral BID  . apixaban  2.5 mg Oral BID  . Calcium Carbonate-Vitamin D  1 tablet Oral Daily  . FLUoxetine  40 mg Oral Daily  . furosemide  20 mg Oral Daily  . megestrol  625 mg Oral Daily  . memantine  5 mg Oral BID  . metoprolol succinate  50 mg Oral BID   . multivitamin with minerals  1 tablet Oral Daily  . pravastatin  20 mg Oral q1800  . traZODone  50 mg Oral QHS   Continuous Infusions: . sodium chloride 30 mL/hr at 10/15/20 1319  . diltiazem (CARDIZEM) infusion 10 mg/hr (10/15/20 1253)  . magnesium sulfate bolus IVPB      Assessment/Plan:  1. 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  Gentle IV fluids.  Creatinine improved from 1.47 down to 1.13.  Sodium 127 on presentation up to 128.  Gentle IV fluids.  Hold Lasix for right now. 2. Severe hypomagnesemia.  Magnesium 1.1 today.  We will give 4 g of IV magnesium. 3. Severe hypokalemia on presentation now up to 3.1.  Continue oral supplementation 4. Paroxysmal atrial fibrillation with rapid ventricular response.  Patient was on Cardizem drip this morning.  Late morning able to add usual medications of Toprol and amiodarone.  Hopefully can come off Cardizem drip this afternoon.  Patient on low-dose Eliquis. 5. Weakness.  Patient unable to ambulate.  We will get physical therapy evaluation starting for tomorrow 6. Dementia on Namenda 7. Failure to thrive on Megace 8. Syncope could be secondary to rapid heart rate or electrolyte abnormalities 9. Full CODE STATUS        Code Status:     Code Status Orders  (From admission, onward)         Start     Ordered   10/15/20 0106  Full code  Continuous        10/15/20 0109        Code Status History    Date Active Date Inactive Code Status Order ID Comments User Context   08/29/2020 1942 09/01/2020 2010 Full Code [id number]  Christel Mormon, MD ED   11/28/2018 1654 12/03/2018 1842 Full Code [id number]  Benjamine Sprague, DO Inpatient   12/27/2017 1601 01/22/2018 1704 DNR [id number]  Flora Lipps, MD Inpatient   12/27/2017 1204 12/27/2017 1601 Full Code [id number]  Benjamine Sprague, DO ED   12/27/2017 1204 12/27/2017 1204 Full Code [id number]  Benjamine Sprague, DO ED   Advance Care Planning Activity     Family Communication:  Spoke with husband on the phonespoke with husband on the phone Disposition Plan: Status is: Observation  Dispo: The patient is from: Home              Anticipated d/c is to: Home versus rehab              Patient currently being treated for atrial fibrillation with rapid ventricular response and electrolyte abnormalities   Difficult to place patient.  Hopefully not  Consultants:  Cardiology  Time spent: 32 minutes  [place]

## 2020-10-15 NOTE — ED Notes (Signed)
Informed RN bed assigned 

## 2020-10-16 ENCOUNTER — Inpatient Hospital Stay: Payer: Medicare Other

## 2020-10-16 DIAGNOSIS — I480 Paroxysmal atrial fibrillation: Secondary | ICD-10-CM

## 2020-10-16 DIAGNOSIS — I4891 Unspecified atrial fibrillation: Secondary | ICD-10-CM

## 2020-10-16 DIAGNOSIS — J181 Lobar pneumonia, unspecified organism: Secondary | ICD-10-CM

## 2020-10-16 LAB — BASIC METABOLIC PANEL
Anion gap: 11 (ref 5–15)
BUN: 13 mg/dL (ref 8–23)
CO2: 23 mmol/L (ref 22–32)
Calcium: 8.4 mg/dL — ABNORMAL LOW (ref 8.9–10.3)
Chloride: 96 mmol/L — ABNORMAL LOW (ref 98–111)
Creatinine, Ser: 0.98 mg/dL (ref 0.44–1.00)
GFR, Estimated: 58 mL/min — ABNORMAL LOW (ref >60)
Glucose, Bld: 92 mg/dL (ref 70–99)
Potassium: 3.7 mmol/L (ref 3.5–5.1)
Sodium: 130 mmol/L — ABNORMAL LOW (ref 135–145)

## 2020-10-16 LAB — MAGNESIUM: Magnesium: 2.3 mg/dL (ref 1.7–2.4)

## 2020-10-16 LAB — SODIUM, URINE, RANDOM: Sodium, Ur: 56 mmol/L

## 2020-10-16 LAB — OSMOLALITY, URINE: Osmolality, Ur: 250 mOsm/kg — ABNORMAL LOW (ref 300–900)

## 2020-10-16 MED ORDER — SODIUM CHLORIDE 0.9 % IV SOLN
3.0000 g | Freq: Four times a day (QID) | INTRAVENOUS | Status: DC
  Administered 2020-10-16 – 2020-10-17 (×3): 3 g via INTRAVENOUS
  Filled 2020-10-16: qty 8
  Filled 2020-10-16 (×2): qty 3
  Filled 2020-10-16: qty 8
  Filled 2020-10-16: qty 3
  Filled 2020-10-16: qty 8

## 2020-10-16 MED ORDER — SODIUM CHLORIDE 0.9 % IV SOLN
INTRAVENOUS | Status: DC | PRN
  Administered 2020-10-16: 250 mL via INTRAVENOUS

## 2020-10-16 MED ORDER — PANTOPRAZOLE SODIUM 40 MG PO TBEC
40.0000 mg | DELAYED_RELEASE_TABLET | Freq: Every day | ORAL | Status: DC
  Administered 2020-10-16 – 2020-10-18 (×3): 40 mg via ORAL
  Filled 2020-10-16 (×3): qty 1

## 2020-10-16 NOTE — NC FL2 (Signed)
[place] LEVEL OF CARE SCREENING TOOL     IDENTIFICATION  Patient Name: Jody [name] Birthdate: Jul 12, 1940 Sex: female Admission Date (Current Location): 10/14/2020  [place] and [place] Number:  Engineering geologist and Address:  [facility], [address], [place], [place] [address]      Provider Number: [id number]  Attending Physician Name and Address:  Loletha Grayer, MD  Relative Name and Phone Number:       Current Level of Care: Hospital Recommended Level of Care: [place] Prior Approval Number:    Date Approved/Denied:   PASRR Number: [id number]  Discharge Plan: SNF    Current Diagnoses: Patient Active Problem List   Diagnosis Date Noted  . Acute kidney injury superimposed on CKD ([place])   . Hyponatremia   . Hypomagnesemia   . Hypokalemia   . Syncope   . Weakness   . Acute on chronic congestive heart failure ([place])   . Pleural effusion on right   . Dementia without behavioral disturbance ([place])   . Atrial fibrillation with rapid ventricular response ([place]) 08/29/2020  . Chronic a-fib ([place]) 11/28/2018  . History of colostomy reversal 11/28/2018  . Perforation bowel ([place])   . Poor appetite   . Acute delirium   . Goals of care, counseling/discussion   . Palliative care encounter   . Diverticulitis of colon with perforation 12/27/2017  . Atrial fibrillation with RVR ([place]) 12/27/2017  . Malignant neoplasm of upper lobe of right lung ([place]) 12/31/2015  . 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 TI (tricuspid incompetence) 01/21/2015  . Anxiety 01/13/2015  . Clinical depression 01/13/2015  . 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 Borderline diabetes mellitus 01/13/2015  . Benign essential HTN 01/06/2015  . Essential (primary) hypertension 01/06/2015  . Atrial fibrillation, chronic ([place]) 05/20/2014  . 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 Chronic atrial fibrillation ([place]) 05/20/2014  . MI  (mitral incompetence) 11/26/2013  . Cough 11/20/2013  . Breath shortness 11/01/2013  . Breathlessness on exertion 10/30/2013    Orientation RESPIRATION BLADDER Height & Weight     Self  Normal Continent Weight: 116 lb 4.8 oz (52.8 kg) Height:  4\' 11"  (149.9 cm)  BEHAVIORAL SYMPTOMS/MOOD NEUROLOGICAL BOWEL NUTRITION STATUS      Continent Diet (heart healthy, thin liquids)  AMBULATORY STATUS COMMUNICATION OF NEEDS Skin   Limited Assist Verbally Normal                       Personal Care Assistance Level of Assistance  Dressing,Feeding,Bathing Bathing Assistance: Limited assistance Feeding assistance: Independent Dressing Assistance: Limited assistance     Functional Limitations Info  Sight,Hearing,Speech Sight Info: Adequate Hearing Info: Adequate Speech Info: Adequate    SPECIAL CARE FACTORS FREQUENCY  PT (By licensed PT),OT (By licensed OT)     PT Frequency: 5x OT Frequency: 5x            Contractures Contractures Info: Not present    Additional Factors Info  Code Status,Allergies Code Status Info: Full Code Allergies Info: Donepezil, Poison Oak Extract, Ampicillin, Penicillin V Potassium           Current Medications (10/16/2020):  This is the current hospital active medication list Current Facility-Administered Medications  Medication Dose Route Frequency Provider Last Rate Last Admin  . 0.9 %  sodium chloride infusion   Intravenous Continuous Loletha Grayer, MD 30 mL/hr at 10/15/20 1319 Rate Change at 10/15/20 1319  . acetaminophen (TYLENOL) tablet 650  mg  650 mg Oral Q6H PRN Mansy, Jan A, MD       Or  . acetaminophen (TYLENOL) suppository 650 mg  650 mg Rectal Q6H PRN Mansy, Jan A, MD      . albuterol (PROVENTIL) (2.5 MG/3ML) 0.083% nebulizer solution 2.5 mg  2.5 mg Nebulization Q6H PRN Mansy, Jan A, MD      . ALPRAZolam Duanne Moron) tablet 0.5 mg  0.5 mg Oral QHS Mansy, Jan A, MD   0.5 mg at 10/15/20 2100  . amiodarone (PACERONE) tablet 200 mg  200  mg Oral BID Benita Gutter, RPH   200 mg at 10/16/20 0953  . apixaban (ELIQUIS) tablet 2.5 mg  2.5 mg Oral BID Benita Gutter, RPH   2.5 mg at 10/16/20 3794  . calcium-vitamin D (OSCAL WITH D) 500-200 MG-UNIT per tablet 1 tablet  1 tablet Oral Daily Mansy, Jan A, MD   1 tablet at 10/16/20 0953  . diltiazem (CARDIZEM) 125 mg in dextrose 5% 125 mL (1 mg/mL) infusion  5-15 mg/hr Intravenous Continuous Paulette Blanch, MD   Stopped at 10/16/20 0810  . feeding supplement (ENSURE ENLIVE / ENSURE PLUS) liquid 237 mL  237 mL Oral BID BM Loletha Grayer, MD   237 mL at 10/16/20 0954  . FLUoxetine (PROZAC) capsule 40 mg  40 mg Oral Daily Mansy, Jan A, MD   40 mg at 10/16/20 0953  . magnesium hydroxide (MILK OF MAGNESIA) suspension 30 mL  30 mL Oral Daily PRN Mansy, Jan A, MD      . megestrol (MEGACE) 400 MG/10ML suspension 625 mg  625 mg Oral Daily Loletha Grayer, MD   625 mg at 10/16/20 0953  . memantine (NAMENDA) tablet 5 mg  5 mg Oral BID Mansy, Jan A, MD   5 mg at 10/16/20 0953  . metoprolol succinate (TOPROL-XL) 24 hr tablet 50 mg  50 mg Oral BID Lorna Dibble, RPH   50 mg at 10/16/20 0953  . multivitamin with minerals tablet 1 tablet  1 tablet Oral Daily Mansy, Jan A, MD   1 tablet at 10/16/20 0953  . ondansetron (ZOFRAN) tablet 4 mg  4 mg Oral Q6H PRN Mansy, Jan A, MD       Or  . ondansetron [facility]) injection 4 mg  4 mg Intravenous Q6H PRN Mansy, Jan A, MD      . traMADol Veatrice Bourbon) tablet 50 mg  50 mg Oral Q6H PRN Mansy, Jan A, MD   50 mg at 10/15/20 0741  . traZODone (DESYREL) tablet 50 mg  50 mg Oral QHS Mansy, Jan A, MD   50 mg at 10/15/20 2100     Discharge Medications: Please see discharge summary for a list of discharge medications.  Relevant Imaging Results:  Relevant Lab Results:   Additional Information FEX:[id number]  Eileen Stanford, LCSW

## 2020-10-16 NOTE — Consult Note (Signed)
Pharmacy Antibiotic Note  Jody [name] is a 80 y.o. female admitted on 10/14/2020 with pneumonia.  Pharmacy has been consulted for Unasyn dosing.  Plan: Unasyn 3 g q6H. Monitor Scr.   Height: 4\' 11"  (149.9 cm) Weight: 52.8 kg (116 lb 4.8 oz) IBW/kg (Calculated) : 43.2  Temp (24hrs), Avg:98 F (36.7 C), Min:97.6 F (36.4 C), Max:98.5 F (36.9 C)  Recent Labs  Lab 10/14/20 2234 10/15/20 0522 10/16/20 0359  WBC 12.3* 9.8  --   CREATININE 1.47* 1.13* 0.98    Estimated Creatinine Clearance: 32.8 mL/min (by C-G formula based on SCr of 0.98 mg/dL).    Allergies  Allergen Reactions  . Donepezil     Loss of appetite  . Poison Oak Extract Other (See Comments)    blistering  . Ampicillin Rash  . Penicillin V Potassium Rash    Has patient had a PCN reaction causing immediate rash, facial/tongue/throat swelling, SOB or lightheadedness with hypotension: No Has patient had a PCN reaction causing severe rash involving mucus membranes or skin necrosis: No Has patient had a PCN reaction that required hospitalization: No Has patient had a PCN reaction occurring within the last 10 years: No If all of the above answers are "NO", then may proceed with Cephalosporin use.    Microbiology results: None   Thank you for allowing pharmacy to be a part of this patient's care.  Oswald Hillock 10/16/2020 3:18 PM

## 2020-10-16 NOTE — TOC Initial Note (Signed)
Transition of Care [facility]) - Initial/Assessment Note    Patient Details  Name: Jody [name] MRN: [id number] Date of Birth: [birth year]  [facility] CM/SW Contact:    Eileen Stanford, LCSW Phone Number: 10/16/2020, 10:41 AM  Clinical Narrative:      Pt is only alert to self. CSW spoke with pt's spouse. Pt's spouse would like for pt to go to [facility], stating he has been there in the past. CSW will send referral.  Pt's spouse transport her to doctor appointments. Pt sees Dr Edwina Barth. Pt gets her med[facility] at [facility] in [place].              Expected Discharge Plan: Skilled Nursing Facility Barriers to Discharge: Continued Medical Work up   Patient Goals and CMS Choice Patient states their goals for this hospitalization and ongoing recovery are:: for pt to get better   Choice offered to / list presented to : Spouse  Expected Discharge Plan and Services Expected Discharge Plan: [place] In-house Referral: Clinical Social Work   Post Acute Care Choice: [place] Living arrangements for the past 2 months: [place]                                      Prior Living Arrangements/Services Living arrangements for the past 2 months: Single Family Home Lives with:: Spouse Patient language and need for interpreter reviewed:: Yes Do you feel safe going back to the place where you live?: Yes      Need for Family Participation in Patient Care: Yes (Comment) Care giver support system in place?: Yes (comment)   Criminal Activity/Legal Involvement Pertinent to Current Situation/Hospitalization: No - Comment as needed  Activities of Daily Living      Permission Sought/Granted Permission sought to share information with : Family Supports Permission granted to share information with : Yes, Release of Information Signed  Share Information with NAME: dwight  Permission granted to share info w AGENCY: [facility]  Permission  granted to share info w Relationship: spouse     Emotional Assessment Appearance:: Appears stated age Attitude/Demeanor/Rapport: Engaged Affect (typically observed): Accepting Orientation: : Oriented to Self Alcohol / Substance Use: Not Applicable Psych Involvement: No (comment)  Admission diagnosis:  Hypokalemia [E87.6] Atrial fibrillation with rapid ventricular response (HCC) [I48.91] Atrial fibrillation with RVR ([place]) [I48.91] Syncope, unspecified syncope type [R55] Hypomagnesemia [E83.42] Patient Active Problem List   Diagnosis Date Noted  . Acute kidney injury superimposed on CKD ([place])   . Hyponatremia   . Hypomagnesemia   . Hypokalemia   . Syncope   . Weakness   . Acute on chronic congestive heart failure ([place])   . Pleural effusion on right   . Dementia without behavioral disturbance ([place])   . Atrial fibrillation with rapid ventricular response ([place]) 08/29/2020  . Chronic a-fib ([place]) 11/28/2018  . History of colostomy reversal 11/28/2018  . Perforation bowel ([place])   . Poor appetite   . Acute delirium   . Goals of care, counseling/discussion   . Palliative care encounter   . Diverticulitis of colon with perforation 12/27/2017  . Atrial fibrillation with RVR ([place]) 12/27/2017  . Malignant neoplasm of upper lobe of right lung ([place]) 12/31/2015  . 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 TI (tricuspid incompetence) 01/21/2015  . Anxiety 01/13/2015  . Clinical depression 01/13/2015  . 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 Borderline diabetes mellitus  01/13/2015  . Benign essential HTN 01/06/2015  . Essential (primary) hypertension 01/06/2015  . Atrial fibrillation, chronic ([place]) 05/20/2014  . 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 Chronic atrial fibrillation ([place]) 05/20/2014  . 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 Cough 11/20/2013  . Breath shortness 11/01/2013  . Breathlessness on exertion 10/30/2013   PCP:  Baxter Hire, MD Pharmacy:    [id number] [address], [place] [address] Phone: [phone] Fax: [place], [place] - [address] [place] [address] Phone: [phone] Fax: [phone]     Social Determinants of Health (SDOH) Interventions    Readmission Risk Interventions Readmission Risk Prevention Plan 10/16/2020  Transportation Screening Complete  PCP or Specialist Appt within 3-5 Days Complete  HRI or [place] Complete  Social Work Consult for [place] Planning/Counseling Complete  Palliative Care Screening Not Applicable  Medication Review Press photographer) Complete  Some recent data might be hidden

## 2020-10-16 NOTE — Progress Notes (Signed)
[facility]    SUBJECTIVE: The patient denies pain at this time. She just reports feeling sleepy. She has baseline dementia and is unable to provide elaborate history.   Vitals:   10/15/20 1627 10/15/20 1958 10/16/20 0004 10/16/20 0523  BP:  111/73 106/79 123/60  Pulse: 70 74 82 82  Resp:  18 20 18   Temp:  97.7 F (36.5 C) 98.5 F (36.9 C) 98.2 F (36.8 C)  TempSrc:  Oral Oral Oral  SpO2:  98% 98% 100%  Weight:      Height:         Intake/Output Summary (Last 24 hours) at 10/16/2020 0809 Last data filed at 10/16/2020 0554 Gross per 24 hour  Intake 472.15 ml  Output 1200 ml  Net -727.85 ml      PHYSICAL EXAM  General: Frail elderly female lying flat in bed, smiling, in no acute distress HEENT:  Normocephalic and atramatic Neck:  No JVD.  Lungs: normal effort of breathing on room air, no wheezing Heart: irregularly irregular without gallops or murmurs.  Abdomen: nondistended Extremities: No clubbing, cyanosis or edema.   Neuro: Alert, oriented to name, not to location, date or situation   LABS: Basic Metabolic Panel: Recent Labs    10/15/20 0522 10/16/20 0359  NA 128* 130*  K 3.1* 3.7  CL 96* 96*  CO2 21* 23  GLUCOSE 105* 92  BUN 15 13  CREATININE 1.13* 0.98  CALCIUM 8.0* 8.4*  MG 1.1* 2.3   Liver Function Tests: No results for input(s): AST, ALT, ALKPHOS, BILITOT, PROT, ALBUMIN in the last 72 hours. No results for input(s): LIPASE, AMYLASE in the last 72 hours. CBC: Recent Labs    10/14/20 2234 10/15/20 0522  WBC 12.3* 9.8  HGB 15.0 13.2  HCT 42.3 37.1  MCV 80.4 79.8*  PLT 333 303   Cardiac Enzymes: No results for input(s): CKTOTAL, CKMB, CKMBINDEX, TROPONINI in the last 72 hours. BNP: Invalid input(s): POCBNP D-Dimer: No results for input(s): DDIMER in the last 72 hours. Hemoglobin A1C: No results for input(s): HGBA1C in the last 72 hours. Fasting Lipid Panel: No results for input(s): CHOL, HDL, LDLCALC, TRIG, CHOLHDL, LDLDIRECT in  the last 72 hours. Thyroid Function Tests: Recent Labs    10/14/20 2234  TSH 6.338*   Anemia Panel: No results for input(s): VITAMINB12, FOLATE, FERRITIN, TIBC, IRON, RETICCTPCT in the last 72 hours.  CT Head Wo Contrast  Result Date: 10/15/2020 CLINICAL DATA:  Mental status change. Unknown cause. Syncopal episode. Status post ground level fall. EXAM: CT HEAD WITHOUT CONTRAST TECHNIQUE: Contiguous axial images were obtained from the base of the skull through the vertex without intravenous contrast. COMPARISON:  CT head 01/11/2018 FINDINGS: Brain: Cerebral ventricle sizes are concordant with the degree of cerebral volume loss. Patchy and confluent areas of decreased attenuation are noted throughout the deep and periventricular white matter of the cerebral hemispheres bilaterally, compatible with chronic microvascular ischemic disease. No evidence of large-territorial acute infarction. No parenchymal hemorrhage. No mass lesion. No extra-axial collection. No mass effect or midline shift. No hydrocephalus. Basilar cisterns are patent. Vascular: No hyperdense vessel. Atherosclerotic calcifications are present within the cavernous internal carotid arteries. Skull: No acute fracture or focal lesion. Sinuses/Orbits: Paranasal sinuses and mastoid air cells are clear. Bilateral lens replacement. Otherwise orbits are unremarkable. Other: None. IMPRESSION: No acute intracranial abnormality. Electronically Signed   By: Iven Finn M.D.   On: 10/15/2020 00:12   DG Chest Port 1 View  Result Date: 10/14/2020 CLINICAL  DATA:  Recent syncopal episode EXAM: PORTABLE CHEST 1 VIEW COMPARISON:  08/29/2020 FINDINGS: Cardiac shadow is at the upper limits of normal in size. Aortic calcifications are noted. Previously seen right-sided pleural effusion has resolved in the interval from the prior exam. Previously seen vascular congestion has improved significantly when compare with the prior exam. There are some areas of  increased density in the right suprahilar region consistent with prior radiation therapy and scarring. This is stable from multiple previous CTs dating back to at least 2020. No focal infiltrate or effusion is noted. IMPRESSION: Resolution of previously seen right-sided pleural effusion. Post radiation changes in the right suprahilar region. No acute abnormality noted. Electronically Signed   By: Inez Catalina M.D.   On: 10/14/2020 23:31     TELEMETRY: atrial fibrillation, 80-90s  ASSESSMENT AND PLAN:  Active Problems:   Atrial fibrillation with RVR (HCC)   Dementia without behavioral disturbance (HCC)   Acute kidney injury superimposed on CKD (HCC)   Hyponatremia   Hypomagnesemia   Hypokalemia   Syncope   Weakness    1. Atrial fibrillation with RVR, status post successful cardioversion 09/24/20, currently on diltiazem drip, PO amiodarone, digoxin, and metoprolol succinate, and Eliquis for stroke prevention. Currently in rate controlled atrial fibrillation. Intermittent pauses. 2. Syncope, in the setting of severe hypokalemia, hypomagnesemia, and hyponatremia, with recent poor PO intake, and atrial fibrillation with RVR. 3. Dementia 4. Severe hypokalemia, 2.5-> 3.7 5. Severe hypomagnesemia, 1.1-> 2.3 6. Hyponatremia, 127-> 130 7. Acute kidney injury, creatinine 1.47 (up from 0.81 in 08/2020), now improved to 0.98 with IV fluids 8. Chronic systolic and diastolic CHF, does not appear fluid overloaded. BNP elevated to 445 (down from 1896 2 months ago).  Recommendations: 1. Discontinue diltiazem drip 2. Continue amiodarone, metoprolol, and digoxin at current doses 3. Continue Eliquis 2.5 mg BID for stroke prevention  4. Avoid fluid overload 5. Defer further cardiac diagnostics at this time   Clabe Seal, Hershal Coria 10/16/2020 8:09 AM

## 2020-10-16 NOTE — Progress Notes (Signed)
Patient ID: TYLER ROBIDOUX, female   DOB: Apr 17, 1941, 80 y.o.   MRN: [id number] [facility] PROGRESS NOTE  REVERIE VAQUERA GEZ:[id number] DOB: Nov 01, 1940 DOA: 10/14/2020 PCP: Baxter Hire, MD  HPI/Subjective: Patient seen this morning while she was eating breakfast.  The patient's husband states that this is the most that she has been eating in a while.  Patient feels okay and offers no complaints.  Objective: Vitals:   10/16/20 0818 10/16/20 1259  BP: 101/78   Pulse: 79 91  Resp: 20 (!) 22  Temp: 97.6 F (36.4 C) 98.2 F (36.8 C)  SpO2: 97% 99%    Intake/Output Summary (Last 24 hours) at 10/16/2020 1457 Last data filed at 10/16/2020 1330 Gross per 24 hour  Intake 952.15 ml  Output 1200 ml  Net -247.85 ml   Filed Weights   10/14/20 2232 10/15/20 1156  Weight: 57.6 kg 52.8 kg    ROS: Review of Systems  Respiratory: Negative for shortness of breath.   Cardiovascular: Negative for chest pain.  Gastrointestinal: Negative for abdominal pain, nausea and vomiting.   Exam: Physical Exam HENT:     Head: Normocephalic.     Mouth/Throat:     Pharynx: No oropharyngeal exudate.  Eyes:     General: Lids are normal.     Conjunctiva/sclera: Conjunctivae normal.     Pupils: Pupils are equal, round, and reactive to light.  Cardiovascular:     Rate and Rhythm: Normal rate and regular rhythm.     Heart sounds: Normal heart sounds, S1 normal and S2 normal.  Pulmonary:     Breath sounds: Examination of the right-lower field reveals decreased breath sounds. Examination of the left-lower field reveals decreased breath sounds. Decreased breath sounds present. No wheezing, rhonchi or rales.  Abdominal:     Palpations: Abdomen is soft.     Tenderness: There is no abdominal tenderness.  Musculoskeletal:     Right lower leg: No swelling.     Left lower leg: No swelling.  Skin:    General: Skin is warm.     Findings: No rash.  Neurological:     Mental Status: She is alert.      Comments: Answers some yes or no questions.       Data Reviewed: Basic Metabolic Panel: Recent Labs  Lab 10/14/20 2234 10/15/20 0522 10/16/20 0359  NA 127* 128* 130*  K 2.5* 3.1* 3.7  CL 93* 96* 96*  CO2 20* 21* 23  GLUCOSE 112* 105* 92  BUN 16 15 13   CREATININE 1.47* 1.13* 0.98  CALCIUM 8.0* 8.0* 8.4*  MG  --  1.1* 2.3   CBC: Recent Labs  Lab 10/14/20 2234 10/15/20 0522  WBC 12.3* 9.8  HGB 15.0 13.2  HCT 42.3 37.1  MCV 80.4 79.8*  PLT 333 303   BNP (last 3 results) Recent Labs    08/29/20 1742 10/14/20 2234  BNP 1,896.8* 445.0*    CBG: Recent Labs  Lab 10/14/20 2244  GLUCAP 102*    Recent Results (from the past 240 hour(s))  Resp Panel by RT-PCR (Flu A&B, Covid) Nasopharyngeal Swab     Status: None   Collection Time: 10/14/20 11:35 PM   Specimen: Nasopharyngeal Swab; Nasopharyngeal(NP) swabs in vial transport medium  Result Value Ref Range Status   SARS Coronavirus 2 by RT PCR NEGATIVE NEGATIVE Final    Comment: (NOTE) SARS-CoV-2 target nucleic acids are NOT DETECTED.  The SARS-CoV-2 RNA is generally detectable in upper respiratory specimens during the  acute phase of infection. The lowest concentration of SARS-CoV-2 viral copies this assay can detect is 138 copies/mL. A negative result does not preclude SARS-Cov-2 infection and should not be used as the sole basis for treatment or other patient management decisions. A negative result may occur with  improper specimen collection/handling, submission of specimen other than nasopharyngeal swab, presence of viral mutation(s) within the areas targeted by this assay, and inadequate number of viral copies(<138 copies/mL). A negative result must be combined with clinical observations, patient history, and epidemiological information. The expected result is Negative.  Fact Sheet for Patients:  EntrepreneurPulse.com.au  Fact Sheet for Healthcare Providers:   IncredibleEmployment.be  This test is no t yet approved or cleared by the [place] FDA and  has been authorized for detection and/or diagnosis of SARS-CoV-2 by FDA under an Emergency Use Authorization (EUA). This EUA will remain  in effect (meaning this test can be used) for the duration of the COVID-19 declaration under Section 564(b)(1) of the Act, 21 U.S.C.section 360bbb-3(b)(1), unless the authorization is terminated  or revoked sooner.       Influenza A by PCR NEGATIVE NEGATIVE Final   Influenza B by PCR NEGATIVE NEGATIVE Final    Comment: (NOTE) The Xpert Xpress SARS-CoV-2/FLU/RSV plus assay is intended as an aid in the diagnosis of influenza from Nasopharyngeal swab specimens and should not be used as a sole basis for treatment. Nasal washings and aspirates are unacceptable for Xpert Xpress SARS-CoV-2/FLU/RSV testing.  Fact Sheet for Patients: EntrepreneurPulse.com.au  Fact Sheet for Healthcare Providers: IncredibleEmployment.be  This test is not yet approved or cleared by the [place] FDA and has been authorized for detection and/or diagnosis of SARS-CoV-2 by FDA under an Emergency Use Authorization (EUA). This EUA will remain in effect (meaning this test can be used) for the duration of the COVID-19 declaration under Section 564(b)(1) of the Act, 21 U.S.C. section 360bbb-3(b)(1), unless the authorization is terminated or revoked.  Performed at [facility], [address], [place], [place] [address]      Studies: CT Head Wo Contrast  Result Date: 10/15/2020 CLINICAL DATA:  Mental status change. Unknown cause. Syncopal episode. Status post ground level fall. EXAM: CT HEAD WITHOUT CONTRAST TECHNIQUE: Contiguous axial images were obtained from the base of the skull through the vertex without intravenous contrast. COMPARISON:  CT head 01/11/2018 FINDINGS: Brain: Cerebral ventricle sizes are  concordant with the degree of cerebral volume loss. Patchy and confluent areas of decreased attenuation are noted throughout the deep and periventricular white matter of the cerebral hemispheres bilaterally, compatible with chronic microvascular ischemic disease. No evidence of large-territorial acute infarction. No parenchymal hemorrhage. No mass lesion. No extra-axial collection. No mass effect or midline shift. No hydrocephalus. Basilar cisterns are patent. Vascular: No hyperdense vessel. Atherosclerotic calcifications are present within the cavernous internal carotid arteries. Skull: No acute fracture or focal lesion. Sinuses/Orbits: Paranasal sinuses and mastoid air cells are clear. Bilateral lens replacement. Otherwise orbits are unremarkable. Other: None. IMPRESSION: No acute intracranial abnormality. Electronically Signed   By: Iven Finn M.D.   On: 10/15/2020 00:12   CT CHEST WO CONTRAST  Result Date: 10/16/2020 CLINICAL DATA:  Lung nodule, follow-up; history of RIGHT lung cancer post radiation therapy EXAM: CT CHEST WITHOUT CONTRAST TECHNIQUE: Multidetector CT imaging of the chest was performed following the standard protocol without IV contrast. Sagittal and coronal MPR images reconstructed from axial data set. COMPARISON:  05/08/2019, 11/03/2018 FINDINGS: Cardiovascular: Atherosclerotic calcifications aorta, coronary arteries, proximal great vessels.  Aorta normal caliber. Heart unremarkable. No pericardial effusion. Mediastinum/Nodes: Large hiatal hernia. Esophagus unremarkable. Tiny BILATERAL thyroid nodules up to 8 mm diameter; Not clinically significant; no follow-up imaging recommended (ref: J Am Coll Radiol. 2015 Feb;12(2): 143-50).No definite thoracic adenopathy. Slightly prominent RIGHT pulmonary hilum but not grossly changed. Lungs/Pleura: Radiation fibrosis changes again identified RIGHT upper lobe with associated parenchymal calcifications. Changes extend to the RIGHT hilum. 4 mm RIGHT  upper lobe nodule image 40 unchanged. 6 x 4 mm RIGHT upper lobe nodule image 39, measured 4 x 4 mm on the previous exam but 6 x 4 mm on the earlier study of 11/03/2018. RIGHT upper lobe nodule 7 x 4 mm image 46 unchanged. Additional scattered nodular foci and calcified granulomata in both lungs including a 6 mm LEFT upper lobe nodule image 39 unchanged. New patchy area of consolidation in RIGHT lower lobe laterally consistent with pneumonia. No pleural effusion or pneumothorax. No definite new or additional enlarging nodules seen. Upper Abdomen: 7.0 cm diameter partially calcified splenic mass with globular enlargement of spleen unchanged. Post cholecystectomy. Atherosclerotic calcifications aorta and SMA. Musculoskeletal: Osseous demineralization. IMPRESSION: Radiation fibrosis changes RIGHT upper lobe. Stable BILATERAL pulmonary nodules. New patchy area of consolidation in RIGHT lower lobe laterally consistent with pneumonia. Large hiatal hernia. 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 Aortic Atherosclerosis (ICD10-I70.0). Electronically Signed   By: Lavonia Dana M.D.   On: 10/16/2020 13:03   DG Chest Port 1 View  Result Date: 10/14/2020 CLINICAL DATA:  Recent syncopal episode EXAM: PORTABLE CHEST 1 VIEW COMPARISON:  08/29/2020 FINDINGS: Cardiac shadow is at the upper limits of normal in size. Aortic calcifications are noted. Previously seen right-sided pleural effusion has resolved in the interval from the prior exam. Previously seen vascular congestion has improved significantly when compare with the prior exam. There are some areas of increased density in the right suprahilar region consistent with prior radiation therapy and scarring. This is stable from multiple previous CTs dating back to at least 2020. No focal infiltrate or effusion is noted. IMPRESSION: Resolution of previously seen right-sided pleural effusion. Post radiation changes in the right suprahilar region. No acute abnormality  noted. Electronically Signed   By: Inez Catalina M.D.   On: 10/14/2020 23:31    Scheduled Meds: . ALPRAZolam  0.5 mg Oral QHS  . amiodarone  200 mg Oral BID  . apixaban  2.5 mg Oral BID  . calcium-vitamin D  1 tablet Oral Daily  . feeding supplement  237 mL Oral BID BM  . FLUoxetine  40 mg Oral Daily  . megestrol  625 mg Oral Daily  . memantine  5 mg Oral BID  . metoprolol succinate  50 mg Oral BID  . multivitamin with minerals  1 tablet Oral Daily  . pantoprazole  40 mg Oral Daily  . traZODone  50 mg Oral QHS    Assessment/Plan:  1. 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  Patient was given IV fluids during the time here.  Creatinine improved down from 1.47 to 0.98.  Sodium 127 on presentation and up to 130 now.  CT scan of the chest shows stable pulmonary nodules, radiation changes right upper lobe and pneumonia right lower lobe. 2. Pneumonia right lower lobe suspect aspiration.  Start Unasyn.  Likely switch over to Augmentin upon disposition. 3. Hypomagnesemia.  Patient received 4 g of IV magnesium yesterday and magnesium in the normal range today 4. Hypokalemia.  Potassium up to 3.7  today. 5. Paroxysmal atrial fibrillation.  Patient had rapid ventricular response and was on Cardizem drip yesterday.  Patient was off Cardizem drip this morning.  Continue usual medications of Toprol and amiodarone.  Patient on low-dose Eliquis for anticoagulation. 6. Weakness.  Physical therapy recommending rehab 7. Dementia on Namenda 8. Failure to thrive on Megace 9. Syncope likely secondary to rapid heart rate and/or electrolyte abnormalities 10. History of lung cancer with stable lung nodules as per radiologist.        Code Status:     Code Status Orders  (From admission, onward)         Start     Ordered   10/15/20 0106  Full code  Continuous        10/15/20 0109        Code Status History    Date Active Date Inactive Code Status Order ID  Comments User Context   08/29/2020 1942 09/01/2020 2010 Full Code [id number]  Christel Mormon, MD ED   11/28/2018 1654 12/03/2018 1842 Full Code [id number]  Benjamine Sprague, DO Inpatient   12/27/2017 1601 01/22/2018 1704 DNR [id number]  Flora Lipps, MD Inpatient   12/27/2017 1204 12/27/2017 1601 Full Code [id number]  Benjamine Sprague, DO ED   12/27/2017 1204 12/27/2017 1204 Full Code [id number]  Benjamine Sprague, DO ED   Advance Care Planning Activity     Family Communication: Spoke with husband at the bedside this morning and on the phone this afternoon Disposition Plan: Status is: Inpatient  Dispo: The patient is from: Home              Anticipated d/c is to: Rehab              Patient currently starting to eat.  Electrolytes replaced today.  Tapered off Cardizem drip.  Starting antibiotics for pneumonia.   Difficult to place patient.  No.  Consultants:  Cardiology  Antibiotics:  Unasyn  Time spent: 27 minutes  [place]

## 2020-10-17 LAB — BASIC METABOLIC PANEL
Anion gap: 10 (ref 5–15)
BUN: 22 mg/dL (ref 8–23)
CO2: 24 mmol/L (ref 22–32)
Calcium: 8.6 mg/dL — ABNORMAL LOW (ref 8.9–10.3)
Chloride: 98 mmol/L (ref 98–111)
Creatinine, Ser: 1.1 mg/dL — ABNORMAL HIGH (ref 0.44–1.00)
GFR, Estimated: 50 mL/min — ABNORMAL LOW (ref >60)
Glucose, Bld: 97 mg/dL (ref 70–99)
Potassium: 3.7 mmol/L (ref 3.5–5.1)
Sodium: 132 mmol/L — ABNORMAL LOW (ref 135–145)

## 2020-10-17 LAB — MAGNESIUM: Magnesium: 1.8 mg/dL (ref 1.7–2.4)

## 2020-10-17 LAB — RESP PANEL BY RT-PCR (FLU A&B, COVID) ARPGX2
Influenza A by PCR: NEGATIVE
Influenza B by PCR: NEGATIVE
SARS Coronavirus 2 by RT PCR: NEGATIVE

## 2020-10-17 LAB — PHOSPHORUS: Phosphorus: 2.1 mg/dL — ABNORMAL LOW (ref 2.5–4.6)

## 2020-10-17 MED ORDER — TRAMADOL HCL 50 MG PO TABS: 50.0000 mg | ORAL_TABLET | Freq: Four times a day (QID) | ORAL | 0 refills | Status: AC | PRN

## 2020-10-17 MED ORDER — PANTOPRAZOLE SODIUM 40 MG PO TBEC: 40.0000 mg | DELAYED_RELEASE_TABLET | Freq: Every day | ORAL | 0 refills | Status: DC

## 2020-10-17 MED ORDER — MAGNESIUM OXIDE -MG SUPPLEMENT 400 (240 MG) MG PO TABS
400.0000 mg | ORAL_TABLET | Freq: Every day | ORAL | Status: DC
  Administered 2020-10-17 – 2020-10-18 (×2): 400 mg via ORAL
  Filled 2020-10-17 (×2): qty 1

## 2020-10-17 MED ORDER — AMOXICILLIN-POT CLAVULANATE 875-125 MG PO TABS: 1.0000 | ORAL_TABLET | Freq: Two times a day (BID) | ORAL | 0 refills | Status: DC

## 2020-10-17 MED ORDER — FUROSEMIDE 20 MG PO TABS: 20.0000 mg | ORAL_TABLET | Freq: Every day | ORAL | Status: AC | PRN

## 2020-10-17 MED ORDER — ENSURE ENLIVE PO LIQD: 237.0000 mL | Freq: Two times a day (BID) | ORAL | 0 refills | Status: AC

## 2020-10-17 MED ORDER — MEGESTROL ACETATE 400 MG/10ML PO SUSP: 625.0000 mg | Freq: Every day | ORAL | 0 refills | Status: AC

## 2020-10-17 MED ORDER — AMIODARONE HCL 200 MG PO TABS: 200.0000 mg | ORAL_TABLET | Freq: Two times a day (BID) | ORAL | 0 refills | Status: DC

## 2020-10-17 MED ORDER — POTASSIUM & SODIUM PHOSPHATES 280-160-250 MG PO PACK
1.0000 | PACK | Freq: Two times a day (BID) | ORAL | Status: AC
  Administered 2020-10-17 (×2): 1 via ORAL
  Filled 2020-10-17 (×2): qty 1

## 2020-10-17 MED ORDER — MAGNESIUM OXIDE -MG SUPPLEMENT 400 (240 MG) MG PO TABS: 400.0000 mg | ORAL_TABLET | Freq: Every day | ORAL | 0 refills | Status: AC

## 2020-10-17 MED ORDER — ALPRAZOLAM 0.25 MG PO TABS
0.2500 mg | ORAL_TABLET | Freq: Two times a day (BID) | ORAL | 0 refills | Status: DC | PRN
Start: 2020-10-17 — End: 2020-11-07

## 2020-10-17 MED ORDER — POTASSIUM & SODIUM PHOSPHATES 280-160-250 MG PO PACK: 1.0000 | PACK | Freq: Every day | ORAL | 0 refills | Status: AC

## 2020-10-17 MED ORDER — SODIUM CHLORIDE 0.9 % IV SOLN
3.0000 g | Freq: Two times a day (BID) | INTRAVENOUS | Status: DC
  Administered 2020-10-17 – 2020-10-18 (×2): 3 g via INTRAVENOUS
  Filled 2020-10-17: qty 3
  Filled 2020-10-17: qty 8
  Filled 2020-10-17: qty 3

## 2020-10-17 MED ORDER — AMIODARONE HCL 200 MG PO TABS
400.0000 mg | ORAL_TABLET | Freq: Two times a day (BID) | ORAL | Status: DC
  Administered 2020-10-17 – 2020-10-18 (×2): 400 mg via ORAL
  Filled 2020-10-17 (×2): qty 2

## 2020-10-17 MED ORDER — TRAZODONE HCL 50 MG PO TABS: 50.0000 mg | ORAL_TABLET | Freq: Every day | ORAL | 0 refills | Status: AC

## 2020-10-17 NOTE — Progress Notes (Signed)
[facility]  SUBJECTIVE: Laying in bed, denies chest pain, shortness of breath, palpitations, or heart racing   Vitals:   10/16/20 2115 10/17/20 0029 10/17/20 0434 10/17/20 0436  BP: 91/68 92/80 115/66   Pulse: 96 95 68 81  Resp: 18 18  (!) 21  Temp: 97.6 F (36.4 C) 98.7 F (37.1 C) 97.9 F (36.6 C)   TempSrc: Oral Oral Oral   SpO2: 97% 99% 97% 96%  Weight:      Height:         Intake/Output Summary (Last 24 hours) at 10/17/2020 3500 Last data filed at 10/17/2020 0548 Gross per 24 hour  Intake 921.91 ml  Output --  Net 921.91 ml      PHYSICAL EXAM  General: Well developed, well nourished, in no acute distress HEENT:  Normocephalic and atramatic Neck:  No JVD.  Lungs: Clear bilaterally to auscultation and percussion. Heart: HRRR . Normal S1 and S2 without gallops or murmurs.  Abdomen: Bowel sounds are positive, abdomen soft and non-tender  Msk:  Back normal, normal gait. Normal strength and tone for age. Extremities: No clubbing, cyanosis or edema.   Neuro: Alert and oriented X 3. Psych:  Good affect, responds appropriately   LABS: Basic Metabolic Panel: Recent Labs    10/16/20 0359 10/17/20 0354  NA 130* 132*  K 3.7 3.7  CL 96* 98  CO2 23 24  GLUCOSE 92 97  BUN 13 22  CREATININE 0.98 1.10*  CALCIUM 8.4* 8.6*  MG 2.3 1.8  PHOS  --  2.1*   Liver Function Tests: No results for input(s): AST, ALT, ALKPHOS, BILITOT, PROT, ALBUMIN in the last 72 hours. No results for input(s): LIPASE, AMYLASE in the last 72 hours. CBC: Recent Labs    10/14/20 2234 10/15/20 0522  WBC 12.3* 9.8  HGB 15.0 13.2  HCT 42.3 37.1  MCV 80.4 79.8*  PLT 333 303   Cardiac Enzymes: No results for input(s): CKTOTAL, CKMB, CKMBINDEX, TROPONINI in the last 72 hours. BNP: Invalid input(s): POCBNP D-Dimer: No results for input(s): DDIMER in the last 72 hours. Hemoglobin A1C: No results for input(s): HGBA1C in the last 72 hours. Fasting Lipid Panel: No results for  input(s): CHOL, HDL, LDLCALC, TRIG, CHOLHDL, LDLDIRECT in the last 72 hours. Thyroid Function Tests: Recent Labs    10/14/20 2234  TSH 6.338*   Anemia Panel: No results for input(s): VITAMINB12, FOLATE, FERRITIN, TIBC, IRON, RETICCTPCT in the last 72 hours.  CT CHEST WO CONTRAST  Result Date: 10/16/2020 CLINICAL DATA:  Lung nodule, follow-up; history of RIGHT lung cancer post radiation therapy EXAM: CT CHEST WITHOUT CONTRAST TECHNIQUE: Multidetector CT imaging of the chest was performed following the standard protocol without IV contrast. Sagittal and coronal MPR images reconstructed from axial data set. COMPARISON:  05/08/2019, 11/03/2018 FINDINGS: Cardiovascular: Atherosclerotic calcifications aorta, coronary arteries, proximal great vessels. Aorta normal caliber. Heart unremarkable. No pericardial effusion. Mediastinum/Nodes: Large hiatal hernia. Esophagus unremarkable. Tiny BILATERAL thyroid nodules up to 8 mm diameter; Not clinically significant; no follow-up imaging recommended (ref: J Am Coll Radiol. 2015 Feb;12(2): 143-50).No definite thoracic adenopathy. Slightly prominent RIGHT pulmonary hilum but not grossly changed. Lungs/Pleura: Radiation fibrosis changes again identified RIGHT upper lobe with associated parenchymal calcifications. Changes extend to the RIGHT hilum. 4 mm RIGHT upper lobe nodule image 40 unchanged. 6 x 4 mm RIGHT upper lobe nodule image 39, measured 4 x 4 mm on the previous exam but 6 x 4 mm on the earlier study of 11/03/2018. RIGHT upper  lobe nodule 7 x 4 mm image 46 unchanged. Additional scattered nodular foci and calcified granulomata in both lungs including a 6 mm LEFT upper lobe nodule image 39 unchanged. New patchy area of consolidation in RIGHT lower lobe laterally consistent with pneumonia. No pleural effusion or pneumothorax. No definite new or additional enlarging nodules seen. Upper Abdomen: 7.0 cm diameter partially calcified splenic mass with globular enlargement  of spleen unchanged. Post cholecystectomy. Atherosclerotic calcifications aorta and SMA. Musculoskeletal: Osseous demineralization. IMPRESSION: Radiation fibrosis changes RIGHT upper lobe. Stable BILATERAL pulmonary nodules. New patchy area of consolidation in RIGHT lower lobe laterally consistent with pneumonia. Large hiatal hernia. 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 Aortic Atherosclerosis (ICD10-I70.0). Electronically Signed   By: Lavonia Dana M.D.   On: 10/16/2020 13:03     Echo   TELEMETRY: Atrial fibrillation 100 bpm:  ASSESSMENT AND PLAN:  Active Problems:   Atrial fibrillation with RVR (HCC)   Dementia without behavioral disturbance (HCC)   Acute kidney injury superimposed on CKD (HCC)   Hyponatremia   Hypomagnesemia   Hypokalemia   Syncope   Weakness   Lobar pneumonia (HCC)   AF (paroxysmal atrial fibrillation) ([place])    1. Atrial fibrillation with RVR, status post successful cardioversion 09/24/20, currently on PO amiodarone and metoprolol succinate, and Eliquis for stroke prevention. Currently in rate controlled atrial fibrillation. Intermittent pauses. 2.Syncope,in the setting of severe hypokalemia, hypomagnesemia, and hyponatremia, with recent poor PO intake, and atrial fibrillation with RVR. 3. Dementia 4. Severe hypokalemia, 2.5-> 3.7 5. Severe hypomagnesemia, 1.1-> 2.3 6. Hyponatremia, 127-> 132 7. Acute kidney injury, creatinine 1.47 (up from 0.81 in 08/2020), now improved to 1.10 with IV fluids 8. Chronic systolic and diastolic CHF, does not appear fluid overloaded. BNP elevated to 445 (down from 1896 2 months ago).  Recommendations: 1. Continue current medication 2. Continue amiodarone and metoprolol at current doses 3. Continue Eliquis 2.5 mg BID for stroke prevention  4. Avoid fluid overload 5. Defer further cardiac diagnostics at this time   Isaias Cowman, MD, PhD, [facility] 10/17/2020 8:11 AM

## 2020-10-17 NOTE — Care Management Important Message (Signed)
Important Message  Patient Details  Name: JESSICAH CROLL MRN: [id number] Date of Birth: December 26, 1940   Medicare Important Message Given:  N/A - LOS <3 / Initial given by admissions  Initial Medicare IM reviewed with Marylene Buerger, son, at [phone] by Glenard Haring, Patient Access Associate on 10/16/2020 at 4:23pm.   Dannette Barbara 10/17/2020, 11:58 AM

## 2020-10-17 NOTE — Discharge Instructions (Signed)

## 2020-10-17 NOTE — Evaluation (Addendum)
Clinical/Bedside Swallow Evaluation Patient Details  Name: Jody [name] MRN: [id number] Date of Birth: December 16, 1940  Today's Date: 10/17/2020 Time: SLP Start Time (ACUTE ONLY): 0830 SLP Stop Time (ACUTE ONLY): 0930 SLP Time Calculation (min) (ACUTE ONLY): 60 min  Past Medical History:  Past Medical History:  Diagnosis Date  . Asthma   . Atrial fibrillation ([place])   . CHF (congestive heart failure) ([place])   . 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 Dysrhythmia   . GERD (gastroesophageal reflux disease)   . Hypertension   . Memory difficulties   . Primary adenocarcinoma of right lung ([place]) 02/2005   Rad tx's.   . Uses walker    Past Surgical History:  Past Surgical History:  Procedure Laterality Date  . CARDIOVERSION N/A 09/24/2020   Procedure: CARDIOVERSION;  Surgeon: Corey Skains, MD;  Location: [facility];  Service: Cardiovascular;  Laterality: N/A;  . CATARACT EXTRACTION W/PHACO Left 04/29/2020   Procedure: CATARACT EXTRACTION PHACO AND INTRAOCULAR LENS PLACEMENT (IOC) LEFT 17.47 01:28.8;  Surgeon: Birder Robson, MD;  Location: [place];  Service: Ophthalmology;  Laterality: Left;  . CATARACT EXTRACTION W/PHACO Right 05/20/2020   Procedure: CATARACT EXTRACTION PHACO AND INTRAOCULAR LENS PLACEMENT (IOC) RIGHT 14.89 01:13.8;  Surgeon: Birder Robson, MD;  Location: [place];  Service: Ophthalmology;  Laterality: Right;  . COLOSTOMY N/A 01/05/2018   Procedure: COLOSTOMY;  Surgeon: Benjamine Sprague, DO;  Location: [facility];  Service: General;  Laterality: N/A;  . COLOSTOMY TAKEDOWN N/A 11/28/2018   Procedure: COLOSTOMY TAKEDOWN, DIABETIC;  Surgeon: Benjamine Sprague, DO;  Location: [facility];  Service: General;  Laterality: N/A;  . LAPAROTOMY N/A 01/05/2018   Procedure: EXPLORATORY LAPAROTOMY/ POSSIBLE BOWEL OBSTRUCTION;  Surgeon: Benjamine Sprague, DO;  Location: [facility];  Service: General;  Laterality: N/A;  . LYSIS OF ADHESION N/A 01/05/2018    Procedure: LYSIS OF ADHESION;  Surgeon: Benjamine Sprague, DO;  Location: [facility];  Service: General;  Laterality: N/A;   HPI:  Pt  is a 80 y.o. Caucasian female with medical history significant for Dementia/Memory deficits, asthma, atrial fibrillation, CHF, GERD, Large Hiatal Hernia, and hypertension, who presented to the emergency room with acute onset of syncope.  The patient and her husband help her from the toilet after urination back to her wheelchair when she collapsed and remained unconscious for 2 to 3 minutes.  There were no falls or injuries. Spouse states patient vomited x1 after syncopal episode.  No head injuries.  When the patient came to the ER she was found in atrial fibrillation with RVR.  She stated that she felt funny and lightheaded and was diaphoretic and had shortness of breath during this episode.  No recent fever or chills.  Currently, no nausea or vomiting or abdominal pain.  She denied any chest pain or even palpitations.  She was recently at the hospital 09/2020 for Electrical Cardioversion  Indications:  Paroxysmal non valvular atrial fibrillation.  CT of Chest revealed: "Radiation fibrosis changes RIGHT upper lobe.     Stable BILATERAL pulmonary nodules.     New patchy area of consolidation in RIGHT lower lobe laterally  consistent with pneumonia.     Large hiatal hernia.".   Assessment / Plan / Recommendation Clinical Impression  Pt appears to present w/ adequate oropharyngeal phase swallowing function w/ No overt oropharyngeal phase dysphagia appreciated during trials; No neuromuscular swallowing deficits appreciated. Pt is at reduced risk for aspiration from an oropharyngeal phase standpoint following general  aspiration precautions. Pt has a suspected baseline of REFLUX on PPI per chart, and Esophageal Dysmotiltiy w/ a Large Hiatal Hernia per CT of Chest. ANY Dysmotility or Regurgitation of Reflux material can increase risk for aspiration of the Reflux material during Retrograde  flow thus impact Pulmonary status. Pt described ongoing issues of "feeling full in my chest" during meals pointing to mid sternum area.  Pt has Baseline Dementia/Cognitive decline per chart/MD which can impact articulation/description of issues felt and follow through w/ precautions. After helping pt position more upright in bed w/ pillows low behind back for support, pt consumed trials of thin liquids Via Cup, tsps of puree and softened, moist solids w/ no immediate, overt clinical s/s of aspiration noted; clear vocal quality b/t trials, no decline in pulmonary status, no multiple swallows noted post initial pharyngeal swallow. Oral phase appeared [facility] for bolus management, mastication of soft foods, and timely A-P transfer/clearing of material. Min increased Time noted for full oral phase management/clearing of soft solid trials. OM exam was [facility] for lingual/labial ROM, movements. No unilateral weakness. Speech clear.   Of note, pt exhibited Belching/Hiccups ~2/3 way through the meal. Instructed pt to slow down; Rest Breaks given to allow time for clearing the Esophagus. Educated on taking Rest Breaks during meals for conservation of energy and allowing Esophageal clearing. Pt acknowledged feeling "full" pointing at mid sternum area. Suspect related to Esophageal phase Dysmotility.  Recommend continue Regluar diet for ease of choice of manageable foods as tolerates(w/ Less meats/breads in diet, moistened foods) w/ thin liquids via Cup - No straw use d/t air swallowing; general aspiration precautions. Rest Breaks during meals/oral intake to allow for Esophageal clearing. REFLUX precautions strongly recommended to lessen chance for Regurgitation. Monitoring at meals for follow through w/ precautions and need for Rest Breaks. Recommend pt f/u w/ GI for management of any Reflux and tx as indicated; education on Esophageal Dysmotility and on Large Hiatal Hernia impact. Disucssion and handouts given on Reflux,  Esophaegal dysmotility, food consistencies. MD to reconsult ST services if any new needs while admitted.  SLP Visit Diagnosis: Dysphagia, unspecified (R13.10) (Large Hiatal Hernia baseline)    Aspiration Risk   (reduced from oropharyngeal phase)    Diet Recommendation  Regular diet w/ more mech soft type foods - cooked, moist and Cut Small; Thin liquids VIA CUP(less air swallowed). General aspiration precautions; STRICT REFLUX PRECAUTIONS. Tray setup at meals.  Medication Administration: Whole meds with puree (for safer swallowing d/t Cognitive decline)    Other  Recommendations Recommended Consults: Consider GI evaluation;Consider esophageal assessment (Dietician f/u - all as needed) Oral Care Recommendations: Oral care BID;Oral care before and after PO;Staff/trained caregiver to provide oral care Other Recommendations:  (n/a)   Follow up Recommendations None      Frequency and Duration  (n/a)   (n/a)       Prognosis Prognosis for Safe Diet Advancement: Fair (-Good) Barriers to Reach Goals: Cognitive deficits;Time post onset;Severity of deficits (Large Hiatal Hernia baseline) Barriers/Prognosis Comment: Large Hiatal Hernia baseline      Swallow Study   General Date of Onset: 10/14/20 HPI: Pt  is a 80 y.o. Caucasian female with medical history significant for Dementia/Memory deficits, asthma, atrial fibrillation, CHF, GERD, Large Hiatal Hernia, and hypertension, who presented to the emergency room with acute onset of syncope.  The patient and her husband help her from the toilet after urination back to her wheelchair when she collapsed and remained unconscious for 2 to 3 minutes.  There  were no falls or injuries. Spouse states patient vomited x1 after syncopal episode.  No head injuries.  When the patient came to the ER she was found in atrial fibrillation with RVR.  She stated that she felt funny and lightheaded and was diaphoretic and had shortness of breath during this episode.  No  recent fever or chills.  Currently, no nausea or vomiting or abdominal pain.  She denied any chest pain or even palpitations.  She was recently at the hospital 09/2020 for Electrical Cardioversion  Indications:  Paroxysmal non valvular atrial fibrillation.  CT of Chest revealed: "Radiation fibrosis changes RIGHT upper lobe.     Stable BILATERAL pulmonary nodules.     New patchy area of consolidation in RIGHT lower lobe laterally  consistent with pneumonia.     Large hiatal hernia.". Type of Study: Bedside Swallow Evaluation Previous Swallow Assessment: [facility] in 10/2018 - no oropharyngeal phase dysphagia noted then Diet Prior to this Study: Regular;Thin liquids Temperature Spikes Noted: No (wbc 9.8) Respiratory Status: Room air History of Recent Intubation: No Behavior/Cognition: Alert;Cooperative;Pleasant mood;Confused;Distractible;Requires cueing Oral Cavity Assessment: Within Functional Limits Oral Care Completed by SLP: Yes Oral Cavity - Dentition: Adequate natural dentition Vision: Functional for self-feeding Self-Feeding Abilities: Able to feed self;Needs set up;Needs assist Patient Positioning: Upright in bed (needed positioning support for Upright sitting) Baseline Vocal Quality: Normal Volitional Cough: Strong Volitional Swallow: Able to elicit    Oral/Motor/Sensory Function Overall Oral Motor/Sensory Function: Within functional limits   Ice Chips Ice chips: Within functional limits Presentation: Spoon (fed; 2 trials)   Thin Liquid Thin Liquid: Within functional limits Presentation: Cup;Self Fed (~6 ozs+)    Nectar Thick Nectar Thick Liquid: Not tested   Honey Thick Honey Thick Liquid: Not tested   Puree Puree: Within functional limits Presentation: Self Fed;Spoon (7 trials)   Solid     Solid: Within functional limits (cut small) Presentation: Self Fed;Spoon (15+ bites)        Orinda Kenner, MS, CCC-SLP Speech Language Pathologist Rehab  Services [phone] Valeri Sula 10/17/2020,10:54 AM

## 2020-10-17 NOTE — Consult Note (Signed)
Pharmacy Antibiotic Note  Jody [name] is a 80 y.o. female admitted on 10/14/2020 with pneumonia.  Pharmacy has been consulted for Unasyn dosing.  Plan: Day 2 of abx. Scr trending up will adjust to Unasyn 3 g q12H. Monitor Scr. Recommend 5-7 days of therapy. Plan to switch to augmentin at discharge.   Height: 4\' 11"  (149.9 cm) Weight: 52.8 kg (116 lb 4.8 oz) IBW/kg (Calculated) : 43.2  Temp (24hrs), Avg:98.1 F (36.7 C), Min:97.6 F (36.4 C), Max:98.7 F (37.1 C)  Recent Labs  Lab 10/14/20 2234 10/15/20 0522 10/16/20 0359 10/17/20 0354  WBC 12.3* 9.8  --   --   CREATININE 1.47* 1.13* 0.98 1.10*    Estimated Creatinine Clearance: 29.3 mL/min (A) (by C-G formula based on SCr of 1.1 mg/dL (H)).    Allergies  Allergen Reactions  . Donepezil     Loss of appetite  . Poison Oak Extract Other (See Comments)    blistering  . Ampicillin Rash  . Penicillin V Potassium Rash    Has patient had a PCN reaction causing immediate rash, facial/tongue/throat swelling, SOB or lightheadedness with hypotension: No Has patient had a PCN reaction causing severe rash involving mucus membranes or skin necrosis: No Has patient had a PCN reaction that required hospitalization: No Has patient had a PCN reaction occurring within the last 10 years: No If all of the above answers are "NO", then may proceed with Cephalosporin use.    Microbiology results: None   Thank you for allowing pharmacy to be a part of this patient's care.  Oswald Hillock 10/17/2020 8:56 AM

## 2020-10-17 NOTE — Progress Notes (Signed)
Patient ID: Jody [name], female   DOB: 1940/12/24, 80 y.o.   MRN: [id number] [facility] PROGRESS NOTE  Jody [name] EYC:[id number] DOB: [birth year] DOA: 10/14/2020 PCP: Baxter Hire, MD  HPI/Subjective: Patient feeling a little tired this morning.  Not much cough.  Eating better than she did at home.  No shortness of breath.  Diagnosed with pneumonia on CT scan yesterday.  Heart rate as high as 128 on reevaluation.  Objective: Vitals:   10/17/20 0900 10/17/20 1118  BP:  101/76  Pulse: (!) 106 76  Resp: 20 18  Temp:  97.9 F (36.6 C)  SpO2:  97%    Intake/Output Summary (Last 24 hours) at 10/17/2020 1518 Last data filed at 10/17/2020 1330 Gross per 24 hour  Intake 921.91 ml  Output --  Net 921.91 ml   Filed Weights   10/14/20 2232 10/15/20 1156 10/17/20 0913  Weight: 57.6 kg 52.8 kg 53 kg    ROS: Review of Systems  Respiratory: Negative for shortness of breath.   Cardiovascular: Negative for chest pain.  Gastrointestinal: Negative for abdominal pain, nausea and vomiting.   Exam: Physical Exam HENT:     Head: Normocephalic.     Mouth/Throat:     Pharynx: No oropharyngeal exudate.  Eyes:     General: Lids are normal.     Conjunctiva/sclera: Conjunctivae normal.     Pupils: Pupils are equal, round, and reactive to light.  Cardiovascular:     Rate and Rhythm: Tachycardia present. Rhythm irregularly irregular.     Heart sounds: Normal heart sounds, S1 normal and S2 normal.  Pulmonary:     Breath sounds: Examination of the right-lower field reveals decreased breath sounds. Examination of the left-lower field reveals decreased breath sounds. Decreased breath sounds present. No wheezing, rhonchi or rales.  Abdominal:     Palpations: Abdomen is soft.     Tenderness: There is no abdominal tenderness.  Musculoskeletal:     Right ankle: Swelling present.     Left ankle: Swelling present.  Skin:    General: Skin is warm.     Findings: No rash.  Neurological:      Mental Status: She is alert.     Comments: Answers questions and follows some simple commands.  Able to straight leg raise.       Data Reviewed: Basic Metabolic Panel: Recent Labs  Lab 10/14/20 2234 10/15/20 0522 10/16/20 0359 10/17/20 0354  NA 127* 128* 130* 132*  K 2.5* 3.1* 3.7 3.7  CL 93* 96* 96* 98  CO2 20* 21* 23 24  GLUCOSE 112* 105* 92 97  BUN 16 15 13 22   CREATININE 1.47* 1.13* 0.98 1.10*  CALCIUM 8.0* 8.0* 8.4* 8.6*  MG  --  1.1* 2.3 1.8  PHOS  --   --   --  2.1*   CBC: Recent Labs  Lab 10/14/20 2234 10/15/20 0522  WBC 12.3* 9.8  HGB 15.0 13.2  HCT 42.3 37.1  MCV 80.4 79.8*  PLT 333 303   BNP (last 3 results) Recent Labs    08/29/20 1742 10/14/20 2234  BNP 1,896.8* 445.0*   CBG: Recent Labs  Lab 10/14/20 2244  GLUCAP 102*    Recent Results (from the past 240 hour(s))  Resp Panel by RT-PCR (Flu A&B, Covid) Nasopharyngeal Swab     Status: None   Collection Time: 10/14/20 11:35 PM   Specimen: Nasopharyngeal Swab; Nasopharyngeal(NP) swabs in vial transport medium  Result Value Ref Range Status   SARS  Coronavirus 2 by RT PCR NEGATIVE NEGATIVE Final    Comment: (NOTE) SARS-CoV-2 target nucleic acids are NOT DETECTED.  The SARS-CoV-2 RNA is generally detectable in upper respiratory specimens during the acute phase of infection. The lowest concentration of SARS-CoV-2 viral copies this assay can detect is 138 copies/mL. A negative result does not preclude SARS-Cov-2 infection and should not be used as the sole basis for treatment or other patient management decisions. A negative result may occur with  improper specimen collection/handling, submission of specimen other than nasopharyngeal swab, presence of viral mutation(s) within the areas targeted by this assay, and inadequate number of viral copies(<138 copies/mL). A negative result must be combined with clinical observations, patient history, and epidemiological information. The expected  result is Negative.  Fact Sheet for Patients:  EntrepreneurPulse.com.au  Fact Sheet for Healthcare Providers:  IncredibleEmployment.be  This test is no t yet approved or cleared by the [place] FDA and  has been authorized for detection and/or diagnosis of SARS-CoV-2 by FDA under an Emergency Use Authorization (EUA). This EUA will remain  in effect (meaning this test can be used) for the duration of the COVID-19 declaration under Section 564(b)(1) of the Act, 21 U.S.C.section 360bbb-3(b)(1), unless the authorization is terminated  or revoked sooner.       Influenza A by PCR NEGATIVE NEGATIVE Final   Influenza B by PCR NEGATIVE NEGATIVE Final    Comment: (NOTE) The Xpert Xpress SARS-CoV-2/FLU/RSV plus assay is intended as an aid in the diagnosis of influenza from Nasopharyngeal swab specimens and should not be used as a sole basis for treatment. Nasal washings and aspirates are unacceptable for Xpert Xpress SARS-CoV-2/FLU/RSV testing.  Fact Sheet for Patients: EntrepreneurPulse.com.au  Fact Sheet for Healthcare Providers: IncredibleEmployment.be  This test is not yet approved or cleared by the [place] FDA and has been authorized for detection and/or diagnosis of SARS-CoV-2 by FDA under an Emergency Use Authorization (EUA). This EUA will remain in effect (meaning this test can be used) for the duration of the COVID-19 declaration under Section 564(b)(1) of the Act, 21 U.S.C. section 360bbb-3(b)(1), unless the authorization is terminated or revoked.  Performed at [facility], [address] [place], [place] [address]   Resp Panel by RT-PCR (Flu A&B, Covid) Nasopharyngeal Swab     Status: None   Collection Time: 10/17/20  1:07 PM   Specimen: Nasopharyngeal Swab; Nasopharyngeal(NP) swabs in vial transport medium  Result Value Ref Range Status   SARS Coronavirus 2 by RT PCR NEGATIVE  NEGATIVE Final    Comment: (NOTE) SARS-CoV-2 target nucleic acids are NOT DETECTED.  The SARS-CoV-2 RNA is generally detectable in upper respiratory specimens during the acute phase of infection. The lowest concentration of SARS-CoV-2 viral copies this assay can detect is 138 copies/mL. A negative result does not preclude SARS-Cov-2 infection and should not be used as the sole basis for treatment or other patient management decisions. A negative result may occur with  improper specimen collection/handling, submission of specimen other than nasopharyngeal swab, presence of viral mutation(s) within the areas targeted by this assay, and inadequate number of viral copies(<138 copies/mL). A negative result must be combined with clinical observations, patient history, and epidemiological information. The expected result is Negative.  Fact Sheet for Patients:  EntrepreneurPulse.com.au  Fact Sheet for Healthcare Providers:  IncredibleEmployment.be  This test is no t yet approved or cleared by the [place] FDA and  has been authorized for detection and/or diagnosis of SARS-CoV-2 by FDA under an  Emergency Use Authorization (EUA). This EUA will remain  in effect (meaning this test can be used) for the duration of the COVID-19 declaration under Section 564(b)(1) of the Act, 21 U.S.C.section 360bbb-3(b)(1), unless the authorization is terminated  or revoked sooner.       Influenza A by PCR NEGATIVE NEGATIVE Final   Influenza B by PCR NEGATIVE NEGATIVE Final    Comment: (NOTE) The Xpert Xpress SARS-CoV-2/FLU/RSV plus assay is intended as an aid in the diagnosis of influenza from Nasopharyngeal swab specimens and should not be used as a sole basis for treatment. Nasal washings and aspirates are unacceptable for Xpert Xpress SARS-CoV-2/FLU/RSV testing.  Fact Sheet for Patients: EntrepreneurPulse.com.au  Fact Sheet for Healthcare  Providers: IncredibleEmployment.be  This test is not yet approved or cleared by the [place] FDA and has been authorized for detection and/or diagnosis of SARS-CoV-2 by FDA under an Emergency Use Authorization (EUA). This EUA will remain in effect (meaning this test can be used) for the duration of the COVID-19 declaration under Section 564(b)(1) of the Act, 21 U.S.C. section 360bbb-3(b)(1), unless the authorization is terminated or revoked.  Performed at [facility], [address] [place], [place] [address]      Studies: CT CHEST WO CONTRAST  Result Date: 10/16/2020 CLINICAL DATA:  Lung nodule, follow-up; history of RIGHT lung cancer post radiation therapy EXAM: CT CHEST WITHOUT CONTRAST TECHNIQUE: Multidetector CT imaging of the chest was performed following the standard protocol without IV contrast. Sagittal and coronal MPR images reconstructed from axial data set. COMPARISON:  05/08/2019, 11/03/2018 FINDINGS: Cardiovascular: Atherosclerotic calcifications aorta, coronary arteries, proximal great vessels. Aorta normal caliber. Heart unremarkable. No pericardial effusion. Mediastinum/Nodes: Large hiatal hernia. Esophagus unremarkable. Tiny BILATERAL thyroid nodules up to 8 mm diameter; Not clinically significant; no follow-up imaging recommended (ref: J Am Coll Radiol. 2015 Feb;12(2): 143-50).No definite thoracic adenopathy. Slightly prominent RIGHT pulmonary hilum but not grossly changed. Lungs/Pleura: Radiation fibrosis changes again identified RIGHT upper lobe with associated parenchymal calcifications. Changes extend to the RIGHT hilum. 4 mm RIGHT upper lobe nodule image 40 unchanged. 6 x 4 mm RIGHT upper lobe nodule image 39, measured 4 x 4 mm on the previous exam but 6 x 4 mm on the earlier study of 11/03/2018. RIGHT upper lobe nodule 7 x 4 mm image 46 unchanged. Additional scattered nodular foci and calcified granulomata in both lungs including a 6 mm  LEFT upper lobe nodule image 39 unchanged. New patchy area of consolidation in RIGHT lower lobe laterally consistent with pneumonia. No pleural effusion or pneumothorax. No definite new or additional enlarging nodules seen. Upper Abdomen: 7.0 cm diameter partially calcified splenic mass with globular enlargement of spleen unchanged. Post cholecystectomy. Atherosclerotic calcifications aorta and SMA. Musculoskeletal: Osseous demineralization. IMPRESSION: Radiation fibrosis changes RIGHT upper lobe. Stable BILATERAL pulmonary nodules. New patchy area of consolidation in RIGHT lower lobe laterally consistent with pneumonia. Large hiatal hernia. 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 Aortic Atherosclerosis (ICD10-I70.0). Electronically Signed   By: Lavonia Dana M.D.   On: 10/16/2020 13:03    Scheduled Meds: . ALPRAZolam  0.5 mg Oral QHS  . amiodarone  400 mg Oral BID  . apixaban  2.5 mg Oral BID  . calcium-vitamin D  1 tablet Oral Daily  . feeding supplement  237 mL Oral BID BM  . FLUoxetine  40 mg Oral Daily  . magnesium oxide  400 mg Oral Daily  . megestrol  625 mg Oral Daily  . memantine  5  mg Oral BID  . metoprolol succinate  50 mg Oral BID  . multivitamin with minerals  1 tablet Oral Daily  . pantoprazole  40 mg Oral Daily  . potassium & sodium phosphates  1 packet Oral BID  . traZODone  50 mg Oral QHS   Continuous Infusions: . sodium chloride 5 mL/hr at 10/17/20 0548  . ampicillin-sulbactam (UNASYN) IV      Assessment/Plan:  1. atrial fibrillation with rapid ventricular response, paroxysmal in nature.  Patient on amiodarone and metoprolol.  Blood pressure on the lower side.  Heart rate as high as 128.  We will increase amiodarone dose for this evening and hopefully go back to 200 mg twice a day for tomorrow.  Continue Eliquis for anticoagulation. 2. Pneumonia right lower lobe and I suspect aspiration.  On Unasyn and will switch over to Augmentin upon disposition. 3. Acute  kidney injury on chronic kidney disease stage II with hyponatremia.  Received IV fluids initially and creatinine improved from 1.47 down to 0.98 after stopping fluids creatinine up at 1.11.  Sodium up to 132. 4. Hypomagnesemia replace with oral magnesium 5. Hypokalemia this has been replaced 6. Hypophosphatemia we will give K-Phos for 3 days 7. Weakness.  Physical therapy recommending rehab 8. Dementia.  Continue Namenda 9. Failure to thrive.  Continue Megace 10. Syncope likely secondary to rapid heart rate and electrolyte abnormalities 11. History of lung cancer.  Radiologist stated that lung nodules are stable. 12. Hiatal hernia start PPI        Code Status:     Code Status Orders  (From admission, onward)         Start     Ordered   10/15/20 0106  Full code  Continuous        10/15/20 0109        Code Status History    Date Active Date Inactive Code Status Order ID Comments User Context   08/29/2020 1942 09/01/2020 2010 Full Code [id number]  Christel Mormon, MD ED   11/28/2018 1654 12/03/2018 1842 Full Code [id number]  Benjamine Sprague, DO Inpatient   12/27/2017 1601 01/22/2018 1704 DNR [id number]  Flora Lipps, MD Inpatient   12/27/2017 1204 12/27/2017 1601 Full Code [id number]  Benjamine Sprague, DO ED   12/27/2017 1204 12/27/2017 1204 Full Code [id number]  Benjamine Sprague, DO ED   Advance Care Planning Activity     Family Communication: Spoke with husband at the bedside Disposition Plan: Status is: Inpatient  Dispo: The patient is from: Home              Anticipated d/c is to: Rehab, likely tomorrow              Patient currently at heart rate jump up to 128 today   Difficult to place patient.  No.  Consultants:  Cardiology  Time spent: 32 minutes  [place]

## 2020-10-18 LAB — CBC
HCT: 36.7 % (ref 36.0–46.0)
Hemoglobin: 12.8 g/dL (ref 12.0–15.0)
MCH: 28.8 pg (ref 26.0–34.0)
MCHC: 34.9 g/dL (ref 30.0–36.0)
MCV: 82.7 fL (ref 80.0–100.0)
Platelets: 285 10*3/uL (ref 150–400)
RBC: 4.44 MIL/uL (ref 3.87–5.11)
RDW: 16.3 % — ABNORMAL HIGH (ref 11.5–15.5)
WBC: 7.6 10*3/uL (ref 4.0–10.5)
nRBC: 0 % (ref 0.0–0.2)

## 2020-10-18 MED ORDER — AMOXICILLIN-POT CLAVULANATE 875-125 MG PO TABS: 1.0000 | ORAL_TABLET | Freq: Two times a day (BID) | ORAL | 0 refills | Status: AC

## 2020-10-18 MED ORDER — METOPROLOL TARTRATE 5 MG/5ML IV SOLN
2.5000 mg | Freq: Once | INTRAVENOUS | Status: AC
  Administered 2020-10-18: 2.5 mg via INTRAVENOUS
  Filled 2020-10-18: qty 5

## 2020-10-18 MED ORDER — DIGOXIN 125 MCG PO TABS
0.1250 mg | ORAL_TABLET | Freq: Every day | ORAL | Status: DC
  Administered 2020-10-18: 0.125 mg via ORAL
  Filled 2020-10-18: qty 1

## 2020-10-18 MED ORDER — AMIODARONE HCL 200 MG PO TABS: ORAL_TABLET | ORAL | 0 refills | Status: DC

## 2020-10-18 MED ORDER — DIGOXIN 125 MCG PO TABS: 0.1250 mg | ORAL_TABLET | Freq: Every day | ORAL | 0 refills | Status: AC

## 2020-10-18 NOTE — TOC Transition Note (Signed)
Transition of Care [facility]) - CM/SW Discharge Note   Patient Details  Name: Jody [name] MRN: [id number] Date of Birth: [birth year]  [facility] CM/SW Contact:  Shelbie Hutching, RN Phone Number: 10/18/2020, 9:36 AM   Clinical Narrative:    Patient medically cleared for discharge to [facility] today.  Patient will be going to room 13A.  Bedside RN to call report to [phone].  RNCM will arrange EMS transport.  EMS called and patient is first on the list for pick up.    Final next level of care: Skilled Nursing Facility Barriers to Discharge: Barriers Resolved   Patient Goals and CMS Choice Patient states their goals for this hospitalization and ongoing recovery are:: for pt to get better CMS Medicare.gov Compare Post Acute Care list provided to:: Patient Represenative (must comment) Choice offered to / list presented to : Spouse  Discharge Placement              Patient chooses bed at: [facility] Patient to be transferred to facility by: [facility] Name of family member notified: [name] ( husband) Patient and family notified of of transfer: 10/18/20  Discharge Plan and Services In-house Referral: Clinical Social Work   Post Acute Care Choice: [place]          DME Arranged: N/A DME Agency: NA       HH Arranged: NA          Social Determinants of Health (SDOH) Interventions     Readmission Risk Interventions Readmission Risk Prevention Plan 10/16/2020  Transportation Screening Complete  PCP or Specialist Appt within 3-5 Days Complete  HRI or Home Care Consult Complete  Social Work Consult for [place] Planning/Counseling Complete  Palliative Care Screening Not Applicable  Medication Review Press photographer) Complete  Some recent data might be hidden

## 2020-10-18 NOTE — Discharge Summary (Signed)
[facility] NAME: Jody [name]    MR#:  [id number]  DATE OF BIRTH:  November 07, 1940  DATE OF ADMISSION:  10/14/2020 ADMITTING PHYSICIAN: Loletha Grayer, MD  DATE OF DISCHARGE: 10/18/2020  PRIMARY CARE PHYSICIAN: Baxter Hire, MD    ADMISSION DIAGNOSIS:  Hypokalemia [E87.6] Atrial fibrillation with rapid ventricular response ([facility]) [I48.91] Atrial fibrillation with RVR ([place]) [I48.91] Syncope, unspecified syncope type [R55] Hypomagnesemia [E83.42]  DISCHARGE DIAGNOSIS:  Active Problems:   Atrial fibrillation with RVR (HCC)   Dementia without behavioral disturbance (HCC)   Acute kidney injury superimposed on CKD (HCC)   Hyponatremia   Hypomagnesemia   Hypokalemia   Syncope   Weakness   Lobar pneumonia (HCC)   AF (paroxysmal atrial fibrillation) ([place])   Hypophosphatemia   SECONDARY DIAGNOSIS:   Past Medical History:  Diagnosis Date  . Asthma   . Atrial fibrillation ([place])   . CHF (congestive heart failure) ([facility])   . 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 Dysrhythmia   . GERD (gastroesophageal reflux disease)   . Hypertension   . Memory difficulties   . Primary adenocarcinoma of right lung ([place]) 02/2005   Rad tx's.   . Uses walker     HOSPITAL COURSE:   1.  Atrial fibrillation with rapid ventricular response.  Seen by cardiology this morning and is okay with discharge out to rehab.  Cardiology added back digoxin low-dose.  I increased amiodarone to 400 mg twice a day for 3 days and then decrease down to 200 mg twice a day.  Continue metoprolol.  Metoprolol needed for heart rate control.  Blood pressure does remain on the lower side. 2.  Pneumonia right lower lobe likely from aspiration.  Unasyn will be switched over to Augmentin for 3 more days upon discharge. 3.  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  Initially the patient received fluids and creatinine was  1.47 on presentation went as low as 0.98.  Creatinine 1.11 as of yesterday.  Sodium up to 132. 4.  Hypomagnesemia.  Patient received IV magnesium and oral magnesium will be prescribed upon discharge 5.  Hypokalemia this has been replaced 6.  Hypophosphatemia we will give K-Phos for 3 days 7.  Weakness.  Physical therapy recommends rehab 8.  Dementia continue Namenda 9.  Failure to thrive.  Continue Megace.  10.  Syncope.  Likely secondary to rapid heart rate and electrolyte abnormalities. 11.  History of lung cancer.  Radiologist stated that the lung nodules are stable from previous exam 12.  Hiatal hernia start PPI 13.  chronic diastolic congestive heart failure.  Hesitant on standing dose Lasix because of the patient's poor appetite and electrolyte abnormalities.  Use Lasix as needed for weight gain of 3 pounds in 1 day or 5 pounds in a week or shortness of breath.  Recommend checking a BMP, magnesium, phosphorus and digoxin level in 1 week.  DISCHARGE CONDITIONS:   Fair  CONSULTS OBTAINED:  Treatment Team:  Isaias Cowman, MD  DRUG ALLERGIES:   Allergies  Allergen Reactions  . Donepezil     Loss of appetite  . Poison Oak Extract Other (See Comments)    blistering  . Ampicillin Rash  . Penicillin V Potassium Rash    Has patient had a PCN reaction causing immediate rash, facial/tongue/throat swelling, SOB or lightheadedness with hypotension: No Has patient had a PCN reaction causing severe rash involving  mucus membranes or skin necrosis: No Has patient had a PCN reaction that required hospitalization: No Has patient had a PCN reaction occurring within the last 10 years: No If all of the above answers are "NO", then may proceed with Cephalosporin use.     DISCHARGE MEDICATIONS:   Allergies as of 10/18/2020      Reactions   Donepezil    Loss of appetite   Poison Oak Extract Other (See Comments)   blistering   Ampicillin Rash   Penicillin V Potassium Rash   Has  patient had a PCN reaction causing immediate rash, facial/tongue/throat swelling, SOB or lightheadedness with hypotension: No Has patient had a PCN reaction causing severe rash involving mucus membranes or skin necrosis: No Has patient had a PCN reaction that required hospitalization: No Has patient had a PCN reaction occurring within the last 10 years: No If all of the above answers are "NO", then may proceed with Cephalosporin use.      Medication List    STOP taking these medications   lovastatin 20 MG tablet Commonly known as: MEVACOR   megestrol 625 MG/5ML suspension Commonly known as: MEGACE ES   montelukast 10 MG tablet Commonly known as: SINGULAIR   omeprazole 20 MG capsule Commonly known as: PRILOSEC     TAKE these medications   acetaminophen 325 MG tablet Commonly known as: TYLENOL Take 2 tablets (650 mg total) by mouth every 6 (six) hours as needed (fever > 101). What changed:  how much to take reasons to take this   albuterol 108 (90 Base) MCG/ACT inhaler Commonly known as: VENTOLIN HFA Inhale 2 puffs into the lungs every 6 (six) hours as needed for wheezing or shortness of breath.   albuterol (2.5 MG/3ML) 0.083% nebulizer solution Commonly known as: PROVENTIL Take 2.5 mg by nebulization every 6 (six) hours as needed for wheezing or shortness of breath.   alendronate 70 MG tablet Commonly known as: FOSAMAX Take 70 mg by mouth every Saturday.   ALPRAZolam 0.25 MG tablet Commonly known as: XANAX Take 1 tablet (0.25 mg total) by mouth 2 (two) times daily as needed for anxiety or sleep. What changed:  medication strength how much to take   amiodarone 200 MG tablet Commonly known as: Pacerone Two tabs po twice a day for three days then one tab po twice a day afterwards What changed:  how much to take how to take this when to take this additional instructions   amoxicillin-clavulanate 875-125 MG tablet Commonly known as: Augmentin Take 1 tablet by  mouth 2 (two) times daily for 3 days.   apixaban 2.5 MG Tabs tablet Commonly known as: ELIQUIS Take 1 tablet (2.5 mg total) by mouth 2 (two) times daily.   Calcium Carbonate-Vitamin D 600-200 MG-UNIT Tabs Take 1 tablet by mouth daily.   digoxin 0.125 MG tablet Commonly known as: LANOXIN Take 1 tablet (0.125 mg total) by mouth daily.   feeding supplement Liqd Take 237 mLs by mouth 2 (two) times daily between meals.   FLUoxetine 40 MG capsule Commonly known as: PROZAC Take 40 mg by mouth daily.   furosemide 20 MG tablet Commonly known as: LASIX Take 1 tablet (20 mg total) by mouth daily as needed (shortness of breath, weight gain of 3 lbs one day or 5 lbs one week). What changed:  when to take this reasons to take this   magnesium oxide 400 (240 Mg) MG tablet Commonly known as: MAG-OX Take 1 tablet (400 mg total) by  mouth daily.   megestrol 400 MG/10ML suspension Commonly known as: MEGACE Take 15.6 mLs (625 mg total) by mouth daily.   memantine 5 MG tablet Commonly known as: NAMENDA Take 5 mg by mouth daily.   metoprolol succinate 50 MG 24 hr tablet Commonly known as: Toprol XL Take 1 tablet (50 mg total) by mouth in the morning and at bedtime. Hold if BP is <120 and HR<70   Multi-Vitamins Tabs Take 1 tablet by mouth daily.   pantoprazole 40 MG tablet Commonly known as: PROTONIX Take 1 tablet (40 mg total) by mouth daily.   potassium & sodium phosphates 280-160-250 MG Pack Commonly known as: PHOS-NAK Take 1 packet by mouth daily for 3 days.   traMADol 50 MG tablet Commonly known as: ULTRAM Take 1 tablet (50 mg total) by mouth every 6 (six) hours as needed for severe pain. What changed: reasons to take this   traZODone 50 MG tablet Commonly known as: DESYREL Take 1 tablet (50 mg total) by mouth at bedtime.        DISCHARGE INSTRUCTIONS:  Follow-up team at rehab 1 day Follow-up cardiology 2 week  If you experience worsening of your admission  symptoms, develop shortness of breath, life threatening emergency, suicidal or homicidal thoughts you must seek medical attention immediately by calling 911 or calling your MD immediately  if symptoms less severe.  You Must read complete instructions/literature along with all the possible adverse reactions/side effects for all the Medicines you take and that have been prescribed to you. Take any new Medicines after you have completely understood and accept all the possible adverse reactions/side effects.   Please note  You were cared for by a hospitalist during your hospital stay. If you have any questions about your discharge medications or the care you received while you were in the hospital after you are discharged, you can call the unit and asked to speak with the hospitalist on call if the hospitalist that took care of you is not available. Once you are discharged, your primary care physician will handle any further medical issues. Please note that NO REFILLS for any discharge medications will be authorized once you are discharged, as it is imperative that you return to your primary care physician (or establish a relationship with a primary care physician if you do not have one) for your aftercare needs so that they can reassess your need for medications and monitor your lab values.    Today   CHIEF COMPLAINT:   Chief Complaint  Patient presents with  . Loss of Consciousness  . Abnormal ECG    HISTORY OF PRESENT ILLNESS:  Jody [name]  is a 80 y.o. female came in with syncope and found to have rapid A. fib and electrolyte abnormalities   VITAL SIGNS:  Blood pressure 95/62, pulse ranging between 105 and 125, pulse ox 98% on room air, respirations 18 temperature 98  I/O:    Intake/Output Summary (Last 24 hours) at 10/18/2020 0855 Last data filed at 10/18/2020 6283 Gross per 24 hour  Intake 880 ml  Output 1200 ml  Net -320 ml    PHYSICAL EXAMINATION:  GENERAL:  80 y.o.-year-old  patient lying in the bed with no acute distress.  EYES: Pupils equal, round, reactive to light and accommodation. No scleral icterus. HEENT: Head atraumatic, normocephalic. Oropharynx and nasopharynx clear.  LUNGS: Normal breath sounds bilaterally, no wheezing, rales,rhonchi or crepitation. No use of accessory muscles of respiration.  CARDIOVASCULAR: S1, S2 normal. No murmurs,  rubs, or gallops.  ABDOMEN: Soft, non-tender, non-distended.  EXTREMITIES: Trace pedal edema.  NEUROLOGIC: Cranial nerves II through XII are intact. Sensation intact. Gait not checked.  PSYCHIATRIC: The patient is alert and answers simple yes/no questions.  SKIN: No obvious rash, lesion, or ulcer.   DATA REVIEW:   CBC Recent Labs  Lab 10/18/20 0431  WBC 7.6  HGB 12.8  HCT 36.7  PLT 285    Chemistries  Recent Labs  Lab 10/17/20 0354  NA 132*  K 3.7  CL 98  CO2 24  GLUCOSE 97  BUN 22  CREATININE 1.10*  CALCIUM 8.6*  MG 1.8    Microbiology Results  Results for orders placed or performed during the hospital encounter of 10/14/20  Resp Panel by RT-PCR (Flu A&B, Covid) Nasopharyngeal Swab     Status: None   Collection Time: 10/14/20 11:35 PM   Specimen: Nasopharyngeal Swab; Nasopharyngeal(NP) swabs in vial transport medium  Result Value Ref Range Status   SARS Coronavirus 2 by RT PCR NEGATIVE NEGATIVE Final    Comment: (NOTE) SARS-CoV-2 target nucleic acids are NOT DETECTED.  The SARS-CoV-2 RNA is generally detectable in upper respiratory specimens during the acute phase of infection. The lowest concentration of SARS-CoV-2 viral copies this assay can detect is 138 copies/mL. A negative result does not preclude SARS-Cov-2 infection and should not be used as the sole basis for treatment or other patient management decisions. A negative result may occur with  improper specimen collection/handling, submission of specimen other than nasopharyngeal swab, presence of viral mutation(s) within the areas  targeted by this assay, and inadequate number of viral copies(<138 copies/mL). A negative result must be combined with clinical observations, patient history, and epidemiological information. The expected result is Negative.  Fact Sheet for Patients:  EntrepreneurPulse.com.au  Fact Sheet for Healthcare Providers:  IncredibleEmployment.be  This test is no t yet approved or cleared by the [place] FDA and  has been authorized for detection and/or diagnosis of SARS-CoV-2 by FDA under an Emergency Use Authorization (EUA). This EUA will remain  in effect (meaning this test can be used) for the duration of the COVID-19 declaration under Section 564(b)(1) of the Act, 21 U.S.C.section 360bbb-3(b)(1), unless the authorization is terminated  or revoked sooner.       Influenza A by PCR NEGATIVE NEGATIVE Final   Influenza B by PCR NEGATIVE NEGATIVE Final    Comment: (NOTE) The Xpert Xpress SARS-CoV-2/FLU/RSV plus assay is intended as an aid in the diagnosis of influenza from Nasopharyngeal swab specimens and should not be used as a sole basis for treatment. Nasal washings and aspirates are unacceptable for Xpert Xpress SARS-CoV-2/FLU/RSV testing.  Fact Sheet for Patients: EntrepreneurPulse.com.au  Fact Sheet for Healthcare Providers: IncredibleEmployment.be  This test is not yet approved or cleared by the [place] FDA and has been authorized for detection and/or diagnosis of SARS-CoV-2 by FDA under an Emergency Use Authorization (EUA). This EUA will remain in effect (meaning this test can be used) for the duration of the COVID-19 declaration under Section 564(b)(1) of the Act, 21 U.S.C. section 360bbb-3(b)(1), unless the authorization is terminated or revoked.  Performed at [facility], [address] [place], [place] [address]   Resp Panel by RT-PCR (Flu A&B, Covid) Nasopharyngeal Swab      Status: None   Collection Time: 10/17/20  1:07 PM   Specimen: Nasopharyngeal Swab; Nasopharyngeal(NP) swabs in vial transport medium  Result Value Ref Range Status   SARS Coronavirus 2 by RT  PCR NEGATIVE NEGATIVE Final    Comment: (NOTE) SARS-CoV-2 target nucleic acids are NOT DETECTED.  The SARS-CoV-2 RNA is generally detectable in upper respiratory specimens during the acute phase of infection. The lowest concentration of SARS-CoV-2 viral copies this assay can detect is 138 copies/mL. A negative result does not preclude SARS-Cov-2 infection and should not be used as the sole basis for treatment or other patient management decisions. A negative result may occur with  improper specimen collection/handling, submission of specimen other than nasopharyngeal swab, presence of viral mutation(s) within the areas targeted by this assay, and inadequate number of viral copies(<138 copies/mL). A negative result must be combined with clinical observations, patient history, and epidemiological information. The expected result is Negative.  Fact Sheet for Patients:  EntrepreneurPulse.com.au  Fact Sheet for Healthcare Providers:  IncredibleEmployment.be  This test is no t yet approved or cleared by the [place] FDA and  has been authorized for detection and/or diagnosis of SARS-CoV-2 by FDA under an Emergency Use Authorization (EUA). This EUA will remain  in effect (meaning this test can be used) for the duration of the COVID-19 declaration under Section 564(b)(1) of the Act, 21 U.S.C.section 360bbb-3(b)(1), unless the authorization is terminated  or revoked sooner.       Influenza A by PCR NEGATIVE NEGATIVE Final   Influenza B by PCR NEGATIVE NEGATIVE Final    Comment: (NOTE) The Xpert Xpress SARS-CoV-2/FLU/RSV plus assay is intended as an aid in the diagnosis of influenza from Nasopharyngeal swab specimens and should not be used as a sole basis  for treatment. Nasal washings and aspirates are unacceptable for Xpert Xpress SARS-CoV-2/FLU/RSV testing.  Fact Sheet for Patients: EntrepreneurPulse.com.au  Fact Sheet for Healthcare Providers: IncredibleEmployment.be  This test is not yet approved or cleared by the [place] FDA and has been authorized for detection and/or diagnosis of SARS-CoV-2 by FDA under an Emergency Use Authorization (EUA). This EUA will remain in effect (meaning this test can be used) for the duration of the COVID-19 declaration under Section 564(b)(1) of the Act, 21 U.S.C. section 360bbb-3(b)(1), unless the authorization is terminated or revoked.  Performed at [facility], [address] [place], [place] [address]     RADIOLOGY:  CT CHEST WO CONTRAST  Result Date: 10/16/2020 CLINICAL DATA:  Lung nodule, follow-up; history of RIGHT lung cancer post radiation therapy EXAM: CT CHEST WITHOUT CONTRAST TECHNIQUE: Multidetector CT imaging of the chest was performed following the standard protocol without IV contrast. Sagittal and coronal MPR images reconstructed from axial data set. COMPARISON:  05/08/2019, 11/03/2018 FINDINGS: Cardiovascular: Atherosclerotic calcifications aorta, coronary arteries, proximal great vessels. Aorta normal caliber. Heart unremarkable. No pericardial effusion. Mediastinum/Nodes: Large hiatal hernia. Esophagus unremarkable. Tiny BILATERAL thyroid nodules up to 8 mm diameter; Not clinically significant; no follow-up imaging recommended (ref: J Am Coll Radiol. 2015 Feb;12(2): 143-50).No definite thoracic adenopathy. Slightly prominent RIGHT pulmonary hilum but not grossly changed. Lungs/Pleura: Radiation fibrosis changes again identified RIGHT upper lobe with associated parenchymal calcifications. Changes extend to the RIGHT hilum. 4 mm RIGHT upper lobe nodule image 40 unchanged. 6 x 4 mm RIGHT upper lobe nodule image 39, measured 4 x 4 mm on  the previous exam but 6 x 4 mm on the earlier study of 11/03/2018. RIGHT upper lobe nodule 7 x 4 mm image 46 unchanged. Additional scattered nodular foci and calcified granulomata in both lungs including a 6 mm LEFT upper lobe nodule image 39 unchanged. New patchy area of consolidation in RIGHT lower lobe laterally  consistent with pneumonia. No pleural effusion or pneumothorax. No definite new or additional enlarging nodules seen. Upper Abdomen: 7.0 cm diameter partially calcified splenic mass with globular enlargement of spleen unchanged. Post cholecystectomy. Atherosclerotic calcifications aorta and SMA. Musculoskeletal: Osseous demineralization. IMPRESSION: Radiation fibrosis changes RIGHT upper lobe. Stable BILATERAL pulmonary nodules. New patchy area of consolidation in RIGHT lower lobe laterally consistent with pneumonia. Large hiatal hernia. 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 Aortic Atherosclerosis (ICD10-I70.0). Electronically Signed   By: Lavonia Dana M.D.   On: 10/16/2020 13:03    Management plans discussed with the patient, family and they are in agreement.  CODE STATUS:     Code Status Orders  (From admission, onward)         Start     Ordered   10/15/20 0106  Full code  Continuous        10/15/20 0109        Code Status History    Date Active Date Inactive Code Status Order ID Comments User Context   08/29/2020 1942 09/01/2020 2010 Full Code [id number]  Christel Mormon, MD ED   11/28/2018 1654 12/03/2018 1842 Full Code [id number]  Benjamine Sprague, DO Inpatient   12/27/2017 1601 01/22/2018 1704 DNR [id number]  Flora Lipps, MD Inpatient   12/27/2017 1204 12/27/2017 1601 Full Code [id number]  Benjamine Sprague, DO ED   12/27/2017 1204 12/27/2017 1204 Full Code [id number]  Benjamine Sprague, DO ED   Advance Care Planning Activity      TOTAL TIME TAKING CARE OF THIS PATIENT: 31 minutes.    Loletha Grayer M.D on 10/18/2020 at 8:55 AM  Between 7am to 6pm - Pager - [phone]  After  6pm go to [email or web address] - password [email or web address] [place]  [facility]  CC: Primary care physician; Baxter Hire, MD

## 2020-10-18 NOTE — Plan of Care (Signed)
  Problem: Education: Goal: Knowledge of General Education information will improve Description: Including pain rating scale, medication(s)/side effects and non-pharmacologic comfort measures Outcome: Adequate for Discharge   Problem: Health Behavior/Discharge Planning: Goal: Ability to manage health-related needs will improve Outcome: Adequate for Discharge   Problem: Clinical Measurements: Goal: Ability to maintain clinical measurements within normal limits will improve Outcome: Adequate for Discharge Goal: Will remain free from infection 10/18/2020 1029 by Rogers Seeds, RN Outcome: Adequate for Discharge 10/18/2020 1028 by Rogers Seeds, RN Outcome: Progressing Goal: Diagnostic test results will improve Outcome: Adequate for Discharge Goal: Respiratory complications will improve 10/18/2020 1029 by Rogers Seeds, RN Outcome: Adequate for Discharge 10/18/2020 1028 by Rogers Seeds, RN Outcome: Progressing Goal: Cardiovascular complication will be avoided Outcome: Adequate for Discharge   Problem: Activity: Goal: Risk for activity intolerance will decrease Outcome: Adequate for Discharge   Problem: Nutrition: Goal: Adequate nutrition will be maintained Outcome: Adequate for Discharge   Problem: Coping: Goal: Level of anxiety will decrease 10/18/2020 1029 by Rogers Seeds, RN Outcome: Adequate for Discharge 10/18/2020 1028 by Rogers Seeds, RN Outcome: Progressing   Problem: Elimination: Goal: Will not experience complications related to bowel motility Outcome: Adequate for Discharge Goal: Will not experience complications related to urinary retention Outcome: Adequate for Discharge   Problem: Pain Managment: Goal: General experience of comfort will improve Outcome: Adequate for Discharge   Problem: Safety: Goal: Ability to remain free from injury will improve Outcome: Adequate for Discharge   Problem: Skin Integrity: Goal: Risk for impaired skin integrity will  decrease Outcome: Adequate for Discharge

## 2020-10-18 NOTE — Progress Notes (Signed)
[facility]  SUBJECTIVE: Patient overall feels fine today.  No evidence of significant symptoms although has dementia and does not discuss too much.  Heart rate control is variable due to the fact that the nurses have held her beta-blocker due to lower blood pressure.  There has been no apparent evidence of significant side effects.  Therefore this morning should be able to reinstate.  With heart rate control at 120 bpm will also consider adding further medication management including digoxin  Vitals:   10/18/20 0120 10/18/20 0144 10/18/20 0326 10/18/20 0542  BP: 100/76 100/76 129/85 90/73  Pulse: (!) 122 (!) 122 (!) 136 83  Resp: 18   18  Temp: 97.7 F (36.5 C)  97.8 F (36.6 C) 97.6 F (36.4 C)  TempSrc: Oral  Oral Oral  SpO2: 97%  99% 99%  Weight:      Height:         Intake/Output Summary (Last 24 hours) at 10/18/2020 0554 Last data filed at 10/18/2020 0500 Gross per 24 hour  Intake 880 ml  Output 600 ml  Net 280 ml      PHYSICAL EXAM  General: Well developed, well nourished, in no acute distress HEENT:  Normocephalic and atramatic Neck:  No JVD.  Lungs: Clear bilaterally to auscultation and percussion. Heart: Irregular. Normal S1 and S2 without gallops or murmurs.  Abdomen: Bowel sounds are positive, abdomen soft and non-tender  Msk:  Back normal, normal gait. Normal strength and tone for age. Extremities: No clubbing, cyanosis or edema.   Neuro: Alert and oriented X 3. Psych:  Good affect, responds appropriately   LABS: Basic Metabolic Panel: Recent Labs    10/16/20 0359 10/17/20 0354  NA 130* 132*  K 3.7 3.7  CL 96* 98  CO2 23 24  GLUCOSE 92 97  BUN 13 22  CREATININE 0.98 1.10*  CALCIUM 8.4* 8.6*  MG 2.3 1.8  PHOS  --  2.1*   Liver Function Tests: No results for input(s): AST, ALT, ALKPHOS, BILITOT, PROT, ALBUMIN in the last 72 hours. No results for input(s): LIPASE, AMYLASE in the last 72 hours. CBC: Recent Labs    10/18/20 0431  WBC 7.6   HGB 12.8  HCT 36.7  MCV 82.7  PLT 285   Cardiac Enzymes: No results for input(s): CKTOTAL, CKMB, CKMBINDEX, TROPONINI in the last 72 hours. BNP: Invalid input(s): POCBNP D-Dimer: No results for input(s): DDIMER in the last 72 hours. Hemoglobin A1C: No results for input(s): HGBA1C in the last 72 hours. Fasting Lipid Panel: No results for input(s): CHOL, HDL, LDLCALC, TRIG, CHOLHDL, LDLDIRECT in the last 72 hours. Thyroid Function Tests: No results for input(s): TSH, T4TOTAL, T3FREE, THYROIDAB in the last 72 hours.  Invalid input(s): FREET3 Anemia Panel: No results for input(s): VITAMINB12, FOLATE, FERRITIN, TIBC, IRON, RETICCTPCT in the last 72 hours.  CT CHEST WO CONTRAST  Result Date: 10/16/2020 CLINICAL DATA:  Lung nodule, follow-up; history of RIGHT lung cancer post radiation therapy EXAM: CT CHEST WITHOUT CONTRAST TECHNIQUE: Multidetector CT imaging of the chest was performed following the standard protocol without IV contrast. Sagittal and coronal MPR images reconstructed from axial data set. COMPARISON:  05/08/2019, 11/03/2018 FINDINGS: Cardiovascular: Atherosclerotic calcifications aorta, coronary arteries, proximal great vessels. Aorta normal caliber. Heart unremarkable. No pericardial effusion. Mediastinum/Nodes: Large hiatal hernia. Esophagus unremarkable. Tiny BILATERAL thyroid nodules up to 8 mm diameter; Not clinically significant; no follow-up imaging recommended (ref: J Am Coll Radiol. 2015 Feb;12(2): 143-50).No definite thoracic adenopathy. Slightly prominent RIGHT pulmonary  hilum but not grossly changed. Lungs/Pleura: Radiation fibrosis changes again identified RIGHT upper lobe with associated parenchymal calcifications. Changes extend to the RIGHT hilum. 4 mm RIGHT upper lobe nodule image 40 unchanged. 6 x 4 mm RIGHT upper lobe nodule image 39, measured 4 x 4 mm on the previous exam but 6 x 4 mm on the earlier study of 11/03/2018. RIGHT upper lobe nodule 7 x 4 mm image 46  unchanged. Additional scattered nodular foci and calcified granulomata in both lungs including a 6 mm LEFT upper lobe nodule image 39 unchanged. New patchy area of consolidation in RIGHT lower lobe laterally consistent with pneumonia. No pleural effusion or pneumothorax. No definite new or additional enlarging nodules seen. Upper Abdomen: 7.0 cm diameter partially calcified splenic mass with globular enlargement of spleen unchanged. Post cholecystectomy. Atherosclerotic calcifications aorta and SMA. Musculoskeletal: Osseous demineralization. IMPRESSION: Radiation fibrosis changes RIGHT upper lobe. Stable BILATERAL pulmonary nodules. New patchy area of consolidation in RIGHT lower lobe laterally consistent with pneumonia. Large hiatal hernia. 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 Aortic Atherosclerosis (ICD10-I70.0). Electronically Signed   By: Lavonia Dana M.D.   On: 10/16/2020 13:03     Echo   TELEMETRY: Atrial fibrillation 100 bpm:  ASSESSMENT AND PLAN:  Active Problems:   Atrial fibrillation with RVR (HCC)   Dementia without behavioral disturbance (HCC)   Acute kidney injury superimposed on CKD (HCC)   Hyponatremia   Hypomagnesemia   Hypokalemia   Syncope   Weakness   Lobar pneumonia (HCC)   AF (paroxysmal atrial fibrillation) (HCC)   Hypophosphatemia    1. Atrial fibrillation with RVR, status post successful cardioversion 09/24/20, currently on PO amiodarone and metoprolol succinate, and Eliquis for stroke prevention.  Unfortunate patient has continued atrial fibrillation at this time with more rapid ventricular rate due to holding her metoprolol which will need further medication management 2.Syncope,in the setting of severe hypokalemia, hypomagnesemia, and hyponatremia, with recent poor PO intake, and atrial fibrillation with RVR. 3. Dementia 4. Severe hypokalemia, 2.5-> 3.7 5. Severe hypomagnesemia, 1.1-> 2.3 6. Hyponatremia, 127-> 132 7. Acute kidney injury,  creatinine 1.47 (up from 0.81 in 08/2020), now improved to 1.10 with IV fluids 8. Chronic systolic and diastolic CHF, does not appear fluid overloaded. BNP elevated to 445 (down from 1896 2 months ago).  Recommendations: 1.  Reinstatement of metoprolol this morning so that the patient can have better heart rate control but added digoxin due to needing help with heart rate control with no concerns for hypotension 2.  Continue anticoagulation for further risk reduction stroke with atrial fibrillation with no change 3.  No further cardiac intervention at this time 4.  Begin ambulation and follow for heart rate control and if okay at less than 110 bpm okay for discharge home from cardiac standpoint with follow-up next week for further adjustments  Corey Skains, MD, PhD, [facility] 10/18/2020 5:54 AM

## 2020-10-18 NOTE — Progress Notes (Signed)
Called [place] to give report to nurse.  The Network engineer at [employer] paged the nurse several times while I remained on hold.  [place] nurse never picked up; I left my name and number for the receiving nurse to call me for report.

## 2020-10-18 NOTE — Progress Notes (Signed)
Patient ID: Jody [name], female   DOB: 01-16-1941, 80 y.o.   MRN: [id number]  Patient was being loaded up for discharge and monitor placed on her and heart rate bouncing around between 85 and sometimes up to 140 but for a few brief seconds.  Blood pressure on the lower side and repeat blood pressure systolic 144.  Her blood pressure stays on the lower side and will continue to need the metoprolol despite having low blood pressure.  Her blood pressure sometimes goes into the 90s.  Patient is asymptomatic with not having any symptoms with fast heart rate or low blood pressure.  Case discussed with cardiology Dr. Nehemiah Massed and they are still okay with discharge out to rehab.  Nursing staff to speak with [facility] about the patient and they were okay bringing the patient over.  Dr. Loletha Grayer

## 2020-10-18 NOTE — Plan of Care (Signed)
  Problem: Clinical Measurements: Goal: Will remain free from infection Outcome: Progressing Goal: Respiratory complications will improve Outcome: Progressing   Problem: Coping: Goal: Level of anxiety will decrease Outcome: Progressing

## 2020-10-18 NOTE — Progress Notes (Signed)
EMS here to pick up patient.  Report given to EMS by [place] RN and patient on stretcher.  EMS returned with patient to floor stating that patient BP went down to 97/63 and heart rate between 70-140, patient is afib and asymptomatic. Patient BP has been running soft. EMS states they called their supervisor and they were told to go to ED speak to ED doctor who told them to bring patient back to floor.  Patient is afib.  Dr. Metro Kung called to evaluate patient.  Dr. Leslye Peer spoke to Dr. Nehemiah Massed and per cardiology patient is ok to discharge.  At this time patient BP 113/68 with afib 85-140 nonsustaining.  This RN called [facility] and spoke to receiving [place] and explained what was going on with patient.  Rise Paganini states that she was ready to receive patient and was OK to take her.  Rise Paganini was going to have this RN speak to [place] the DON but she had left for the day.  Page RN to call Rise Paganini to give her report on the patient.

## 2020-10-23 NOTE — H&P (Signed)
Patient 80 year old female with atrial fibrillation paroxysmal in nature needing electrical cardioversion.  Agree with Hassell Done note performed on 09/09/2020 and prepare for cardioversion to normal sinus rhythm.  Patient understands all risk and benefits of cardioversion.

## 2020-11-07 ENCOUNTER — Other Ambulatory Visit: Payer: Self-pay

## 2020-11-07 ENCOUNTER — Emergency Department: Payer: Medicare Other

## 2020-11-07 ENCOUNTER — Observation Stay
Admission: EM | Admit: 2020-11-07 | Discharge: 2020-11-11 | Disposition: A | Discharge status: Home / Self Care | Payer: Medicare Other | Attending: Family Medicine | Admitting: Family Medicine

## 2020-11-07 DIAGNOSIS — Z20822 Contact with and (suspected) exposure to covid-19: Secondary | ICD-10-CM | POA: Diagnosis not present

## 2020-11-07 DIAGNOSIS — N390 Urinary tract infection, site not specified: Secondary | ICD-10-CM | POA: Diagnosis present

## 2020-11-07 DIAGNOSIS — R55 Syncope and collapse: Secondary | ICD-10-CM | POA: Diagnosis present

## 2020-11-07 DIAGNOSIS — I509 Heart failure, unspecified: Secondary | ICD-10-CM | POA: Insufficient documentation

## 2020-11-07 DIAGNOSIS — Z79899 Other long term (current) drug therapy: Secondary | ICD-10-CM | POA: Insufficient documentation

## 2020-11-07 DIAGNOSIS — M6281 Muscle weakness (generalized): Secondary | ICD-10-CM | POA: Diagnosis not present

## 2020-11-07 DIAGNOSIS — F0390 Unspecified dementia without behavioral disturbance: Secondary | ICD-10-CM | POA: Diagnosis present

## 2020-11-07 DIAGNOSIS — Z7901 Long term (current) use of anticoagulants: Secondary | ICD-10-CM | POA: Insufficient documentation

## 2020-11-07 DIAGNOSIS — I110 Hypertensive heart disease with heart failure: Secondary | ICD-10-CM | POA: Insufficient documentation

## 2020-11-07 DIAGNOSIS — I10 Essential (primary) hypertension: Secondary | ICD-10-CM | POA: Diagnosis present

## 2020-11-07 DIAGNOSIS — I4811 Longstanding persistent atrial fibrillation: Secondary | ICD-10-CM | POA: Insufficient documentation

## 2020-11-07 DIAGNOSIS — I4891 Unspecified atrial fibrillation: Secondary | ICD-10-CM | POA: Diagnosis present

## 2020-11-07 LAB — URINALYSIS, COMPLETE (UACMP) WITH MICROSCOPIC
Bilirubin Urine: NEGATIVE
Glucose, UA: NEGATIVE mg/dL
Hgb urine dipstick: NEGATIVE
Ketones, ur: NEGATIVE mg/dL
Nitrite: NEGATIVE
Protein, ur: NEGATIVE mg/dL
Specific Gravity, Urine: 1.009 (ref 1.005–1.030)
WBC, UA: >50 WBC/hpf — ABNORMAL HIGH (ref 0–5)
pH: 7 (ref 5.0–8.0)

## 2020-11-07 LAB — CBC
HCT: 40.8 % (ref 36.0–46.0)
Hemoglobin: 14.2 g/dL (ref 12.0–15.0)
MCH: 28.9 pg (ref 26.0–34.0)
MCHC: 34.8 g/dL (ref 30.0–36.0)
MCV: 82.9 fL (ref 80.0–100.0)
Platelets: 380 10*3/uL (ref 150–400)
RBC: 4.92 MIL/uL (ref 3.87–5.11)
RDW: 16.1 % — ABNORMAL HIGH (ref 11.5–15.5)
WBC: 17.4 10*3/uL — ABNORMAL HIGH (ref 4.0–10.5)
nRBC: 0 % (ref 0.0–0.2)

## 2020-11-07 LAB — COMPREHENSIVE METABOLIC PANEL
ALT: 33 U/L (ref 0–44)
AST: 29 U/L (ref 15–41)
Albumin: 4 g/dL (ref 3.5–5.0)
Alkaline Phosphatase: 73 U/L (ref 38–126)
Anion gap: 17 — ABNORMAL HIGH (ref 5–15)
BUN: 24 mg/dL — ABNORMAL HIGH (ref 8–23)
CO2: 26 mmol/L (ref 22–32)
Calcium: 10.4 mg/dL — ABNORMAL HIGH (ref 8.9–10.3)
Chloride: 93 mmol/L — ABNORMAL LOW (ref 98–111)
Creatinine, Ser: 1.25 mg/dL — ABNORMAL HIGH (ref 0.44–1.00)
GFR, Estimated: 43 mL/min — ABNORMAL LOW (ref >60)
Glucose, Bld: 137 mg/dL — ABNORMAL HIGH (ref 70–99)
Potassium: 4.1 mmol/L (ref 3.5–5.1)
Sodium: 136 mmol/L (ref 135–145)
Total Bilirubin: 1.5 mg/dL — ABNORMAL HIGH (ref 0.3–1.2)
Total Protein: 7.7 g/dL (ref 6.5–8.1)

## 2020-11-07 LAB — TROPONIN I (HIGH SENSITIVITY)
Troponin I (High Sensitivity): 21 ng/L — ABNORMAL HIGH (ref <18)
Troponin I (High Sensitivity): 17 ng/L (ref <18)

## 2020-11-07 LAB — RESP PANEL BY RT-PCR (FLU A&B, COVID) ARPGX2
Influenza A by PCR: NEGATIVE
Influenza B by PCR: NEGATIVE
SARS Coronavirus 2 by RT PCR: NEGATIVE

## 2020-11-07 MED ORDER — FLUOXETINE HCL 20 MG PO CAPS
40.0000 mg | ORAL_CAPSULE | Freq: Every day | ORAL | Status: DC
  Administered 2020-11-08 – 2020-11-11 (×4): 40 mg via ORAL
  Filled 2020-11-07 (×4): qty 2

## 2020-11-07 MED ORDER — CALCIUM CARBONATE-VITAMIN D 600-200 MG-UNIT PO TABS: 1.0000 | ORAL_TABLET | Freq: Every day | ORAL | Status: DC

## 2020-11-07 MED ORDER — ALPRAZOLAM 0.5 MG PO TABS
0.5000 mg | ORAL_TABLET | Freq: Two times a day (BID) | ORAL | Status: DC | PRN
  Administered 2020-11-08 – 2020-11-10 (×3): 0.5 mg via ORAL
  Filled 2020-11-07 (×4): qty 1

## 2020-11-07 MED ORDER — SODIUM CHLORIDE 0.9 % IV BOLUS
500.0000 mL | Freq: Once | INTRAVENOUS | Status: AC
  Administered 2020-11-07: 500 mL via INTRAVENOUS

## 2020-11-07 MED ORDER — FUROSEMIDE 20 MG PO TABS: 20.0000 mg | ORAL_TABLET | Freq: Every day | ORAL | Status: DC | PRN

## 2020-11-07 MED ORDER — ONDANSETRON HCL 4 MG/2ML IJ SOLN: 4.0000 mg | Freq: Four times a day (QID) | INTRAMUSCULAR | Status: DC | PRN

## 2020-11-07 MED ORDER — SODIUM CHLORIDE 0.9 % IV SOLN: INTRAVENOUS | Status: AC

## 2020-11-07 MED ORDER — ACETAMINOPHEN 325 MG PO TABS: 650.0000 mg | ORAL_TABLET | Freq: Four times a day (QID) | ORAL | Status: DC | PRN

## 2020-11-07 MED ORDER — APIXABAN 2.5 MG PO TABS
2.5000 mg | ORAL_TABLET | Freq: Two times a day (BID) | ORAL | Status: DC
  Administered 2020-11-07 – 2020-11-11 (×8): 2.5 mg via ORAL
  Filled 2020-11-07 (×9): qty 1

## 2020-11-07 MED ORDER — MEMANTINE HCL 5 MG PO TABS
5.0000 mg | ORAL_TABLET | Freq: Two times a day (BID) | ORAL | Status: DC
  Administered 2020-11-07 – 2020-11-11 (×8): 5 mg via ORAL
  Filled 2020-11-07 (×9): qty 1

## 2020-11-07 MED ORDER — LACTATED RINGERS IV BOLUS
1000.0000 mL | Freq: Once | INTRAVENOUS | Status: AC
  Administered 2020-11-07: 1000 mL via INTRAVENOUS

## 2020-11-07 MED ORDER — MEGESTROL ACETATE 400 MG/10ML PO SUSP
625.0000 mg | Freq: Every day | ORAL | Status: DC
  Administered 2020-11-08 – 2020-11-11 (×4): 625 mg via ORAL
  Filled 2020-11-07 (×4): qty 20

## 2020-11-07 MED ORDER — METOPROLOL TARTRATE 5 MG/5ML IV SOLN
2.5000 mg | Freq: Once | INTRAVENOUS | Status: AC
  Administered 2020-11-07: 2.5 mg via INTRAVENOUS
  Filled 2020-11-07: qty 5

## 2020-11-07 MED ORDER — ONDANSETRON HCL 4 MG PO TABS
4.0000 mg | ORAL_TABLET | Freq: Four times a day (QID) | ORAL | Status: DC | PRN
Start: 2020-11-07 — End: 2020-11-11

## 2020-11-07 MED ORDER — DIGOXIN 125 MCG PO TABS
0.1250 mg | ORAL_TABLET | Freq: Every day | ORAL | Status: DC
  Administered 2020-11-08 – 2020-11-11 (×4): 0.125 mg via ORAL
  Filled 2020-11-07 (×4): qty 1

## 2020-11-07 MED ORDER — MAGNESIUM OXIDE -MG SUPPLEMENT 400 (240 MG) MG PO TABS
400.0000 mg | ORAL_TABLET | Freq: Every day | ORAL | Status: DC
  Administered 2020-11-08 – 2020-11-11 (×4): 400 mg via ORAL
  Filled 2020-11-07 (×4): qty 1

## 2020-11-07 MED ORDER — SODIUM CHLORIDE 0.9 % IV SOLN
1.0000 g | INTRAVENOUS | Status: AC
  Administered 2020-11-08 – 2020-11-09 (×2): 1 g via INTRAVENOUS
  Filled 2020-11-07 (×2): qty 1

## 2020-11-07 MED ORDER — PANTOPRAZOLE SODIUM 40 MG PO TBEC
40.0000 mg | DELAYED_RELEASE_TABLET | Freq: Every day | ORAL | Status: DC
  Administered 2020-11-08 – 2020-11-11 (×4): 40 mg via ORAL
  Filled 2020-11-07 (×4): qty 1

## 2020-11-07 MED ORDER — SODIUM CHLORIDE 0.9 % IV SOLN
1.0000 g | Freq: Once | INTRAVENOUS | Status: AC
  Administered 2020-11-07: 1 g via INTRAVENOUS
  Filled 2020-11-07: qty 10

## 2020-11-07 MED ORDER — ADULT MULTIVITAMIN W/MINERALS CH
1.0000 | ORAL_TABLET | Freq: Every day | ORAL | Status: DC
  Administered 2020-11-08 – 2020-11-11 (×4): 1 via ORAL
  Filled 2020-11-07 (×4): qty 1

## 2020-11-07 MED ORDER — METOPROLOL SUCCINATE ER 50 MG PO TB24
50.0000 mg | ORAL_TABLET | Freq: Every day | ORAL | Status: DC
  Administered 2020-11-08 – 2020-11-11 (×4): 50 mg via ORAL
  Filled 2020-11-07 (×4): qty 1

## 2020-11-07 MED ORDER — TRAZODONE HCL 50 MG PO TABS: 50.0000 mg | ORAL_TABLET | Freq: Every day | ORAL | Status: DC

## 2020-11-07 MED ORDER — TRAMADOL HCL 50 MG PO TABS: 50.0000 mg | ORAL_TABLET | Freq: Two times a day (BID) | ORAL | Status: DC | PRN

## 2020-11-07 MED ORDER — AMIODARONE HCL 200 MG PO TABS: 200.0000 mg | ORAL_TABLET | Freq: Two times a day (BID) | ORAL | Status: DC

## 2020-11-07 MED ORDER — SODIUM CHLORIDE 0.9% FLUSH
3.0000 mL | Freq: Two times a day (BID) | INTRAVENOUS | Status: DC
  Administered 2020-11-07 – 2020-11-10 (×4): 3 mL via INTRAVENOUS

## 2020-11-07 NOTE — ED Notes (Signed)
Dr Archie Balboa present at bedside to discuss poc.

## 2020-11-07 NOTE — ED Notes (Signed)
Dr Katherine Basset per husband's request, he is requesting update on plan after medications, specifically whether we plan to admit or discharge the pt.

## 2020-11-07 NOTE — ED Notes (Signed)
In and out cath performed with 30f female urinary collection kit using sterile procedure for specimen collection. Patient tolerated well.

## 2020-11-07 NOTE — ED Provider Notes (Signed)
[facility] Emergency Department Provider Note   ____________________________________________    I have reviewed the triage vital signs and the nursing notes.   HISTORY  Chief Complaint Loss of Consciousness     HPI Jody [name] is a 80 y.o. female with a history of atrial fibrillation, CHF, additional history as detailed below who presents after a near syncopal or syncopal episode.  Reportedly the patient went to her primary care office today, afterwards started to feel nauseated and apparently had a possible syncopal episode.  Did not fall.  is feeling better now.  However history is limited as the patient appears to have dementia.  Medical records reviewed, patient admitted on May 14 for atrial fibrillation pneumonia and syncopal episode   Past Medical History:  Diagnosis Date  . Asthma   . Atrial fibrillation ([place])   . CHF (congestive heart failure) ([place])   . 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 Dysrhythmia   . GERD (gastroesophageal reflux disease)   . Hypertension   . Memory difficulties   . 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   . Uses walker     Patient Active Problem List   Diagnosis Date Noted  . Hypophosphatemia   . Lobar pneumonia ([place])   . AF (paroxysmal atrial fibrillation) ([place])   . Acute kidney injury superimposed on CKD ([place])   . Hyponatremia   . Hypomagnesemia   . Hypokalemia   . Syncope   . Weakness   . Acute on chronic congestive heart failure ([place])   . Pleural effusion on right   . Dementia without behavioral disturbance ([place])   . Atrial fibrillation with rapid ventricular response ([place]) 08/29/2020  . Chronic a-fib ([place]) 11/28/2018  . History of colostomy reversal 11/28/2018  . Perforation bowel ([place])   . Poor appetite   . Acute delirium   . Goals of care, counseling/discussion   . Palliative care encounter   . Diverticulitis of colon with perforation  12/27/2017  . Atrial fibrillation with RVR ([place]) 12/27/2017  . Malignant neoplasm of upper lobe of right lung ([place]) 12/31/2015  . 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 TI (tricuspid incompetence) 01/21/2015  . Anxiety 01/13/2015  . Clinical depression 01/13/2015  . 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 Borderline diabetes mellitus 01/13/2015  . Benign essential HTN 01/06/2015  . Essential (primary) hypertension 01/06/2015  . Atrial fibrillation, chronic ([place]) 05/20/2014  . 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 Chronic atrial fibrillation ([place]) 05/20/2014  . 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 Cough 11/20/2013  . Breath shortness 11/01/2013  . Breathlessness on exertion 10/30/2013    Past Surgical History:  Procedure Laterality Date  . CARDIOVERSION N/A 09/24/2020   Procedure: CARDIOVERSION;  Surgeon: Corey Skains, MD;  Location: [facility] ORS;  Service: Cardiovascular;  Laterality: N/A;  . CATARACT EXTRACTION W/PHACO Left 04/29/2020   Procedure: CATARACT EXTRACTION PHACO AND INTRAOCULAR LENS PLACEMENT (IOC) LEFT 17.47 01:28.8;  Surgeon: Birder Robson, MD;  Location: [facility];  Service: Ophthalmology;  Laterality: Left;  . CATARACT EXTRACTION W/PHACO Right 05/20/2020   Procedure: CATARACT EXTRACTION PHACO AND INTRAOCULAR LENS PLACEMENT (IOC) RIGHT 14.89 01:13.8;  Surgeon: Birder Robson, MD;  Location: [facility];  Service: Ophthalmology;  Laterality: Right;  . COLOSTOMY N/A 01/05/2018   Procedure: COLOSTOMY;  Surgeon: Benjamine Sprague, DO;  Location: [facility] OR[facility];  Service: General;  Laterality: N/A;  . COLOSTOMY TAKEDOWN N/A 11/28/2018  Procedure: COLOSTOMY TAKEDOWN, DIABETIC;  Surgeon: Benjamine Sprague, DO;  Location: [facility];  Service: General;  Laterality: N/A;  . LAPAROTOMY N/A 01/05/2018   Procedure: EXPLORATORY LAPAROTOMY/ POSSIBLE BOWEL OBSTRUCTION;  Surgeon: Benjamine Sprague, DO;  Location: [facility] OR[facility];  Service: General;  Laterality: N/A;  . LYSIS  OF ADHESION N/A 01/05/2018   Procedure: LYSIS OF ADHESION;  Surgeon: Benjamine Sprague, DO;  Location: [facility];  Service: General;  Laterality: N/A;    Prior to Admission medications   Medication Sig Start Date End Date Taking? Authorizing Provider  acetaminophen (TYLENOL) 325 MG tablet Take 2 tablets (650 mg total) by mouth every 6 (six) hours as needed (fever > 101). Patient taking differently: Take 325 mg by mouth every 6 (six) hours as needed for moderate pain. 12/03/18   Olean Ree, MD  albuterol (PROVENTIL) (2.5 MG/3ML) 0.083% nebulizer solution Take 2.5 mg by nebulization every 6 (six) hours as needed for wheezing or shortness of breath.    [provider]  albuterol (VENTOLIN HFA) 108 (90 Base) MCG/ACT inhaler Inhale 2 puffs into the lungs every 6 (six) hours as needed for wheezing or shortness of breath.     [provider]  alendronate (FOSAMAX) 70 MG tablet Take 70 mg by mouth every Saturday.    [provider]  ALPRAZolam Duanne Moron) 0.25 MG tablet Take 1 tablet (0.25 mg total) by mouth 2 (two) times daily as needed for anxiety or sleep. 10/17/20   Loletha Grayer, MD  amiodarone (PACERONE) 200 MG tablet Two tabs po twice a day for three days then one tab po twice a day afterwards 10/18/20   Loletha Grayer, MD  apixaban (ELIQUIS) 2.5 MG TABS tablet Take 1 tablet (2.5 mg total) by mouth 2 (two) times daily. 09/01/20   Fritzi Mandes, MD  Calcium Carbonate-Vitamin D 600-200 MG-UNIT TABS Take 1 tablet by mouth daily.    [provider]  digoxin (LANOXIN) 0.125 MG tablet Take 1 tablet (0.125 mg total) by mouth daily. 10/18/20   Loletha Grayer, MD  feeding supplement (ENSURE ENLIVE / ENSURE PLUS) LIQD Take 237 mLs by mouth 2 (two) times daily between meals. 10/17/20   Loletha Grayer, MD  FLUoxetine (PROZAC) 40 MG capsule Take 40 mg by mouth daily.    [provider]  furosemide (LASIX) 20 MG tablet Take 1 tablet (20 mg total) by mouth daily as needed  (shortness of breath, weight gain of 3 lbs one day or 5 lbs one week). 10/17/20   Loletha Grayer, MD  magnesium oxide (MAG-OX) 400 (240 Mg) MG tablet Take 1 tablet (400 mg total) by mouth daily. 10/18/20   Loletha Grayer, MD  megestrol (MEGACE) 400 MG/10ML suspension Take 15.6 mLs (625 mg total) by mouth daily. 10/18/20   Loletha Grayer, MD  memantine (NAMENDA) 5 MG tablet Take 5 mg by mouth daily. 08/23/20   [provider]  metoprolol succinate (TOPROL XL) 50 MG 24 hr tablet Take 1 tablet (50 mg total) by mouth in the morning and at bedtime. Hold if BP is <120 and HR<70 09/01/20   Fritzi Mandes, MD  Multiple Vitamin (MULTI-VITAMINS) TABS Take 1 tablet by mouth daily.     [provider]  pantoprazole (PROTONIX) 40 MG tablet Take 1 tablet (40 mg total) by mouth daily. 10/18/20   Loletha Grayer, MD  traMADol (ULTRAM) 50 MG tablet Take 1 tablet (50 mg total) by mouth every 6 (six) hours as needed for severe pain. 10/17/20   Loletha Grayer, MD  traZODone (DESYREL) 50 MG tablet Take 1 tablet (50 mg total) by mouth at bedtime. 10/17/20   Loletha Grayer, MD     Allergies Donepezil, Poison oak extract, Ampicillin, and Penicillin v potassium  Family History  Problem Relation Age of Onset  . Breast cancer Paternal Aunt     Social History Social History   Tobacco Use  . Smoking status: Never Smoker  . Smokeless tobacco: Never Used  Vaping Use  . Vaping Use: Never used  Substance Use Topics  . Alcohol use: No  . Drug use: Never    Review of Systems  Constitutional: No fever reported Eyes: No visual changes.  ENT: No sore throat. Cardiovascular: Denies chest pain. Respiratory: Denies shortness of breath. Gastrointestinal: No abdominal pain.   Genitourinary: Negative for dysuria. Musculoskeletal: Negative for back pain. Skin: Negative for rash. Neurological: Negative for headaches   ____________________________________________   PHYSICAL EXAM:  VITAL  SIGNS: ED Triage Vitals  Enc Vitals Group     BP 11/07/20 1210 134/77     Pulse Rate 11/07/20 1210 (!) 115     Resp 11/07/20 1210 20     Temp 11/07/20 1210 97.9 F (36.6 C)     Temp src --      SpO2 11/07/20 1210 98 %     Weight 11/07/20 1211 52 kg (114 lb 10.2 oz)     Height 11/07/20 1211 1.499 m (4\' 11" )     Head Circumference --      Peak Flow --      Pain Score 11/07/20 1210 0     Pain Loc --      Pain Edu? --      Excl. in [place]? --     Constitutional: Alert Eyes: Conjunctivae are normal.  Head: Atraumatic. Nose: No congestion/rhinnorhea.  Cardiovascular: Tachycardia, regular rhythm. Grossly normal heart sounds.  Good peripheral circulation. Respiratory: Normal respiratory effort.  No retractions. Lungs CTAB. Gastrointestinal: Soft and nontender. No distention.    Musculoskeletal: Warm and well perfused Neurologic:  Normal speech and language. No gross focal neurologic deficits are appreciated.  Skin:  Skin is warm, dry and intact. No rash noted. Psychiatric: Mood and affect are normal. Speech and behavior are normal.  ____________________________________________   LABS (all labs ordered are listed, but only abnormal results are displayed)  Labs Reviewed  CBC - Abnormal; Notable for the following components:      Result Value   WBC 17.4 (*)    RDW 16.1 (*)    All other components within normal limits  COMPREHENSIVE METABOLIC PANEL - Abnormal; Notable for the following components:   Chloride 93 (*)    Glucose, Bld 137 (*)    BUN 24 (*)    Creatinine, Ser 1.25 (*)    Calcium 10.4 (*)    Total Bilirubin 1.5 (*)    GFR, Estimated 43 (*)    Anion gap 17 (*)    All other components within normal limits  TROPONIN I (HIGH SENSITIVITY) - Abnormal; Notable for the following components:   Troponin I (High Sensitivity) 21 (*)    All other components within normal limits  RESP PANEL BY RT-PCR (FLU A&B, COVID) [id number]  URINALYSIS, COMPLETE (UACMP) WITH MICROSCOPIC   TROPONIN I (HIGH SENSITIVITY)   ____________________________________________  EKG  ED ECG REPORT I, Lavonia Drafts, the attending physician, personally viewed and interpreted this ECG.  Date: 11/07/2020  Rhythm: Atrial fibrillation QRS Axis: normal Intervals: Abnormal ST/T Wave abnormalities: Abnormal, nonspecific changes Narrative Interpretation:  no evidence of acute ischemia  ____________________________________________  RADIOLOGY  Chest x-ray read by me, no evidence of pneumonia ____________________________________________   PROCEDURES  Procedure(s) performed:yes  .1-3 Lead EKG Interpretation Performed by: Lavonia Drafts, MD Authorized by: Lavonia Drafts, MD     Interpretation: abnormal     ECG rate assessment: tachycardic     Rhythm: atrial fibrillation     Ectopy: none     Conduction: normal       Critical Care performed: No ____________________________________________   INITIAL IMPRESSION / ASSESSMENT AND PLAN / ED COURSE  Pertinent labs & imaging results that were available during my care of the patient were reviewed by me and considered in my medical decision making (see chart for details).  Patient presents after a syncopal versus near syncopal episode.  Overall well-appearing at this time however she does have a history of atrial fibrillation, reportedly she has taken her medications.  We will give IV fluids, obtain labs, chest x-ray and place the patient on cardiac monitor.  Patient's lab work notable for mildly elevated troponin this is actually lower than it was 3 weeks ago.  Elevated BUN/creatinine ratio consistent with mild dehydration, IV fluid bolus infusing.  Blood cell count is elevated at 17.4, this is nonspecific.  Pending urinalysis  Chest x-ray without evidence of pneumonia, will send for CT without contrast given elevated white blood cell count  COVID test is negative.  Have asked my colleague to follow-up on CT, second troponin  and orthostatics to determine disposition.      ____________________________________________   FINAL CLINICAL IMPRESSION(S) / ED DIAGNOSES  Final diagnoses:  Syncope, unspecified syncope type  Longstanding persistent atrial fibrillation ([place])        Note:  This document was prepared using Dragon voice recognition software and may include unintentional dictation errors.   Lavonia Drafts, MD 11/07/20 [phone]

## 2020-11-07 NOTE — H&P (Signed)
History and Physical    Jody [name] MVH:[id number] DOB: 1941/05/10 DOA: 11/07/2020  PCP: Baxter Hire, MD   Patient coming from: Home  I have personally briefly reviewed patient's old medical records in [place]  Chief Complaint: Syncope  Most of the history was obtained from patient's spouse Parthena Fergeson over the phone.  Patient is unable to provide any history due to her underlying dementia.  HPI: Jody [name] is a 80 y.o. female with medical history significant for asthma, atrial fibrillation, CHF, GERD and hypertension, who presented to the emergency room for evaluation of a  syncopal episode. Her husband states that he had taken her to a doctor's  appointment and on the way home she started vomiting.  Her caregiver took her into the shower to wash her up and patient suddenly went limp and unresponsive.  This was transient and lasted probably about 1 minute.  Her husband states he called EMS and when they arrived patient was found to be in A. fib.  She has a known history of atrial fibrillation. Her husband states that she had a similar episode a couple of weeks ago and was hospitalized for same. I am unable to do review of systems on this patient due to her underlying dementia. Labs show sodium 136, potassium 4.1, chloride 93, bicarb 26, glucose 137, BUN 24, creatinine 1.25, calcium 10.4, alkaline phosphatase 73, albumin 4.0, AST 29, ALT 33, total protein 7.7, total bilirubin 1.5, troponin 21 >> 17, white count 17.4, hemoglobin 14.2, hematocrit 40.8, MCV 82.9, RDW 16.1, platelet count 380 Respiratory viral panel is negative Chest x-ray reviewed by me shows no evidence of cardiopulmonary disease. CT scan of the chest without contrast shows patchy consolidation seen in right lower lobe on prior exam appears to have resolved, most consistent with improving inflammation. Stable bilateral pulmonary nodules are noted as described on prior exam. Stable findings consistent with  radiation fibrosis involving right upper lobe. Stable large calcified splenic mass is noted. Stable hiatal hernia. Coronary artery calcifications are noted. Aortic Atherosclerosis  Twelve-lead EKG reviewed by me shows rapid atrial fibrillation.   ED Course: Patient is an 80 year old Caucasian female with a history of dementia who was brought into the ER by EMS for evaluation after she had a witnessed syncopal episode at home. She also has pyuria and received a dose of IV Rocephin in the emergency room. Patient was recently hospitalized a couple of weeks ago for syncopal episode that was thought to be related to rapid atrial fibrillation and she was also treated for aspiration pneumonia.  She was discharged to  Rehab following that hospitalization. She will be referred to observation status for further evaluation.  Review of Systems: As per HPI otherwise all other systems reviewed and negative.    Past Medical History:  Diagnosis Date  . Asthma   . Atrial fibrillation ([place])   . CHF (congestive heart failure) ([place])   . 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 Dysrhythmia   . GERD (gastroesophageal reflux disease)   . Hypertension   . Memory difficulties   . Primary adenocarcinoma of right lung ([place]) 02/2005   Rad tx's.   . Uses walker     Past Surgical History:  Procedure Laterality Date  . CARDIOVERSION N/A 09/24/2020   Procedure: CARDIOVERSION;  Surgeon: Corey Skains, MD;  Location: [facility];  Service: Cardiovascular;  Laterality: N/A;  . CATARACT EXTRACTION W/PHACO Left 04/29/2020   Procedure:  CATARACT EXTRACTION PHACO AND INTRAOCULAR LENS PLACEMENT (IOC) LEFT 17.47 01:28.8;  Surgeon: Birder Robson, MD;  Location: [facility];  Service: Ophthalmology;  Laterality: Left;  . CATARACT EXTRACTION W/PHACO Right 05/20/2020   Procedure: CATARACT EXTRACTION PHACO AND INTRAOCULAR LENS PLACEMENT (IOC) RIGHT 14.89 01:13.8;  Surgeon: Birder Robson,  MD;  Location: [facility];  Service: Ophthalmology;  Laterality: Right;  . COLOSTOMY N/A 01/05/2018   Procedure: COLOSTOMY;  Surgeon: Benjamine Sprague, DO;  Location: [facility];  Service: General;  Laterality: N/A;  . COLOSTOMY TAKEDOWN N/A 11/28/2018   Procedure: COLOSTOMY TAKEDOWN, DIABETIC;  Surgeon: Benjamine Sprague, DO;  Location: [facility];  Service: General;  Laterality: N/A;  . LAPAROTOMY N/A 01/05/2018   Procedure: EXPLORATORY LAPAROTOMY/ POSSIBLE BOWEL OBSTRUCTION;  Surgeon: Benjamine Sprague, DO;  Location: [facility];  Service: General;  Laterality: N/A;  . LYSIS OF ADHESION N/A 01/05/2018   Procedure: LYSIS OF ADHESION;  Surgeon: Benjamine Sprague, DO;  Location: [facility];  Service: General;  Laterality: N/A;     reports that she has never smoked. She has never used smokeless tobacco. She reports that she does not drink alcohol and does not use drugs.  Allergies  Allergen Reactions  . Donepezil     Loss of appetite  . Poison Oak Extract Other (See Comments)    blistering  . Ampicillin Rash  . Penicillin V Potassium Rash    Has patient had a PCN reaction causing immediate rash, facial/tongue/throat swelling, SOB or lightheadedness with hypotension: No Has patient had a PCN reaction causing severe rash involving mucus membranes or skin necrosis: No Has patient had a PCN reaction that required hospitalization: No Has patient had a PCN reaction occurring within the last 10 years: No If all of the above answers are "NO", then may proceed with Cephalosporin use.     Family History  Problem Relation Age of Onset  . Breast cancer Paternal Aunt       Prior to Admission medications   Medication Sig Start Date End Date Taking? Authorizing Provider  acetaminophen (TYLENOL) 325 MG tablet Take 2 tablets (650 mg total) by mouth every 6 (six) hours as needed (fever > 101). Patient taking differently: Take 325 mg by mouth every 6 (six) hours as needed for moderate pain. 12/03/18  Yes Piscoya, Jacqulyn Bath, MD   ALPRAZolam Duanne Moron) 0.5 MG tablet Take 0.5 mg by mouth 2 (two) times daily as needed. 10/21/20  Yes [provider]  amiodarone (PACERONE) 200 MG tablet Two tabs po twice a day for three days then one tab po twice a day afterwards Patient taking differently: Take 200 mg by mouth 2 (two) times daily. 10/18/20  Yes Wieting, Richard, MD  apixaban (ELIQUIS) 2.5 MG TABS tablet Take 1 tablet (2.5 mg total) by mouth 2 (two) times daily. 09/01/20  Yes Fritzi Mandes, MD  FLUoxetine (PROZAC) 40 MG capsule Take 40 mg by mouth daily.   Yes [provider]  memantine (NAMENDA) 5 MG tablet Take 5 mg by mouth 2 (two) times daily. 08/23/20  Yes [provider]  metoprolol succinate (TOPROL XL) 50 MG 24 hr tablet Take 1 tablet (50 mg total) by mouth in the morning and at bedtime. Hold if BP is <120 and HR<70 09/01/20  Yes Fritzi Mandes, MD  traMADol (ULTRAM) 50 MG tablet Take 1 tablet (50 mg total) by mouth every 6 (six) hours as needed for severe pain. 10/17/20  Yes Loletha Grayer, MD  Calcium Carbonate-Vitamin D 600-200 MG-UNIT TABS Take 1  tablet by mouth daily.    [provider]  digoxin (LANOXIN) 0.125 MG tablet Take 1 tablet (0.125 mg total) by mouth daily. 10/18/20   Loletha Grayer, MD  feeding supplement (ENSURE ENLIVE / ENSURE PLUS) LIQD Take 237 mLs by mouth 2 (two) times daily between meals. 10/17/20   Loletha Grayer, MD  furosemide (LASIX) 20 MG tablet Take 1 tablet (20 mg total) by mouth daily as needed (shortness of breath, weight gain of 3 lbs one day or 5 lbs one week). 10/17/20   Loletha Grayer, MD  magnesium oxide (MAG-OX) 400 (240 Mg) MG tablet Take 1 tablet (400 mg total) by mouth daily. 10/18/20   Loletha Grayer, MD  megestrol (MEGACE) 400 MG/10ML suspension Take 15.6 mLs (625 mg total) by mouth daily. 10/18/20   Loletha Grayer, MD  Multiple Vitamin (MULTI-VITAMINS) TABS Take 1 tablet by mouth daily.     [provider]  pantoprazole (PROTONIX) 40 MG  tablet Take 1 tablet (40 mg total) by mouth daily. 10/18/20   Loletha Grayer, MD  traZODone (DESYREL) 50 MG tablet Take 1 tablet (50 mg total) by mouth at bedtime. 10/17/20   Loletha Grayer, MD    Physical Exam: Vitals:   11/07/20 1535 11/07/20 1600 11/07/20 1700 11/07/20 1830  BP: (S) 116/72 133/66 (!) 154/86 122/89  Pulse: (S) (!) 115 (!) 110 (!) 107 (!) 112  Resp:  (!) 30 (!) 25 (!) 25  Temp:      SpO2:  98% 94% 97%  Weight:      Height:         Vitals:   11/07/20 1535 11/07/20 1600 11/07/20 1700 11/07/20 1830  BP: (S) 116/72 133/66 (!) 154/86 122/89  Pulse: (S) (!) 115 (!) 110 (!) 107 (!) 112  Resp:  (!) 30 (!) 25 (!) 25  Temp:      SpO2:  98% 94% 97%  Weight:      Height:          Constitutional: Alert and oriented x only to person not to place or time. Not in any apparent distress HEENT:      Head: Normocephalic and atraumatic.         Eyes: PERLA, EOMI, Conjunctivae are normal. Sclera is non-icteric.       Mouth/Throat: Mucous membranes are moist.       Neck: Supple with no signs of meningismus. Cardiovascular:  Irregularly irregular, tachycardic. No murmurs, gallops, or rubs. 2+ symmetrical distal pulses are present . No JVD. No LE edema Respiratory: Respiratory effort normal .Lungs sounds clear bilaterally. No wheezes, crackles, or rhonchi.  Gastrointestinal: Soft, non tender, and non distended with positive bowel sounds.  Genitourinary: No CVA tenderness. Musculoskeletal: Nontender with normal range of motion in all extremities. No cyanosis, or erythema of extremities. Neurologic:  Face is symmetric. Moving all extremities. No gross focal neurologic deficits  Skin: Skin is warm, dry.  No rash or ulcers Psychiatric: Mood and affect are normal   Labs on Admission: I have personally reviewed following labs and imaging studies  CBC: Recent Labs  Lab 11/07/20 1211  WBC 17.4*  HGB 14.2  HCT 40.8  MCV 82.9  PLT 956   Basic Metabolic Panel: Recent Labs   Lab 11/07/20 1211  NA 136  K 4.1  CL 93*  CO2 26  GLUCOSE 137*  BUN 24*  CREATININE 1.25*  CALCIUM 10.4*   GFR: Estimated Creatinine Clearance: 25.6 mL/min (A) (by C-G formula based on SCr of 1.25 mg/dL (  H)). Liver Function Tests: Recent Labs  Lab 11/07/20 1211  AST 29  ALT 33  ALKPHOS 73  BILITOT 1.5*  PROT 7.7  ALBUMIN 4.0   No results for input(s): LIPASE, AMYLASE in the last 168 hours. No results for input(s): AMMONIA in the last 168 hours. Coagulation Profile: No results for input(s): INR, PROTIME in the last 168 hours. Cardiac Enzymes: No results for input(s): CKTOTAL, CKMB, CKMBINDEX, TROPONINI in the last 168 hours. BNP (last 3 results) No results for input(s): PROBNP in the last 8760 hours. HbA1C: No results for input(s): HGBA1C in the last 72 hours. CBG: No results for input(s): GLUCAP in the last 168 hours. Lipid Profile: No results for input(s): CHOL, HDL, LDLCALC, TRIG, CHOLHDL, LDLDIRECT in the last 72 hours. Thyroid Function Tests: No results for input(s): TSH, T4TOTAL, FREET4, T3FREE, THYROIDAB in the last 72 hours. Anemia Panel: No results for input(s): VITAMINB12, FOLATE, FERRITIN, TIBC, IRON, RETICCTPCT in the last 72 hours. Urine analysis:    Component Value Date/Time   COLORURINE YELLOW (A) 11/07/2020 1217   APPEARANCEUR CLOUDY (A) 11/07/2020 1217   LABSPEC 1.009 11/07/2020 1217   PHURINE 7.0 11/07/2020 1217   GLUCOSEU NEGATIVE 11/07/2020 1217   HGBUR NEGATIVE 11/07/2020 1217   BILIRUBINUR NEGATIVE 11/07/2020 1217   [place] 11/07/2020 1217   PROTEINUR NEGATIVE 11/07/2020 1217   NITRITE NEGATIVE 11/07/2020 1217   LEUKOCYTESUR MODERATE (A) 11/07/2020 1217    Radiological Exams on Admission: CT Chest Wo Contrast  Result Date: 11/07/2020 CLINICAL DATA:  Pneumonia. EXAM: CT CHEST WITHOUT CONTRAST TECHNIQUE: Multidetector CT imaging of the chest was performed following the standard protocol without IV contrast. COMPARISON:  Oct 16, 2020. FINDINGS: Cardiovascular: Atherosclerosis of thoracic aorta is noted without aneurysm formation. Normal cardiac size. No pericardial effusion. Coronary artery calcifications are noted. Mediastinum/Nodes: Stable appearance of thyroid gland. Large sliding-type hiatal hernia. No adenopathy is noted. Lungs/Pleura: No pneumothorax or pleural effusion is noted. 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 Patchy consolidation seen in right lower lobe anteriorly on prior exam appears to have resolved. Stable bilateral pulmonary nodules are noted as described on prior exam. Upper Abdomen: Stable large calcified splenic mass is noted. Musculoskeletal: No chest wall mass or suspicious bone lesions identified. IMPRESSION: Patchy consolidation seen in right lower lobe on prior exam appears to have resolved, most consistent with improving inflammation. Stable bilateral pulmonary nodules are noted as described on prior exam. 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 Stable large calcified splenic mass is noted. Stable hiatal hernia. Coronary artery calcifications are noted. Aortic Atherosclerosis (ICD10-I70.0). Electronically Signed   By: Marijo Conception M.D.   On: 11/07/2020 15:40   DG Chest Port 1 View  Result Date: 11/07/2020 CLINICAL DATA:  Nausea with syncopal episode. EXAM: PORTABLE CHEST 1 VIEW COMPARISON:  Radiographs 10/14/2020.  CT 10/16/2020. FINDINGS: 1211 hours. The heart size and mediastinal contours are stable with mild cardiomegaly and aortic atherosclerosis. Radiation changes in the right perihilar region and associated right upper lobe volume loss are stable. The patchy airspace opacity seen in the right lower lobe on the recent CT is no longer visualized. The left lung appears clear. There is no pleural effusion or pneumothorax. Small hiatal hernia and calcified splenic mass are noted. IMPRESSION: No evidence of active  cardiopulmonary process. Right lower lobe infiltrate seen on CT 3 weeks ago appears resolved. Electronically Signed   By: Richardean Sale M.D.   On: 11/07/2020 12:41  Assessment/Plan Principal Problem:   Syncope and collapse Active Problems:   Benign essential HTN   Atrial fibrillation with rapid ventricular response (HCC)   Dementia without behavioral disturbance (HCC)   Acute lower UTI   Hypercalcemia      Syncope and collapse Unclear etiology Orthostatic blood pressure checks Continue to get echocardiogram to assess LVEF and rule out aortic stenosis Place patient on cardiac monitor to rule out arrhythmias and she may benefit from an event monitor upon discharge We will consult cardiology Judicious IV fluid hydration    Atrial fibrillation currently ventricular rate Patient received a dose of IV metoprolol Continue oral metoprolol, digoxin and amiodarone Obtain digoxin levels Continue apixaban as primary prophylaxis for an acute stroke    Dementia Continue Namenda, alprazolam trazodone and fluoxetine.    Urinary tract infection Patient has significant pyuria We will treat patient with Rocephin 1 g IV daily until urine culture results become available     Hypercalcemia Mild and most likely secondary to exogenous administration of calcium Hold calcium carbonate for now  DVT prophylaxis: Apixaban Code Status: full code Family Communication: Greater than 50% of time was spent discussing patient's condition and plan of care with her husband over the phone.  All questions and concerns have been addressed.  He verbalizes understanding and agrees with the plan. Disposition Plan: Back to previous home environment Consults called: Cardiology Status: Observation    Genova Kiner MD [facility]     11/07/2020, 9:08 PM

## 2020-11-07 NOTE — ED Triage Notes (Addendum)
Pt arrives via ems from home, seen at [facility] clinic this morning and had episode of nausea x1, went home to shower and had a witnessed syncopal episode. It was witnessed by caregiver and they reported to ems it lasting aprox one minute, they assisted pt to sitting position - no falls or head trauma. Presents in A Fib rhythm, ekg with EMS showed A Fib, pt does have a history of this and caregiver states she took all home medications this morning.

## 2020-11-08 ENCOUNTER — Observation Stay
Admit: 2020-11-08 | Discharge: 2020-11-08 | Disposition: A | Discharge status: Home / Self Care | Payer: Medicare Other | Attending: Internal Medicine | Admitting: Internal Medicine

## 2020-11-08 ENCOUNTER — Encounter: Payer: Self-pay | Admitting: Internal Medicine

## 2020-11-08 DIAGNOSIS — R55 Syncope and collapse: Secondary | ICD-10-CM | POA: Diagnosis not present

## 2020-11-08 LAB — ECHOCARDIOGRAM COMPLETE
AR max vel: 1.72 cm2
AV Area VTI: 1.62 cm2
AV Area mean vel: 1.64 cm2
AV Mean grad: 3 mmHg
AV Peak grad: 5.1 mmHg
Ao pk vel: 1.13 m/s
Area-P 1/2: 5.84 cm2
Calc EF: 58.9 %
Height: 59 in
P 1/2 time: 636 msec
S' Lateral: 3.17 cm
Single Plane A2C EF: 62.6 %
Single Plane A4C EF: 48.2 %
Weight: 1834.23 oz

## 2020-11-08 LAB — CBC
HCT: 36.8 % (ref 36.0–46.0)
Hemoglobin: 12.3 g/dL (ref 12.0–15.0)
MCH: 28.8 pg (ref 26.0–34.0)
MCHC: 33.4 g/dL (ref 30.0–36.0)
MCV: 86.2 fL (ref 80.0–100.0)
Platelets: 265 10*3/uL (ref 150–400)
RBC: 4.27 MIL/uL (ref 3.87–5.11)
RDW: 16.7 % — ABNORMAL HIGH (ref 11.5–15.5)
WBC: 9.7 10*3/uL (ref 4.0–10.5)
nRBC: 0 % (ref 0.0–0.2)

## 2020-11-08 LAB — BASIC METABOLIC PANEL
Anion gap: 12 (ref 5–15)
BUN: 17 mg/dL (ref 8–23)
CO2: 23 mmol/L (ref 22–32)
Calcium: 9.4 mg/dL (ref 8.9–10.3)
Chloride: 98 mmol/L (ref 98–111)
Creatinine, Ser: 0.97 mg/dL (ref 0.44–1.00)
GFR, Estimated: 58 mL/min — ABNORMAL LOW (ref >60)
Glucose, Bld: 107 mg/dL — ABNORMAL HIGH (ref 70–99)
Potassium: 4.1 mmol/L (ref 3.5–5.1)
Sodium: 133 mmol/L — ABNORMAL LOW (ref 135–145)

## 2020-11-08 LAB — DIGOXIN LEVEL: Digoxin Level: 1.2 ng/mL (ref 0.8–2.0)

## 2020-11-08 MED ORDER — AMIODARONE HCL 200 MG PO TABS
200.0000 mg | ORAL_TABLET | Freq: Every day | ORAL | Status: DC
  Administered 2020-11-09 – 2020-11-11 (×3): 200 mg via ORAL
  Filled 2020-11-08 (×3): qty 1

## 2020-11-08 NOTE — ED Notes (Signed)
Patient's brief changed, purewick placed.

## 2020-11-08 NOTE — Progress Notes (Signed)
PROGRESS NOTE  Jody [name] PIR:[id number] DOB: 16-Apr-1941 DOA: 11/07/2020 PCP: Baxter Hire, MD  HPI/Recap of past 24 hours: Patient seen and examined at bedside in the emergency department. Patient does not remember what happened.  She denies any headache.  Assessment/Plan: Principal Problem:   Syncope and collapse Active Problems:   Benign essential HTN   Atrial fibrillation with rapid ventricular response (HCC)   Dementia without behavioral disturbance (HCC)   Acute lower UTI   Hypercalcemia #1 syncope continue IV fluids cardiology consult is pending  2.  Atrial fibrillation.  Rate is well controlled we will continue metoprolol digoxin and amiodarone continue apixaban  3.  Urinary tract infection continue ceftriaxone  4.  Dementia.  Patient is on Namenda and alprazolam as well as fluoxetine.  She did not remember what happened or why she was here.    Code Status: Full  Severity of Illness: The appropriate patient status for this patient is OBSERVATION. Observation status is judged to be reasonable and necessary in order to provide the required intensity of service to ensure the patient's safety. The patient's presenting symptoms, physical exam findings, and initial radiographic and laboratory data in the context of their medical condition is felt to place them at decreased risk for further clinical deterioration. Furthermore, it is anticipated that the patient will be medically stable for discharge from the hospital within 2 midnights of admission. The following factors support the patient status of observation.   " The patient's presenting symptoms include syncope "    Family Communication: Patient  Disposition Plan: Home when stable   Consultants:  None  Procedures:  None  Antimicrobials:  Ceftriaxone  DVT prophylaxis: Apixaban   Objective: Vitals:   11/08/20 0430 11/08/20 0500 11/08/20 0530 11/08/20 0800  BP: (!) 153/82 (!) 153/95 (!) 145/101  133/76  Pulse: 96 (!) 103 (!) 111 (!) 37  Resp: 20 19 19  (!) 21  Temp:      SpO2: 94% 92% 98% 100%  Weight:      Height:        Intake/Output Summary (Last 24 hours) at 11/08/2020 0945 Last data filed at 11/07/2020 2000 Gross per 24 hour  Intake 1600 ml  Output --  Net 1600 ml   Filed Weights   11/07/20 1211  Weight: 52 kg   Body mass index is 23.15 kg/m.  Exam:  . General: 80 y.o. year-old female well developed well nourished in no acute distress.  Alert and oriented x2 . Cardiovascular: Regular rate and rhythm with no rubs or gallops.  No thyromegaly or JVD noted.   Marland Kitchen Respiratory: Clear to auscultation with no wheezes or rales. Good inspiratory effort. . Abdomen: Soft nontender nondistended with normal bowel sounds x4 quadrants. . Musculoskeletal: No lower extremity edema. 2/4 pulses in all 4 extremities. . Skin: No ulcerative lesions noted or rashes, . Psychiatry: Mood is appropriate for condition and setting    Data Reviewed: CBC: Recent Labs  Lab 11/07/20 1211  WBC 17.4*  HGB 14.2  HCT 40.8  MCV 82.9  PLT 630   Basic Metabolic Panel: Recent Labs  Lab 11/07/20 1211  NA 136  K 4.1  CL 93*  CO2 26  GLUCOSE 137*  BUN 24*  CREATININE 1.25*  CALCIUM 10.4*   GFR: Estimated Creatinine Clearance: 25.6 mL/min (A) (by C-G formula based on SCr of 1.25 mg/dL (H)). Liver Function Tests: Recent Labs  Lab 11/07/20 1211  AST 29  ALT 33  ALKPHOS 73  BILITOT 1.5*  PROT 7.7  ALBUMIN 4.0   No results for input(s): LIPASE, AMYLASE in the last 168 hours. No results for input(s): AMMONIA in the last 168 hours. Coagulation Profile: No results for input(s): INR, PROTIME in the last 168 hours. Cardiac Enzymes: No results for input(s): CKTOTAL, CKMB, CKMBINDEX, TROPONINI in the last 168 hours. BNP (last 3 results) No results for input(s): PROBNP in the last 8760 hours. HbA1C: No results for input(s): HGBA1C in the last 72 hours. CBG: No results for input(s):  GLUCAP in the last 168 hours. Lipid Profile: No results for input(s): CHOL, HDL, LDLCALC, TRIG, CHOLHDL, LDLDIRECT in the last 72 hours. Thyroid Function Tests: No results for input(s): TSH, T4TOTAL, FREET4, T3FREE, THYROIDAB in the last 72 hours. Anemia Panel: No results for input(s): VITAMINB12, FOLATE, FERRITIN, TIBC, IRON, RETICCTPCT in the last 72 hours. Urine analysis:    Component Value Date/Time   COLORURINE YELLOW (A) 11/07/2020 1217   APPEARANCEUR CLOUDY (A) 11/07/2020 1217   LABSPEC 1.009 11/07/2020 1217   PHURINE 7.0 11/07/2020 1217   GLUCOSEU NEGATIVE 11/07/2020 1217   HGBUR NEGATIVE 11/07/2020 1217   BILIRUBINUR NEGATIVE 11/07/2020 1217   KETONESUR NEGATIVE 11/07/2020 1217   PROTEINUR NEGATIVE 11/07/2020 1217   NITRITE NEGATIVE 11/07/2020 1217   LEUKOCYTESUR MODERATE (A) 11/07/2020 1217   Sepsis Labs: @LABRCNTIP (procalcitonin:4,lacticidven:4)  ) Recent Results (from the past 240 hour(s))  Resp Panel by RT-PCR (Flu A&B, Covid) Nasopharyngeal Swab     Status: None   Collection Time: 11/07/20 12:57 PM   Specimen: Nasopharyngeal Swab; Nasopharyngeal(NP) swabs in vial transport medium  Result Value Ref Range Status   SARS Coronavirus 2 by RT PCR NEGATIVE NEGATIVE Final    Comment: (NOTE) SARS-CoV-2 target nucleic acids are NOT DETECTED.  The SARS-CoV-2 RNA is generally detectable in upper respiratory specimens during the acute phase of infection. The lowest concentration of SARS-CoV-2 viral copies this assay can detect is 138 copies/mL. A negative result does not preclude SARS-Cov-2 infection and should not be used as the sole basis for treatment or other patient management decisions. A negative result may occur with  improper specimen collection/handling, submission of specimen other than nasopharyngeal swab, presence of viral mutation(s) within the areas targeted by this assay, and inadequate number of viral copies(<138 copies/mL). A negative result must be  combined with clinical observations, patient history, and epidemiological information. The expected result is Negative.  Fact Sheet for Patients:  EntrepreneurPulse.com.au  Fact Sheet for Healthcare Providers:  IncredibleEmployment.be  This test is no t yet approved or cleared by the [place] FDA and  has been authorized for detection and/or diagnosis of SARS-CoV-2 by FDA under an Emergency Use Authorization (EUA). This EUA will remain  in effect (meaning this test can be used) for the duration of the COVID-19 declaration under Section 564(b)(1) of the Act, 21 U.S.C.section 360bbb-3(b)(1), unless the authorization is terminated  or revoked sooner.       Influenza A by PCR NEGATIVE NEGATIVE Final   Influenza B by PCR NEGATIVE NEGATIVE Final    Comment: (NOTE) The Xpert Xpress SARS-CoV-2/FLU/RSV plus assay is intended as an aid in the diagnosis of influenza from Nasopharyngeal swab specimens and should not be used as a sole basis for treatment. Nasal washings and aspirates are unacceptable for Xpert Xpress SARS-CoV-2/FLU/RSV testing.  Fact Sheet for Patients: EntrepreneurPulse.com.au  Fact Sheet for Healthcare Providers: IncredibleEmployment.be  This test is not yet approved or cleared by the [place] FDA and has been authorized for detection  and/or diagnosis of SARS-CoV-2 by FDA under an Emergency Use Authorization (EUA). This EUA will remain in effect (meaning this test can be used) for the duration of the COVID-19 declaration under Section 564(b)(1) of the Act, 21 U.S.C. section 360bbb-3(b)(1), unless the authorization is terminated or revoked.  Performed at [facility], [address] [place], [place] [address]       Studies: CT Chest Wo Contrast  Result Date: 11/07/2020 CLINICAL DATA:  Pneumonia. EXAM: CT CHEST WITHOUT CONTRAST TECHNIQUE: Multidetector CT imaging of the  chest was performed following the standard protocol without IV contrast. COMPARISON:  Oct 16, 2020. FINDINGS: Cardiovascular: Atherosclerosis of thoracic aorta is noted without aneurysm formation. Normal cardiac size. No pericardial effusion. Coronary artery calcifications are noted. Mediastinum/Nodes: Stable appearance of thyroid gland. Large sliding-type hiatal hernia. No adenopathy is noted. Lungs/Pleura: No pneumothorax or pleural effusion is noted. 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 Patchy consolidation seen in right lower lobe anteriorly on prior exam appears to have resolved. Stable bilateral pulmonary nodules are noted as described on prior exam. Upper Abdomen: Stable large calcified splenic mass is noted. Musculoskeletal: No chest wall mass or suspicious bone lesions identified. IMPRESSION: Patchy consolidation seen in right lower lobe on prior exam appears to have resolved, most consistent with improving inflammation. Stable bilateral pulmonary nodules are noted as described on prior exam. 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 Stable large calcified splenic mass is noted. Stable hiatal hernia. Coronary artery calcifications are noted. Aortic Atherosclerosis (ICD10-I70.0). Electronically Signed   By: Marijo Conception M.D.   On: 11/07/2020 15:40   DG Chest Port 1 View  Result Date: 11/07/2020 CLINICAL DATA:  Nausea with syncopal episode. EXAM: PORTABLE CHEST 1 VIEW COMPARISON:  Radiographs 10/14/2020.  CT 10/16/2020. FINDINGS: 1211 hours. The heart size and mediastinal contours are stable with mild cardiomegaly and aortic atherosclerosis. Radiation changes in the right perihilar region and associated right upper lobe volume loss are stable. The patchy airspace opacity seen in the right lower lobe on the recent CT is no longer visualized. The left lung appears clear. There is no pleural effusion or pneumothorax. Small  hiatal hernia and calcified splenic mass are noted. IMPRESSION: No evidence of active cardiopulmonary process. Right lower lobe infiltrate seen on CT 3 weeks ago appears resolved. Electronically Signed   By: Richardean Sale M.D.   On: 11/07/2020 12:41   ECHOCARDIOGRAM COMPLETE  Result Date: 11/08/2020    ECHOCARDIOGRAM REPORT   Patient Name:   Jody [name] Date of Exam: 11/08/2020 Medical Rec #:  [id number]      Height:       59.0 in Accession #:    [id number]     Weight:       114.6 lb Date of Birth:  1940/06/16       BSA:          1.456 m Patient Age:    41 years       BP:           134/77 mmHg Patient Gender: F              HR:           115 bpm. Exam Location:  [facility] Procedure: 2D Echo Indications:     Syncope  History:         Patient has prior history of Echocardiogram examinations. CHF,  Arrythmias:Atrial Fibrillation; Risk Factors:Hypertension.  Sonographer:     L Thornton-Maynard Referring Phys:  [id number] Diagnosing Phys: Bartholome Bill MD IMPRESSIONS  1. Left ventricular ejection fraction, by estimation, is 60 to 65%. The left ventricle has normal function. The left ventricle has no regional wall motion abnormalities. There is mild left ventricular hypertrophy. Left ventricular diastolic parameters are consistent with Grade I diastolic dysfunction (impaired relaxation).  2. Right ventricular systolic function is normal. The right ventricular size is normal. There is normal pulmonary artery systolic pressure.  3. Left atrial size was mild to moderately dilated.  4. Right atrial size was mild to moderately dilated.  5. The mitral valve is degenerative. Mild to moderate mitral valve regurgitation.  6. The aortic valve was not well visualized. Aortic valve regurgitation is mild. FINDINGS  Left Ventricle: Left ventricular ejection fraction, by estimation, is 60 to 65%. The left ventricle has normal function. The left ventricle has no regional wall motion abnormalities. The left  ventricular internal cavity size was normal in size. There is  mild left ventricular hypertrophy. Left ventricular diastolic parameters are consistent with Grade I diastolic dysfunction (impaired relaxation). Right Ventricle: The right ventricular size is normal. No increase in right ventricular wall thickness. Right ventricular systolic function is normal. There is normal pulmonary artery systolic pressure. The tricuspid regurgitant velocity is 2.66 m/s, and  with an assumed right atrial pressure of 3 mmHg, the estimated right ventricular systolic pressure is 35.3 mmHg. Left Atrium: Left atrial size was mild to moderately dilated. Right Atrium: Right atrial size was mild to moderately dilated. Pericardium: There is no evidence of pericardial effusion. Mitral Valve: The mitral valve is degenerative in appearance. Mild to moderate mitral valve regurgitation. Tricuspid Valve: The tricuspid valve is grossly normal. Tricuspid valve regurgitation is mild. Aortic Valve: The aortic valve was not well visualized. Aortic valve regurgitation is mild. Aortic regurgitation PHT measures 636 msec. Aortic valve mean gradient measures 3.0 mmHg. Aortic valve peak gradient measures 5.1 mmHg. Aortic valve area, by VTI measures 1.62 cm. Pulmonic Valve: The pulmonic valve was not well visualized. Pulmonic valve regurgitation is trivial. Aorta: The aortic root is normal in size and structure. IAS/Shunts: The atrial septum is grossly normal.  LEFT VENTRICLE PLAX 2D LVIDd:         4.70 cm     Diastology LVIDs:         3.17 cm     LV e' medial:    6.31 cm/s LV PW:         1.00 cm     LV E/e' medial:  16.8 LV IVS:        1.14 cm     LV e' lateral:   7.83 cm/s LVOT diam:     1.90 cm     LV E/e' lateral: 13.5 LV SV:         29 LV SV Index:   20 LVOT Area:     2.84 cm  LV Volumes (MOD) LV vol d, MOD A2C: 69.7 ml LV vol d, MOD A4C: 41.7 ml LV vol s, MOD A2C: 26.1 ml LV vol s, MOD A4C: 21.6 ml LV SV MOD A2C:     43.6 ml LV SV MOD A4C:     41.7  ml LV SV MOD BP:      33.9 ml RIGHT VENTRICLE RV S prime:     11.00 cm/s TAPSE (M-mode): 1.7 cm LEFT ATRIUM  Index       RIGHT ATRIUM           Index LA diam:        4.90 cm  3.37 cm/m  RA Area:     24.50 cm LA Vol (A2C):   78.3 ml  53.78 ml/m RA Volume:   65.90 ml  45.26 ml/m LA Vol (A4C):   102.0 ml 70.06 ml/m LA Biplane Vol: 94.6 ml  64.98 ml/m  AORTIC VALVE                   PULMONIC VALVE AV Area (Vmax):    1.72 cm    PV Vmax:       1.09 m/s AV Area (Vmean):   1.64 cm    PV Peak grad:  4.8 mmHg AV Area (VTI):     1.62 cm AV Vmax:           113.00 cm/s AV Vmean:          86.700 cm/s AV VTI:            0.179 m AV Peak Grad:      5.1 mmHg AV Mean Grad:      3.0 mmHg LVOT Vmax:         68.50 cm/s LVOT Vmean:        50.200 cm/s LVOT VTI:          0.102 m LVOT/AV VTI ratio: 0.57 AI PHT:            636 msec  AORTA Ao Root diam: 2.80 cm MITRAL VALVE                TRICUSPID VALVE MV Area (PHT): 5.84 cm     TR Peak grad:   28.3 mmHg MV Decel Time: 130 msec     TR Vmax:        266.00 cm/s MV E velocity: 106.00 cm/s                             SHUNTS                             Systemic VTI:  0.10 m                             Systemic Diam: 1.90 cm Bartholome Bill MD Electronically signed by Bartholome Bill MD Signature Date/Time: 11/08/2020/8:41:29 AM    Final     Scheduled Meds: . amiodarone  200 mg Oral BID  . apixaban  2.5 mg Oral BID  . digoxin  0.125 mg Oral Daily  . FLUoxetine  40 mg Oral Daily  . magnesium oxide  400 mg Oral Daily  . megestrol  625 mg Oral Daily  . memantine  5 mg Oral BID  . metoprolol succinate  50 mg Oral Daily  . multivitamin with minerals  1 tablet Oral Daily  . pantoprazole  40 mg Oral Daily  . sodium chloride flush  3 mL Intravenous Q12H  . traZODone  50 mg Oral QHS    Continuous Infusions: . cefTRIAXone (ROCEPHIN)  IV       LOS: 0 days     Cristal Deer, MD [facility]  To reach me or the doctor on call, go to: [email or web address] Password  [email or web address]  11/08/2020, 9:45 AM

## 2020-11-08 NOTE — Consult Note (Signed)
Cardiology Consultation Note    Patient ID: Jody [name], MRN: [id number], DOB/AGE: 1941-03-22 80 y.o. Admit date: 11/07/2020   Date of Consult: 11/08/2020 Primary Physician: Baxter Hire, MD Primary Cardiologist: Dr. Nehemiah Massed  Chief Complaint: syncope Reason for Consultation: syncope Requesting MD: Dr. Kyung Bacca  HPI: Jody [name] is a 80 y.o. female with history of atrial fibrillation, CHF who was sent to the emergency room after a visit to her primary care's office.  After the visit she felt nauseated and apparently felt syncopal.  Apparently did not fall.  Stated she was feeling better although history was limited due to memory issues.  Was admitted May 14 for atrial fibrillation pneumonia and syncope.  She had a history of an attempted cardioversion on April 2022.  She is treated with amiodarone at 200 mg , apixaban 2.5 mg twice daily, furosemide 20 mg PRN, metoprolol succinate 50 mg twice daily.  Echo 7/21 revealed EF 40-45% with moderate mr and tr. EKG on admission showed atrial fibrillation with variable fairly well controlled  ventricular response.  Chest CT without contrast showed radiation fibrosis in the right upper lobe along with a calcified splenic mass which was stable and no stable hiatal hernia.  Chest x-ray showed no active cardiopulmonary disease.  Right lower lobe infiltrate seen on CT 3 weeks previously was resolved.  Empiric troponins were unremarkable.  Urinalysis showed moderate leukocytosis nitrate free.  Greater than 50 white cells.  Patient had a elevation in troponin from her baseline of approximately 1 to 1.25.  White count was 17.4.  She was placed on antibiotics empirically.  She was continued with amiodarone 200 mg daily digoxin 0.125 mg daily furosemide 20 mg as needed metoprolol succinate 50 mg daily.  Past Medical History:  Diagnosis Date  . Asthma   . Atrial fibrillation ([place])   . CHF (congestive heart failure) ([place])   . Complication of anesthesia    Slow to  wake after colostomy  . Dysrhythmia   . GERD (gastroesophageal reflux disease)   . Hypertension   . Memory difficulties   . Primary adenocarcinoma of right lung ([place]) 02/2005   Rad tx's.   . Uses walker       Surgical History:  Past Surgical History:  Procedure Laterality Date  . CARDIOVERSION N/A 09/24/2020   Procedure: CARDIOVERSION;  Surgeon: Corey Skains, MD;  Location: [facility];  Service: Cardiovascular;  Laterality: N/A;  . CATARACT EXTRACTION W/PHACO Left 04/29/2020   Procedure: CATARACT EXTRACTION PHACO AND INTRAOCULAR LENS PLACEMENT (IOC) LEFT 17.47 01:28.8;  Surgeon: Birder Robson, MD;  Location: [place];  Service: Ophthalmology;  Laterality: Left;  . CATARACT EXTRACTION W/PHACO Right 05/20/2020   Procedure: CATARACT EXTRACTION PHACO AND INTRAOCULAR LENS PLACEMENT (IOC) RIGHT 14.89 01:13.8;  Surgeon: Birder Robson, MD;  Location: [place];  Service: Ophthalmology;  Laterality: Right;  . COLOSTOMY N/A 01/05/2018   Procedure: COLOSTOMY;  Surgeon: Benjamine Sprague, DO;  Location: [facility];  Service: General;  Laterality: N/A;  . COLOSTOMY TAKEDOWN N/A 11/28/2018   Procedure: COLOSTOMY TAKEDOWN, DIABETIC;  Surgeon: Benjamine Sprague, DO;  Location: [facility];  Service: General;  Laterality: N/A;  . LAPAROTOMY N/A 01/05/2018   Procedure: EXPLORATORY LAPAROTOMY/ POSSIBLE BOWEL OBSTRUCTION;  Surgeon: Benjamine Sprague, DO;  Location: [facility];  Service: General;  Laterality: N/A;  . LYSIS OF ADHESION N/A 01/05/2018   Procedure: LYSIS OF ADHESION;  Surgeon: Benjamine Sprague, DO;  Location: [facility];  Service: General;  Laterality: N/A;  Home Meds: Prior to Admission medications   Medication Sig Start Date End Date Taking? Authorizing Provider  acetaminophen (TYLENOL) 325 MG tablet Take 2 tablets (650 mg total) by mouth every 6 (six) hours as needed (fever > 101). Patient taking differently: Take 325 mg by mouth every 6 (six) hours as needed for moderate pain. 12/03/18   Yes Piscoya, Jacqulyn Bath, MD  alendronate (FOSAMAX) 70 MG tablet Take 70 mg by mouth once a week. Take with a full glass of water on an empty stomach.   Yes [provider]  ALPRAZolam Duanne Moron) 0.5 MG tablet Take 0.5 mg by mouth 2 (two) times daily as needed. 10/21/20  Yes [provider]  amiodarone (PACERONE) 200 MG tablet Two tabs po twice a day for three days then one tab po twice a day afterwards Patient taking differently: Take 200 mg by mouth daily. 10/18/20  Yes Wieting, Richard, MD  apixaban (ELIQUIS) 2.5 MG TABS tablet Take 1 tablet (2.5 mg total) by mouth 2 (two) times daily. 09/01/20  Yes Fritzi Mandes, MD  diltiazem (DILACOR XR) 120 MG 24 hr capsule Take 120 mg by mouth daily.   Yes [provider]  FLUoxetine (PROZAC) 40 MG capsule Take 40 mg by mouth daily.   Yes [provider]  furosemide (LASIX) 20 MG tablet Take 1 tablet (20 mg total) by mouth daily as needed (shortness of breath, weight gain of 3 lbs one day or 5 lbs one week). 10/17/20  Yes Wieting, Richard, MD  lovastatin (MEVACOR) 20 MG tablet Take 20 mg by mouth at bedtime.   Yes [provider]  megestrol (MEGACE) 400 MG/10ML suspension Take 15.6 mLs (625 mg total) by mouth daily. 10/18/20  Yes Wieting, Richard, MD  memantine (NAMENDA) 5 MG tablet Take 5 mg by mouth 2 (two) times daily. 08/23/20  Yes [provider]  metoprolol succinate (TOPROL XL) 50 MG 24 hr tablet Take 1 tablet (50 mg total) by mouth in the morning and at bedtime. Hold if BP is <120 and HR<70 09/01/20  Yes Fritzi Mandes, MD  omeprazole (PRILOSEC) 20 MG capsule Take 20 mg by mouth daily.   Yes [provider]  traMADol (ULTRAM) 50 MG tablet Take 1 tablet (50 mg total) by mouth every 6 (six) hours as needed for severe pain. 10/17/20  Yes Wieting, Richard, MD  Calcium Carbonate-Vitamin D 600-200 MG-UNIT TABS Take 1 tablet by mouth daily. Patient not taking: Reported on 11/08/2020    [provider]  digoxin  (LANOXIN) 0.125 MG tablet Take 1 tablet (0.125 mg total) by mouth daily. Patient not taking: Reported on 11/08/2020 10/18/20   Loletha Grayer, MD  feeding supplement (ENSURE ENLIVE / ENSURE PLUS) LIQD Take 237 mLs by mouth 2 (two) times daily between meals. 10/17/20   Loletha Grayer, MD  magnesium oxide (MAG-OX) 400 (240 Mg) MG tablet Take 1 tablet (400 mg total) by mouth daily. Patient not taking: Reported on 11/08/2020 10/18/20   Loletha Grayer, MD  Multiple Vitamin (MULTI-VITAMINS) TABS Take 1 tablet by mouth daily.  Patient not taking: Reported on 11/08/2020    [provider]  pantoprazole (PROTONIX) 40 MG tablet Take 1 tablet (40 mg total) by mouth daily. Patient not taking: Reported on 11/08/2020 10/18/20   Loletha Grayer, MD  traZODone (DESYREL) 50 MG tablet Take 1 tablet (50 mg total) by mouth at bedtime. Patient not taking: Reported on 11/08/2020 10/17/20   Loletha Grayer, MD    Inpatient Medications:  . [START ON  11/09/2020] amiodarone  200 mg Oral Daily  . apixaban  2.5 mg Oral BID  . digoxin  0.125 mg Oral Daily  . FLUoxetine  40 mg Oral Daily  . magnesium oxide  400 mg Oral Daily  . megestrol  625 mg Oral Daily  . memantine  5 mg Oral BID  . metoprolol succinate  50 mg Oral Daily  . multivitamin with minerals  1 tablet Oral Daily  . pantoprazole  40 mg Oral Daily  . sodium chloride flush  3 mL Intravenous Q12H   . cefTRIAXone (ROCEPHIN)  IV      Allergies:  Allergies  Allergen Reactions  . Donepezil     Loss of appetite  . Poison Oak Extract Other (See Comments)    blistering  . Ampicillin Rash  . Penicillin V Potassium Rash    Has patient had a PCN reaction causing immediate rash, facial/tongue/throat swelling, SOB or lightheadedness with hypotension: No Has patient had a PCN reaction causing severe rash involving mucus membranes or skin necrosis: No Has patient had a PCN reaction that required hospitalization: No Has patient had a PCN reaction occurring  within the last 10 years: No If all of the above answers are "NO", then may proceed with Cephalosporin use.     Social History   Socioeconomic History  . Marital status: Married    Spouse name: Not on file  . Number of children: Not on file  . Years of education: Not on file  . Highest education level: Not on file  Occupational History  . Not on file  Tobacco Use  . Smoking status: Never Smoker  . Smokeless tobacco: Never Used  Vaping Use  . Vaping Use: Never used  Substance and Sexual Activity  . Alcohol use: No  . Drug use: Never  . Sexual activity: Not on file  Other Topics Concern  . Not on file  Social History Narrative  . Not on file   Social Determinants of Health   Financial Resource Strain: Not on file  Food Insecurity: Not on file  Transportation Needs: Not on file  Physical Activity: Not on file  Stress: Not on file  Social Connections: Not on file  Intimate Partner Violence: Not on file     Family History  Problem Relation Age of Onset  . Breast cancer Paternal Aunt      Review of Systems: A 12-system review of systems was performed and is negative except as noted in the HPI.  Labs: No results for input(s): CKTOTAL, CKMB, TROPONINI in the last 72 hours. Lab Results  Component Value Date   WBC 17.4 (H) 11/07/2020   HGB 14.2 11/07/2020   HCT 40.8 11/07/2020   MCV 82.9 11/07/2020   PLT 380 11/07/2020    Recent Labs  Lab 11/07/20 1211  NA 136  K 4.1  CL 93*  CO2 26  BUN 24*  CREATININE 1.25*  CALCIUM 10.4*  PROT 7.7  BILITOT 1.5*  ALKPHOS 73  ALT 33  AST 29  GLUCOSE 137*   Lab Results  Component Value Date   TRIG 119 01/16/2018   No results found for: DDIMER  Radiology/Studies:  CT Head Wo Contrast  Result Date: 10/15/2020 CLINICAL DATA:  Mental status change. Unknown cause. Syncopal episode. Status post ground level fall. EXAM: CT HEAD WITHOUT CONTRAST TECHNIQUE: Contiguous axial images were obtained from the base of the  skull through the vertex without intravenous contrast. COMPARISON:  CT head 01/11/2018 FINDINGS: Brain: Cerebral  ventricle sizes are concordant with the degree of cerebral volume loss. Patchy and confluent areas of decreased attenuation are noted throughout the deep and periventricular white matter of the cerebral hemispheres bilaterally, compatible with chronic microvascular ischemic disease. No evidence of large-territorial acute infarction. No parenchymal hemorrhage. No mass lesion. No extra-axial collection. No mass effect or midline shift. No hydrocephalus. Basilar cisterns are patent. Vascular: No hyperdense vessel. Atherosclerotic calcifications are present within the cavernous internal carotid arteries. Skull: No acute fracture or focal lesion. Sinuses/Orbits: Paranasal sinuses and mastoid air cells are clear. Bilateral lens replacement. Otherwise orbits are unremarkable. Other: None. IMPRESSION: No acute intracranial abnormality. Electronically Signed   By: Iven Finn M.D.   On: 10/15/2020 00:12   CT Chest Wo Contrast  Result Date: 11/07/2020 CLINICAL DATA:  Pneumonia. EXAM: CT CHEST WITHOUT CONTRAST TECHNIQUE: Multidetector CT imaging of the chest was performed following the standard protocol without IV contrast. COMPARISON:  Oct 16, 2020. FINDINGS: Cardiovascular: Atherosclerosis of thoracic aorta is noted without aneurysm formation. Normal cardiac size. No pericardial effusion. Coronary artery calcifications are noted. Mediastinum/Nodes: Stable appearance of thyroid gland. Large sliding-type hiatal hernia. No adenopathy is noted. Lungs/Pleura: No pneumothorax or pleural effusion is noted. 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 Patchy consolidation seen in right lower lobe anteriorly on prior exam appears to have resolved. Stable bilateral pulmonary nodules are noted as described on prior exam. Upper Abdomen: Stable large calcified splenic mass is noted.  Musculoskeletal: No chest wall mass or suspicious bone lesions identified. IMPRESSION: Patchy consolidation seen in right lower lobe on prior exam appears to have resolved, most consistent with improving inflammation. Stable bilateral pulmonary nodules are noted as described on prior exam. 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 Stable large calcified splenic mass is noted. Stable hiatal hernia. Coronary artery calcifications are noted. Aortic Atherosclerosis (ICD10-I70.0). Electronically Signed   By: Marijo Conception M.D.   On: 11/07/2020 15:40   CT CHEST WO CONTRAST  Result Date: 10/16/2020 CLINICAL DATA:  Lung nodule, follow-up; history of RIGHT lung cancer post radiation therapy EXAM: CT CHEST WITHOUT CONTRAST TECHNIQUE: Multidetector CT imaging of the chest was performed following the standard protocol without IV contrast. Sagittal and coronal MPR images reconstructed from axial data set. COMPARISON:  05/08/2019, 11/03/2018 FINDINGS: Cardiovascular: Atherosclerotic calcifications aorta, coronary arteries, proximal great vessels. Aorta normal caliber. Heart unremarkable. No pericardial effusion. Mediastinum/Nodes: Large hiatal hernia. Esophagus unremarkable. Tiny BILATERAL thyroid nodules up to 8 mm diameter; Not clinically significant; no follow-up imaging recommended (ref: J Am Coll Radiol. 2015 Feb;12(2): 143-50).No definite thoracic adenopathy. Slightly prominent RIGHT pulmonary hilum but not grossly changed. Lungs/Pleura: Radiation fibrosis changes again identified RIGHT upper lobe with associated parenchymal calcifications. Changes extend to the RIGHT hilum. 4 mm RIGHT upper lobe nodule image 40 unchanged. 6 x 4 mm RIGHT upper lobe nodule image 39, measured 4 x 4 mm on the previous exam but 6 x 4 mm on the earlier study of 11/03/2018. RIGHT upper lobe nodule 7 x 4 mm image 46 unchanged. Additional scattered nodular foci and calcified granulomata in both lungs  including a 6 mm LEFT upper lobe nodule image 39 unchanged. New patchy area of consolidation in RIGHT lower lobe laterally consistent with pneumonia. No pleural effusion or pneumothorax. No definite new or additional enlarging nodules seen. Upper Abdomen: 7.0 cm diameter partially calcified splenic mass with globular enlargement of spleen unchanged. Post cholecystectomy. Atherosclerotic calcifications aorta and SMA. Musculoskeletal: Osseous demineralization. IMPRESSION:  Radiation fibrosis changes RIGHT upper lobe. Stable BILATERAL pulmonary nodules. New patchy area of consolidation in RIGHT lower lobe laterally consistent with pneumonia. Large hiatal hernia. 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 Aortic Atherosclerosis (ICD10-I70.0). Electronically Signed   By: Lavonia Dana M.D.   On: 10/16/2020 13:03   DG Chest Port 1 View  Result Date: 11/07/2020 CLINICAL DATA:  Nausea with syncopal episode. EXAM: PORTABLE CHEST 1 VIEW COMPARISON:  Radiographs 10/14/2020.  CT 10/16/2020. FINDINGS: 1211 hours. The heart size and mediastinal contours are stable with mild cardiomegaly and aortic atherosclerosis. Radiation changes in the right perihilar region and associated right upper lobe volume loss are stable. The patchy airspace opacity seen in the right lower lobe on the recent CT is no longer visualized. The left lung appears clear. There is no pleural effusion or pneumothorax. Small hiatal hernia and calcified splenic mass are noted. IMPRESSION: No evidence of active cardiopulmonary process. Right lower lobe infiltrate seen on CT 3 weeks ago appears resolved. Electronically Signed   By: Richardean Sale M.D.   On: 11/07/2020 12:41   DG Chest Port 1 View  Result Date: 10/14/2020 CLINICAL DATA:  Recent syncopal episode EXAM: PORTABLE CHEST 1 VIEW COMPARISON:  08/29/2020 FINDINGS: Cardiac shadow is at the upper limits of normal in size. Aortic calcifications are noted. Previously seen right-sided pleural  effusion has resolved in the interval from the prior exam. Previously seen vascular congestion has improved significantly when compare with the prior exam. There are some areas of increased density in the right suprahilar region consistent with prior radiation therapy and scarring. This is stable from multiple previous CTs dating back to at least 2020. No focal infiltrate or effusion is noted. IMPRESSION: Resolution of previously seen right-sided pleural effusion. Post radiation changes in the right suprahilar region. No acute abnormality noted. Electronically Signed   By: Inez Catalina M.D.   On: 10/14/2020 23:31   ECHOCARDIOGRAM COMPLETE  Result Date: 11/08/2020    ECHOCARDIOGRAM REPORT   Patient Name:   Jody [name] Date of Exam: 11/08/2020 Medical Rec #:  [id number]      Height:       59.0 in Accession #:    [id number]     Weight:       114.6 lb Date of Birth:  07/26/1940       BSA:          1.456 m Patient Age:    67 years       BP:           134/77 mmHg Patient Gender: F              HR:           115 bpm. Exam Location:  [facility] Procedure: 2D Echo Indications:     Syncope  History:         Patient has prior history of Echocardiogram examinations. CHF,                  Arrythmias:Atrial Fibrillation; Risk Factors:Hypertension.  Sonographer:     L Thornton-Maynard Referring Phys:  [id number] AGBATA Diagnosing Phys: Bartholome Bill MD IMPRESSIONS  1. Left ventricular ejection fraction, by estimation, is 60 to 65%. The left ventricle has normal function. The left ventricle has no regional wall motion abnormalities. There is mild left ventricular hypertrophy. Left ventricular diastolic parameters are consistent with Grade I diastolic dysfunction (impaired relaxation).  2. Right ventricular systolic function is normal. The right ventricular size  is normal. There is normal pulmonary artery systolic pressure.  3. Left atrial size was mild to moderately dilated.  4. Right atrial size was mild to moderately dilated.  5.  The mitral valve is degenerative. Mild to moderate mitral valve regurgitation.  6. The aortic valve was not well visualized. Aortic valve regurgitation is mild. FINDINGS  Left Ventricle: Left ventricular ejection fraction, by estimation, is 60 to 65%. The left ventricle has normal function. The left ventricle has no regional wall motion abnormalities. The left ventricular internal cavity size was normal in size. There is  mild left ventricular hypertrophy. Left ventricular diastolic parameters are consistent with Grade I diastolic dysfunction (impaired relaxation). Right Ventricle: The right ventricular size is normal. No increase in right ventricular wall thickness. Right ventricular systolic function is normal. There is normal pulmonary artery systolic pressure. The tricuspid regurgitant velocity is 2.66 m/s, and  with an assumed right atrial pressure of 3 mmHg, the estimated right ventricular systolic pressure is 94.8 mmHg. Left Atrium: Left atrial size was mild to moderately dilated. Right Atrium: Right atrial size was mild to moderately dilated. Pericardium: There is no evidence of pericardial effusion. Mitral Valve: The mitral valve is degenerative in appearance. Mild to moderate mitral valve regurgitation. Tricuspid Valve: The tricuspid valve is grossly normal. Tricuspid valve regurgitation is mild. Aortic Valve: The aortic valve was not well visualized. Aortic valve regurgitation is mild. Aortic regurgitation PHT measures 636 msec. Aortic valve mean gradient measures 3.0 mmHg. Aortic valve peak gradient measures 5.1 mmHg. Aortic valve area, by VTI measures 1.62 cm. Pulmonic Valve: The pulmonic valve was not well visualized. Pulmonic valve regurgitation is trivial. Aorta: The aortic root is normal in size and structure. IAS/Shunts: The atrial septum is grossly normal.  LEFT VENTRICLE PLAX 2D LVIDd:         4.70 cm     Diastology LVIDs:         3.17 cm     LV e' medial:    6.31 cm/s LV PW:         1.00 cm      LV E/e' medial:  16.8 LV IVS:        1.14 cm     LV e' lateral:   7.83 cm/s LVOT diam:     1.90 cm     LV E/e' lateral: 13.5 LV SV:         29 LV SV Index:   20 LVOT Area:     2.84 cm  LV Volumes (MOD) LV vol d, MOD A2C: 69.7 ml LV vol d, MOD A4C: 41.7 ml LV vol s, MOD A2C: 26.1 ml LV vol s, MOD A4C: 21.6 ml LV SV MOD A2C:     43.6 ml LV SV MOD A4C:     41.7 ml LV SV MOD BP:      33.9 ml RIGHT VENTRICLE RV S prime:     11.00 cm/s TAPSE (M-mode): 1.7 cm LEFT ATRIUM              Index       RIGHT ATRIUM           Index LA diam:        4.90 cm  3.37 cm/m  RA Area:     24.50 cm LA Vol (A2C):   78.3 ml  53.78 ml/m RA Volume:   65.90 ml  45.26 ml/m LA Vol (A4C):   102.0 ml 70.06 ml/m LA Biplane Vol: 94.6 ml  64.98  ml/m  AORTIC VALVE                   PULMONIC VALVE AV Area (Vmax):    1.72 cm    PV Vmax:       1.09 m/s AV Area (Vmean):   1.64 cm    PV Peak grad:  4.8 mmHg AV Area (VTI):     1.62 cm AV Vmax:           113.00 cm/s AV Vmean:          86.700 cm/s AV VTI:            0.179 m AV Peak Grad:      5.1 mmHg AV Mean Grad:      3.0 mmHg LVOT Vmax:         68.50 cm/s LVOT Vmean:        50.200 cm/s LVOT VTI:          0.102 m LVOT/AV VTI ratio: 0.57 AI PHT:            636 msec  AORTA Ao Root diam: 2.80 cm MITRAL VALVE                TRICUSPID VALVE MV Area (PHT): 5.84 cm     TR Peak grad:   28.3 mmHg MV Decel Time: 130 msec     TR Vmax:        266.00 cm/s MV E velocity: 106.00 cm/s                             SHUNTS                             Systemic VTI:  0.10 m                             Systemic Diam: 1.90 cm Bartholome Bill MD Electronically signed by Bartholome Bill MD Signature Date/Time: 11/08/2020/8:41:29 AM    Final     Wt Readings from Last 3 Encounters:  11/07/20 52 kg  10/18/20 52.6 kg  09/24/20 57.6 kg    EKG: A. fib with variable ventricular response  Physical Exam:  Blood pressure 133/76, pulse (!) 37, temperature 97.9 F (36.6 C), resp. rate (!) 21, height 4\' 11"  (1.499 m), weight 52 kg,  SpO2 100 %. Body mass index is 23.15 kg/m. General: Well developed, well nourished, in no acute distress. Head: Normocephalic, atraumatic, sclera non-icteric, no xanthomas, nares are without discharge.  Neck: Negative for carotid bruits. JVD not elevated. Lungs: Clear bilaterally to auscultation without wheezes, rales, or rhonchi. Breathing is unlabored. Heart: Irregular regular Abdomen: Soft, non-tender, non-distended with normoactive bowel sounds. No hepatomegaly. No rebound/guarding. No obvious abdominal masses. Msk:  Strength and tone appear normal for age. Extremities: No clubbing or cyanosis. No edema.  Distal pedal pulses are 2+ and equal bilaterally. Neuro: Responsive but limited historian. Psych:  Responds to questions appropriately with a normal affect.     Assessment and Plan   80 y.o. female with history of atrial fibrillation, CHF who was sent to the emergency room after a visit to her primary care's office.  After the visit she felt nauseated and apparently felt syncopal.  Apparently did not fall.  Stated she was feeling better although history was limited due to memory issues.  Was  admitted May 14 for atrial fibrillation pneumonia and syncope.  She had a history of an attempted cardioversion on April 2022.  She is treated with amiodarone at 200 mg , apixaban 2.5 mg twice daily, furosemide 20 mg PRN, metoprolol succinate 50 mg twice daily.  Echo 7/21 revealed EF 40-45% with moderate mr and tr. EKG on admission showed atrial fibrillation with variable fairly well controlled  ventricular response.  Chest CT without contrast showed radiation fibrosis in the right upper lobe along with a calcified splenic mass which was stable and no stable hiatal hernia.  Chest x-ray showed no active cardiopulmonary disease.  Right lower lobe infiltrate seen on CT 3 weeks previously was resolved.  Empiric troponins were unremarkable.  Urinalysis showed moderate leukocytosis nitrate free.  Greater than 50  white cells.  Patient had a elevation in troponin from her baseline of approximately 1 to 1.25.  White count was 17.4.  She was placed on antibiotics empirically.  She was continued with amiodarone 200 mg daily digoxin 0.125 mg daily furosemide 20 mg as needed metoprolol succinate 50 mg daily.  1.  Near syncope-was admitted approximately a month ago with similar complaints although had frank syncope at that point.  Work-up was unremarkable.  She had had her beta-blocker held variably due to hypotension during that admission.  She was placed on digoxin due to difficult to control A. fib at that time.  Echo done a year ago showed good LV function. Echo during this hospitalization if pending. Etiology of event is unclear. No obvious pauses or sustained rapid vr so far that would explain event. Pt is a very difficult historian but husband was with her during this event and with the previous events all of which were similar with no frank syncope but pateint becomes difficult to wake up transiently. Will continue wit current regimen and follow on telemetry. Will review echo when avaiable.     Signed, Teodoro Spray MD 11/08/2020, 10:26 AM Pager: [phone]

## 2020-11-09 DIAGNOSIS — R55 Syncope and collapse: Secondary | ICD-10-CM | POA: Diagnosis not present

## 2020-11-09 LAB — CBC
HCT: 32.8 % — ABNORMAL LOW (ref 36.0–46.0)
Hemoglobin: 11.6 g/dL — ABNORMAL LOW (ref 12.0–15.0)
MCH: 29 pg (ref 26.0–34.0)
MCHC: 35.4 g/dL (ref 30.0–36.0)
MCV: 82 fL (ref 80.0–100.0)
Platelets: 276 10*3/uL (ref 150–400)
RBC: 4 MIL/uL (ref 3.87–5.11)
RDW: 16 % — ABNORMAL HIGH (ref 11.5–15.5)
WBC: 10.2 10*3/uL (ref 4.0–10.5)
nRBC: 0 % (ref 0.0–0.2)

## 2020-11-09 LAB — URINE CULTURE: Culture: NO GROWTH

## 2020-11-09 LAB — BASIC METABOLIC PANEL
Anion gap: 9 (ref 5–15)
BUN: 16 mg/dL (ref 8–23)
CO2: 25 mmol/L (ref 22–32)
Calcium: 9 mg/dL (ref 8.9–10.3)
Chloride: 99 mmol/L (ref 98–111)
Creatinine, Ser: 0.79 mg/dL (ref 0.44–1.00)
GFR, Estimated: >60 mL/min (ref >60)
Glucose, Bld: 93 mg/dL (ref 70–99)
Potassium: 3.1 mmol/L — ABNORMAL LOW (ref 3.5–5.1)
Sodium: 133 mmol/L — ABNORMAL LOW (ref 135–145)

## 2020-11-09 LAB — GLUCOSE, CAPILLARY: Glucose-Capillary: 100 mg/dL — ABNORMAL HIGH (ref 70–99)

## 2020-11-09 MED ORDER — POTASSIUM CHLORIDE CRYS ER 20 MEQ PO TBCR
40.0000 meq | EXTENDED_RELEASE_TABLET | ORAL | Status: AC
  Administered 2020-11-09 (×2): 40 meq via ORAL
  Filled 2020-11-09 (×2): qty 2

## 2020-11-09 MED ORDER — SODIUM CHLORIDE 0.9 % IV SOLN: INTRAVENOUS | Status: DC

## 2020-11-09 NOTE — TOC Progression Note (Addendum)
Transition of Care [facility]) - Progression Note    Patient Details  Name: Jody [name] MRN: [id number] Date of Birth: 11/17/1940  Transition of Care [facility]) CM/SW Contact  Izola Price, RN Phone Number: 11/09/2020, 9:00 AM  Clinical Narrative:  0900 Attempted to reach out to patient and spouse with no answers to explain MOON document. Will continue to try to connect with either of them. Barbie Allona Gondek RN CM  455 pm. NO call back from VM left with husband. Unable to leave a VM with contact for son. Patient remains in OBS status, but has documented dementia. Simmie Davies RN CM          Expected Discharge Plan and Services                                                 Social Determinants of Health (SDOH) Interventions    Readmission Risk Interventions Readmission Risk Prevention Plan 10/16/2020  Transportation Screening Complete  PCP or Specialist Appt within 3-5 Days Complete  HRI or [place] Complete  Social Work Consult for [place] Planning/Counseling Complete  Palliative Care Screening Not Applicable  Medication Review [employer]) Complete  Some recent data might be hidden

## 2020-11-09 NOTE — Plan of Care (Signed)
  Problem: Clinical Measurements: Goal: Ability to maintain clinical measurements within normal limits will improve Outcome: Progressing   

## 2020-11-09 NOTE — Progress Notes (Signed)
Patient Name: Jody [name] Date of Encounter: 11/09/2020  Hospital Problem List     Principal Problem:   Syncope and collapse Active Problems:   Benign essential HTN   Atrial fibrillation with rapid ventricular response (HCC)   Dementia without behavioral disturbance (HCC)   Acute lower UTI   Hypercalcemia    Patient Profile     80 y.o. female with history of atrial fibrillation, CHF who was sent to the emergency room after a visit to her primary care's office.  After the visit she felt nauseated and apparently felt syncopal.  Apparently did not fall.  Stated she was feeling better although history was limited due to memory issues.  Was admitted May 14 for atrial fibrillation pneumonia and syncope.  She had a history of an attempted cardioversion on April 2022.  She is treated with amiodarone at 200 mg , apixaban 2.5 mg twice daily, furosemide 20 mg PRN, metoprolol succinate 50 mg twice daily.  Echo 7/21 revealed EF 40-45% with moderate mr and tr. EKG on admission showed atrial fibrillation with variable fairly well controlled  ventricular response.  Chest CT without contrast showed radiation fibrosis in the right upper lobe along with a calcified splenic mass which was stable and no stable hiatal hernia.  Chest x-ray showed no active cardiopulmonary disease.  Right lower lobe infiltrate seen on CT 3 weeks previously was resolved.  Empiric troponins were unremarkable.  Urinalysis showed moderate leukocytosis nitrate free.  Greater than 50 white cells.  Patient had a elevation in troponin from her baseline of approximately 1 to 1.25.  White count was 17.4.  She was placed on antibiotics empirically.  She was continued with amiodarone 200 mg daily digoxin 0.125 mg daily furosemide 20 mg as needed metoprolol succinate 50 mg daily.   Subjective   Limited historian  Inpatient Medications    . amiodarone  200 mg Oral Daily  . apixaban  2.5 mg Oral BID  . digoxin  0.125 mg Oral Daily  .  FLUoxetine  40 mg Oral Daily  . magnesium oxide  400 mg Oral Daily  . megestrol  625 mg Oral Daily  . memantine  5 mg Oral BID  . metoprolol succinate  50 mg Oral Daily  . multivitamin with minerals  1 tablet Oral Daily  . pantoprazole  40 mg Oral Daily  . potassium chloride  40 mEq Oral Q4H  . sodium chloride flush  3 mL Intravenous Q12H    Vital Signs    Vitals:   11/09/20 0452 11/09/20 0500 11/09/20 0805 11/09/20 1159  BP: 133/68  (!) 142/69 122/68  Pulse: 64  (!) 55 83  Resp: 18  16 18   Temp:   98 F (36.7 C) 98.3 F (36.8 C)  TempSrc:   Oral Oral  SpO2: 96%  97% 97%  Weight:  50.5 kg    Height:        Intake/Output Summary (Last 24 hours) at 11/09/2020 1422 Last data filed at 11/09/2020 1340 Gross per 24 hour  Intake 340 ml  Output 450 ml  Net -110 ml   Filed Weights   11/07/20 1211 11/08/20 1154 11/09/20 0500  Weight: 52 kg 51.8 kg 50.5 kg    Physical Exam    GEN: Well nourished, well developed, in no acute distress.  HEENT: normal.  Neck: Supple, no JVD, carotid bruits, or masses. Cardiac irregular irregular rhythm Respiratory:  Respirations regular and unlabored, clear to auscultation bilaterally. GI: Soft, nontender, nondistended,  BS + x 4. MS: no deformity or atrophy. Skin: warm and dry, no rash. Neuro: Responds to questions but inappropriately.  Labs    CBC Recent Labs    11/08/20 1228 11/09/20 0420  WBC 9.7 10.2  HGB 12.3 11.6*  HCT 36.8 32.8*  MCV 86.2 82.0  PLT 265 144   Basic Metabolic Panel Recent Labs    11/08/20 1228 11/09/20 0420  NA 133* 133*  K 4.1 3.1*  CL 98 99  CO2 23 25  GLUCOSE 107* 93  BUN 17 16  CREATININE 0.97 0.79  CALCIUM 9.4 9.0   Liver Function Tests Recent Labs    11/07/20 1211  AST 29  ALT 33  ALKPHOS 73  BILITOT 1.5*  PROT 7.7  ALBUMIN 4.0   No results for input(s): LIPASE, AMYLASE in the last 72 hours. Cardiac Enzymes No results for input(s): CKTOTAL, CKMB, CKMBINDEX, TROPONINI in the last 72  hours. BNP No results for input(s): BNP in the last 72 hours. D-Dimer No results for input(s): DDIMER in the last 72 hours. Hemoglobin A1C No results for input(s): HGBA1C in the last 72 hours. Fasting Lipid Panel No results for input(s): CHOL, HDL, LDLCALC, TRIG, CHOLHDL, LDLDIRECT in the last 72 hours. Thyroid Function Tests No results for input(s): TSH, T4TOTAL, T3FREE, THYROIDAB in the last 72 hours.  Invalid input(s): FREET3  Telemetry    Atrial fibrillation with controlled ventricular response  ECG    Atrial fibrillation  Radiology    CT Head Wo Contrast  Result Date: 10/15/2020 CLINICAL DATA:  Mental status change. Unknown cause. Syncopal episode. Status post ground level fall. EXAM: CT HEAD WITHOUT CONTRAST TECHNIQUE: Contiguous axial images were obtained from the base of the skull through the vertex without intravenous contrast. COMPARISON:  CT head 01/11/2018 FINDINGS: Brain: Cerebral ventricle sizes are concordant with the degree of cerebral volume loss. Patchy and confluent areas of decreased attenuation are noted throughout the deep and periventricular white matter of the cerebral hemispheres bilaterally, compatible with chronic microvascular ischemic disease. No evidence of large-territorial acute infarction. No parenchymal hemorrhage. No mass lesion. No extra-axial collection. No mass effect or midline shift. No hydrocephalus. Basilar cisterns are patent. Vascular: No hyperdense vessel. Atherosclerotic calcifications are present within the cavernous internal carotid arteries. Skull: No acute fracture or focal lesion. Sinuses/Orbits: Paranasal sinuses and mastoid air cells are clear. Bilateral lens replacement. Otherwise orbits are unremarkable. Other: None. IMPRESSION: No acute intracranial abnormality. Electronically Signed   By: Iven Finn M.D.   On: 10/15/2020 00:12   CT Chest Wo Contrast  Result Date: 11/07/2020 CLINICAL DATA:  Pneumonia. EXAM: CT CHEST WITHOUT  CONTRAST TECHNIQUE: Multidetector CT imaging of the chest was performed following the standard protocol without IV contrast. COMPARISON:  Oct 16, 2020. FINDINGS: Cardiovascular: Atherosclerosis of thoracic aorta is noted without aneurysm formation. Normal cardiac size. No pericardial effusion. Coronary artery calcifications are noted. Mediastinum/Nodes: Stable appearance of thyroid gland. Large sliding-type hiatal hernia. No adenopathy is noted. Lungs/Pleura: No pneumothorax or pleural effusion is noted. 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 Patchy consolidation seen in right lower lobe anteriorly on prior exam appears to have resolved. Stable bilateral pulmonary nodules are noted as described on prior exam. Upper Abdomen: Stable large calcified splenic mass is noted. Musculoskeletal: No chest wall mass or suspicious bone lesions identified. IMPRESSION: Patchy consolidation seen in right lower lobe on prior exam appears to have resolved, most consistent with improving inflammation. Stable bilateral pulmonary nodules are noted  as described on prior exam. 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 Stable large calcified splenic mass is noted. Stable hiatal hernia. Coronary artery calcifications are noted. Aortic Atherosclerosis (ICD10-I70.0). Electronically Signed   By: Marijo Conception M.D.   On: 11/07/2020 15:40   CT CHEST WO CONTRAST  Result Date: 10/16/2020 CLINICAL DATA:  Lung nodule, follow-up; history of RIGHT lung cancer post radiation therapy EXAM: CT CHEST WITHOUT CONTRAST TECHNIQUE: Multidetector CT imaging of the chest was performed following the standard protocol without IV contrast. Sagittal and coronal MPR images reconstructed from axial data set. COMPARISON:  05/08/2019, 11/03/2018 FINDINGS: Cardiovascular: Atherosclerotic calcifications aorta, coronary arteries, proximal great vessels. Aorta normal caliber. Heart  unremarkable. No pericardial effusion. Mediastinum/Nodes: Large hiatal hernia. Esophagus unremarkable. Tiny BILATERAL thyroid nodules up to 8 mm diameter; Not clinically significant; no follow-up imaging recommended (ref: J Am Coll Radiol. 2015 Feb;12(2): 143-50).No definite thoracic adenopathy. Slightly prominent RIGHT pulmonary hilum but not grossly changed. Lungs/Pleura: Radiation fibrosis changes again identified RIGHT upper lobe with associated parenchymal calcifications. Changes extend to the RIGHT hilum. 4 mm RIGHT upper lobe nodule image 40 unchanged. 6 x 4 mm RIGHT upper lobe nodule image 39, measured 4 x 4 mm on the previous exam but 6 x 4 mm on the earlier study of 11/03/2018. RIGHT upper lobe nodule 7 x 4 mm image 46 unchanged. Additional scattered nodular foci and calcified granulomata in both lungs including a 6 mm LEFT upper lobe nodule image 39 unchanged. New patchy area of consolidation in RIGHT lower lobe laterally consistent with pneumonia. No pleural effusion or pneumothorax. No definite new or additional enlarging nodules seen. Upper Abdomen: 7.0 cm diameter partially calcified splenic mass with globular enlargement of spleen unchanged. Post cholecystectomy. Atherosclerotic calcifications aorta and SMA. Musculoskeletal: Osseous demineralization. IMPRESSION: Radiation fibrosis changes RIGHT upper lobe. Stable BILATERAL pulmonary nodules. New patchy area of consolidation in RIGHT lower lobe laterally consistent with pneumonia. Large hiatal hernia. 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 Aortic Atherosclerosis (ICD10-I70.0). Electronically Signed   By: Lavonia Dana M.D.   On: 10/16/2020 13:03   DG Chest Port 1 View  Result Date: 11/07/2020 CLINICAL DATA:  Nausea with syncopal episode. EXAM: PORTABLE CHEST 1 VIEW COMPARISON:  Radiographs 10/14/2020.  CT 10/16/2020. FINDINGS: 1211 hours. The heart size and mediastinal contours are stable with mild cardiomegaly and aortic  atherosclerosis. Radiation changes in the right perihilar region and associated right upper lobe volume loss are stable. The patchy airspace opacity seen in the right lower lobe on the recent CT is no longer visualized. The left lung appears clear. There is no pleural effusion or pneumothorax. Small hiatal hernia and calcified splenic mass are noted. IMPRESSION: No evidence of active cardiopulmonary process. Right lower lobe infiltrate seen on CT 3 weeks ago appears resolved. Electronically Signed   By: Richardean Sale M.D.   On: 11/07/2020 12:41   DG Chest Port 1 View  Result Date: 10/14/2020 CLINICAL DATA:  Recent syncopal episode EXAM: PORTABLE CHEST 1 VIEW COMPARISON:  08/29/2020 FINDINGS: Cardiac shadow is at the upper limits of normal in size. Aortic calcifications are noted. Previously seen right-sided pleural effusion has resolved in the interval from the prior exam. Previously seen vascular congestion has improved significantly when compare with the prior exam. There are some areas of increased density in the right suprahilar region consistent with prior radiation therapy and scarring. This is stable from multiple previous CTs dating back to at least 2020. No focal infiltrate  or effusion is noted. IMPRESSION: Resolution of previously seen right-sided pleural effusion. Post radiation changes in the right suprahilar region. No acute abnormality noted. Electronically Signed   By: Inez Catalina M.D.   On: 10/14/2020 23:31   ECHOCARDIOGRAM COMPLETE  Result Date: 11/08/2020    ECHOCARDIOGRAM REPORT   Patient Name:   KIMIAH HIBNER Date of Exam: 11/08/2020 Medical Rec #:  [id number]      Height:       59.0 in Accession #:    [id number]     Weight:       114.6 lb Date of Birth:  Jan 14, 1941       BSA:          1.456 m Patient Age:    45 years       BP:           134/77 mmHg Patient Gender: F              HR:           115 bpm. Exam Location:  [facility] Procedure: 2D Echo Indications:     Syncope  History:          Patient has prior history of Echocardiogram examinations. CHF,                  Arrythmias:Atrial Fibrillation; Risk Factors:Hypertension.  Sonographer:     L Thornton-Maynard Referring Phys:  [id number] AGBATA Diagnosing Phys: Bartholome Bill MD IMPRESSIONS  1. Left ventricular ejection fraction, by estimation, is 60 to 65%. The left ventricle has normal function. The left ventricle has no regional wall motion abnormalities. There is mild left ventricular hypertrophy. Left ventricular diastolic parameters are consistent with Grade I diastolic dysfunction (impaired relaxation).  2. Right ventricular systolic function is normal. The right ventricular size is normal. There is normal pulmonary artery systolic pressure.  3. Left atrial size was mild to moderately dilated.  4. Right atrial size was mild to moderately dilated.  5. The mitral valve is degenerative. Mild to moderate mitral valve regurgitation.  6. The aortic valve was not well visualized. Aortic valve regurgitation is mild. FINDINGS  Left Ventricle: Left ventricular ejection fraction, by estimation, is 60 to 65%. The left ventricle has normal function. The left ventricle has no regional wall motion abnormalities. The left ventricular internal cavity size was normal in size. There is  mild left ventricular hypertrophy. Left ventricular diastolic parameters are consistent with Grade I diastolic dysfunction (impaired relaxation). Right Ventricle: The right ventricular size is normal. No increase in right ventricular wall thickness. Right ventricular systolic function is normal. There is normal pulmonary artery systolic pressure. The tricuspid regurgitant velocity is 2.66 m/s, and  with an assumed right atrial pressure of 3 mmHg, the estimated right ventricular systolic pressure is 93.7 mmHg. Left Atrium: Left atrial size was mild to moderately dilated. Right Atrium: Right atrial size was mild to moderately dilated. Pericardium: There is no evidence of  pericardial effusion. Mitral Valve: The mitral valve is degenerative in appearance. Mild to moderate mitral valve regurgitation. Tricuspid Valve: The tricuspid valve is grossly normal. Tricuspid valve regurgitation is mild. Aortic Valve: The aortic valve was not well visualized. Aortic valve regurgitation is mild. Aortic regurgitation PHT measures 636 msec. Aortic valve mean gradient measures 3.0 mmHg. Aortic valve peak gradient measures 5.1 mmHg. Aortic valve area, by VTI measures 1.62 cm. Pulmonic Valve: The pulmonic valve was not well visualized. Pulmonic valve regurgitation is trivial. Aorta: The aortic root is normal  in size and structure. IAS/Shunts: The atrial septum is grossly normal.  LEFT VENTRICLE PLAX 2D LVIDd:         4.70 cm     Diastology LVIDs:         3.17 cm     LV e' medial:    6.31 cm/s LV PW:         1.00 cm     LV E/e' medial:  16.8 LV IVS:        1.14 cm     LV e' lateral:   7.83 cm/s LVOT diam:     1.90 cm     LV E/e' lateral: 13.5 LV SV:         29 LV SV Index:   20 LVOT Area:     2.84 cm  LV Volumes (MOD) LV vol d, MOD A2C: 69.7 ml LV vol d, MOD A4C: 41.7 ml LV vol s, MOD A2C: 26.1 ml LV vol s, MOD A4C: 21.6 ml LV SV MOD A2C:     43.6 ml LV SV MOD A4C:     41.7 ml LV SV MOD BP:      33.9 ml RIGHT VENTRICLE RV S prime:     11.00 cm/s TAPSE (M-mode): 1.7 cm LEFT ATRIUM              Index       RIGHT ATRIUM           Index LA diam:        4.90 cm  3.37 cm/m  RA Area:     24.50 cm LA Vol (A2C):   78.3 ml  53.78 ml/m RA Volume:   65.90 ml  45.26 ml/m LA Vol (A4C):   102.0 ml 70.06 ml/m LA Biplane Vol: 94.6 ml  64.98 ml/m  AORTIC VALVE                   PULMONIC VALVE AV Area (Vmax):    1.72 cm    PV Vmax:       1.09 m/s AV Area (Vmean):   1.64 cm    PV Peak grad:  4.8 mmHg AV Area (VTI):     1.62 cm AV Vmax:           113.00 cm/s AV Vmean:          86.700 cm/s AV VTI:            0.179 m AV Peak Grad:      5.1 mmHg AV Mean Grad:      3.0 mmHg LVOT Vmax:         68.50 cm/s LVOT Vmean:         50.200 cm/s LVOT VTI:          0.102 m LVOT/AV VTI ratio: 0.57 AI PHT:            636 msec  AORTA Ao Root diam: 2.80 cm MITRAL VALVE                TRICUSPID VALVE MV Area (PHT): 5.84 cm     TR Peak grad:   28.3 mmHg MV Decel Time: 130 msec     TR Vmax:        266.00 cm/s MV E velocity: 106.00 cm/s                             SHUNTS  Systemic VTI:  0.10 m                             Systemic Diam: 1.90 cm Bartholome Bill MD Electronically signed by Bartholome Bill MD Signature Date/Time: 11/08/2020/8:41:29 AM    Final     Assessment & Plan    80 y.o. female with history of atrial fibrillation, CHF who was sent to the emergency room after a visit to her primary care's office.  After the visit she felt nauseated and apparently felt syncopal.  Apparently did not fall.  Stated she was feeling better although history was limited due to memory issues.  Was admitted May 14 for atrial fibrillation pneumonia and syncope.  She had a history of an attempted cardioversion on April 2022.  She is treated with amiodarone at 200 mg , apixaban 2.5 mg twice daily, furosemide 20 mg PRN, metoprolol succinate 50 mg twice daily.  Echo 7/21 revealed EF 40-45% with moderate mr and tr. EKG on admission showed atrial fibrillation with variable fairly well controlled  ventricular response.  Chest CT without contrast showed radiation fibrosis in the right upper lobe along with a calcified splenic mass which was stable and no stable hiatal hernia.  Chest x-ray showed no active cardiopulmonary disease.  Right lower lobe infiltrate seen on CT 3 weeks previously was resolved.  Empiric troponins were unremarkable.  Urinalysis showed moderate leukocytosis nitrate free.  Greater than 50 white cells.  Patient had a elevation in troponin from her baseline of approximately 1 to 1.25.  White count was 17.4.  She was placed on antibiotics empirically.  She was continued with amiodarone 200 mg daily digoxin 0.125 mg daily  furosemide 20 mg as needed metoprolol succinate 50 mg daily.  1.  Near syncope-was admitted approximately a month ago with similar complaints although had frank syncope at that point.  Work-up was unremarkable.  She had had her beta-blocker held variably due to hypotension during that admission.  She was placed on digoxin due to difficult to control A. fib at that time.  Echo done a year ago showed good LV function. Echo during this hospitalization if pending. Etiology of event is unclear. No obvious pauses or sustained rapid vr so far that would explain event. Pt is a very difficult historian but husband was with her during this event and with the previous events all of which were similar with no frank syncope but pateint becomes difficult to wake up transiently. Will continue wit current regimen and follow on telemetry.  No significant arrhythmia noted.  Echo showed preserved LV function.  No significant valvular abnormalities.  No obvious arrhythmia or structural abnormality that would explain her syncope.  Does not appear to require any further work-up.  We will continue to follow.     Signed, Javier Docker Gelisa Tieken MD 11/09/2020, 2:22 PM  Pager: [phone]

## 2020-11-09 NOTE — Progress Notes (Signed)
PROGRESS NOTE  Jody [name] ZOX:[id number] DOB: Nov 11, 1940 DOA: 11/07/2020 PCP: Baxter Hire, MD  HPI/Recap of past 24 hours: Patient seen and examined at bedside in the emergency department. Patient does not remember what happened.  She denies any headache.   Update:11/09/2020  Seen and examined   Assessment/Plan: Principal Problem:   Syncope and collapse Active Problems:   Benign essential HTN   Atrial fibrillation with rapid ventricular response (HCC)   Dementia without behavioral disturbance (HCC)   Acute lower UTI   Hypercalcemia   1. recurrent syncope .continue IV fluids; cardiology seen. No clear etiology .echo did not show any significant abnormality.  Of note is that she has had heart rates that are as low as 37.  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  I will obtain physical therapy evaluation.  I have also started on IV rehydration  2.  Atrial fibrillation.  Rate is well controlled we will continue metoprolol digoxin and amiodarone continue apixaban  3.  Urinary tract infection with leukocytosis.  Her white count is normalized.  Continue ceftriaxone  4.  Dementia.  Patient is on Namenda and alprazolam as well as fluoxetine.  She did not remember what happened or why she was here.  5.  Hypokalemia will replace and recheck in the morning.  Follow chemistry result in the morning   Code Status: Full  Severity of Illness: The appropriate patient status for this patient is OBSERVATION. Observation status is judged to be reasonable and necessary in order to provide the required intensity of service to ensure the patient's safety. The patient's presenting symptoms, physical exam findings, and initial radiographic and laboratory data in the context of their medical condition is felt to place them at decreased risk for further clinical deterioration. Furthermore, it is anticipated that the patient will be medically stable for discharge from the hospital within  2 midnights of admission. The following factors support the patient status of observation.   " The patient's presenting symptoms include syncope " Patient with also some dementia and recurrence of syncope and urinary tract infection which could also have increased her altered mentation causing her to pass out    Family Communication: Spoke to her son Elta Guadeloupe at bedside he is concerned about the recurrence of this episode of passing out and wanted to make sure she was stable before discharge so that he will not turn around and bring her right back.  Disposition Plan: Home when stable possible discharge in the morning after physical therapy evaluation and recommendation   Consultants:  None  Procedures:  None  Antimicrobials:  Ceftriaxone  DVT prophylaxis: Apixaban   Objective: Vitals:   11/08/20 2038 11/09/20 0452 11/09/20 0500 11/09/20 0805  BP: 114/81 133/68  (!) 142/69  Pulse: 74 64  (!) 55  Resp: 17 18  16   Temp: 97.7 F (36.5 C)   98 F (36.7 C)  TempSrc: Oral   Oral  SpO2: 97% 96%  97%  Weight:   50.5 kg   Height:        Intake/Output Summary (Last 24 hours) at 11/09/2020 0932 Last data filed at 11/09/2020 0500 Gross per 24 hour  Intake 100 ml  Output 450 ml  Net -350 ml   Filed Weights   11/07/20 1211 11/08/20 1154 11/09/20 0500  Weight: 52 kg 51.8 kg 50.5 kg   Body mass index is 22.49 kg/m.  Exam:  . General: 80 y.o. year-old female well developed well nourished in no  acute distress.  Alert and oriented x2 . Cardiovascular: Regular rate and rhythm with no rubs or gallops.  No thyromegaly or JVD noted.   Marland Kitchen Respiratory: Clear to auscultation with no wheezes or rales. Good inspiratory effort. . Abdomen: Soft nontender nondistended with normal bowel sounds x4 quadrants. . Musculoskeletal: No lower extremity edema. 2/4 pulses in all 4 extremities. . Skin: No ulcerative lesions noted or rashes, . Psychiatry: Mood is appropriate for condition and  setting    Data Reviewed: CBC: Recent Labs  Lab 11/07/20 1211 11/08/20 1228 11/09/20 0420  WBC 17.4* 9.7 10.2  HGB 14.2 12.3 11.6*  HCT 40.8 36.8 32.8*  MCV 82.9 86.2 82.0  PLT 380 265 585   Basic Metabolic Panel: Recent Labs  Lab 11/07/20 1211 11/08/20 1228 11/09/20 0420  NA 136 133* 133*  K 4.1 4.1 3.1*  CL 93* 98 99  CO2 26 23 25   GLUCOSE 137* 107* 93  BUN 24* 17 16  CREATININE 1.25* 0.97 0.79  CALCIUM 10.4* 9.4 9.0   GFR: Estimated Creatinine Clearance: 37 mL/min (by C-G formula based on SCr of 0.79 mg/dL). Liver Function Tests: Recent Labs  Lab 11/07/20 1211  AST 29  ALT 33  ALKPHOS 73  BILITOT 1.5*  PROT 7.7  ALBUMIN 4.0   No results for input(s): LIPASE, AMYLASE in the last 168 hours. No results for input(s): AMMONIA in the last 168 hours. Coagulation Profile: No results for input(s): INR, PROTIME in the last 168 hours. Cardiac Enzymes: No results for input(s): CKTOTAL, CKMB, CKMBINDEX, TROPONINI in the last 168 hours. BNP (last 3 results) No results for input(s): PROBNP in the last 8760 hours. HbA1C: No results for input(s): HGBA1C in the last 72 hours. CBG: Recent Labs  Lab 11/09/20 0458  GLUCAP 100*   Lipid Profile: No results for input(s): CHOL, HDL, LDLCALC, TRIG, CHOLHDL, LDLDIRECT in the last 72 hours. Thyroid Function Tests: No results for input(s): TSH, T4TOTAL, FREET4, T3FREE, THYROIDAB in the last 72 hours. Anemia Panel: No results for input(s): VITAMINB12, FOLATE, FERRITIN, TIBC, IRON, RETICCTPCT in the last 72 hours. Urine analysis:    Component Value Date/Time   COLORURINE YELLOW (A) 11/07/2020 1217   APPEARANCEUR CLOUDY (A) 11/07/2020 1217   LABSPEC 1.009 11/07/2020 1217   PHURINE 7.0 11/07/2020 1217   GLUCOSEU NEGATIVE 11/07/2020 1217   HGBUR NEGATIVE 11/07/2020 1217   BILIRUBINUR NEGATIVE 11/07/2020 1217   KETONESUR NEGATIVE 11/07/2020 1217   PROTEINUR NEGATIVE 11/07/2020 1217   NITRITE NEGATIVE 11/07/2020 1217    LEUKOCYTESUR MODERATE (A) 11/07/2020 1217   Sepsis Labs: @LABRCNTIP (procalcitonin:4,lacticidven:4)  ) Recent Results (from the past 240 hour(s))  Resp Panel by RT-PCR (Flu A&B, Covid) Nasopharyngeal Swab     Status: None   Collection Time: 11/07/20 12:57 PM   Specimen: Nasopharyngeal Swab; Nasopharyngeal(NP) swabs in vial transport medium  Result Value Ref Range Status   SARS Coronavirus 2 by RT PCR NEGATIVE NEGATIVE Final    Comment: (NOTE) SARS-CoV-2 target nucleic acids are NOT DETECTED.  The SARS-CoV-2 RNA is generally detectable in upper respiratory specimens during the acute phase of infection. The lowest concentration of SARS-CoV-2 viral copies this assay can detect is 138 copies/mL. A negative result does not preclude SARS-Cov-2 infection and should not be used as the sole basis for treatment or other patient management decisions. A negative result may occur with  improper specimen collection/handling, submission of specimen other than nasopharyngeal swab, presence of viral mutation(s) within the areas targeted by this assay, and inadequate number  of viral copies(<138 copies/mL). A negative result must be combined with clinical observations, patient history, and epidemiological information. The expected result is Negative.  Fact Sheet for Patients:  EntrepreneurPulse.com.au  Fact Sheet for Healthcare Providers:  IncredibleEmployment.be  This test is no t yet approved or cleared by the [place] FDA and  has been authorized for detection and/or diagnosis of SARS-CoV-2 by FDA under an Emergency Use Authorization (EUA). This EUA will remain  in effect (meaning this test can be used) for the duration of the COVID-19 declaration under Section 564(b)(1) of the Act, 21 U.S.C.section 360bbb-3(b)(1), unless the authorization is terminated  or revoked sooner.       Influenza A by PCR NEGATIVE NEGATIVE Final   Influenza B by PCR  NEGATIVE NEGATIVE Final    Comment: (NOTE) The Xpert Xpress SARS-CoV-2/FLU/RSV plus assay is intended as an aid in the diagnosis of influenza from Nasopharyngeal swab specimens and should not be used as a sole basis for treatment. Nasal washings and aspirates are unacceptable for Xpert Xpress SARS-CoV-2/FLU/RSV testing.  Fact Sheet for Patients: EntrepreneurPulse.com.au  Fact Sheet for Healthcare Providers: IncredibleEmployment.be  This test is not yet approved or cleared by the [place] FDA and has been authorized for detection and/or diagnosis of SARS-CoV-2 by FDA under an Emergency Use Authorization (EUA). This EUA will remain in effect (meaning this test can be used) for the duration of the COVID-19 declaration under Section 564(b)(1) of the Act, 21 U.S.C. section 360bbb-3(b)(1), unless the authorization is terminated or revoked.  Performed at [facility], [address], [place], [place] [address]       Studies: No results found.  Scheduled Meds: . amiodarone  200 mg Oral Daily  . apixaban  2.5 mg Oral BID  . digoxin  0.125 mg Oral Daily  . FLUoxetine  40 mg Oral Daily  . magnesium oxide  400 mg Oral Daily  . megestrol  625 mg Oral Daily  . memantine  5 mg Oral BID  . metoprolol succinate  50 mg Oral Daily  . multivitamin with minerals  1 tablet Oral Daily  . pantoprazole  40 mg Oral Daily  . sodium chloride flush  3 mL Intravenous Q12H    Continuous Infusions: . cefTRIAXone (ROCEPHIN)  IV Stopped (11/08/20 1816)     LOS: 0 days     Cristal Deer, MD [facility]  To reach me or the doctor on call, go to: [email or web address] Password [email or web address]  11/09/2020, 9:32 AM

## 2020-11-10 DIAGNOSIS — I4891 Unspecified atrial fibrillation: Secondary | ICD-10-CM

## 2020-11-10 DIAGNOSIS — R55 Syncope and collapse: Secondary | ICD-10-CM | POA: Diagnosis not present

## 2020-11-10 LAB — BASIC METABOLIC PANEL
Anion gap: 7 (ref 5–15)
Anion gap: 8 (ref 5–15)
BUN: 17 mg/dL (ref 8–23)
BUN: 15 mg/dL (ref 8–23)
CO2: 23 mmol/L (ref 22–32)
CO2: 21 mmol/L — ABNORMAL LOW (ref 22–32)
Calcium: 8.5 mg/dL — ABNORMAL LOW (ref 8.9–10.3)
Calcium: 8.7 mg/dL — ABNORMAL LOW (ref 8.9–10.3)
Chloride: 102 mmol/L (ref 98–111)
Chloride: 100 mmol/L (ref 98–111)
Creatinine, Ser: 0.99 mg/dL (ref 0.44–1.00)
Creatinine, Ser: 1.01 mg/dL — ABNORMAL HIGH (ref 0.44–1.00)
GFR, Estimated: 57 mL/min — ABNORMAL LOW (ref >60)
GFR, Estimated: 56 mL/min — ABNORMAL LOW (ref >60)
Glucose, Bld: 90 mg/dL (ref 70–99)
Glucose, Bld: 131 mg/dL — ABNORMAL HIGH (ref 70–99)
Potassium: 4.1 mmol/L (ref 3.5–5.1)
Potassium: 4.2 mmol/L (ref 3.5–5.1)
Sodium: 132 mmol/L — ABNORMAL LOW (ref 135–145)
Sodium: 129 mmol/L — ABNORMAL LOW (ref 135–145)

## 2020-11-10 LAB — CBC WITH DIFFERENTIAL/PLATELET
Abs Immature Granulocytes: 0.08 10*3/uL — ABNORMAL HIGH (ref 0.00–0.07)
Basophils Absolute: 0.1 10*3/uL (ref 0.0–0.1)
Basophils Relative: 1 %
Eosinophils Absolute: 0.1 10*3/uL (ref 0.0–0.5)
Eosinophils Relative: 1 %
HCT: 36.4 % (ref 36.0–46.0)
Hemoglobin: 12.3 g/dL (ref 12.0–15.0)
Immature Granulocytes: 1 %
Lymphocytes Relative: 7 %
Lymphs Abs: 0.8 10*3/uL (ref 0.7–4.0)
MCH: 28.7 pg (ref 26.0–34.0)
MCHC: 33.8 g/dL (ref 30.0–36.0)
MCV: 85 fL (ref 80.0–100.0)
Monocytes Absolute: 0.7 10*3/uL (ref 0.1–1.0)
Monocytes Relative: 7 %
Neutro Abs: 9.2 10*3/uL — ABNORMAL HIGH (ref 1.7–7.7)
Neutrophils Relative %: 83 %
Platelets: 311 10*3/uL (ref 150–400)
RBC: 4.28 MIL/uL (ref 3.87–5.11)
RDW: 16.4 % — ABNORMAL HIGH (ref 11.5–15.5)
WBC: 11 10*3/uL — ABNORMAL HIGH (ref 4.0–10.5)
nRBC: 0 % (ref 0.0–0.2)

## 2020-11-10 LAB — GLUCOSE, CAPILLARY: Glucose-Capillary: 78 mg/dL (ref 70–99)

## 2020-11-10 NOTE — Plan of Care (Signed)
°  Problem: Coping: °Goal: Level of anxiety will decrease °Outcome: Progressing °  °

## 2020-11-10 NOTE — Progress Notes (Signed)
[facility]  PROGRESS NOTE  Jody [name] OBS:[id number] DOB: 1940/11/14 DOA: 11/07/2020 PCP: Baxter Hire, MD   Brief HPI:   80 year old female with history of asthma, atrial fibrillation, CHF, GERD, hypertension presented to ED for evaluation of syncope.  Patient was going to doctor's appointment on the way home she started vomiting.  Caregiver took her to shower to wash her up and patient went limp and unresponsive.  This was transient and lasted for about 1 minute.  Husband called EMS and she was found to be in A. fib.  Patient has no known history of A. fib in the past.  In the ED, patient was started on IV Rocephin for possible UTI.  She had pyuria.    Subjective   Patient seen and examined, denies any complaints.   Assessment/Plan:     Syncope and collapse -Unclear etiology -Echocardiogram shows grade 1 diastolic dysfunction; EF 60 to 65% -Positive orthostasis -Order TED hose bilaterally -Cardiology following   Atrial fibrillation -Heart rate is controlled -Continue amiodarone,  digoxin, metoprolol -Continue anticoagulation with apixaban   Dementia -Stable, no behavioral disturbance -Continue Namenda   ?  UTI -She had abnormal UA; urine culture showed no growth -IV ceftriaxone has been discontinued     Scheduled medications:   . amiodarone  200 mg Oral Daily  . apixaban  2.5 mg Oral BID  . digoxin  0.125 mg Oral Daily  . FLUoxetine  40 mg Oral Daily  . magnesium oxide  400 mg Oral Daily  . megestrol  625 mg Oral Daily  . memantine  5 mg Oral BID  . metoprolol succinate  50 mg Oral Daily  . multivitamin with minerals  1 tablet Oral Daily  . pantoprazole  40 mg Oral Daily  . sodium chloride flush  3 mL Intravenous Q12H         Data Reviewed:   CBG:  Recent Labs  Lab 11/09/20 0458 11/10/20 0424  GLUCAP 100* 78    SpO2: 99 %    Vitals:   11/10/20 0428 11/10/20 0846 11/10/20 1154 11/10/20 1802  BP:  (!) 151/83 111/83 127/75   Pulse:   61 70  Resp:  17 17 18   Temp:  97.8 F (36.6 C) 97.8 F (36.6 C) 98 F (36.7 C)  TempSrc:      SpO2:   99% 99%  Weight: 52.4 kg     Height:         Intake/Output Summary (Last 24 hours) at 11/10/2020 1926 Last data filed at 11/10/2020 1858 Gross per 24 hour  Intake 947.12 ml  Output 1250 ml  Net -302.88 ml    06/05 0701 - 06/06 1900 In: 1737.1 [P.O.:1120; I.V.:517.1] Out: 1250 [Urine:1250]  Filed Weights   11/08/20 1154 11/09/20 0500 11/10/20 0428  Weight: 51.8 kg 50.5 kg 52.4 kg    CBC:  Recent Labs  Lab 11/07/20 1211 11/08/20 1228 11/09/20 0420  WBC 17.4* 9.7 10.2  HGB 14.2 12.3 11.6*  HCT 40.8 36.8 32.8*  PLT 380 265 276  MCV 82.9 86.2 82.0  MCH 28.9 28.8 29.0  MCHC 34.8 33.4 35.4  RDW 16.1* 16.7* 16.0*    Complete metabolic panel:  Recent Labs  Lab 11/07/20 1211 11/08/20 1228 11/09/20 0420 11/10/20 0504  NA 136 133* 133* 132*  K 4.1 4.1 3.1* 4.1  CL 93* 98 99 102  CO2 26 23 25 23   GLUCOSE 137* 107* 93 90  BUN 24* 17 16 17   CREATININE  1.25* 0.97 0.79 0.99  CALCIUM 10.4* 9.4 9.0 8.5*  AST 29  --   --   --   ALT 33  --   --   --   ALKPHOS 73  --   --   --   BILITOT 1.5*  --   --   --   ALBUMIN 4.0  --   --   --     No results for input(s): LIPASE, AMYLASE in the last 168 hours.  Recent Labs  Lab 11/07/20 1257  [place]    ------------------------------------------------------------------------------------------------------------------ No results for input(s): CHOL, HDL, LDLCALC, TRIG, CHOLHDL, LDLDIRECT in the last 72 hours.  Lab Results  Component Value Date   HGBA1C 6.6 (H) 01/09/2018   ------------------------------------------------------------------------------------------------------------------ No results for input(s): TSH, T4TOTAL, T3FREE, THYROIDAB in the last 72 hours.  Invalid input(s):  FREET3 ------------------------------------------------------------------------------------------------------------------ No results for input(s): VITAMINB12, FOLATE, FERRITIN, TIBC, IRON, RETICCTPCT in the last 72 hours.  Coagulation profile No results for input(s): INR, PROTIME in the last 168 hours. No results for input(s): DDIMER in the last 72 hours.  Cardiac Enzymes No results for input(s): CKTOTAL, CKMB, CKMBINDEX, TROPONINI in the last 168 hours.  ------------------------------------------------------------------------------------------------------------------    Component Value Date/Time   BNP 445.0 (H) 10/14/2020 2234     Antibiotics: Anti-infectives (From admission, onward)   Start     Dose/Rate Route Frequency Ordered Stop   11/08/20 1800  cefTRIAXone (ROCEPHIN) 1 g in sodium chloride 0.9 % 100 mL IVPB        1 g 200 mL/hr over 30 Minutes Intravenous Every 24 hours 11/07/20 2042 11/09/20 1807   11/07/20 1830  cefTRIAXone (ROCEPHIN) 1 g in sodium chloride 0.9 % 100 mL IVPB        1 g 200 mL/hr over 30 Minutes Intravenous  Once 11/07/20 1818 11/07/20 1855       Radiology Reports  No results found.    DVT prophylaxis: Apixaban  Code Status: Full code  Family Communication: No family at bedside   Consultants:  Cardiology  Procedures:  Echocardiogram    Objective    Physical Examination:    General-appears in no acute distress  Heart-S1-S2, regular, no murmur auscultated  Lungs-clear to auscultation bilaterally, no wheezing or crackles auscultated  Abdomen-soft, nontender, no organomegaly  Extremities-no edema in the lower extremities  Neuro-alert, oriented x3, no focal deficit noted   Status is: Inpatient  Dispo: The patient is from: Home              Anticipated d/c is to: Home              Anticipated d/c date is: 11/11/2020              Patient currently not stable for discharge  Barrier to discharge-as per  cardiology  COVID-19 Labs  No results for input(s): DDIMER, FERRITIN, LDH, CRP in the last 72 hours.  Lab Results  Component Value Date   [place] NEGATIVE 11/07/2020   [place] NEGATIVE 10/17/2020   [employer] NEGATIVE 10/14/2020   [place] NEGATIVE 09/22/2020    Microbiology  Recent Results (from the past 240 hour(s))  Resp Panel by RT-PCR (Flu A&B, Covid) Nasopharyngeal Swab     Status: None   Collection Time: 11/07/20 12:57 PM   Specimen: Nasopharyngeal Swab; Nasopharyngeal(NP) swabs in vial transport medium  Result Value Ref Range Status   SARS Coronavirus 2 by RT PCR NEGATIVE NEGATIVE Final    Comment: (NOTE) SARS-CoV-2 target nucleic acids are NOT  DETECTED.  The SARS-CoV-2 RNA is generally detectable in upper respiratory specimens during the acute phase of infection. The lowest concentration of SARS-CoV-2 viral copies this assay can detect is 138 copies/mL. A negative result does not preclude SARS-Cov-2 infection and should not be used as the sole basis for treatment or other patient management decisions. A negative result may occur with  improper specimen collection/handling, submission of specimen other than nasopharyngeal swab, presence of viral mutation(s) within the areas targeted by this assay, and inadequate number of viral copies(<138 copies/mL). A negative result must be combined with clinical observations, patient history, and epidemiological information. The expected result is Negative.  Fact Sheet for Patients:  EntrepreneurPulse.com.au  Fact Sheet for Healthcare Providers:  IncredibleEmployment.be  This test is no t yet approved or cleared by the [place] FDA and  has been authorized for detection and/or diagnosis of SARS-CoV-2 by FDA under an Emergency Use Authorization (EUA). This EUA will remain  in effect (meaning this test can be used) for the duration of the COVID-19 declaration under Section  564(b)(1) of the Act, 21 U.S.C.section 360bbb-3(b)(1), unless the authorization is terminated  or revoked sooner.       Influenza A by PCR NEGATIVE NEGATIVE Final   Influenza B by PCR NEGATIVE NEGATIVE Final    Comment: (NOTE) The Xpert Xpress SARS-CoV-2/FLU/RSV plus assay is intended as an aid in the diagnosis of influenza from Nasopharyngeal swab specimens and should not be used as a sole basis for treatment. Nasal washings and aspirates are unacceptable for Xpert Xpress SARS-CoV-2/FLU/RSV testing.  Fact Sheet for Patients: EntrepreneurPulse.com.au  Fact Sheet for Healthcare Providers: IncredibleEmployment.be  This test is not yet approved or cleared by the [place] FDA and has been authorized for detection and/or diagnosis of SARS-CoV-2 by FDA under an Emergency Use Authorization (EUA). This EUA will remain in effect (meaning this test can be used) for the duration of the COVID-19 declaration under Section 564(b)(1) of the Act, 21 U.S.C. section 360bbb-3(b)(1), unless the authorization is terminated or revoked.  Performed at [facility], [address] [place], [place] [address]   Urine culture     Status: None   Collection Time: 11/07/20  6:45 PM   Specimen: Urine, Random  Result Value Ref Range Status   Specimen Description   Final    URINE, RANDOM Performed at [facility], [address], [place], [place] [address]    Special Requests   Final    NONE Performed at [facility], [address] [place], [place] [address]    Culture   Final    NO GROWTH Performed at [facility], [place] [address] [place],  [address]    Report Status 11/09/2020 FINAL  Final             Oswald Hillock   [facility] If 7PM-7AM, please contact night-coverage at [email or web address], Office  [phone]   11/10/2020, 7:26 PM  LOS: 0 days

## 2020-11-10 NOTE — Evaluation (Signed)
Physical Therapy Evaluation Patient Details Name: Jody [name] MRN: [id number] DOB: 01-23-1941 Today's Date: 11/10/2020   History of Present Illness  presented to ER after syncopal episode; admitted for management/work up of syncope and collapse, afib  Clinical Impression  Patient resting in bed upon arrival to room; alert and oriented to self only.  Follows simple commands, but demonstrates limited ability to retain/recall new information, limited ability to articulate subjective feelings/symptoms throughout session.  Generally weak and deconditioned throughout all extremities; however, no focal weakness noted.  Currently requiring min assist for bed mobility; min assist for sit/stand, basic transfers and gait (5' x2) with RW.  Demonstrates very short, shuffling steps; hand-over-hand to negotiate RW and facilitate functional movement.  Limited balance reactions noted; notably fatigued with minimal exertion. Orthostatic assessment-vitals stable and WFL with position change; however, patient becomes generally restless with sustained standing position, ultimately requesting return to position.  Unable to fully articulate feelings/symptoms at that time, but does seem to resolve in sitting. Would benefit from skilled PT to address above deficits and promote optimal return to PLOF.; recommend transition to STR upon discharge from acute hospitalization.      Follow Up Recommendations SNF (pending verification of home support with family)    Equipment Recommendations       Recommendations for Other Services       Precautions / Restrictions Precautions Precautions: Fall Restrictions Weight Bearing Restrictions: No      Mobility  Bed Mobility Overal bed mobility: Needs Assistance Bed Mobility: Supine to Sit     Supine to sit: Min assist          Transfers Overall transfer level: Needs assistance Equipment used: Rolling walker (2 wheeled) Transfers: Sit to/from Stand Sit to Stand:  Min assist         General transfer comment: cuing for hand placement, lift off and overall safety/stability  Ambulation/Gait Ambulation/Gait assistance: Min assist Gait Distance (Feet):  (5' x2) Assistive device: Rolling walker (2 wheeled)       General Gait Details: very short, shuffling steps; hand-over-hand to negotiate RW and facilitate functional movement.  Limited balance reactions noted; notably fatigued with minimal exertion  Stairs            Wheelchair Mobility    Modified Rankin (Stroke Patients Only)       Balance Overall balance assessment: Needs assistance Sitting-balance support: No upper extremity supported;Feet supported Sitting balance-Leahy Scale: Good     Standing balance support: Bilateral upper extremity supported Standing balance-Leahy Scale: Fair                               Pertinent Vitals/Pain Pain Assessment: No/denies pain    Home Living Family/patient expects to be discharged to:: Private residence Living Arrangements: Spouse/significant other Available Help at Discharge: Family;Available 24 hours/day;Personal care attendant Type of Home: House Home Access: Stairs to enter Entrance Stairs-Rails: Can reach both Entrance Stairs-Number of Steps: 3-4 steps Home Layout: One level Home Equipment: Walker - 2 wheels;Wheelchair - manual;Bedside commode Additional Comments: Patient limited historian; information taken from previous documentation (provided by husband per note)    Prior Function Level of Independence: Needs assistance   Gait / Transfers Assistance Needed: Pt was previously able to ambulate house hold distances with RW, however most recently has been limited to transfers with RW and most mobility through [place].  Does endorse recent falls and general decline in mobility     Comments:  Patient limited historian; information taken from previous documentation (provided by husband per note)     Hand Dominance         Extremity/Trunk Assessment   Upper Extremity Assessment Upper Extremity Assessment: Generalized weakness    Lower Extremity Assessment Lower Extremity Assessment: Generalized weakness (grossly 4-/5 throughout)       Communication   Communication: No difficulties  Cognition Arousal/Alertness: Awake/alert Behavior During Therapy: WFL for tasks assessed/performed Overall Cognitive Status: No family/caregiver present to determine baseline cognitive functioning                                 General Comments: oriented to self only; follow simple commands, limited ability to recall new information/learning, question ability to interpret/respond to internal stimuli      General Comments      Exercises Other Exercises Other Exercises: Toilet transfer, min/mod assist with RW; hand-over-hand to position/negotiate RW.  Standing balance for peri-care, managing undergarments, min assist Other Exercises: Orthostatic assessment-vitals stable and WFL with position change; however, patient becomes generally restless with sustained standing position, ultimately requesting return to position.  Unable to fully articulate feelings/symptoms at that time, but does seem to resolve in sitting.   Assessment/Plan    PT Assessment Patient needs continued PT services  PT Problem List Decreased strength;Decreased activity tolerance;Decreased balance;Decreased mobility;Decreased cognition       PT Treatment Interventions Gait training;Therapeutic exercise;Balance training;DME instruction;Functional mobility training;Therapeutic activities;Cognitive remediation;Patient/family education    PT Goals (Current goals can be found in the Care Plan section)  Acute Rehab PT Goals Patient Stated Goal: unable to verbalize, agreeable to session; voicing need for toileting PT Goal Formulation: With patient Time For Goal Achievement: 11/24/20 Potential to Achieve Goals: Good    Frequency Min  2X/week   Barriers to discharge        Co-evaluation               AM-PAC PT "6 Clicks" Mobility  Outcome Measure Help needed turning from your back to your side while in a flat bed without using bedrails?: A Little Help needed moving from lying on your back to sitting on the side of a flat bed without using bedrails?: A Little Help needed moving to and from a bed to a chair (including a wheelchair)?: A Little Help needed standing up from a chair using your arms (e.g., wheelchair or bedside chair)?: A Little Help needed to walk in hospital room?: A Little Help needed climbing 3-5 steps with a railing? : A Lot 6 Click Score: 17    End of Session Equipment Utilized During Treatment: Gait belt Activity Tolerance: Patient tolerated treatment well Patient left: in chair;with chair alarm set;with family/visitor present Nurse Communication: Mobility status PT Visit Diagnosis: Muscle weakness (generalized) (M62.81);Difficulty in walking, not elsewhere classified (R26.2);Unsteadiness on feet (R26.81)    Time: 1101-1130 PT Time Calculation (min) (ACUTE ONLY): 29 min   Charges:   PT Evaluation $PT Eval Moderate Complexity: 1 Mod PT Treatments $Therapeutic Activity: 8-22 mins        Sieara Bremer H. Owens Shark, PT, DPT, NCS 11/10/20, 3:04 PM [phone]

## 2020-11-10 NOTE — Progress Notes (Signed)
Patient Name: Jody [name] Date of Encounter: 11/10/2020  Hospital Problem List     Principal Problem:   Syncope and collapse Active Problems:   Benign essential HTN   Atrial fibrillation with rapid ventricular response (HCC)   Dementia without behavioral disturbance (HCC)   Acute lower UTI   Hypercalcemia    Patient Profile      80 y.o.femalewith history ofatrial fibrillation, CHF who was sent to the emergency room after a visit to her primary care's office. After the visit she felt nauseated and apparently felt syncopal. Apparently did not fall. Stated she was feeling better although history was limited due to memory issues. Was admitted May 14 for atrial fibrillation pneumonia and syncope. She had a history of anattempted cardioversion on April 2022. She is treated with amiodarone at 200 mg , apixaban 2.5 mg twice daily, furosemide 20 mg PRN, metoprolol succinate 50 mg twice daily.Echo 7/21 revealed EF 40-45% with moderate mr and tr.EKG on admission showed atrial fibrillation with variable fairly well controlled ventricular response. Chest CT without contrast showed radiation fibrosis in the right upper lobe along with a calcified splenic mass which was stable and no stable hiatal hernia. Chest x-ray showed no active cardiopulmonary disease. Right lower lobe infiltrate seen on CT 3 weeks previously was resolved. Empiric troponins were unremarkable. Urinalysis showed moderate leukocytosis nitrate free. Greater than 50 white cells. Patient had a elevation in troponin from her baseline of approximately 1 to 1.25. White count was 17.4. She was placed on antibiotics empirically. She was continued with amiodarone 200 mg daily digoxin 0.125 mg daily furosemide 20 mg as needed metoprolol succinate 50 mg daily.   Subjective   Poor historian  Inpatient Medications    . amiodarone  200 mg Oral Daily  . apixaban  2.5 mg Oral BID  . digoxin  0.125 mg Oral Daily  .  FLUoxetine  40 mg Oral Daily  . magnesium oxide  400 mg Oral Daily  . megestrol  625 mg Oral Daily  . memantine  5 mg Oral BID  . metoprolol succinate  50 mg Oral Daily  . multivitamin with minerals  1 tablet Oral Daily  . pantoprazole  40 mg Oral Daily  . sodium chloride flush  3 mL Intravenous Q12H    Vital Signs    Vitals:   11/10/20 0421 11/10/20 0428 11/10/20 0846 11/10/20 1154  BP: (!) 153/91  (!) 151/83 111/83  Pulse: 83   61  Resp: 19  17 17   Temp: 98.2 F (36.8 C)  97.8 F (36.6 C) 97.8 F (36.6 C)  TempSrc: Oral     SpO2: 97%   99%  Weight:  52.4 kg    Height:        Intake/Output Summary (Last 24 hours) at 11/10/2020 1659 Last data filed at 11/10/2020 1353 Gross per 24 hour  Intake 1257.12 ml  Output 1250 ml  Net 7.12 ml   Filed Weights   11/08/20 1154 11/09/20 0500 11/10/20 0428  Weight: 51.8 kg 50.5 kg 52.4 kg    Physical Exam    GEN: Well nourished, well developed, in no acute distress.  HEENT: normal.  Neck: Supple, no JVD, carotid bruits, or masses. Cardiac: irr, irr  Respiratory:  Respirations regular and unlabored, clear to auscultation bilaterally. GI: Soft, nontender, nondistended, BS + x 4. MS: no deformity or atrophy. Skin: warm and dry, no rash.   Labs    CBC Recent Labs    11/08/20 1228  11/09/20 0420  WBC 9.7 10.2  HGB 12.3 11.6*  HCT 36.8 32.8*  MCV 86.2 82.0  PLT 265 751   Basic Metabolic Panel Recent Labs    11/09/20 0420 11/10/20 0504  NA 133* 132*  K 3.1* 4.1  CL 99 102  CO2 25 23  GLUCOSE 93 90  BUN 16 17  CREATININE 0.79 0.99  CALCIUM 9.0 8.5*   Liver Function Tests No results for input(s): AST, ALT, ALKPHOS, BILITOT, PROT, ALBUMIN in the last 72 hours. No results for input(s): LIPASE, AMYLASE in the last 72 hours. Cardiac Enzymes No results for input(s): CKTOTAL, CKMB, CKMBINDEX, TROPONINI in the last 72 hours. BNP No results for input(s): BNP in the last 72 hours. D-Dimer No results for input(s): DDIMER  in the last 72 hours. Hemoglobin A1C No results for input(s): HGBA1C in the last 72 hours. Fasting Lipid Panel No results for input(s): CHOL, HDL, LDLCALC, TRIG, CHOLHDL, LDLDIRECT in the last 72 hours. Thyroid Function Tests No results for input(s): TSH, T4TOTAL, T3FREE, THYROIDAB in the last 72 hours.  Invalid input(s): FREET3  Telemetry  afib with controlled vr  ECG    afib  Radiology    CT Head Wo Contrast  Result Date: 10/15/2020 CLINICAL DATA:  Mental status change. Unknown cause. Syncopal episode. Status post ground level fall. EXAM: CT HEAD WITHOUT CONTRAST TECHNIQUE: Contiguous axial images were obtained from the base of the skull through the vertex without intravenous contrast. COMPARISON:  CT head 01/11/2018 FINDINGS: Brain: Cerebral ventricle sizes are concordant with the degree of cerebral volume loss. Patchy and confluent areas of decreased attenuation are noted throughout the deep and periventricular white matter of the cerebral hemispheres bilaterally, compatible with chronic microvascular ischemic disease. No evidence of large-territorial acute infarction. No parenchymal hemorrhage. No mass lesion. No extra-axial collection. No mass effect or midline shift. No hydrocephalus. Basilar cisterns are patent. Vascular: No hyperdense vessel. Atherosclerotic calcifications are present within the cavernous internal carotid arteries. Skull: No acute fracture or focal lesion. Sinuses/Orbits: Paranasal sinuses and mastoid air cells are clear. Bilateral lens replacement. Otherwise orbits are unremarkable. Other: None. IMPRESSION: No acute intracranial abnormality. Electronically Signed   By: Iven Finn M.D.   On: 10/15/2020 00:12   CT Chest Wo Contrast  Result Date: 11/07/2020 CLINICAL DATA:  Pneumonia. EXAM: CT CHEST WITHOUT CONTRAST TECHNIQUE: Multidetector CT imaging of the chest was performed following the standard protocol without IV contrast. COMPARISON:  Oct 16, 2020.  FINDINGS: Cardiovascular: Atherosclerosis of thoracic aorta is noted without aneurysm formation. Normal cardiac size. No pericardial effusion. Coronary artery calcifications are noted. Mediastinum/Nodes: Stable appearance of thyroid gland. Large sliding-type hiatal hernia. No adenopathy is noted. Lungs/Pleura: No pneumothorax or pleural effusion is noted. 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 Patchy consolidation seen in right lower lobe anteriorly on prior exam appears to have resolved. Stable bilateral pulmonary nodules are noted as described on prior exam. Upper Abdomen: Stable large calcified splenic mass is noted. Musculoskeletal: No chest wall mass or suspicious bone lesions identified. IMPRESSION: Patchy consolidation seen in right lower lobe on prior exam appears to have resolved, most consistent with improving inflammation. Stable bilateral pulmonary nodules are noted as described on prior exam. 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 Stable large calcified splenic mass is noted. Stable hiatal hernia. Coronary artery calcifications are noted. Aortic Atherosclerosis (ICD10-I70.0). Electronically Signed   By: Marijo Conception M.D.   On: 11/07/2020 15:40   CT  CHEST WO CONTRAST  Result Date: 10/16/2020 CLINICAL DATA:  Lung nodule, follow-up; history of RIGHT lung cancer post radiation therapy EXAM: CT CHEST WITHOUT CONTRAST TECHNIQUE: Multidetector CT imaging of the chest was performed following the standard protocol without IV contrast. Sagittal and coronal MPR images reconstructed from axial data set. COMPARISON:  05/08/2019, 11/03/2018 FINDINGS: Cardiovascular: Atherosclerotic calcifications aorta, coronary arteries, proximal great vessels. Aorta normal caliber. Heart unremarkable. No pericardial effusion. Mediastinum/Nodes: Large hiatal hernia. Esophagus unremarkable. Tiny BILATERAL thyroid nodules up to 8 mm diameter;  Not clinically significant; no follow-up imaging recommended (ref: J Am Coll Radiol. 2015 Feb;12(2): 143-50).No definite thoracic adenopathy. Slightly prominent RIGHT pulmonary hilum but not grossly changed. Lungs/Pleura: Radiation fibrosis changes again identified RIGHT upper lobe with associated parenchymal calcifications. Changes extend to the RIGHT hilum. 4 mm RIGHT upper lobe nodule image 40 unchanged. 6 x 4 mm RIGHT upper lobe nodule image 39, measured 4 x 4 mm on the previous exam but 6 x 4 mm on the earlier study of 11/03/2018. RIGHT upper lobe nodule 7 x 4 mm image 46 unchanged. Additional scattered nodular foci and calcified granulomata in both lungs including a 6 mm LEFT upper lobe nodule image 39 unchanged. New patchy area of consolidation in RIGHT lower lobe laterally consistent with pneumonia. No pleural effusion or pneumothorax. No definite new or additional enlarging nodules seen. Upper Abdomen: 7.0 cm diameter partially calcified splenic mass with globular enlargement of spleen unchanged. Post cholecystectomy. Atherosclerotic calcifications aorta and SMA. Musculoskeletal: Osseous demineralization. IMPRESSION: Radiation fibrosis changes RIGHT upper lobe. Stable BILATERAL pulmonary nodules. New patchy area of consolidation in RIGHT lower lobe laterally consistent with pneumonia. Large hiatal hernia. 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 Aortic Atherosclerosis (ICD10-I70.0). Electronically Signed   By: Lavonia Dana M.D.   On: 10/16/2020 13:03   DG Chest Port 1 View  Result Date: 11/07/2020 CLINICAL DATA:  Nausea with syncopal episode. EXAM: PORTABLE CHEST 1 VIEW COMPARISON:  Radiographs 10/14/2020.  CT 10/16/2020. FINDINGS: 1211 hours. The heart size and mediastinal contours are stable with mild cardiomegaly and aortic atherosclerosis. Radiation changes in the right perihilar region and associated right upper lobe volume loss are stable. The patchy airspace opacity seen in the right  lower lobe on the recent CT is no longer visualized. The left lung appears clear. There is no pleural effusion or pneumothorax. Small hiatal hernia and calcified splenic mass are noted. IMPRESSION: No evidence of active cardiopulmonary process. Right lower lobe infiltrate seen on CT 3 weeks ago appears resolved. Electronically Signed   By: Richardean Sale M.D.   On: 11/07/2020 12:41   DG Chest Port 1 View  Result Date: 10/14/2020 CLINICAL DATA:  Recent syncopal episode EXAM: PORTABLE CHEST 1 VIEW COMPARISON:  08/29/2020 FINDINGS: Cardiac shadow is at the upper limits of normal in size. Aortic calcifications are noted. Previously seen right-sided pleural effusion has resolved in the interval from the prior exam. Previously seen vascular congestion has improved significantly when compare with the prior exam. There are some areas of increased density in the right suprahilar region consistent with prior radiation therapy and scarring. This is stable from multiple previous CTs dating back to at least 2020. No focal infiltrate or effusion is noted. IMPRESSION: Resolution of previously seen right-sided pleural effusion. Post radiation changes in the right suprahilar region. No acute abnormality noted. Electronically Signed   By: Inez Catalina M.D.   On: 10/14/2020 23:31   ECHOCARDIOGRAM COMPLETE  Result Date: 11/08/2020    ECHOCARDIOGRAM REPORT  Patient Name:   CAMIYA VINAL Date of Exam: 11/08/2020 Medical Rec #:  [id number]      Height:       59.0 in Accession #:    [id number]     Weight:       114.6 lb Date of Birth:  21-Dec-1940       BSA:          1.456 m Patient Age:    45 years       BP:           134/77 mmHg Patient Gender: F              HR:           115 bpm. Exam Location:  [facility] Procedure: 2D Echo Indications:     Syncope  History:         Patient has prior history of Echocardiogram examinations. CHF,                  Arrythmias:Atrial Fibrillation; Risk Factors:Hypertension.  Sonographer:     L  Thornton-Maynard Referring Phys:  [id number] AGBATA Diagnosing Phys: Bartholome Bill MD IMPRESSIONS  1. Left ventricular ejection fraction, by estimation, is 60 to 65%. The left ventricle has normal function. The left ventricle has no regional wall motion abnormalities. There is mild left ventricular hypertrophy. Left ventricular diastolic parameters are consistent with Grade I diastolic dysfunction (impaired relaxation).  2. Right ventricular systolic function is normal. The right ventricular size is normal. There is normal pulmonary artery systolic pressure.  3. Left atrial size was mild to moderately dilated.  4. Right atrial size was mild to moderately dilated.  5. The mitral valve is degenerative. Mild to moderate mitral valve regurgitation.  6. The aortic valve was not well visualized. Aortic valve regurgitation is mild. FINDINGS  Left Ventricle: Left ventricular ejection fraction, by estimation, is 60 to 65%. The left ventricle has normal function. The left ventricle has no regional wall motion abnormalities. The left ventricular internal cavity size was normal in size. There is  mild left ventricular hypertrophy. Left ventricular diastolic parameters are consistent with Grade I diastolic dysfunction (impaired relaxation). Right Ventricle: The right ventricular size is normal. No increase in right ventricular wall thickness. Right ventricular systolic function is normal. There is normal pulmonary artery systolic pressure. The tricuspid regurgitant velocity is 2.66 m/s, and  with an assumed right atrial pressure of 3 mmHg, the estimated right ventricular systolic pressure is 71.2 mmHg. Left Atrium: Left atrial size was mild to moderately dilated. Right Atrium: Right atrial size was mild to moderately dilated. Pericardium: There is no evidence of pericardial effusion. Mitral Valve: The mitral valve is degenerative in appearance. Mild to moderate mitral valve regurgitation. Tricuspid Valve: The tricuspid valve  is grossly normal. Tricuspid valve regurgitation is mild. Aortic Valve: The aortic valve was not well visualized. Aortic valve regurgitation is mild. Aortic regurgitation PHT measures 636 msec. Aortic valve mean gradient measures 3.0 mmHg. Aortic valve peak gradient measures 5.1 mmHg. Aortic valve area, by VTI measures 1.62 cm. Pulmonic Valve: The pulmonic valve was not well visualized. Pulmonic valve regurgitation is trivial. Aorta: The aortic root is normal in size and structure. IAS/Shunts: The atrial septum is grossly normal.  LEFT VENTRICLE PLAX 2D LVIDd:         4.70 cm     Diastology LVIDs:         3.17 cm     LV e' medial:  6.31 cm/s LV PW:         1.00 cm     LV E/e' medial:  16.8 LV IVS:        1.14 cm     LV e' lateral:   7.83 cm/s LVOT diam:     1.90 cm     LV E/e' lateral: 13.5 LV SV:         29 LV SV Index:   20 LVOT Area:     2.84 cm  LV Volumes (MOD) LV vol d, MOD A2C: 69.7 ml LV vol d, MOD A4C: 41.7 ml LV vol s, MOD A2C: 26.1 ml LV vol s, MOD A4C: 21.6 ml LV SV MOD A2C:     43.6 ml LV SV MOD A4C:     41.7 ml LV SV MOD BP:      33.9 ml RIGHT VENTRICLE RV S prime:     11.00 cm/s TAPSE (M-mode): 1.7 cm LEFT ATRIUM              Index       RIGHT ATRIUM           Index LA diam:        4.90 cm  3.37 cm/m  RA Area:     24.50 cm LA Vol (A2C):   78.3 ml  53.78 ml/m RA Volume:   65.90 ml  45.26 ml/m LA Vol (A4C):   102.0 ml 70.06 ml/m LA Biplane Vol: 94.6 ml  64.98 ml/m  AORTIC VALVE                   PULMONIC VALVE AV Area (Vmax):    1.72 cm    PV Vmax:       1.09 m/s AV Area (Vmean):   1.64 cm    PV Peak grad:  4.8 mmHg AV Area (VTI):     1.62 cm AV Vmax:           113.00 cm/s AV Vmean:          86.700 cm/s AV VTI:            0.179 m AV Peak Grad:      5.1 mmHg AV Mean Grad:      3.0 mmHg LVOT Vmax:         68.50 cm/s LVOT Vmean:        50.200 cm/s LVOT VTI:          0.102 m LVOT/AV VTI ratio: 0.57 AI PHT:            636 msec  AORTA Ao Root diam: 2.80 cm MITRAL VALVE                TRICUSPID  VALVE MV Area (PHT): 5.84 cm     TR Peak grad:   28.3 mmHg MV Decel Time: 130 msec     TR Vmax:        266.00 cm/s MV E velocity: 106.00 cm/s                             SHUNTS                             Systemic VTI:  0.10 m  Systemic Diam: 1.90 cm Bartholome Bill MD Electronically signed by Bartholome Bill MD Signature Date/Time: 11/08/2020/8:41:29 AM    Final     Assessment & Plan     80 y.o.femalewith history ofatrial fibrillation, CHF who was sent to the emergency room after a visit to her primary care's office. After the visit she felt nauseated and apparently felt syncopal. Apparently did not fall. Stated she was feeling better although history was limited due to memory issues. Was admitted May 14 for atrial fibrillation pneumonia and syncope. She had a history of anattempted cardioversion on April 2022. She is treated with amiodarone at 200 mg , apixaban 2.5 mg twice daily, furosemide 20 mg PRN, metoprolol succinate 50 mg twice daily.Echo 7/21 revealed EF 40-45% with moderate mr and tr.EKG on admission showed atrial fibrillation with variable fairly well controlled ventricular response. Chest CT without contrast showed radiation fibrosis in the right upper lobe along with a calcified splenic mass which was stable and no stable hiatal hernia. Chest x-ray showed no active cardiopulmonary disease. Right lower lobe infiltrate seen on CT 3 weeks previously was resolved. Empiric troponins were unremarkable. Urinalysis showed moderate leukocytosis nitrate free. Greater than 50 white cells. Patient had a elevation in troponin from her baseline of approximately 1 to 1.25. White count was 17.4. She was placed on antibiotics empirically. She was continued with amiodarone 200 mg daily digoxin 0.125 mg daily furosemide 20 mg as needed metoprolol succinate 50 mg daily.  1. Near syncope-was admitted approximately a month ago with similar complaints although had frank  syncope at that point. Work-up was unremarkable. She had had her beta-blocker held variably due to hypotension during that admission. She was placed on digoxin due to difficult to control A. fib at that time. Echo done a year ago showed good LV function. Echo during this hospitalization if pending. Etiology of event is unclear. No obvious pauses or sustained rapid vr so far that would explain event. Pt is a very difficult historian but husband was with her during this event and with the previous events all of which were similar with no frank syncope but pateint becomes difficult to wake up transiently. Will continue wit current regimen and follow on telemetry.  No significant arrhythmia noted.  Echo showed preserved LV function.  No significant valvular abnormalities.  No obvious arrhythmia or structural abnormality that would explain her syncope.  Does not appear to require any further work-up.  We will continue to follow.  No arrythmia or pauses noted. Continue amodarone and digoxin and metoprolo at current doses. Should be ready for discharge soon.   Signed, Javier Docker Ilian Wessell MD 11/10/2020, 4:59 PM  Pager: [phone]

## 2020-11-10 NOTE — TOC Initial Note (Signed)
Transition of Care [facility]) - Initial/Assessment Note    Patient Details  Name: Jody [name] MRN: [id number] Date of Birth: 10/09/1940  Transition of [facility]) CM/SW Contact:    Eileen Stanford, LCSW Phone Number: 11/10/2020, 3:25 PM  Clinical Narrative:      Pt's spouse present at bedside. Pt's spouse states they have a Aid through long term care insurance and they stay with the pt 8 hours a day every day throughout the week. Pt's spouse also states they can increase that time as well. Pt's spouse does not want pt to go to SNF, he would rather she dc home with the Aid they have and [facility] services if pt can get it. CSW will reach out to [facility] agencies to see if they can service pt.             Expected Discharge Plan: [place] Barriers to Discharge: Continued Medical Work up   Patient Goals and CMS Choice Patient states their goals for this hospitalization and ongoing recovery are:: for pt to dc home and get home services   Choice offered to / list presented to : Spouse  Expected Discharge Plan and Services Expected Discharge Plan: [place] In-house Referral: NA   Post Acute Care Choice: [place] arrangements for the past 2 months: Single Family Home                                      Prior Living Arrangements/Services Living arrangements for the past 2 months: Single Family Home Lives with:: Adult Children,Spouse Patient language and need for interpreter reviewed:: Yes Do you feel safe going back to the place where you live?: Yes      Need for Family Participation in Patient Care: Yes (Comment) Care giver support system in place?: Yes (comment)   Criminal Activity/Legal Involvement Pertinent to Current Situation/Hospitalization: No - Comment as needed  Activities of Daily Living Home Assistive Devices/Equipment: Walker (specify type) ADL Screening (condition at time of admission) Patient's cognitive ability adequate to  safely complete daily activities?: No Is the patient deaf or have difficulty hearing?: No Does the patient have difficulty seeing, even when wearing glasses/contacts?: No Does the patient have difficulty concentrating, remembering, or making decisions?: Yes Patient able to express need for assistance with ADLs?: No Does the patient have difficulty dressing or bathing?: Yes Independently performs ADLs?: No Communication: Independent Dressing (OT): Dependent Is this a change from baseline?: Pre-admission baseline Grooming: Dependent Is this a change from baseline?: Pre-admission baseline Feeding: Needs assistance Is this a change from baseline?: Pre-admission baseline Bathing: Dependent Is this a change from baseline?: Pre-admission baseline Toileting: Dependent Is this a change from baseline?: Pre-admission baseline In/Out Bed: Needs assistance Is this a change from baseline?: Pre-admission baseline Walks in Home: Independent with device (comment) Does the patient have difficulty walking or climbing stairs?: Yes Weakness of Legs: Both Weakness of Arms/Hands: None  Permission Sought/Granted Permission sought to share information with : Family Supports Permission granted to share information with : Yes, Release of Information Signed  Share Information with NAME: Orpah Greek     Permission granted to share info w Relationship: spouse     Emotional Assessment Appearance:: Appears stated age Attitude/Demeanor/Rapport: Unable to Assess Affect (typically observed): Unable to Assess Orientation: : Oriented to Self Alcohol / Substance Use: Not Applicable Psych Involvement: No (comment)  Admission  diagnosis:  Syncope and collapse [R55] Longstanding persistent atrial fibrillation (HCC) [I48.11] Syncope, unspecified syncope type [R55] Patient Active Problem List   Diagnosis Date Noted  . Syncope and collapse 11/07/2020  . Acute lower UTI 11/07/2020  . Hypercalcemia 11/07/2020  .  Hypophosphatemia   . Lobar pneumonia ([place])   . AF (paroxysmal atrial fibrillation) ([place])   . Acute kidney injury superimposed on CKD ([place])   . Hyponatremia   . Hypomagnesemia   . Hypokalemia   . Syncope   . Weakness   . Acute on chronic congestive heart failure ([place])   . Pleural effusion on right   . Dementia without behavioral disturbance ([place])   . Atrial fibrillation with rapid ventricular response ([place]) 08/29/2020  . Chronic a-fib ([place]) 11/28/2018  . History of colostomy reversal 11/28/2018  . Perforation bowel ([place])   . Poor appetite   . Acute delirium   . Goals of care, counseling/discussion   . Palliative care encounter   . Diverticulitis of colon with perforation 12/27/2017  . Atrial fibrillation with RVR ([place]) 12/27/2017  . Malignant neoplasm of upper lobe of right lung ([place]) 12/31/2015  . 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 TI (tricuspid incompetence) 01/21/2015  . Anxiety 01/13/2015  . Clinical depression 01/13/2015  . 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 Borderline diabetes mellitus 01/13/2015  . Benign essential HTN 01/06/2015  . Essential (primary) hypertension 01/06/2015  . Atrial fibrillation, chronic ([place]) 05/20/2014  . 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 Chronic atrial fibrillation ([place]) 05/20/2014  . 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 Cough 11/20/2013  . Breath shortness 11/01/2013  . Breathlessness on exertion 10/30/2013   PCP:  Baxter Hire, MD Pharmacy:   [id number] [address], [place] [address] Phone: [phone] Fax: [place], [place] - [address] [place] [address] Phone: [phone] Fax: [phone]     Social Determinants of Health (SDOH) Interventions    Readmission Risk Interventions Readmission Risk Prevention Plan 10/16/2020   Transportation Screening Complete  PCP or Specialist Appt within 3-5 Days Complete  HRI or [place] Complete  Social Work Consult for [place] Planning/Counseling Complete  Palliative Care Screening Not Applicable  Medication Review Press photographer) Complete  Some recent data might be hidden

## 2020-11-11 DIAGNOSIS — I4891 Unspecified atrial fibrillation: Secondary | ICD-10-CM | POA: Diagnosis not present

## 2020-11-11 DIAGNOSIS — I10 Essential (primary) hypertension: Secondary | ICD-10-CM

## 2020-11-11 DIAGNOSIS — R55 Syncope and collapse: Secondary | ICD-10-CM | POA: Diagnosis not present

## 2020-11-11 LAB — GLUCOSE, CAPILLARY: Glucose-Capillary: 99 mg/dL (ref 70–99)

## 2020-11-11 MED ORDER — AMIODARONE HCL 200 MG PO TABS: 200.0000 mg | ORAL_TABLET | Freq: Every day | ORAL | 2 refills | Status: AC

## 2020-11-11 NOTE — Progress Notes (Signed)
Patient discharged per orders, PIV/tele removed from patient. Discharge instructions provided to husband; expressed understanding. Taken down to vehicle by Therapist, sports and NT via wheelchair.

## 2020-11-11 NOTE — Plan of Care (Signed)

## 2020-11-11 NOTE — Discharge Summary (Signed)
Physician Discharge Summary  Jody [name] KZS:[id number] DOB: November 11, 1940 DOA: 11/07/2020  PCP: Baxter Hire, MD  Admit date: 11/07/2020 Discharge date: 11/11/2020  Time spent: 50* minutes  Recommendations for Outpatient Follow-up:  1. Follow-up cardiology in 1 week 2. Follow-up PCP in 2 weeks 3. Get BMP in 1 week   Discharge Diagnoses:  Principal Problem:   Syncope and collapse Active Problems:   Benign essential HTN   Atrial fibrillation with rapid ventricular response (HCC)   Dementia without behavioral disturbance (HCC)   Hypercalcemia   Discharge Condition: Stable  Diet recommendation: Heart healthy diet  Filed Weights   11/08/20 1154 11/09/20 0500 11/10/20 0428  Weight: 51.8 kg 50.5 kg 52.4 kg    History of present illness:  80 year old female with history of asthma, atrial fibrillation, CHF, GERD, hypertension presented to ED for evaluation of syncope.  Patient was going to doctor's appointment on the way home she started vomiting.  Caregiver took her to shower to wash her up and patient went limp and unresponsive.  This was transient and lasted for about 1 minute.  Husband called EMS and she was found to be in A. fib.  Patient has no known history of A. fib in the past.  In the ED, patient was started on IV Rocephin for possible UTI.  She had pyuria.  Hospital Course:   Syncope and collapse -Likely from orthostatic hypotension; confirmed with positive orthostasis -Echocardiogram shows grade 1 diastolic dysfunction; EF 60 to 65% -Order TED hose bilaterally -Cardiology has signed off and recommend to follow-up in the clinic in 1 week   Atrial fibrillation -Heart rate is controlled -Continue amiodarone,  digoxin, metoprolol -Cardizem has been discontinued -Continue anticoagulation with apixaban   Dementia -Stable, no behavioral disturbance -Continue Namenda   ?  UTI -She had abnormal UA; urine culture showed no growth -IV ceftriaxone has been  discontinued  Hyponatremia -Mild -Check BMP in 1 week at PCP office   Family has refused to go to skilled nursing facility. Wants to go home with home health PT/OT/home health aide  Procedures:    Consultations:  Cardiology  Discharge Exam: Vitals:   11/11/20 0730 11/11/20 1217  BP: (!) 150/80 120/63  Pulse: 65 66  Resp: 13 20  Temp: 97.8 F (36.6 C) 97.7 F (36.5 C)  SpO2: 97% 98%    General: Appears in no acute distress Cardiovascular: S1-S2, regular Respiratory: Clear to auscultation bilaterally  Discharge Instructions   Discharge Instructions    Diet - low sodium heart healthy   Complete by: As directed    Increase activity slowly   Complete by: As directed    No wound care   Complete by: As directed      Allergies as of 11/11/2020      Reactions   Donepezil    Loss of appetite   Poison Oak Extract Other (See Comments)   blistering   Ampicillin Rash   Penicillin V Potassium Rash   Has patient had a PCN reaction causing immediate rash, facial/tongue/throat swelling, SOB or lightheadedness with hypotension: No Has patient had a PCN reaction causing severe rash involving mucus membranes or skin necrosis: No Has patient had a PCN reaction that required hospitalization: No Has patient had a PCN reaction occurring within the last 10 years: No If all of the above answers are "NO", then may proceed with Cephalosporin use.      Medication List    STOP taking these medications   diltiazem 120 MG  24 hr capsule Commonly known as: DILACOR XR   pantoprazole 40 MG tablet Commonly known as: PROTONIX     TAKE these medications   acetaminophen 325 MG tablet Commonly known as: TYLENOL Take 2 tablets (650 mg total) by mouth every 6 (six) hours as needed (fever > 101). What changed:   how much to take  reasons to take this   alendronate 70 MG tablet Commonly known as: FOSAMAX Take 70 mg by mouth once a week. Take with a full glass of water on an empty  stomach.   ALPRAZolam 0.5 MG tablet Commonly known as: XANAX Take 0.5 mg by mouth 2 (two) times daily as needed.   amiodarone 200 MG tablet Commonly known as: Pacerone Take 1 tablet (200 mg total) by mouth daily. Start taking on: November 12, 2020   apixaban 2.5 MG Tabs tablet Commonly known as: ELIQUIS Take 1 tablet (2.5 mg total) by mouth 2 (two) times daily.   Calcium Carbonate-Vitamin D 600-200 MG-UNIT Tabs Take 1 tablet by mouth daily.   digoxin 0.125 MG tablet Commonly known as: LANOXIN Take 1 tablet (0.125 mg total) by mouth daily.   feeding supplement Liqd Take 237 mLs by mouth 2 (two) times daily between meals.   FLUoxetine 40 MG capsule Commonly known as: PROZAC Take 40 mg by mouth daily.   furosemide 20 MG tablet Commonly known as: LASIX Take 1 tablet (20 mg total) by mouth daily as needed (shortness of breath, weight gain of 3 lbs one day or 5 lbs one week).   lovastatin 20 MG tablet Commonly known as: MEVACOR Take 20 mg by mouth at bedtime.   magnesium oxide 400 (240 Mg) MG tablet Commonly known as: MAG-OX Take 1 tablet (400 mg total) by mouth daily.   megestrol 400 MG/10ML suspension Commonly known as: MEGACE Take 15.6 mLs (625 mg total) by mouth daily.   memantine 5 MG tablet Commonly known as: NAMENDA Take 5 mg by mouth 2 (two) times daily.   metoprolol succinate 50 MG 24 hr tablet Commonly known as: Toprol XL Take 1 tablet (50 mg total) by mouth in the morning and at bedtime. Hold if BP is <120 and HR<70   Multi-Vitamins Tabs Take 1 tablet by mouth daily.   omeprazole 20 MG capsule Commonly known as: PRILOSEC Take 20 mg by mouth daily.   traMADol 50 MG tablet Commonly known as: ULTRAM Take 1 tablet (50 mg total) by mouth every 6 (six) hours as needed for severe pain.   traZODone 50 MG tablet Commonly known as: DESYREL Take 1 tablet (50 mg total) by mouth at bedtime.      Allergies  Allergen Reactions  . Donepezil     Loss of  appetite  . Poison Oak Extract Other (See Comments)    blistering  . Ampicillin Rash  . Penicillin V Potassium Rash    Has patient had a PCN reaction causing immediate rash, facial/tongue/throat swelling, SOB or lightheadedness with hypotension: No Has patient had a PCN reaction causing severe rash involving mucus membranes or skin necrosis: No Has patient had a PCN reaction that required hospitalization: No Has patient had a PCN reaction occurring within the last 10 years: No If all of the above answers are "NO", then may proceed with Cephalosporin use.       The results of significant diagnostics from this hospitalization (including imaging, microbiology, ancillary and laboratory) are listed below for reference.    Significant Diagnostic Studies: CT Head Wo Contrast  Result Date: 10/15/2020 CLINICAL DATA:  Mental status change. Unknown cause. Syncopal episode. Status post ground level fall. EXAM: CT HEAD WITHOUT CONTRAST TECHNIQUE: Contiguous axial images were obtained from the base of the skull through the vertex without intravenous contrast. COMPARISON:  CT head 01/11/2018 FINDINGS: Brain: Cerebral ventricle sizes are concordant with the degree of cerebral volume loss. Patchy and confluent areas of decreased attenuation are noted throughout the deep and periventricular white matter of the cerebral hemispheres bilaterally, compatible with chronic microvascular ischemic disease. No evidence of large-territorial acute infarction. No parenchymal hemorrhage. No mass lesion. No extra-axial collection. No mass effect or midline shift. No hydrocephalus. Basilar cisterns are patent. Vascular: No hyperdense vessel. Atherosclerotic calcifications are present within the cavernous internal carotid arteries. Skull: No acute fracture or focal lesion. Sinuses/Orbits: Paranasal sinuses and mastoid air cells are clear. Bilateral lens replacement. Otherwise orbits are unremarkable. Other: None. IMPRESSION: No  acute intracranial abnormality. Electronically Signed   By: Iven Finn M.D.   On: 10/15/2020 00:12   CT Chest Wo Contrast  Result Date: 11/07/2020 CLINICAL DATA:  Pneumonia. EXAM: CT CHEST WITHOUT CONTRAST TECHNIQUE: Multidetector CT imaging of the chest was performed following the standard protocol without IV contrast. COMPARISON:  Oct 16, 2020. FINDINGS: Cardiovascular: Atherosclerosis of thoracic aorta is noted without aneurysm formation. Normal cardiac size. No pericardial effusion. Coronary artery calcifications are noted. Mediastinum/Nodes: Stable appearance of thyroid gland. Large sliding-type hiatal hernia. No adenopathy is noted. Lungs/Pleura: No pneumothorax or pleural effusion is noted. 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 Patchy consolidation seen in right lower lobe anteriorly on prior exam appears to have resolved. Stable bilateral pulmonary nodules are noted as described on prior exam. Upper Abdomen: Stable large calcified splenic mass is noted. Musculoskeletal: No chest wall mass or suspicious bone lesions identified. IMPRESSION: Patchy consolidation seen in right lower lobe on prior exam appears to have resolved, most consistent with improving inflammation. Stable bilateral pulmonary nodules are noted as described on prior exam. 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 Stable large calcified splenic mass is noted. Stable hiatal hernia. Coronary artery calcifications are noted. Aortic Atherosclerosis (ICD10-I70.0). Electronically Signed   By: Marijo Conception M.D.   On: 11/07/2020 15:40   CT CHEST WO CONTRAST  Result Date: 10/16/2020 CLINICAL DATA:  Lung nodule, follow-up; history of RIGHT lung cancer post radiation therapy EXAM: CT CHEST WITHOUT CONTRAST TECHNIQUE: Multidetector CT imaging of the chest was performed following the standard protocol without IV contrast. Sagittal and coronal MPR images  reconstructed from axial data set. COMPARISON:  05/08/2019, 11/03/2018 FINDINGS: Cardiovascular: Atherosclerotic calcifications aorta, coronary arteries, proximal great vessels. Aorta normal caliber. Heart unremarkable. No pericardial effusion. Mediastinum/Nodes: Large hiatal hernia. Esophagus unremarkable. Tiny BILATERAL thyroid nodules up to 8 mm diameter; Not clinically significant; no follow-up imaging recommended (ref: J Am Coll Radiol. 2015 Feb;12(2): 143-50).No definite thoracic adenopathy. Slightly prominent RIGHT pulmonary hilum but not grossly changed. Lungs/Pleura: Radiation fibrosis changes again identified RIGHT upper lobe with associated parenchymal calcifications. Changes extend to the RIGHT hilum. 4 mm RIGHT upper lobe nodule image 40 unchanged. 6 x 4 mm RIGHT upper lobe nodule image 39, measured 4 x 4 mm on the previous exam but 6 x 4 mm on the earlier study of 11/03/2018. RIGHT upper lobe nodule 7 x 4 mm image 46 unchanged. Additional scattered nodular foci and calcified granulomata in both lungs including a 6 mm LEFT upper lobe nodule image 39 unchanged. New patchy area  of consolidation in RIGHT lower lobe laterally consistent with pneumonia. No pleural effusion or pneumothorax. No definite new or additional enlarging nodules seen. Upper Abdomen: 7.0 cm diameter partially calcified splenic mass with globular enlargement of spleen unchanged. Post cholecystectomy. Atherosclerotic calcifications aorta and SMA. Musculoskeletal: Osseous demineralization. IMPRESSION: Radiation fibrosis changes RIGHT upper lobe. Stable BILATERAL pulmonary nodules. New patchy area of consolidation in RIGHT lower lobe laterally consistent with pneumonia. Large hiatal hernia. 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 Aortic Atherosclerosis (ICD10-I70.0). Electronically Signed   By: Lavonia Dana M.D.   On: 10/16/2020 13:03   DG Chest Port 1 View  Result Date: 11/07/2020 CLINICAL DATA:  Nausea with syncopal  episode. EXAM: PORTABLE CHEST 1 VIEW COMPARISON:  Radiographs 10/14/2020.  CT 10/16/2020. FINDINGS: 1211 hours. The heart size and mediastinal contours are stable with mild cardiomegaly and aortic atherosclerosis. Radiation changes in the right perihilar region and associated right upper lobe volume loss are stable. The patchy airspace opacity seen in the right lower lobe on the recent CT is no longer visualized. The left lung appears clear. There is no pleural effusion or pneumothorax. Small hiatal hernia and calcified splenic mass are noted. IMPRESSION: No evidence of active cardiopulmonary process. Right lower lobe infiltrate seen on CT 3 weeks ago appears resolved. Electronically Signed   By: Richardean Sale M.D.   On: 11/07/2020 12:41   DG Chest Port 1 View  Result Date: 10/14/2020 CLINICAL DATA:  Recent syncopal episode EXAM: PORTABLE CHEST 1 VIEW COMPARISON:  08/29/2020 FINDINGS: Cardiac shadow is at the upper limits of normal in size. Aortic calcifications are noted. Previously seen right-sided pleural effusion has resolved in the interval from the prior exam. Previously seen vascular congestion has improved significantly when compare with the prior exam. There are some areas of increased density in the right suprahilar region consistent with prior radiation therapy and scarring. This is stable from multiple previous CTs dating back to at least 2020. No focal infiltrate or effusion is noted. IMPRESSION: Resolution of previously seen right-sided pleural effusion. Post radiation changes in the right suprahilar region. No acute abnormality noted. Electronically Signed   By: Inez Catalina M.D.   On: 10/14/2020 23:31   ECHOCARDIOGRAM COMPLETE  Result Date: 11/08/2020    ECHOCARDIOGRAM REPORT   Patient Name:   YAMILET MCFAYDEN Date of Exam: 11/08/2020 Medical Rec #:  [id number]      Height:       59.0 in Accession #:    [id number]     Weight:       114.6 lb Date of Birth:  [birth year]       BSA:          1.456 m  Patient Age:    57 years       BP:           134/77 mmHg Patient Gender: F              HR:           115 bpm. Exam Location:  [facility] Procedure: 2D Echo Indications:     Syncope  History:         Patient has prior history of Echocardiogram examinations. CHF,                  Arrythmias:Atrial Fibrillation; Risk Factors:Hypertension.  Sonographer:     L Thornton-Maynard Referring Phys:  [id number] AGBATA Diagnosing Phys: Bartholome Bill MD IMPRESSIONS  1. Left ventricular ejection fraction, by estimation,  is 60 to 65%. The left ventricle has normal function. The left ventricle has no regional wall motion abnormalities. There is mild left ventricular hypertrophy. Left ventricular diastolic parameters are consistent with Grade I diastolic dysfunction (impaired relaxation).  2. Right ventricular systolic function is normal. The right ventricular size is normal. There is normal pulmonary artery systolic pressure.  3. Left atrial size was mild to moderately dilated.  4. Right atrial size was mild to moderately dilated.  5. The mitral valve is degenerative. Mild to moderate mitral valve regurgitation.  6. The aortic valve was not well visualized. Aortic valve regurgitation is mild. FINDINGS  Left Ventricle: Left ventricular ejection fraction, by estimation, is 60 to 65%. The left ventricle has normal function. The left ventricle has no regional wall motion abnormalities. The left ventricular internal cavity size was normal in size. There is  mild left ventricular hypertrophy. Left ventricular diastolic parameters are consistent with Grade I diastolic dysfunction (impaired relaxation). Right Ventricle: The right ventricular size is normal. No increase in right ventricular wall thickness. Right ventricular systolic function is normal. There is normal pulmonary artery systolic pressure. The tricuspid regurgitant velocity is 2.66 m/s, and  with an assumed right atrial pressure of 3 mmHg, the estimated right ventricular  systolic pressure is 00.7 mmHg. Left Atrium: Left atrial size was mild to moderately dilated. Right Atrium: Right atrial size was mild to moderately dilated. Pericardium: There is no evidence of pericardial effusion. Mitral Valve: The mitral valve is degenerative in appearance. Mild to moderate mitral valve regurgitation. Tricuspid Valve: The tricuspid valve is grossly normal. Tricuspid valve regurgitation is mild. Aortic Valve: The aortic valve was not well visualized. Aortic valve regurgitation is mild. Aortic regurgitation PHT measures 636 msec. Aortic valve mean gradient measures 3.0 mmHg. Aortic valve peak gradient measures 5.1 mmHg. Aortic valve area, by VTI measures 1.62 cm. Pulmonic Valve: The pulmonic valve was not well visualized. Pulmonic valve regurgitation is trivial. Aorta: The aortic root is normal in size and structure. IAS/Shunts: The atrial septum is grossly normal.  LEFT VENTRICLE PLAX 2D LVIDd:         4.70 cm     Diastology LVIDs:         3.17 cm     LV e' medial:    6.31 cm/s LV PW:         1.00 cm     LV E/e' medial:  16.8 LV IVS:        1.14 cm     LV e' lateral:   7.83 cm/s LVOT diam:     1.90 cm     LV E/e' lateral: 13.5 LV SV:         29 LV SV Index:   20 LVOT Area:     2.84 cm  LV Volumes (MOD) LV vol d, MOD A2C: 69.7 ml LV vol d, MOD A4C: 41.7 ml LV vol s, MOD A2C: 26.1 ml LV vol s, MOD A4C: 21.6 ml LV SV MOD A2C:     43.6 ml LV SV MOD A4C:     41.7 ml LV SV MOD BP:      33.9 ml RIGHT VENTRICLE RV S prime:     11.00 cm/s TAPSE (M-mode): 1.7 cm LEFT ATRIUM              Index       RIGHT ATRIUM           Index LA diam:  4.90 cm  3.37 cm/m  RA Area:     24.50 cm LA Vol (A2C):   78.3 ml  53.78 ml/m RA Volume:   65.90 ml  45.26 ml/m LA Vol (A4C):   102.0 ml 70.06 ml/m LA Biplane Vol: 94.6 ml  64.98 ml/m  AORTIC VALVE                   PULMONIC VALVE AV Area (Vmax):    1.72 cm    PV Vmax:       1.09 m/s AV Area (Vmean):   1.64 cm    PV Peak grad:  4.8 mmHg AV Area (VTI):      1.62 cm AV Vmax:           113.00 cm/s AV Vmean:          86.700 cm/s AV VTI:            0.179 m AV Peak Grad:      5.1 mmHg AV Mean Grad:      3.0 mmHg LVOT Vmax:         68.50 cm/s LVOT Vmean:        50.200 cm/s LVOT VTI:          0.102 m LVOT/AV VTI ratio: 0.57 AI PHT:            636 msec  AORTA Ao Root diam: 2.80 cm MITRAL VALVE                TRICUSPID VALVE MV Area (PHT): 5.84 cm     TR Peak grad:   28.3 mmHg MV Decel Time: 130 msec     TR Vmax:        266.00 cm/s MV E velocity: 106.00 cm/s                             SHUNTS                             Systemic VTI:  0.10 m                             Systemic Diam: 1.90 cm Bartholome Bill MD Electronically signed by Bartholome Bill MD Signature Date/Time: 11/08/2020/8:41:29 AM    Final     Microbiology: Recent Results (from the past 240 hour(s))  Resp Panel by RT-PCR (Flu A&B, Covid) Nasopharyngeal Swab     Status: None   Collection Time: 11/07/20 12:57 PM   Specimen: Nasopharyngeal Swab; Nasopharyngeal(NP) swabs in vial transport medium  Result Value Ref Range Status   SARS Coronavirus 2 by RT PCR NEGATIVE NEGATIVE Final    Comment: (NOTE) SARS-CoV-2 target nucleic acids are NOT DETECTED.  The SARS-CoV-2 RNA is generally detectable in upper respiratory specimens during the acute phase of infection. The lowest concentration of SARS-CoV-2 viral copies this assay can detect is 138 copies/mL. A negative result does not preclude SARS-Cov-2 infection and should not be used as the sole basis for treatment or other patient management decisions. A negative result may occur with  improper specimen collection/handling, submission of specimen other than nasopharyngeal swab, presence of viral mutation(s) within the areas targeted by this assay, and inadequate number of viral copies(<138 copies/mL). A negative result must be combined with clinical observations, patient history, and epidemiological information. The expected result is Negative.  Fact  Sheet for Patients:  EntrepreneurPulse.com.au  Fact Sheet for Healthcare Providers:  IncredibleEmployment.be  This test is no t yet approved or cleared by the [place] FDA and  has been authorized for detection and/or diagnosis of SARS-CoV-2 by FDA under an Emergency Use Authorization (EUA). This EUA will remain  in effect (meaning this test can be used) for the duration of the COVID-19 declaration under Section 564(b)(1) of the Act, 21 U.S.C.section 360bbb-3(b)(1), unless the authorization is terminated  or revoked sooner.       Influenza A by PCR NEGATIVE NEGATIVE Final   Influenza B by PCR NEGATIVE NEGATIVE Final    Comment: (NOTE) The Xpert Xpress SARS-CoV-2/FLU/RSV plus assay is intended as an aid in the diagnosis of influenza from Nasopharyngeal swab specimens and should not be used as a sole basis for treatment. Nasal washings and aspirates are unacceptable for Xpert Xpress SARS-CoV-2/FLU/RSV testing.  Fact Sheet for Patients: EntrepreneurPulse.com.au  Fact Sheet for Healthcare Providers: IncredibleEmployment.be  This test is not yet approved or cleared by the [place] FDA and has been authorized for detection and/or diagnosis of SARS-CoV-2 by FDA under an Emergency Use Authorization (EUA). This EUA will remain in effect (meaning this test can be used) for the duration of the COVID-19 declaration under Section 564(b)(1) of the Act, 21 U.S.C. section 360bbb-3(b)(1), unless the authorization is terminated or revoked.  Performed at [facility], [address], [place], [place] [address]   Urine culture     Status: None   Collection Time: 11/07/20  6:45 PM   Specimen: Urine, Random  Result Value Ref Range Status   Specimen Description   Final    URINE, RANDOM Performed at [facility], [address], [place], [place] [address]    Special Requests   Final     NONE Performed at [facility], [address], [place], [place] [address]    Culture   Final    NO GROWTH Performed at [facility], [place] [address], [place],  [address]    Report Status 11/09/2020 FINAL  Final     Labs: Basic Metabolic Panel: Recent Labs  Lab 11/07/20 1211 11/08/20 1228 11/09/20 0420 11/10/20 0504 11/10/20 1925  NA 136 133* 133* 132* 129*  K 4.1 4.1 3.1* 4.1 4.2  CL 93* 98 99 102 100  CO2 26 23 25 23  21*  GLUCOSE 137* 107* 93 90 131*  BUN 24* 17 16 17 15   CREATININE 1.25* 0.97 0.79 0.99 1.01*  CALCIUM 10.4* 9.4 9.0 8.5* 8.7*   Liver Function Tests: Recent Labs  Lab 11/07/20 1211  AST 29  ALT 33  ALKPHOS 73  BILITOT 1.5*  PROT 7.7  ALBUMIN 4.0   No results for input(s): LIPASE, AMYLASE in the last 168 hours. No results for input(s): AMMONIA in the last 168 hours. CBC: Recent Labs  Lab 11/07/20 1211 11/08/20 1228 11/09/20 0420 11/10/20 1925  WBC 17.4* 9.7 10.2 11.0*  NEUTROABS  --   --   --  9.2*  HGB 14.2 12.3 11.6* 12.3  HCT 40.8 36.8 32.8* 36.4  MCV 82.9 86.2 82.0 85.0  PLT 380 265 276 311   Cardiac Enzymes: No results for input(s): CKTOTAL, CKMB, CKMBINDEX, TROPONINI in the last 168 hours. BNP: BNP (last 3 results) Recent Labs    08/29/20 1742 10/14/20 2234  BNP 1,896.8* 445.0*    ProBNP (last 3 results) No results for input(s): PROBNP in the last 8760 hours.  CBG: Recent Labs  Lab 11/09/20 0458 11/10/20 0424 11/11/20 0401  GLUCAP 100* 78 99       Signed:  Oswald Hillock MD.  [facility] 11/11/2020, 2:21 PM

## 2020-11-16 ENCOUNTER — Emergency Department
Admission: EM | Admit: 2020-11-16 | Discharge: 2020-11-16 | Disposition: A | Discharge status: Home / Self Care | Payer: Medicare Other | Attending: Emergency Medicine | Admitting: Emergency Medicine

## 2020-11-16 ENCOUNTER — Emergency Department: Payer: Medicare Other

## 2020-11-16 ENCOUNTER — Other Ambulatory Visit: Payer: Self-pay

## 2020-11-16 DIAGNOSIS — Z7901 Long term (current) use of anticoagulants: Secondary | ICD-10-CM | POA: Insufficient documentation

## 2020-11-16 DIAGNOSIS — I4891 Unspecified atrial fibrillation: Secondary | ICD-10-CM | POA: Insufficient documentation

## 2020-11-16 DIAGNOSIS — R55 Syncope and collapse: Secondary | ICD-10-CM | POA: Diagnosis present

## 2020-11-16 DIAGNOSIS — E099 Drug or chemical induced diabetes mellitus without complications: Secondary | ICD-10-CM | POA: Diagnosis not present

## 2020-11-16 DIAGNOSIS — B9689 Other specified bacterial agents as the cause of diseases classified elsewhere: Secondary | ICD-10-CM | POA: Insufficient documentation

## 2020-11-16 DIAGNOSIS — N3090 Cystitis, unspecified without hematuria: Secondary | ICD-10-CM | POA: Diagnosis not present

## 2020-11-16 DIAGNOSIS — J45909 Unspecified asthma, uncomplicated: Secondary | ICD-10-CM | POA: Diagnosis not present

## 2020-11-16 DIAGNOSIS — Z85118 Personal history of other malignant neoplasm of bronchus and lung: Secondary | ICD-10-CM | POA: Diagnosis not present

## 2020-11-16 DIAGNOSIS — E860 Dehydration: Secondary | ICD-10-CM | POA: Insufficient documentation

## 2020-11-16 DIAGNOSIS — Z79899 Other long term (current) drug therapy: Secondary | ICD-10-CM | POA: Insufficient documentation

## 2020-11-16 DIAGNOSIS — N189 Chronic kidney disease, unspecified: Secondary | ICD-10-CM | POA: Insufficient documentation

## 2020-11-16 DIAGNOSIS — I509 Heart failure, unspecified: Secondary | ICD-10-CM

## 2020-11-16 DIAGNOSIS — F0390 Unspecified dementia without behavioral disturbance: Secondary | ICD-10-CM | POA: Insufficient documentation

## 2020-11-16 DIAGNOSIS — I130 Hypertensive heart and chronic kidney disease with heart failure and stage 1 through stage 4 chronic kidney disease, or unspecified chronic kidney disease: Secondary | ICD-10-CM | POA: Insufficient documentation

## 2020-11-16 LAB — URINALYSIS, COMPLETE (UACMP) WITH MICROSCOPIC
Bilirubin Urine: NEGATIVE
Glucose, UA: NEGATIVE mg/dL
Hgb urine dipstick: NEGATIVE
Ketones, ur: NEGATIVE mg/dL
Nitrite: NEGATIVE
Protein, ur: 100 mg/dL — AB
Specific Gravity, Urine: 1.016 (ref 1.005–1.030)
WBC, UA: >50 WBC/hpf — ABNORMAL HIGH (ref 0–5)
pH: 7 (ref 5.0–8.0)

## 2020-11-16 LAB — CBC WITH DIFFERENTIAL/PLATELET
Abs Immature Granulocytes: 0.14 10*3/uL — ABNORMAL HIGH (ref 0.00–0.07)
Basophils Absolute: 0 10*3/uL (ref 0.0–0.1)
Basophils Relative: 0 %
Eosinophils Absolute: 0 10*3/uL (ref 0.0–0.5)
Eosinophils Relative: 0 %
HCT: 36.2 % (ref 36.0–46.0)
Hemoglobin: 12.3 g/dL (ref 12.0–15.0)
Immature Granulocytes: 1 %
Lymphocytes Relative: 5 %
Lymphs Abs: 0.7 10*3/uL (ref 0.7–4.0)
MCH: 28.8 pg (ref 26.0–34.0)
MCHC: 34 g/dL (ref 30.0–36.0)
MCV: 84.8 fL (ref 80.0–100.0)
Monocytes Absolute: 1.1 10*3/uL — ABNORMAL HIGH (ref 0.1–1.0)
Monocytes Relative: 8 %
Neutro Abs: 12.1 10*3/uL — ABNORMAL HIGH (ref 1.7–7.7)
Neutrophils Relative %: 86 %
Platelets: 325 10*3/uL (ref 150–400)
RBC: 4.27 MIL/uL (ref 3.87–5.11)
RDW: 16.8 % — ABNORMAL HIGH (ref 11.5–15.5)
WBC: 14.1 10*3/uL — ABNORMAL HIGH (ref 4.0–10.5)
nRBC: 0 % (ref 0.0–0.2)

## 2020-11-16 LAB — COMPREHENSIVE METABOLIC PANEL
ALT: 32 U/L (ref 0–44)
AST: 28 U/L (ref 15–41)
Albumin: 3.7 g/dL (ref 3.5–5.0)
Alkaline Phosphatase: 62 U/L (ref 38–126)
Anion gap: 6 (ref 5–15)
BUN: 16 mg/dL (ref 8–23)
CO2: 24 mmol/L (ref 22–32)
Calcium: 9.1 mg/dL (ref 8.9–10.3)
Chloride: 100 mmol/L (ref 98–111)
Creatinine, Ser: 1.1 mg/dL — ABNORMAL HIGH (ref 0.44–1.00)
GFR, Estimated: 50 mL/min — ABNORMAL LOW (ref >60)
Glucose, Bld: 167 mg/dL — ABNORMAL HIGH (ref 70–99)
Potassium: 4.2 mmol/L (ref 3.5–5.1)
Sodium: 130 mmol/L — ABNORMAL LOW (ref 135–145)
Total Bilirubin: 1.3 mg/dL — ABNORMAL HIGH (ref 0.3–1.2)
Total Protein: 6.4 g/dL — ABNORMAL LOW (ref 6.5–8.1)

## 2020-11-16 LAB — TROPONIN I (HIGH SENSITIVITY)
Troponin I (High Sensitivity): 8 ng/L (ref <18)
Troponin I (High Sensitivity): 8 ng/L (ref <18)

## 2020-11-16 LAB — LIPASE, BLOOD: Lipase: 38 U/L (ref 11–51)

## 2020-11-16 MED ORDER — CEPHALEXIN 500 MG PO CAPS: 500.0000 mg | ORAL_CAPSULE | Freq: Two times a day (BID) | ORAL | 0 refills | Status: DC

## 2020-11-16 MED ORDER — SODIUM CHLORIDE 0.9 % IV SOLN
1.0000 g | Freq: Once | INTRAVENOUS | Status: AC
  Administered 2020-11-16: 1 g via INTRAVENOUS
  Filled 2020-11-16: qty 10

## 2020-11-16 MED ORDER — SODIUM CHLORIDE 0.9 % IV BOLUS
500.0000 mL | Freq: Once | INTRAVENOUS | Status: AC
  Administered 2020-11-16: 500 mL via INTRAVENOUS

## 2020-11-16 MED ORDER — ONDANSETRON 4 MG PO TBDP: 4.0000 mg | ORAL_TABLET | Freq: Three times a day (TID) | ORAL | 0 refills | Status: AC | PRN

## 2020-11-16 NOTE — ED Provider Notes (Signed)
[facility] Emergency Department Provider Note  ____________________________________________  Time seen: Approximately 9:27 PM  I have reviewed the triage vital signs and the nursing notes.   HISTORY  Chief Complaint Loss of Consciousness    Level 5 Caveat: Portions of the History and Physical including HPI and review of systems are unable to be completely obtained due to patient being a poor historian   HPI Jody [name] is a 80 y.o. female with a past history of atrial fibrillation CHF GERD hypertension dementia who was brought to the ED due to syncope at home.  She discounted the bathroom, and when standing up she passed out.  EMS report that husband denied any injury to them.  They found her to be in A. fib with a heart rate of 150 so they gave 500 mL bolus of fluids prior to arrival with improvement of heart rate to about 90.  Blood pressure stable.  Patient denies any acute pain, no chest pain or shortness of breath.  No vomiting or diarrhea.    Past Medical History:  Diagnosis Date   Asthma    Atrial fibrillation (HCC)    CHF (congestive heart failure) (HCC)    Complication of anesthesia    Slow to wake after colostomy   Dysrhythmia    GERD (gastroesophageal reflux disease)    Hypertension    Memory difficulties    Primary adenocarcinoma of right lung ([place]) 02/2005   Rad tx's.    Uses walker      Patient Active Problem List   Diagnosis Date Noted   Syncope and collapse 11/07/2020   Acute lower UTI 11/07/2020   Hypercalcemia 11/07/2020   Hypophosphatemia    Lobar pneumonia (HCC)    AF (paroxysmal atrial fibrillation) (HCC)    Acute kidney injury superimposed on CKD ([place])    Hyponatremia    Hypomagnesemia    Hypokalemia    Syncope    Weakness    Acute on chronic congestive heart failure (HCC)    Pleural effusion on right    Dementia without behavioral disturbance (HCC)    Atrial fibrillation with rapid ventricular response ([place])  08/29/2020   Chronic a-fib ([place]) 11/28/2018   History of colostomy reversal 11/28/2018   Perforation bowel ([place])    Poor appetite    Acute delirium    Goals of care, counseling/discussion    Palliative care encounter    Diverticulitis of colon with perforation 12/27/2017   Atrial fibrillation with RVR ([place]) 12/27/2017   Malignant neoplasm of upper lobe of right lung ([place]) 12/31/2015   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   TI (tricuspid incompetence) 01/21/2015   Anxiety 01/13/2015   Clinical depression 01/13/2015   Borderline diabetes 01/13/2015   Borderline diabetes mellitus 01/13/2015   Benign essential HTN 01/06/2015   Essential (primary) hypertension 01/06/2015   Atrial fibrillation, chronic ([place]) 05/20/2014   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   Chronic atrial fibrillation ([place]) 05/20/2014   MI (mitral incompetence) 11/26/2013   Cough 11/20/2013   Breath shortness 11/01/2013   Breathlessness on exertion 10/30/2013     Past Surgical History:  Procedure Laterality Date   CARDIOVERSION N/A 09/24/2020   Procedure: CARDIOVERSION;  Surgeon: Corey Skains, MD;  Location: [facility] ORS;  Service: Cardiovascular;  Laterality: N/A;   CATARACT EXTRACTION W/PHACO Left 04/29/2020   Procedure: CATARACT EXTRACTION PHACO AND INTRAOCULAR LENS PLACEMENT ([place]) LEFT 17.47 01:28.8;  Surgeon: Birder Robson, MD;  Location: [place];  Service: Ophthalmology;  Laterality:  Left;   CATARACT EXTRACTION W/PHACO Right 05/20/2020   Procedure: CATARACT EXTRACTION PHACO AND INTRAOCULAR LENS PLACEMENT ([facility]) RIGHT 14.89 01:13.8;  Surgeon: Birder Robson, MD;  Location: [place];  Service: Ophthalmology;  Laterality: Right;   COLOSTOMY N/A 01/05/2018   Procedure: COLOSTOMY;  Surgeon: Benjamine Sprague, DO;  Location: [facility];  Service: General;  Laterality: N/A;   COLOSTOMY TAKEDOWN N/A 11/28/2018   Procedure: COLOSTOMY TAKEDOWN, DIABETIC;  Surgeon: Benjamine Sprague, DO;   Location: [facility];  Service: General;  Laterality: N/A;   LAPAROTOMY N/A 01/05/2018   Procedure: EXPLORATORY LAPAROTOMY/ POSSIBLE BOWEL OBSTRUCTION;  Surgeon: Benjamine Sprague, DO;  Location: [facility];  Service: General;  Laterality: N/A;   LYSIS OF ADHESION N/A 01/05/2018   Procedure: LYSIS OF ADHESION;  Surgeon: Benjamine Sprague, DO;  Location: [facility] OR[facility];  Service: General;  Laterality: N/A;     Prior to Admission medications   Medication Sig Start Date End Date Taking? Authorizing Provider  cephALEXin (KEFLEX) 500 MG capsule Take 1 capsule (500 mg total) by mouth 2 (two) times daily. 11/16/20  Yes Carrie Mew, MD  ondansetron (ZOFRAN ODT) 4 MG disintegrating tablet Take 1 tablet (4 mg total) by mouth every 8 (eight) hours as needed for nausea or vomiting. 11/16/20  Yes Carrie Mew, MD  acetaminophen (TYLENOL) 325 MG tablet Take 2 tablets (650 mg total) by mouth every 6 (six) hours as needed (fever > 101). Patient taking differently: Take 325 mg by mouth every 6 (six) hours as needed for moderate pain. 12/03/18   Olean Ree, MD  alendronate (FOSAMAX) 70 MG tablet Take 70 mg by mouth once a week. Take with a full glass of water on an empty stomach.    [provider]  ALPRAZolam Duanne Moron) 0.5 MG tablet Take 0.5 mg by mouth 2 (two) times daily as needed. 10/21/20   [provider]  amiodarone (PACERONE) 200 MG tablet Take 1 tablet (200 mg total) by mouth daily. 11/12/20   Oswald Hillock, MD  apixaban (ELIQUIS) 2.5 MG TABS tablet Take 1 tablet (2.5 mg total) by mouth 2 (two) times daily. 09/01/20   Fritzi Mandes, MD  Calcium Carbonate-Vitamin D 600-200 MG-UNIT TABS Take 1 tablet by mouth daily. Patient not taking: Reported on 11/08/2020    [provider]  digoxin (LANOXIN) 0.125 MG tablet Take 1 tablet (0.125 mg total) by mouth daily. Patient not taking: Reported on 11/08/2020 10/18/20   Loletha Grayer, MD  feeding supplement (ENSURE ENLIVE / ENSURE PLUS) LIQD Take 237 mLs by  mouth 2 (two) times daily between meals. 10/17/20   Loletha Grayer, MD  FLUoxetine (PROZAC) 40 MG capsule Take 40 mg by mouth daily.    [provider]  furosemide (LASIX) 20 MG tablet Take 1 tablet (20 mg total) by mouth daily as needed (shortness of breath, weight gain of 3 lbs one day or 5 lbs one week). 10/17/20   Loletha Grayer, MD  lovastatin (MEVACOR) 20 MG tablet Take 20 mg by mouth at bedtime.    [provider]  magnesium oxide (MAG-OX) 400 (240 Mg) MG tablet Take 1 tablet (400 mg total) by mouth daily. Patient not taking: Reported on 11/08/2020 10/18/20   Loletha Grayer, MD  megestrol (MEGACE) 400 MG/10ML suspension Take 15.6 mLs (625 mg total) by mouth daily. 10/18/20   Loletha Grayer, MD  memantine (NAMENDA) 5 MG tablet Take 5 mg by mouth 2 (two) times daily. 08/23/20   [provider]  metoprolol succinate (TOPROL XL) 50  MG 24 hr tablet Take 1 tablet (50 mg total) by mouth in the morning and at bedtime. Hold if BP is <120 and HR<70 09/01/20   Fritzi Mandes, MD  Multiple Vitamin (MULTI-VITAMINS) TABS Take 1 tablet by mouth daily.  Patient not taking: Reported on 11/08/2020    [provider]  omeprazole (PRILOSEC) 20 MG capsule Take 20 mg by mouth daily.    [provider]  traMADol (ULTRAM) 50 MG tablet Take 1 tablet (50 mg total) by mouth every 6 (six) hours as needed for severe pain. 10/17/20   Loletha Grayer, MD  traZODone (DESYREL) 50 MG tablet Take 1 tablet (50 mg total) by mouth at bedtime. Patient not taking: Reported on 11/08/2020 10/17/20   Loletha Grayer, MD     Allergies Donepezil, Poison oak extract, Ampicillin, and Penicillin v potassium   Family History  Problem Relation Age of Onset   Breast cancer Paternal Aunt     Social History Social History   Tobacco Use   Smoking status: Never   Smokeless tobacco: Never  Vaping Use   Vaping Use: Never used  Substance Use Topics   Alcohol use: No   Drug use: Never     Review of Systems Level 5 Caveat: Portions of the History and Physical including HPI and review of systems are unable to be completely obtained due to patient being a poor historian   Constitutional:   No known fever.  ENT:   No rhinorrhea. Cardiovascular:   No chest pain positive syncope. Respiratory:   No dyspnea or cough. Gastrointestinal:   Negative for abdominal pain, vomiting and diarrhea.  Musculoskeletal:   Negative for focal pain or swelling ____________________________________________   PHYSICAL EXAM:  VITAL SIGNS: ED Triage Vitals  Enc Vitals Group     BP 11/16/20 1603 119/69     Pulse Rate 11/16/20 1554 89     Resp 11/16/20 1607 16     Temp 11/16/20 1551 97.9 F (36.6 C)     Temp Source 11/16/20 1551 Oral     SpO2 11/16/20 1554 100 %     Weight 11/16/20 1549 118 lb 3.2 oz (53.6 kg)     Height 11/16/20 1549 4\' 11"  (1.499 m)     Head Circumference --      Peak Flow --      Pain Score --      Pain Loc --      Pain Edu? --      Excl. in [place]? --     Vital signs reviewed, nursing assessments reviewed.   Constitutional:   Alert and oriented to self. Non-toxic appearance. Eyes:   Conjunctivae are normal. EOMI. PERRL. ENT      Head:   Normocephalic and atraumatic.      Nose:   No congestion/rhinnorhea.       Mouth/Throat:   Dry mucous membranes, no pharyngeal erythema. No peritonsillar mass.       Neck:   No meningismus. Full ROM. Hematological/Lymphatic/Immunilogical:   No cervical lymphadenopathy. Cardiovascular:   Irregularly irregular rhythm, heart rate 90. Symmetric bilateral radial and DP pulses.  No murmurs. Cap refill less than 2 seconds. Respiratory:   Normal respiratory effort without tachypnea/retractions. Breath sounds are clear and equal bilaterally. No wheezes/rales/rhonchi. Gastrointestinal:   Soft and nontender. Non distended. There is no CVA tenderness.  No rebound, rigidity, or guarding. Genitourinary:   deferred Musculoskeletal:   Normal  range of motion in all extremities. No joint effusions.  No lower  extremity tenderness.  No edema. Neurologic:   Normal speech, limited language range Motor grossly intact. No acute focal neurologic deficits are appreciated.  Skin:    Skin is warm, dry and intact. No rash noted.  No petechiae, purpura, or bullae.  ____________________________________________    LABS (pertinent positives/negatives) (all labs ordered are listed, but only abnormal results are displayed) Labs Reviewed  COMPREHENSIVE METABOLIC PANEL - Abnormal; Notable for the following components:      Result Value   Sodium 130 (*)    Glucose, Bld 167 (*)    Creatinine, Ser 1.10 (*)    Total Protein 6.4 (*)    Total Bilirubin 1.3 (*)    GFR, Estimated 50 (*)    All other components within normal limits  CBC WITH DIFFERENTIAL/PLATELET - Abnormal; Notable for the following components:   WBC 14.1 (*)    RDW 16.8 (*)    Neutro Abs 12.1 (*)    Monocytes Absolute 1.1 (*)    Abs Immature Granulocytes 0.14 (*)    All other components within normal limits  URINALYSIS, COMPLETE (UACMP) WITH MICROSCOPIC - Abnormal; Notable for the following components:   Color, Urine YELLOW (*)    APPearance CLOUDY (*)    Protein, ur 100 (*)    Leukocytes,Ua LARGE (*)    WBC, UA >50 (*)    Bacteria, UA RARE (*)    Non Squamous Epithelial PRESENT (*)    All other components within normal limits  URINE CULTURE  LIPASE, BLOOD  TROPONIN I (HIGH SENSITIVITY)  TROPONIN I (HIGH SENSITIVITY)   ____________________________________________   EKG  Interpreted by me Atrial fibrillation, rate of 104.  Normal axis, normal intervals.  Poor R wave progression.  Normal ST segments and T waves.  No acute ischemic changes.  ____________________________________________    RADIOLOGY  CT Head Wo Contrast  Result Date: 11/16/2020 CLINICAL DATA:  Head trauma from a syncopal episode today. EXAM: CT HEAD WITHOUT CONTRAST CT CERVICAL SPINE WITHOUT  CONTRAST TECHNIQUE: Multidetector CT imaging of the head and cervical spine was performed following the standard protocol without intravenous contrast. Multiplanar CT image reconstructions of the cervical spine were also generated. COMPARISON:  Head CT dated 10/14/2020.  Chest CT dated 11/07/2020. FINDINGS: CT HEAD FINDINGS Brain: No significant change in mild-to-moderate enlargement of the ventricles and subarachnoid spaces. Mild-to-moderate patchy white matter low density in both cerebral hemispheres without significant change. No intracranial hemorrhage, mass lesion or CT evidence of acute infarction. Vascular: No hyperdense vessel or unexpected calcification. Skull: Normal. Negative for fracture or focal lesion. Sinuses/Orbits: Status post bilateral cataract extraction. Unremarkable bones and included paranasal sinuses. Other: Right concha bullosa. CT CERVICAL SPINE FINDINGS Alignment: Minimal anterolisthesis at the C5-6 and C7-T1 levels. Skull base and vertebrae: Small right C2 bone island.  No fractures. Soft tissues and spinal canal: No prevertebral fluid or swelling. No visible canal hematoma. Disc levels:  Mild multilevel degenerative changes. Upper chest: Stable previously described small left upper lobe pulmonary nodules. Other: Stable multiple small thyroid nodules measuring up to 1.0 cm in maximum diameter. Not clinically significant; no follow-up imaging recommended (ref: J Am Coll Radiol. 2015 Feb;12(2): 143-50).Mild bilateral carotid artery atheromatous calcification. IMPRESSION: 1. No skull fracture or intracranial hemorrhage. 2. No cervical spine fracture or traumatic subluxation. 3. Stable diffuse cerebral and cerebellar atrophy and chronic small vessel white matter ischemic changes. 4. Mild multilevel cervical spine degenerative changes. 5. Stable recently described left upper lobe lung nodules. Electronically Signed   By: Remo Lipps  Joneen Caraway M.D.   On: 11/16/2020 17:35   CT CERVICAL SPINE WO  CONTRAST  Result Date: 11/16/2020 CLINICAL DATA:  Head trauma from a syncopal episode today. EXAM: CT HEAD WITHOUT CONTRAST CT CERVICAL SPINE WITHOUT CONTRAST TECHNIQUE: Multidetector CT imaging of the head and cervical spine was performed following the standard protocol without intravenous contrast. Multiplanar CT image reconstructions of the cervical spine were also generated. COMPARISON:  Head CT dated 10/14/2020.  Chest CT dated 11/07/2020. FINDINGS: CT HEAD FINDINGS Brain: No significant change in mild-to-moderate enlargement of the ventricles and subarachnoid spaces. Mild-to-moderate patchy white matter low density in both cerebral hemispheres without significant change. No intracranial hemorrhage, mass lesion or CT evidence of acute infarction. Vascular: No hyperdense vessel or unexpected calcification. Skull: Normal. Negative for fracture or focal lesion. Sinuses/Orbits: Status post bilateral cataract extraction. Unremarkable bones and included paranasal sinuses. Other: Right concha bullosa. CT CERVICAL SPINE FINDINGS Alignment: Minimal anterolisthesis at the C5-6 and C7-T1 levels. Skull base and vertebrae: Small right C2 bone island.  No fractures. Soft tissues and spinal canal: No prevertebral fluid or swelling. No visible canal hematoma. Disc levels:  Mild multilevel degenerative changes. Upper chest: Stable previously described small left upper lobe pulmonary nodules. Other: Stable multiple small thyroid nodules measuring up to 1.0 cm in maximum diameter. Not clinically significant; no follow-up imaging recommended (ref: J Am Coll Radiol. 2015 Feb;12(2): 143-50).Mild bilateral carotid artery atheromatous calcification. IMPRESSION: 1. No skull fracture or intracranial hemorrhage. 2. No cervical spine fracture or traumatic subluxation. 3. Stable diffuse cerebral and cerebellar atrophy and chronic small vessel white matter ischemic changes. 4. Mild multilevel cervical spine degenerative changes. 5. Stable  recently described left upper lobe lung nodules. Electronically Signed   By: Claudie Revering M.D.   On: 11/16/2020 17:35   DG Chest Portable 1 View  Result Date: 11/16/2020 CLINICAL DATA:  Syncopal episode today.  Tachycardia. EXAM: PORTABLE CHEST 1 VIEW COMPARISON:  11/07/2020.  Abdomen and pelvis CT dated 01/11/2018. FINDINGS: Enlarged cardiac silhouette without significant change. Small hiatal hernia. Minimal bilateral linear scarring. Otherwise, clear lungs. Normal vascularity. Mild dextroconvex scoliosis. Cholecystectomy clips. Stable large calcified splenic mass. IMPRESSION: 1. No acute abnormality. 2. Stable cardiomegaly. 3. Stable large, chronic, calcified splenic mass. Electronically Signed   By: Claudie Revering M.D.   On: 11/16/2020 17:25    ____________________________________________   PROCEDURES Procedures  ____________________________________________  DIFFERENTIAL DIAGNOSIS   Intracranial hemorrhage, pneumonia, UTI, electrolyte abnormality, dehydration, non-STEMI  CLINICAL IMPRESSION / ASSESSMENT AND PLAN / ED COURSE  Medications ordered in the ED: Medications  sodium chloride 0.9 % bolus 500 mL (0 mLs Intravenous Stopped 11/16/20 1734)  cefTRIAXone (ROCEPHIN) 1 g in sodium chloride 0.9 % 100 mL IVPB (0 g Intravenous Stopped 11/16/20 1804)    Pertinent labs & imaging results that were available during my care of the patient were reviewed by me and considered in my medical decision making (see chart for details).   Jody [name] was evaluated in Emergency Department on 11/16/2020 for the symptoms described in the history of present illness. She was evaluated in the context of the global COVID-19 pandemic, which necessitated consideration that the patient might be at risk for infection with the SARS-CoV-2 virus that causes COVID-19. Institutional protocols and algorithms that pertain to the evaluation of patients at risk for COVID-19 are in a state of rapid change based on  information released by regulatory bodies including the CDC and federal and state organizations. These policies and algorithms were followed during the  patient's care in the ED.   Patient with dementia presents with syncope.  Vital signs unremarkable except for initial tachycardia, rate controlled with low-dose of metoprolol and IV fluids.  Appears dehydrated.  Patient given additional 500 mL in the ED for a total of 1 L bolus with normalization of heart rate, vital signs remain stable and normal.  Labs show evidence of cystitis.  Creatinine unremarkable, rest of the work-up reassuring.  CT head and neck and chest x-ray unremarkable.  Patient given a dose of Rocephin in the ED, will prescribe Keflex and Zofran as needed, follow-up with PCP.  Findings and plan of care discussed with husband at bedside who agrees.      ____________________________________________   FINAL CLINICAL IMPRESSION(S) / ED DIAGNOSES    Final diagnoses:  Cystitis  Dehydration  Dementia without behavioral disturbance, unspecified dementia type (HCC)  Atrial fibrillation, unspecified type ([place])  Chronic congestive heart failure, unspecified heart failure type [facility])     ED Discharge Orders          Ordered    cephALEXin (KEFLEX) 500 MG capsule  2 times daily        11/16/20 2126    ondansetron (ZOFRAN ODT) 4 MG disintegrating tablet  Every 8 hours PRN        11/16/20 2126            Portions of this note were generated with dragon dictation software. Dictation errors may occur despite best attempts at proofreading.   Carrie Mew, MD 11/16/20 2131

## 2020-11-16 NOTE — ED Triage Notes (Signed)
Pt BIBA from home for syncopal episode today. Husband states pt was using the bathroom and passed out. Denies injury. Hx of dementia and afib. Pt was seen x 1 week ago for same sx. Hr with EMS 140s. Pt given 2.5 metoprolol. HR 90s on arrival. BP 104/64.

## 2020-11-19 LAB — URINE CULTURE: Culture: >=100000 — AB

## 2020-11-27 ENCOUNTER — Encounter: Payer: Self-pay | Admitting: Internal Medicine

## 2020-11-27 ENCOUNTER — Ambulatory Visit: Payer: Medicare Other | Admitting: Certified Registered Nurse Anesthetist

## 2020-11-27 ENCOUNTER — Ambulatory Visit
Admission: RE | Admit: 2020-11-27 | Discharge: 2020-11-27 | Disposition: A | Discharge status: Home / Self Care | Payer: Medicare Other | Attending: Internal Medicine | Admitting: Internal Medicine

## 2020-11-27 ENCOUNTER — Encounter
Admission: RE | Disposition: A | Discharge status: Home / Self Care | Payer: Self-pay | Source: Home / Self Care | Attending: Internal Medicine

## 2020-11-27 ENCOUNTER — Other Ambulatory Visit: Payer: Self-pay

## 2020-11-27 DIAGNOSIS — E119 Type 2 diabetes mellitus without complications: Secondary | ICD-10-CM | POA: Diagnosis not present

## 2020-11-27 DIAGNOSIS — I480 Paroxysmal atrial fibrillation: Secondary | ICD-10-CM | POA: Diagnosis not present

## 2020-11-27 DIAGNOSIS — Z79899 Other long term (current) drug therapy: Secondary | ICD-10-CM | POA: Diagnosis not present

## 2020-11-27 HISTORY — PX: CARDIOVERSION: SHX1299

## 2020-11-27 HISTORY — DX: Acquired absence of other specified parts of digestive tract: Z90.49

## 2020-11-27 HISTORY — DX: Acquired absence of both cervix and uterus: Z90.710

## 2020-11-27 SURGERY — CARDIOVERSION
Anesthesia: General

## 2020-11-27 MED ORDER — PROPOFOL 10 MG/ML IV BOLUS
INTRAVENOUS | Status: AC
  Filled 2020-11-27: qty 20

## 2020-11-27 MED ORDER — PHENYLEPHRINE HCL (PRESSORS) 10 MG/ML IV SOLN
INTRAVENOUS | Status: AC
  Filled 2020-11-27: qty 1

## 2020-11-27 MED ORDER — PROPOFOL 10 MG/ML IV BOLUS
INTRAVENOUS | Status: DC | PRN
  Administered 2020-11-27: 50 mg via INTRAVENOUS

## 2020-11-27 MED ORDER — SODIUM CHLORIDE 0.9 % IV SOLN: INTRAVENOUS | Status: DC | PRN

## 2020-11-27 NOTE — Anesthesia Procedure Notes (Signed)
Date/Time: 11/27/2020 7:31 AM Performed by: Lily Peer, Hilton Saephan, CRNA Pre-anesthesia Checklist: Patient identified, Emergency Drugs available and Suction available Patient Re-evaluated:Patient Re-evaluated prior to induction Oxygen Delivery Method: Nasal cannula Induction Type: IV induction

## 2020-11-27 NOTE — Transfer of Care (Signed)
Immediate Anesthesia Transfer of Care Note  Patient: Jody [name]  Procedure(s) Performed: CARDIOVERSION  Patient Location: Cath Lab  Anesthesia Type:General  Level of Consciousness: awake and patient cooperative  Airway & Oxygen Therapy: Patient Spontanous Breathing and Patient connected to nasal cannula oxygen  Post-op Assessment: Report given to RN and Post -op Vital signs reviewed and stable  Post vital signs: Reviewed and stable  Last Vitals:  Vitals Value Taken Time  BP 102/66 11/27/20 0748  Temp    Pulse 78 11/27/20 0751  Resp 21 11/27/20 0751  SpO2 99 % 11/27/20 0751    Last Pain:  Vitals:   11/27/20 0659  PainSc: 0-No pain         Complications: No notable events documented.

## 2020-11-27 NOTE — Anesthesia Postprocedure Evaluation (Signed)
Anesthesia Post Note  Patient: Jody [name]  Procedure(s) Performed: CARDIOVERSION  Patient location during evaluation: [facility] Recovery Anesthesia Type: General Level of consciousness: awake and alert Pain management: pain level controlled Vital Signs Assessment: post-procedure vital signs reviewed and stable Respiratory status: spontaneous breathing, nonlabored ventilation, respiratory function stable and patient connected to nasal cannula oxygen Cardiovascular status: blood pressure returned to baseline and stable Postop Assessment: no apparent nausea or vomiting Anesthetic complications: no   No notable events documented.   Last Vitals:  Vitals:   11/27/20 0757 11/27/20 0800  BP: 110/64 98/65  Pulse: 80 82  Resp: (!) 21 (!) 32  Temp:    SpO2: 100% 100%    Last Pain:  Vitals:   11/27/20 0659  PainSc: 0-No pain                 Martha Clan

## 2020-11-27 NOTE — Anesthesia Preprocedure Evaluation (Signed)
Anesthesia Evaluation  Patient identified by MRN, date of birth, ID band Patient awake and Patient confused    Reviewed: Allergy & Precautions, H&P , NPO status , Patient's Chart, lab work & pertinent test results, reviewed documented beta blocker date and time   History of Anesthesia Complications Negative for: history of anesthetic complications  Airway Mallampati: II  TM Distance: >3 FB Neck ROM: full    Dental  (+) Teeth Intact, Dental Advidsory Given   Pulmonary neg pulmonary ROS, neg sleep apnea, neg COPD, Patient abstained from smoking.Not current smoker,   RUL lung cancer   Pulmonary exam normal breath sounds clear to auscultation       Cardiovascular Exercise Tolerance: Good METShypertension, (-) angina+CHF and + DOE  (-) CAD and (-) Past MI + dysrhythmias Atrial Fibrillation + Valvular Problems/Murmurs MR  Rhythm:regular Rate:Normal   TTE (12/2019): LVEF 40-45% WITH MILD LVH  MODERATE MR, TR  MILD AR, PR    Neuro/Psych PSYCHIATRIC DISORDERS Anxiety Depression Dementia negative neurological ROS     GI/Hepatic Neg liver ROS, GERD  Controlled, Diverticulosis c/b perforation s/p colostomy & reversal   Endo/Other  negative endocrine ROS  Renal/GU negative Renal ROS  negative genitourinary   Musculoskeletal   Abdominal   Peds  Hematology negative hematology ROS (+)   Anesthesia Other Findings Past Medical History: No date: Asthma No date: Atrial fibrillation (HCC) No date: CHF (congestive heart failure) (HCC) No date: Complication of anesthesia     Comment:  Slow to wake after colostomy No date: Dysrhythmia No date: GERD (gastroesophageal reflux disease) No date: History of cholecystectomy No date: History of hysterectomy No date: Hypertension No date: Memory difficulties 02/2005: Primary adenocarcinoma of right lung ([place])     Comment:  Rad tx's.  No date: Uses walker    Reproductive/Obstetrics negative OB ROS                             Anesthesia Physical  Anesthesia Plan  ASA: 3  Anesthesia Plan: General   Post-op Pain Management:    Induction: Intravenous  PONV Risk Score and Plan: 3 and Propofol infusion and TIVA  Airway Management Planned: Nasal Cannula  Additional Equipment: None  Intra-op Plan:   Post-operative Plan:   Informed Consent: I have reviewed the patients History and Physical, chart, labs and discussed the procedure including the risks, benefits and alternatives for the proposed anesthesia with the patient or authorized representative who has indicated his/her understanding and acceptance.     Dental Advisory Given  Plan Discussed with: Anesthesiologist, CRNA and Surgeon  Anesthesia Plan Comments: (Discussed risks of anesthesia with husband at bedside (Due to patient's dementia and lack of capacity to consent), including possibility of difficulty with spontaneous ventilation under anesthesia necessitating airway intervention, PONV, and rare risks such as cardiac or respiratory or neurological events. Husband understands.)        Anesthesia Quick Evaluation

## 2020-11-27 NOTE — CV Procedure (Signed)
Electrical Cardioversion Procedure Note Jody [name] [id number] Mar 24, 1941  Procedure: Electrical Cardioversion Indications:  Paroxysmal non valvular atrial fibrillation  Procedure Details Consent: Risks of procedure as well as the alternatives and risks of each were explained to the (patient/caregiver).  Consent for procedure obtained. Time Out: Verified patient identification, verified procedure, site/side was marked, verified correct patient position, special equipment/implants available, medications/allergies/relevent history reviewed, required imaging and test results available.  Performed  Patient placed on cardiac monitor, pulse oximetry, supplemental oxygen as necessary.  Sedation given: Propofol and versed as per anesthesia  Pacer pads placed anterior and posterior chest.  Cardioverted 1 time(s).  Cardioverted at 120J.  Evaluation Findings: Post procedure EKG shows: NSR Complications: None Patient did tolerate procedure well.   [name] M.D. [facility] 11/27/2020, 7:44 AM

## 2020-12-04 ENCOUNTER — Ambulatory Visit: Payer: Medicare Other | Admitting: Internal Medicine

## 2021-01-05 DEATH — deceased
